# Patient Record
Sex: Female | Born: 1940
Health system: Southern US, Community
[De-identification: ages and names within clinical notes are randomized; demographics above are authoritative.]

## PROBLEM LIST (undated history)

## (undated) DIAGNOSIS — L439 Lichen planus, unspecified: Secondary | ICD-10-CM

## (undated) DIAGNOSIS — I4891 Unspecified atrial fibrillation: Secondary | ICD-10-CM

## (undated) DIAGNOSIS — E785 Hyperlipidemia, unspecified: Secondary | ICD-10-CM

## (undated) DIAGNOSIS — Z8701 Personal history of pneumonia (recurrent): Secondary | ICD-10-CM

## (undated) DIAGNOSIS — I071 Rheumatic tricuspid insufficiency: Secondary | ICD-10-CM

## (undated) DIAGNOSIS — K589 Irritable bowel syndrome without diarrhea: Secondary | ICD-10-CM

## (undated) DIAGNOSIS — N6019 Diffuse cystic mastopathy of unspecified breast: Secondary | ICD-10-CM

## (undated) DIAGNOSIS — I340 Nonrheumatic mitral (valve) insufficiency: Secondary | ICD-10-CM

## (undated) DIAGNOSIS — C801 Malignant (primary) neoplasm, unspecified: Secondary | ICD-10-CM

## (undated) HISTORY — DX: Irritable bowel syndrome, unspecified: K58.9

## (undated) HISTORY — DX: Personal history of pneumonia (recurrent): Z87.01

## (undated) HISTORY — DX: Nonrheumatic mitral (valve) insufficiency: I34.0

## (undated) HISTORY — DX: Lichen planus, unspecified: L43.9

## (undated) HISTORY — PX: ABDOMINAL HYSTERECTOMY: SHX81

## (undated) HISTORY — PX: OTHER SURGICAL HISTORY: SHX169

## (undated) HISTORY — PX: MAXILLARY SINUS LIFT: SHX5206

## (undated) HISTORY — DX: Rheumatic tricuspid insufficiency: I07.1

## (undated) HISTORY — PX: BREAST SURGERY: SHX581

## (undated) HISTORY — DX: Diffuse cystic mastopathy of unspecified breast: N60.19

## (undated) HISTORY — DX: Malignant (primary) neoplasm, unspecified: C80.1

## (undated) HISTORY — DX: Hyperlipidemia, unspecified: E78.5

---

## 1997-11-04 DIAGNOSIS — Z8701 Personal history of pneumonia (recurrent): Secondary | ICD-10-CM

## 1997-11-04 HISTORY — DX: Personal history of pneumonia (recurrent): Z87.01

## 2005-03-15 ENCOUNTER — Ambulatory Visit: Payer: Self-pay | Admitting: Unknown Physician Specialty

## 2006-03-20 ENCOUNTER — Ambulatory Visit: Payer: Self-pay | Admitting: Unknown Physician Specialty

## 2006-04-07 ENCOUNTER — Ambulatory Visit: Payer: Self-pay | Admitting: Unknown Physician Specialty

## 2006-05-08 ENCOUNTER — Ambulatory Visit: Payer: Self-pay | Admitting: Surgery

## 2006-12-12 ENCOUNTER — Ambulatory Visit: Payer: Self-pay | Admitting: Surgery

## 2007-04-13 ENCOUNTER — Ambulatory Visit: Payer: Self-pay | Admitting: Unknown Physician Specialty

## 2007-06-16 ENCOUNTER — Ambulatory Visit: Payer: Self-pay | Admitting: Unknown Physician Specialty

## 2007-08-21 ENCOUNTER — Ambulatory Visit: Payer: Self-pay | Admitting: Ophthalmology

## 2008-03-03 ENCOUNTER — Ambulatory Visit: Payer: Self-pay | Admitting: Unknown Physician Specialty

## 2008-04-13 ENCOUNTER — Ambulatory Visit: Payer: Self-pay | Admitting: Unknown Physician Specialty

## 2009-02-06 ENCOUNTER — Ambulatory Visit: Payer: Self-pay | Admitting: Unknown Physician Specialty

## 2009-04-18 ENCOUNTER — Ambulatory Visit: Payer: Self-pay | Admitting: Unknown Physician Specialty

## 2009-10-12 ENCOUNTER — Ambulatory Visit: Payer: Self-pay | Admitting: Unknown Physician Specialty

## 2010-06-08 ENCOUNTER — Ambulatory Visit: Payer: Self-pay | Admitting: Unknown Physician Specialty

## 2010-11-04 DIAGNOSIS — I071 Rheumatic tricuspid insufficiency: Secondary | ICD-10-CM

## 2010-11-04 DIAGNOSIS — I340 Nonrheumatic mitral (valve) insufficiency: Secondary | ICD-10-CM

## 2010-11-04 HISTORY — DX: Nonrheumatic mitral (valve) insufficiency: I34.0

## 2010-11-04 HISTORY — DX: Rheumatic tricuspid insufficiency: I07.1

## 2011-02-12 LAB — HM DEXA SCAN: HM Dexa Scan: NORMAL

## 2011-06-19 ENCOUNTER — Ambulatory Visit: Payer: Self-pay | Admitting: Unknown Physician Specialty

## 2011-06-19 LAB — HM MAMMOGRAPHY: HM Mammogram: NORMAL

## 2011-10-10 DIAGNOSIS — I341 Nonrheumatic mitral (valve) prolapse: Secondary | ICD-10-CM | POA: Insufficient documentation

## 2012-02-12 ENCOUNTER — Encounter: Payer: Self-pay | Admitting: Internal Medicine

## 2012-02-12 ENCOUNTER — Ambulatory Visit (INDEPENDENT_AMBULATORY_CARE_PROVIDER_SITE_OTHER): Payer: Medicare Other | Admitting: Internal Medicine

## 2012-02-12 VITALS — BP 122/68 | HR 88 | Temp 97.9°F | Resp 16 | Ht 66.5 in | Wt 169.2 lb

## 2012-02-12 DIAGNOSIS — J45909 Unspecified asthma, uncomplicated: Secondary | ICD-10-CM

## 2012-02-12 DIAGNOSIS — I4891 Unspecified atrial fibrillation: Secondary | ICD-10-CM | POA: Insufficient documentation

## 2012-02-12 DIAGNOSIS — Z1239 Encounter for other screening for malignant neoplasm of breast: Secondary | ICD-10-CM

## 2012-02-12 DIAGNOSIS — R042 Hemoptysis: Secondary | ICD-10-CM | POA: Insufficient documentation

## 2012-02-12 NOTE — Progress Notes (Signed)
Patient ID: Carol [name], female   DOB: 1941/02/10, 71 y.o.   MRN: [id number]

## 2012-02-12 NOTE — Assessment & Plan Note (Signed)
Triggered by perfume. Smoke, but not pollen.  Use of ventolin has triggered a rapid  hr in the recent past .  Treated by the past by [facility], then by [facility] again because her son the pharamacist suggested a second opinion. Dr. Jimmye Norman, then Dr. Rhys Martini

## 2012-02-12 NOTE — Assessment & Plan Note (Signed)
Will check pt/ inr today and send to [facility] ,.  episode resolved.  Advised to flush sinuses the next time it occurs.

## 2012-02-12 NOTE — Patient Instructions (Signed)
Return in  6 months for your physical.  You may come earlyier for your fasting labs if you would like   We will send your PT to Dr. Gwen Pounds

## 2012-02-13 ENCOUNTER — Encounter: Payer: Self-pay | Admitting: Internal Medicine

## 2012-02-13 LAB — PROTIME-INR: INR: 2.62 — ABNORMAL HIGH (ref <1.50)

## 2012-02-13 NOTE — Assessment & Plan Note (Signed)
And a control anticoagulate with Coumadin. Protime rechecked today given her recent episodes of hemoptysis and was therapeutic. Results will be sent to Dr. Vic Ripper. for management.

## 2012-02-13 NOTE — Progress Notes (Signed)
Patient ID: COLLINS [name], female   DOB: Aug 05, 1941, 71 y.o.   MRN: [id number]  Patient Active Problem List  Diagnoses  . Asthma, extrinsic  . Hemoptysis  . Atrial fibrillation    Subjective:  CC:   Chief Complaint  Patient presents with  . New Patient    HPI:   Carol Batty Thompsonis a 71 y.o. female who presents here to Establish primary care.  Transferring  from [employer]. She has a 7 yr history of atrial fibrillation.  Anticoagulation  and has been difficult was but her son is a Teacher, early years/pre and has strongly advised against Pradaxa for unclear reasons. .  Her son is actively involved in her care.  Pulse is rate controlled with flecainide and diltiazem .  Dr. Lolly Mustache is her EP at [name] , controlled for the past 8 years. Due for a stress test soon ,  To be ordered locally by Carol [name].   Had several episdes of scant hemoptysis which were self limiting .   uses a saline nasal irrigant once daily    History reviewed. No pertinent past medical history.  Past Surgical History  Procedure Date  . Maxillary sinus lift 1990's    Carol [name]  . Abdominal hysterectomy     precancerous cervix,    . Oophorectomy   . Breast surgery     bilateral, benign biopsies         The following portions of the patient's history were reviewed and updated as appropriate: Allergies, current medications, and problem list.    Review of Systems:   12 Pt  review of systems was negative except those addressed in the HPI,     History   Social History  . Marital Status: Single    Spouse Name: N/A    Number of Children: N/A  . Years of Education: N/A   Occupational History  . Not on file.   Social History Main Topics  . Smoking status: Never Smoker   . Smokeless tobacco: Never Used  . Alcohol Use: 2.4 oz/week    4 Glasses of wine per week  . Drug Use: No  . Sexually Active: Not on file   Other Topics Concern  . Not on file   Social History Narrative  . No narrative on  file    Objective:  BP 122/68  Pulse 88  Temp(Src) 97.9 F (36.6 C) (Oral)  Resp 16  Ht 5' 6.5" (1.689 m)  Wt 169 lb 4 oz (76.771 kg)  BMI 26.91 kg/m2  SpO2 96%  General appearance: alert, cooperative and appears stated age Ears: normal TM's and external ear canals both ears Throat: lips, mucosa, and tongue normal; teeth and gums normal Neck: no adenopathy, no carotid bruit, supple, symmetrical, trachea midline and thyroid not enlarged, symmetric, no tenderness/mass/nodules Back: symmetric, no curvature. ROM normal. No CVA tenderness. Lungs: clear to auscultation bilaterally Heart: regular rate and rhythm, S1, S2 normal, no murmur, click, rub or gallop Abdomen: soft, non-tender; bowel sounds normal; no masses,  no organomegaly Pulses: 2+ and symmetric Skin: Skin color, texture, turgor normal. No rashes or lesions Lymph nodes: Cervical, supraclavicular, and axillary nodes normal.  Assessment and Plan:  Asthma, extrinsic Triggered by perfume. Smoke, but not pollen.  Use of ventolin has triggered a rapid  hr in the recent past .  Treated by the past by Carol [name], then by Carol [name] again because her son the pharamacist suggested a second opinion. Dr. Jimmye [name], then Dr. Rhys [name]  Hemoptysis Will  check pt/ inr today and send to [name] ,.  episode resolved.  Advised to flush sinuses the next time it occurs.   Atrial fibrillation And a control anticoagulate with Coumadin. Protime rechecked today given her recent episodes of hemoptysis and was therapeutic. Results will be sent to Dr. Vic [name]. for management.    Updated Medication List Outpatient Encounter Prescriptions as of 02/12/2012  Medication Sig Dispense Refill  . albuterol (PROVENTIL HFA;VENTOLIN HFA) 108 (90 BASE) MCG/ACT inhaler Inhale 2 puffs into the lungs every 6 (six) hours as needed.      . B Complex-C (SUPER B COMPLEX PO) Take by mouth.      . Bisacodyl (LAXATIVE PO) Take by mouth.      . Calcium Carbonate-Vitamin D (CALCIUM +  D) 600-200 MG-UNIT TABS Take by mouth.      . diltiazem (CARDIZEM CD) 180 MG 24 hr capsule Take 180 mg by mouth daily.      Marland Kitchen diltiazem (CARDIZEM) 60 MG tablet Take 60 mg by mouth as needed.      . dimenhyDRINATE (DRAMAMINE) 50 MG tablet Take 50 mg by mouth every 8 (eight) hours as needed.      . diphenhydrAMINE (BENADRYL) 25 mg capsule Take 25 mg by mouth every 6 (six) hours as needed.      Marland Kitchen estradiol (ESTRACE) 1 MG tablet Take 1 mg by mouth daily.      . Flaxseed, Linseed, (FLAXSEED OIL PO) Take by mouth.      . flecainide (TAMBOCOR) 100 MG tablet Take 100 mg by mouth 2 (two) times daily.      . Glucosamine-Chondroit-Vit C-Mn (GLUCOSAMINE 1500 COMPLEX PO) Take by mouth.      Marland Kitchen guaiFENesin (MUCINEX) 600 MG 12 hr tablet Take 1,200 mg by mouth as needed.      Marland Kitchen ibuprofen (ADVIL,MOTRIN) 200 MG tablet Take 200 mg by mouth every 6 (six) hours as needed.      . metoprolol succinate (TOPROL-XL) 25 MG 24 hr tablet Take 25 mg by mouth daily.      . Multiple Vitamin (MULTIVITAMIN) tablet Take 1 tablet by mouth daily.      Marland Kitchen omeprazole (PRILOSEC) 20 MG capsule Take 20 mg by mouth daily.      . vitamin E 400 UNIT capsule Take 400 Units by mouth 2 (two) times daily.      Marland Kitchen warfarin (COUMADIN) 1 MG tablet Take 1 mg by mouth as directed. Patient takes 7 mg daily      . warfarin (COUMADIN) 6 MG tablet Take 6 mg by mouth daily.         Orders Placed This Encounter  Procedures  . MyChart Weight Flowsheet  . HM MAMMOGRAPHY  . HM DEXA SCAN  . MM Digital Screening  . Protime-INR  . INR/PT  . INR/PT  . HM COLONOSCOPY    No Follow-up on file.

## 2012-03-10 ENCOUNTER — Telehealth: Payer: Self-pay | Admitting: Internal Medicine
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
## 2012-03-11 ENCOUNTER — Encounter: Payer: Self-pay | Admitting: Internal Medicine

## 2012-03-11 ENCOUNTER — Ambulatory Visit (INDEPENDENT_AMBULATORY_CARE_PROVIDER_SITE_OTHER): Payer: Medicare Other | Admitting: Internal Medicine

## 2012-03-11 VITALS — BP 126/72 | HR 80 | Temp 98.3°F | Resp 18 | Wt 168.8 lb

## 2012-03-11 DIAGNOSIS — J45901 Unspecified asthma with (acute) exacerbation: Secondary | ICD-10-CM

## 2012-03-11 MED ORDER — AMOXICILLIN-POT CLAVULANATE 875-125 MG PO TABS: 1.0000 | ORAL_TABLET | Freq: Two times a day (BID) | ORAL | Status: AC

## 2012-03-11 MED ORDER — LEVALBUTEROL TARTRATE 45 MCG/ACT IN AERO: 1.0000 | INHALATION_SPRAY | RESPIRATORY_TRACT | Status: DC | PRN

## 2012-03-11 MED ORDER — HYDROCOD POLST-CHLORPHEN POLST 10-8 MG/5ML PO LQCR: 5.0000 mL | Freq: Every evening | ORAL | Status: DC | PRN

## 2012-03-11 MED ORDER — BUDESONIDE 180 MCG/ACT IN AEPB: 2.0000 | INHALATION_SPRAY | Freq: Two times a day (BID) | RESPIRATORY_TRACT | Status: DC
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
## 2012-03-11 NOTE — Assessment & Plan Note (Addendum)
No wheezing on exam .  Refuses prednisone .  Will rx xopenex,  Augmentin, cough suppressant. I am concerned that she may have bronchiectasis as a cause for her recurrent purulent bronchitis.  Ordering CT

## 2012-03-11 NOTE — Patient Instructions (Signed)
I am treating you with augmentin and pulmicort,  A steroid inhaler to manage the inflammation in your lungs.    I am ordering a CT of your chest to rule out a condition called bronchiectasis  I am also ordering full pulmonary  function tests once you are better.

## 2012-03-12 ENCOUNTER — Telehealth: Payer: Self-pay | Admitting: Internal Medicine

## 2012-03-12 ENCOUNTER — Encounter: Payer: Self-pay | Admitting: Internal Medicine

## 2012-03-12 MED ORDER — HYDROCOD POLST-CHLORPHEN POLST 10-8 MG/5ML PO LQCR: 5.0000 mL | Freq: Every evening | ORAL | Status: DC | PRN

## 2012-03-12 NOTE — Telephone Encounter (Signed)
[phone] Pt called she did not receive the cough med that dr Darrick Huntsman sent in yesterday [facility] on [address]

## 2012-03-12 NOTE — Telephone Encounter (Signed)
Rx called in, patient notified.

## 2012-03-13 ENCOUNTER — Encounter: Payer: Self-pay | Admitting: Internal Medicine

## 2012-03-13 DIAGNOSIS — J329 Chronic sinusitis, unspecified: Secondary | ICD-10-CM | POA: Insufficient documentation

## 2012-03-13 DIAGNOSIS — L439 Lichen planus, unspecified: Secondary | ICD-10-CM | POA: Insufficient documentation

## 2012-03-13 DIAGNOSIS — K589 Irritable bowel syndrome without diarrhea: Secondary | ICD-10-CM | POA: Insufficient documentation

## 2012-03-13 DIAGNOSIS — E785 Hyperlipidemia, unspecified: Secondary | ICD-10-CM | POA: Insufficient documentation

## 2012-03-13 DIAGNOSIS — Z8701 Personal history of pneumonia (recurrent): Secondary | ICD-10-CM | POA: Insufficient documentation

## 2012-03-13 DIAGNOSIS — N6019 Diffuse cystic mastopathy of unspecified breast: Secondary | ICD-10-CM | POA: Insufficient documentation

## 2012-03-13 DIAGNOSIS — I071 Rheumatic tricuspid insufficiency: Secondary | ICD-10-CM | POA: Insufficient documentation

## 2012-03-13 DIAGNOSIS — I340 Nonrheumatic mitral (valve) insufficiency: Secondary | ICD-10-CM | POA: Insufficient documentation

## 2012-03-18 ENCOUNTER — Ambulatory Visit: Payer: Self-pay | Admitting: Internal Medicine

## 2012-03-18 LAB — CREATININE, SERUM
Creatinine: 0.76 mg/dL (ref 0.60–1.30)
EGFR (African American): >60
EGFR (Non-African Amer.): >60

## 2012-03-20 ENCOUNTER — Telehealth: Payer: Self-pay | Admitting: Internal Medicine

## 2012-03-20 NOTE — Telephone Encounter (Signed)
Her chest CT did not show bronchiectasis ("bronky-ek tuh sis", or enlarged bronchial tubes) but she does have findings consistent with pneumonia.  Is she feeling better yet on the Augmentin?

## 2012-03-20 NOTE — Telephone Encounter (Signed)
Patient notified, she stated she is not feeling better and she gets winded easily.  She stated she had to stop using the inhaler you prescribed because she did not feel right when she used it.  She also said she is now has a productive cough and feels like it is breaking loose in her chest when she coughs

## 2012-03-20 NOTE — Telephone Encounter (Signed)
Please make her an appt for follow up this week

## 2012-03-24 NOTE — Telephone Encounter (Signed)
Patient is coming in on Friday.  

## 2012-03-25 ENCOUNTER — Encounter: Payer: Self-pay | Admitting: Internal Medicine

## 2012-03-27 ENCOUNTER — Ambulatory Visit (INDEPENDENT_AMBULATORY_CARE_PROVIDER_SITE_OTHER): Payer: Medicare Other | Admitting: Internal Medicine

## 2012-03-27 ENCOUNTER — Encounter: Payer: Self-pay | Admitting: Internal Medicine

## 2012-03-27 ENCOUNTER — Telehealth: Payer: Self-pay | Admitting: *Deleted

## 2012-03-27 VITALS — BP 102/76 | HR 114 | Temp 98.3°F | Resp 16 | Wt 169.8 lb

## 2012-03-27 DIAGNOSIS — Z79899 Other long term (current) drug therapy: Secondary | ICD-10-CM

## 2012-03-27 DIAGNOSIS — J45901 Unspecified asthma with (acute) exacerbation: Secondary | ICD-10-CM

## 2012-03-27 LAB — BASIC METABOLIC PANEL
BUN: 15 mg/dL (ref 6–23)
CO2: 24 mEq/L (ref 19–32)
Chloride: 108 mEq/L (ref 96–112)
Creatinine, Ser: 0.8 mg/dL (ref 0.4–1.2)
Glucose, Bld: 86 mg/dL (ref 70–99)
Potassium: 4.5 mEq/L (ref 3.5–5.1)

## 2012-03-27 MED ORDER — FUROSEMIDE 20 MG PO TABS: 20.0000 mg | ORAL_TABLET | Freq: Every day | ORAL | Status: DC

## 2012-03-27 NOTE — Telephone Encounter (Signed)
Message copied by Regis Bill on Fri Mar 27, 2012  5:12 PM ------      Message from: Duncan Dull      Created: Fri Mar 27, 2012  3:25 PM       Her potassium level is normal.  She should not need a potassium supplement for just a few days of furosemide,  Have her drink a glass of orange juice daily instead.
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
## 2012-03-27 NOTE — Patient Instructions (Signed)
Take an extra 60 mg of diltiazem every 12 hours until HR starts to stay < 100.  I am starting a low dose of furosemide , 20 mg daily in the morning,  To treat the fluid retention .  Take as needed

## 2012-03-27 NOTE — Telephone Encounter (Signed)
LMOM to inform patient w/contact name & number/SLS 
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
## 2012-04-15 ENCOUNTER — Telehealth: Payer: Self-pay | Admitting: Internal Medicine

## 2012-04-15 DIAGNOSIS — IMO0001 Reserved for inherently not codable concepts without codable children: Secondary | ICD-10-CM

## 2012-04-15 MED ORDER — LEVALBUTEROL TARTRATE 45 MCG/ACT IN AERO: 1.0000 | INHALATION_SPRAY | RESPIRATORY_TRACT | Status: DC | PRN

## 2012-04-15 NOTE — Telephone Encounter (Signed)
Caller: Rechy/Patient; PCP: Duncan Dull; CB#: [phone]; ; ; Call regarding Still Having SOB; onset of difficulty breathing 04/13/12.  Treated for bronchitis with Augmentin, and had CT scan a month ago.  Recently started on lasix  and pulmicort.  Feels that the lasix is not helpful.  Taking detiazem BID.  Has less reserve and cannot tolerate activity.  Cardiologist told her if it were her heart, the lasix would make a difference in her SOB, and because that continues, she beleives it may be respiratory instead.  Albuterol does help; last ventolin puffs x 2 1130 04/15/12.  Mild dry cough, but occasionally coughs up green mucus.  Albuterol is very helpful; however, it "makes her heart kick up, and then I have to take an extra cardizem regular" (takes cardizem XR at night).  Per protocol, disposition "call provider immediately"; no appts available per Epic.  INFO TO OFFICE FOR PROVIDER REVIEW/RX/CALLBACK.  WILL REQUIRE REFILL FOR VENTOLIN INHALER; DID DISCUSS XOPENEX BUT HAS DECLINED XOPENEX IN THE PAST WHEN TALKING ABOUT IT WITH DR  Darrick Huntsman.  PATIENT ALSO REQUESTS SENDING RECORDS OF RECENT TREATMENT PAST 6 MONTHS FAXED TO DR Smitty Cords [place]/[facility]IOLOG[facility].   MAY REACH PATIENT [phone].

## 2012-04-15 NOTE — Telephone Encounter (Signed)
Please inform Carol [name] that it is time for her to see a pulmonologist and I wil make referral to dr Kendrick Fries.,  In the meantime  She needs to sqitch to xopenex fro albterol because it will not make her heart race as much as the albuterol.  If dr walker cannot see her todayand if dr Kendrick Fries can't see her today she should be advised to go to urgent care since I am on vacation.  i will call the xopenex in to her pharmacy and put the referral in epic

## 2012-04-16 ENCOUNTER — Ambulatory Visit (INDEPENDENT_AMBULATORY_CARE_PROVIDER_SITE_OTHER): Payer: Medicare Other | Admitting: Pulmonary Disease

## 2012-04-16 ENCOUNTER — Encounter: Payer: Self-pay | Admitting: Pulmonary Disease

## 2012-04-16 VITALS — BP 124/86 | HR 131 | Temp 97.9°F | Ht 66.5 in | Wt 173.8 lb

## 2012-04-16 DIAGNOSIS — R0600 Dyspnea, unspecified: Secondary | ICD-10-CM

## 2012-04-16 DIAGNOSIS — R9389 Abnormal findings on diagnostic imaging of other specified body structures: Secondary | ICD-10-CM

## 2012-04-16 DIAGNOSIS — J45909 Unspecified asthma, uncomplicated: Secondary | ICD-10-CM

## 2012-04-16 DIAGNOSIS — R0602 Shortness of breath: Secondary | ICD-10-CM

## 2012-04-16 DIAGNOSIS — N6019 Diffuse cystic mastopathy of unspecified breast: Secondary | ICD-10-CM

## 2012-04-16 DIAGNOSIS — J349 Unspecified disorder of nose and nasal sinuses: Secondary | ICD-10-CM

## 2012-04-16 DIAGNOSIS — J329 Chronic sinusitis, unspecified: Secondary | ICD-10-CM

## 2012-04-16 DIAGNOSIS — R0609 Other forms of dyspnea: Secondary | ICD-10-CM

## 2012-04-16 DIAGNOSIS — I4891 Unspecified atrial fibrillation: Secondary | ICD-10-CM

## 2012-04-16 MED ORDER — DOXYCYCLINE HYCLATE 100 MG PO TABS: 100.0000 mg | ORAL_TABLET | Freq: Two times a day (BID) | ORAL | Status: AC
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
## 2012-04-16 NOTE — Assessment & Plan Note (Signed)
I think that Carol [name]'s ongoing daily sinus disease is contributing to her ongoing cough.  Because she has a complicated sinus history with prior fungal infections and surgeries as well as chronic daily sinusitis, I recommended that she see an ENT physician.  She requested that she see Dr. McQueen so we'll send the referral.  In the meantime I have recommended that she continue her saline rinses as she is doing. 

## 2012-04-16 NOTE — Patient Instructions (Addendum)
Take the doxycycline twice a day for 14 days for the cough and shortness of breath If you still have cough and sputum production after taking the doxycycline in ten days then start using the QVar two puffs twice a day We will send a referral to Dr. Mikey Bussing office for your sinus disease We will send you for a CT scan of the lungs and pulmonary function testing at [facility] after you complete the antibiotics. Follow up with Dr. Myles Rosenthal office tomorrow regarding your atrial fibrillation. We will see you back in four weeks, OK to overbook.

## 2012-04-16 NOTE — Assessment & Plan Note (Signed)
I suspect that her shortness of breath is due to an exacerbation of her underlying airways disease and the fact that her atrial fibrillation has been out of control since developing the infection.  She is not volume overloaded on exam today, but it is likely that the rapid heart rate is contributing.  See below.

## 2012-04-16 NOTE — Assessment & Plan Note (Deleted)
I think that Ms. Asfour's ongoing daily sinus disease is contributing to her ongoing cough.  Because she has a complicated sinus history with prior fungal infections and surgeries as well as chronic daily sinusitis, I recommended that she see an ENT physician.  She requested that she see Dr. Jenne Campus so we'll send the referral.  In the meantime I have recommended that she continue her saline rinses as she is doing.
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
## 2012-04-16 NOTE — Assessment & Plan Note (Signed)
Carol [name] CT chest performed at [facility] in 03/18/2012 was markedly abnormal.  I am hopeful that these findings represent pneumonia, however it is worrisome to me that they were still present after completing antibiotics.  DDx considerations include atypical infections (fungal, resistant bacterial) vs. inflammatory lung disease (NSIP, eosinophilic pneumonia, organizing pneumonia) vs. less likely a primary lung malignancy.  She does not have fever or chills to suggest ongoing pneumonia.  I will treat her with doxycycline for two weeks and then repeat a chest CT afterwards.  Hopefully we will see improvement.  If not she will need a bronchoscopy with BAL and possibly biopsy vs. EBUS approach to her mediastinal lymphadenopathy.

## 2012-04-16 NOTE — Assessment & Plan Note (Addendum)
Her atrial fibrillation was not well controlled in the office today as her heart rate was 130.  She tells me that this often occurs on nearly a daily basis, particularly when she is sick and that she has been instructed to use immediate release diltiazem to control it.  She states that she has dealt with it for so long that she rarely if ever requires hospitalization for these episodes.  I think that it could be contributing somewhat to her shortness of breath, but she is not volume up and it is more likely that the pulmonary issues are more to blame.  Regardless, she needs to discuss her elevated HR with Dr. Avelino Leeds and she says that she will be at his office in the morning.  I instructed her to take one of her immediate release tablets now in clinic.

## 2012-04-16 NOTE — Telephone Encounter (Signed)
Patient notified, she has an appt with Dr. Kendrick Fries this afternoon.  She wants to hold off on switching inhalers until she talks to Dr. Kendrick Fries because she does not think the albuterol is making her heart race.

## 2012-04-30 ENCOUNTER — Ambulatory Visit: Payer: Self-pay | Admitting: Internal Medicine

## 2012-05-05 ENCOUNTER — Telehealth: Payer: Self-pay | Admitting: Pulmonary Disease

## 2012-05-05 NOTE — Telephone Encounter (Signed)
Please advise PCC, this was ordered on 04/16/12. thanks

## 2012-05-05 NOTE — Telephone Encounter (Signed)
Returned [employer] from [facility]. Unable to reach her, but left message on voice mail that I did re-fax this order for her CT Chest without contrast to [phone]. Advised Melissa that if she did not receive order to please return my call to [phone]. CT has been approved by patient's insurance.  Confirmation received that faxed went through. Rhonda J Cobb

## 2012-05-06 ENCOUNTER — Ambulatory Visit: Payer: Self-pay | Admitting: Pulmonary Disease

## 2012-05-06 LAB — PULMONARY FUNCTION TEST

## 2012-05-11 ENCOUNTER — Telehealth: Payer: Self-pay | Admitting: *Deleted

## 2012-05-11 ENCOUNTER — Encounter: Payer: Self-pay | Admitting: Pulmonary Disease

## 2012-05-11 NOTE — Telephone Encounter (Signed)
Per Dr. Kendrick Fries, her PFT's are normal too.  Spoke with pt and notified of her results.  Per Dr. Kendrick Fries, no pulmonary followup needed. Pt aware and verbalized understanding.

## 2012-05-11 NOTE — Telephone Encounter (Signed)
Message copied by Christen Butter on Mon May 11, 2012  5:15 PM ------      Message from: Veto Kemps B      Created: Mon May 11, 2012  5:12 PM       L,            Let's let ms. Kress know that her CT scan was remarkably improved compared to her prior study.  In fact it was nearly completely normal.            B

## 2012-05-15 ENCOUNTER — Ambulatory Visit: Payer: Medicare Other | Admitting: Pulmonary Disease

## 2012-05-19 ENCOUNTER — Encounter: Payer: Self-pay | Admitting: Pulmonary Disease

## 2012-06-04 DIAGNOSIS — C801 Malignant (primary) neoplasm, unspecified: Secondary | ICD-10-CM | POA: Insufficient documentation

## 2012-06-04 HISTORY — DX: Malignant (primary) neoplasm, unspecified: C80.1

## 2012-06-04 HISTORY — PX: ABLATION OF DYSRHYTHMIC FOCUS: SHX254

## 2012-06-15 DIAGNOSIS — J449 Chronic obstructive pulmonary disease, unspecified: Secondary | ICD-10-CM | POA: Insufficient documentation

## 2012-07-17 ENCOUNTER — Ambulatory Visit: Payer: Self-pay | Admitting: Unknown Physician Specialty

## 2012-08-06 ENCOUNTER — Telehealth: Payer: Self-pay | Admitting: *Deleted

## 2012-08-06 ENCOUNTER — Other Ambulatory Visit: Payer: Medicare Other

## 2012-08-06 DIAGNOSIS — J45909 Unspecified asthma, uncomplicated: Secondary | ICD-10-CM

## 2012-08-06 NOTE — Telephone Encounter (Signed)
fasting lipids, TSH, CBC w diff and CMEt . Does she know that Medicare will not pay for labs done BEFORE her physical?

## 2012-08-06 NOTE — Telephone Encounter (Signed)
Patient came in today for CPX labs, what labs would you like ordered for this patient?

## 2012-08-06 NOTE — Telephone Encounter (Signed)
Patient was advised via telephone and she stated that she will wait to have labs done at appt.

## 2012-08-12 DIAGNOSIS — C6960 Malignant neoplasm of unspecified orbit: Secondary | ICD-10-CM | POA: Insufficient documentation

## 2012-08-13 ENCOUNTER — Ambulatory Visit (INDEPENDENT_AMBULATORY_CARE_PROVIDER_SITE_OTHER): Payer: Medicare Other | Admitting: Internal Medicine

## 2012-08-13 ENCOUNTER — Encounter: Payer: Self-pay | Admitting: Internal Medicine

## 2012-08-13 VITALS — BP 118/62 | HR 82 | Temp 97.9°F | Ht 66.5 in | Wt 174.5 lb

## 2012-08-13 DIAGNOSIS — C8591 Non-Hodgkin lymphoma, unspecified, lymph nodes of head, face, and neck: Secondary | ICD-10-CM

## 2012-08-13 DIAGNOSIS — Z0000 Encounter for general adult medical examination without abnormal findings: Secondary | ICD-10-CM

## 2012-08-13 DIAGNOSIS — I4891 Unspecified atrial fibrillation: Secondary | ICD-10-CM

## 2012-08-13 MED ORDER — ESTRADIOL 1 MG PO TABS: 1.0000 mg | ORAL_TABLET | Freq: Every day | ORAL | Status: DC

## 2012-08-13 MED ORDER — LEVALBUTEROL TARTRATE 45 MCG/ACT IN AERO: 1.0000 | INHALATION_SPRAY | RESPIRATORY_TRACT | Status: DC | PRN

## 2012-08-13 MED ORDER — DILTIAZEM HCL ER COATED BEADS 180 MG PO CP24: 180.0000 mg | ORAL_CAPSULE | Freq: Every day | ORAL | Status: DC

## 2012-08-13 NOTE — Progress Notes (Addendum)
Patient ID: Carol [name], female   DOB: 1941-06-21, 71 y.o.   MRN: [id number] The patient is here for annual Medicare wellness examination and management of other chronic and acute problems.    She has had multiple medical interventiins since last visit.  She had a cardiac ablation for uncontolled atrial fibrillation which was apparently precipitated by single use of Pulmicort inhaler in  August. The procedure was done at [facility],  By EP by  Dr. Maisie Fus after cardioversion and multiple drug trials by local cardiology failed to control symptoms of dyspnea and edema. ,  At the time of ablation Tikosyn was initiated.  Her Mg and K levels are checked and supplemented  locally by Arnoldo Hooker. Last check was last week , along with PT/IR for monitoring of chronic anticoagulation with coumadin .   Right eye became bloodshot and eyelid swollen two weeks ago,  Saw opthalmology anfter an ENT evaluation ruled out sinusitis as cause. .  She had an adverse drug reaction including development of hives to topical erythromycin and lotomax. Dr. Fransico Michael ordered CT scan which showed 2 "lesions" in her right orvital fossa and she was was referred to [facility]  With biopsiesdiagnostic of  Low grade B cell lymphomas . She was referred to Dr. Rockey Situ, [facility], who prescribed doxycyline for an empiric trial to see if lymphoma clears. PET scan scheduled for next week.   XRT and/or chemo depending on the results of the PET scan.   Regarding her screenings,  She missed her mammogram appt due to hospitalization and will reschedule mammogram at [facility]. Her  5 yr follow up colonoscopy is  due now, but given her other problems has decided to wait until the new year.  She is s/p TAH/BSO for nonmalignant reasons.   In spite of all this,  She feels great .     The risk factors are reflected in the social history.  The roster of all physicians providing medical care to patient - is listed in the Snapshot section of the  chart.  Activities of daily living:  The patient is 100% independent in all ADLs: dressing, toileting, feeding as well as independent mobility  Home safety : The patient has smoke detectors in the home. They wear seatbelts.  There are no firearms at home. There is no violence in the home.   There is no risks for hepatitis, STDs or HIV. There is no   history of blood transfusion. They have no travel history to infectious disease endemic areas of the world.  The patient has seen her dentist in the last six month. She has  seen their eye doctor in the last year. She has no hearing difficulty with regard to whispered voices and some television programs.  They have deferred audiologic testing in the last year.  They do not  have excessive sun exposure. Discussed the need for sun protection: hats, long sleeves and use of sunscreen if there is significant sun exposure.   Diet: the importance of a healthy diet is discussed. They do have a healthy diet.  The benefits of regular aerobic exercise were discussed. She has resumed her regular walking program 4 times per week ,  20 minutes.   Depression screen: there are no signs or vegative symptoms of depression- irritability, change in appetite, anhedonia, sadness/tearfullness.  Cognitive assessment: the patient manages all their financial and personal affairs and is actively engaged. They could relate day,date,year and events; recalled 2/3 objects at 3 minutes; performed  clock-face test normally.  The following portions of the patient's history were reviewed and updated as appropriate: allergies, current medications, past family history, past medical history,  past surgical history, past social history  and problem list.  Visual acuity was not assessed per patient preference since she has regular follow up with her ophthalmologist. Hearing and body mass index were assessed and reviewed.   During the course of the visit the patient was educated and  counseled about appropriate screening and preventive services including : fall prevention , diabetes screening, nutrition counseling, colorectal cancer screening, and recommended immunizations.    BP 118/62  Pulse 82  Temp 97.9 F (36.6 C) (Oral)  Ht 5' 6.5" (1.689 m)  Wt 174 lb 8 oz (79.153 kg)  BMI 27.74 kg/m2  SpO2 94%  General appearance: alert, cooperative and appears stated age Ears: normal TM's and external ear canals both ears Throat: lips, mucosa, and tongue normal; teeth and gums normal Neck: no adenopathy, no carotid bruit, supple, symmetrical, trachea midline and thyroid not enlarged, symmetric, no tenderness/mass/nodules Back: symmetric, no curvature. ROM normal. No CVA tenderness. Breasts: breasts appear normal, no suspicious masses, no skin or nipple changes or axillary nodes. Lungs: clear to auscultation bilaterally Heart: regular rate and rhythm, S1, S2 normal, no murmur, click, rub or gallop Abdomen: soft, non-tender; bowel sounds normal; no masses,  no organomegaly Pulses: 2+ and symmetric Skin: Skin color, texture, turgor normal. No rashes or lesions Lymph nodes: Cervical, supraclavicular, and axillary nodes normal.

## 2012-08-15 ENCOUNTER — Encounter: Payer: Self-pay | Admitting: Internal Medicine

## 2012-08-15 DIAGNOSIS — C8591 Non-Hodgkin lymphoma, unspecified, lymph nodes of head, face, and neck: Secondary | ICD-10-CM | POA: Insufficient documentation

## 2012-08-15 DIAGNOSIS — I4891 Unspecified atrial fibrillation: Secondary | ICD-10-CM | POA: Insufficient documentation

## 2012-08-15 NOTE — Assessment & Plan Note (Signed)
Apparently part precipitated by use of Pulmicort inhaler. She is now status post ablation and managed to keep this by Dr. Arnoldo Hooker of and Dr. Maisie Fus at [facility] cardiology.  She is anticoagulated with Coumadin and Dr. Gwen Pounds is following her protimes.

## 2012-08-15 NOTE — Assessment & Plan Note (Signed)
Diagnosed by recent biopsy of 2 lesions noted in the right orbital fossa during CT scan done by Dr. Brennan for preorbital start cellulitis.  Diagnosis and treatment is being managed by Dr. Brothers at [facility] oncology. PET scan scheduled for next week.   

## 2012-12-21 ENCOUNTER — Other Ambulatory Visit: Payer: Self-pay | Admitting: *Deleted

## 2012-12-21 MED ORDER — OMEPRAZOLE 20 MG PO CPDR: 20.0000 mg | DELAYED_RELEASE_CAPSULE | Freq: Every day | ORAL | Status: DC

## 2012-12-21 NOTE — Telephone Encounter (Signed)
Med filled.  

## 2013-01-30 ENCOUNTER — Other Ambulatory Visit: Payer: Self-pay | Admitting: Internal Medicine

## 2013-02-08 ENCOUNTER — Telehealth: Payer: Self-pay | Admitting: Internal Medicine

## 2013-02-08 ENCOUNTER — Other Ambulatory Visit: Payer: Self-pay | Admitting: Internal Medicine

## 2013-02-08 MED ORDER — OMEPRAZOLE 20 MG PO CPDR: 20.0000 mg | DELAYED_RELEASE_CAPSULE | Freq: Every day | ORAL | Status: DC

## 2013-02-08 NOTE — Telephone Encounter (Signed)
Pt needing refill on omeprazole, asking for 90 day supply.  [facility].

## 2013-02-08 NOTE — Telephone Encounter (Signed)
RX sent to pharmacy as instructed. Patient notified.

## 2013-02-24 DIAGNOSIS — K219 Gastro-esophageal reflux disease without esophagitis: Secondary | ICD-10-CM | POA: Insufficient documentation

## 2013-02-25 DIAGNOSIS — E041 Nontoxic single thyroid nodule: Secondary | ICD-10-CM | POA: Insufficient documentation

## 2013-07-19 ENCOUNTER — Other Ambulatory Visit: Payer: Self-pay | Admitting: *Deleted

## 2013-07-19 MED ORDER — OMEPRAZOLE 20 MG PO CPDR: 20.0000 mg | DELAYED_RELEASE_CAPSULE | Freq: Every day | ORAL | Status: DC

## 2013-10-25 ENCOUNTER — Other Ambulatory Visit: Payer: Self-pay | Admitting: *Deleted

## 2013-10-25 DIAGNOSIS — E785 Hyperlipidemia, unspecified: Secondary | ICD-10-CM

## 2013-10-25 DIAGNOSIS — Z79890 Hormone replacement therapy (postmenopausal): Secondary | ICD-10-CM

## 2013-10-25 DIAGNOSIS — Z79899 Other long term (current) drug therapy: Secondary | ICD-10-CM

## 2013-10-25 DIAGNOSIS — R5381 Other malaise: Secondary | ICD-10-CM

## 2013-10-25 NOTE — Telephone Encounter (Signed)
Last visit 08/13/12, refill or appointment first?

## 2013-10-26 MED ORDER — ESTRADIOL 1 MG PO TABS: 1.0000 mg | ORAL_TABLET | Freq: Every day | ORAL | Status: DC

## 2013-10-26 NOTE — Telephone Encounter (Signed)
Refilled for 30 days only.  She Has not been seen in one year so she needs fasting labs followed by  a  30 minute visit.

## 2013-10-26 NOTE — Telephone Encounter (Signed)
Pt notified. Call given to Lupita Leash to schedule appointments.

## 2013-11-01 ENCOUNTER — Other Ambulatory Visit: Payer: Medicare Other

## 2013-11-02 ENCOUNTER — Other Ambulatory Visit (INDEPENDENT_AMBULATORY_CARE_PROVIDER_SITE_OTHER): Payer: Medicare Other

## 2013-11-02 ENCOUNTER — Encounter (INDEPENDENT_AMBULATORY_CARE_PROVIDER_SITE_OTHER): Payer: Self-pay

## 2013-11-02 DIAGNOSIS — E785 Hyperlipidemia, unspecified: Secondary | ICD-10-CM

## 2013-11-02 DIAGNOSIS — Z79899 Other long term (current) drug therapy: Secondary | ICD-10-CM

## 2013-11-02 DIAGNOSIS — R5381 Other malaise: Secondary | ICD-10-CM

## 2013-11-02 LAB — CBC WITH DIFFERENTIAL/PLATELET
Basophils Absolute: 0 10*3/uL (ref 0.0–0.1)
Basophils Relative: 0.4 % (ref 0.0–3.0)
Eosinophils Absolute: 0.2 10*3/uL (ref 0.0–0.7)
Hemoglobin: 13 g/dL (ref 12.0–15.0)
Lymphocytes Relative: 22 % (ref 12.0–46.0)
MCHC: 34.3 g/dL (ref 30.0–36.0)
MCV: 97.3 fl (ref 78.0–100.0)
Monocytes Absolute: 0.5 10*3/uL (ref 0.1–1.0)
Neutro Abs: 2.1 10*3/uL (ref 1.4–7.7)
RBC: 3.9 Mil/uL (ref 3.87–5.11)
RDW: 14.7 % — ABNORMAL HIGH (ref 11.5–14.6)

## 2013-11-02 LAB — COMPREHENSIVE METABOLIC PANEL
ALT: 28 U/L (ref 0–35)
AST: 26 U/L (ref 0–37)
Albumin: 4 g/dL (ref 3.5–5.2)
Alkaline Phosphatase: 61 U/L (ref 39–117)
Calcium: 9.1 mg/dL (ref 8.4–10.5)
Chloride: 106 mEq/L (ref 96–112)
Creatinine, Ser: 0.7 mg/dL (ref 0.4–1.2)
Potassium: 4.7 mEq/L (ref 3.5–5.1)

## 2013-11-02 LAB — LIPID PANEL: HDL: 68.8 mg/dL (ref >39.00)

## 2013-11-02 LAB — TSH: TSH: 1.79 u[IU]/mL (ref 0.35–5.50)

## 2013-11-02 LAB — LDL CHOLESTEROL, DIRECT: Direct LDL: 130.8 mg/dL

## 2013-11-05 ENCOUNTER — Encounter: Payer: Self-pay | Admitting: Internal Medicine

## 2013-11-05 ENCOUNTER — Ambulatory Visit (INDEPENDENT_AMBULATORY_CARE_PROVIDER_SITE_OTHER): Payer: Medicare Other | Admitting: Internal Medicine

## 2013-11-05 VITALS — BP 122/70 | HR 75 | Temp 98.6°F | Wt 175.0 lb

## 2013-11-05 DIAGNOSIS — I4891 Unspecified atrial fibrillation: Secondary | ICD-10-CM

## 2013-11-05 DIAGNOSIS — C8591 Non-Hodgkin lymphoma, unspecified, lymph nodes of head, face, and neck: Secondary | ICD-10-CM

## 2013-11-05 DIAGNOSIS — Z79890 Hormone replacement therapy (postmenopausal): Secondary | ICD-10-CM

## 2013-11-05 DIAGNOSIS — Z23 Encounter for immunization: Secondary | ICD-10-CM | POA: Diagnosis not present

## 2013-11-05 DIAGNOSIS — C8581 Other specified types of non-Hodgkin lymphoma, lymph nodes of head, face, and neck: Secondary | ICD-10-CM

## 2013-11-05 DIAGNOSIS — E785 Hyperlipidemia, unspecified: Secondary | ICD-10-CM

## 2013-11-05 MED ORDER — ESTRADIOL 1 MG PO TABS: 1.0000 mg | ORAL_TABLET | Freq: Every day | ORAL | Status: DC

## 2013-11-05 MED ORDER — FUROSEMIDE 20 MG PO TABS: ORAL_TABLET | ORAL | Status: DC

## 2013-11-05 MED ORDER — ALBUTEROL SULFATE HFA 108 (90 BASE) MCG/ACT IN AERS: 2.0000 | INHALATION_SPRAY | Freq: Four times a day (QID) | RESPIRATORY_TRACT | Status: DC | PRN

## 2013-11-05 MED ORDER — MAGNESIUM 400 MG PO CAPS: 1.0000 | ORAL_CAPSULE | Freq: Two times a day (BID) | ORAL | Status: DC

## 2013-11-05 MED ORDER — METOPROLOL TARTRATE 50 MG PO TABS: 50.0000 mg | ORAL_TABLET | Freq: Two times a day (BID) | ORAL | Status: DC

## 2013-11-05 MED ORDER — OMEPRAZOLE 20 MG PO CPDR: 20.0000 mg | DELAYED_RELEASE_CAPSULE | Freq: Every day | ORAL | Status: DC

## 2013-11-05 MED ORDER — DILTIAZEM HCL 60 MG PO TABS: 60.0000 mg | ORAL_TABLET | ORAL | Status: DC | PRN

## 2013-11-05 NOTE — Progress Notes (Signed)
Patient ID: Carol [name], female   DOB: [birth year], 73 y.o.   MRN: [id number]    Patient Active Problem List   Diagnosis Date Noted  . Lymphoma of lymph nodes of head, face, and/or neck 08/15/2012  . Atrial fibrillation with rapid ventricular response 08/15/2012  . lymphoma 06/04/2012  . Abnormal chest CT 04/16/2012  . Lichen planus   . Irritable bowel syndrome   . Moderate mitral insufficiency   . Mild tricuspid insufficiency   . Hyperlipidemia   . Asthma exacerbation, mild 03/11/2012  . Asthma, extrinsic 02/12/2012    Subjective:  CC:   Chief Complaint  Patient presents with  . Follow-up    HPI:   Carol Marquess Thompsonis a 73 y.o. female who presents for one year follow  Up on chronic conditions,  Last seen over One year ago. 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  Managed by [facility].  Currently on broad spectrum abx with  clinda and augmentin since dec 2nd for empiric treatment of presumed sinus abscess.   PET scan was done and concerning for another lymphoma or abscess in the  Orbital sinuses. Getting chemo,  And has been gaining weight due to Craving carbohydrates helps the nausea and settle her stomach   Taking a probiotic  Daily.  Occasional loose stools, which are welcome  Wants to get another  pneumonovax        Past Medical History  Diagnosis Date  . History of pneumonia 1999  . Lichen planus   . Irritable bowel syndrome   . Chronic sinusitis   . Moderate mitral insufficiency JAN 2012    ECHO, [place]  . Mild tricuspid insufficiency Jan 2012    ECHO, [place]  . Hyperlipidemia   . Fibrocystic breast disease   . lymphoma August 2013    Low grade B cell    Past Surgical History  Procedure Laterality Date  . Maxillary sinus lift  1990's    [name]  . Oophorectomy    . Breast surgery      bilateral, benign biopsies  . Abdominal hysterectomy      precancerous cervix,    . Ablation of  dysrhythmic focus  August 2013    [facility], Dr. Marcello Moores       The following portions of the patient's history were reviewed and updated as appropriate: Allergies, current medications, and problem list.    Review of Systems:   12 Pt  review of systems was negative except those addressed in the HPI,     History   Social History  . Marital Status: Single    Spouse Name: N/A    Number of Children: N/A  . Years of Education: N/A   Occupational History  . Not on file.   Social History Main Topics  . Smoking status: Never Smoker   . Smokeless tobacco: Never Used  . Alcohol Use: 2.4 oz/week    4 Glasses of wine per week  . Drug Use: No  . Sexual Activity: Not on file   Other Topics Concern  . Not on file   Social History Narrative  . No narrative on file    Objective:  Filed Vitals:   11/05/13 0903  BP: 122/70  Pulse: 75  Temp: 98.6 F (37 C)     General appearance: alert, cooperative and appears stated age Ears: normal TM's and external ear canals both ears Throat: lips, mucosa, and  tongue normal; teeth and gums normal Neck: no adenopathy, no carotid bruit, supple, symmetrical, trachea midline and thyroid not enlarged, symmetric, no tenderness/mass/nodules Back: symmetric, no curvature. ROM normal. No CVA tenderness. Lungs: clear to auscultation bilaterally Heart: regular rate and rhythm, S1, S2 normal, no murmur, click, rub or gallop Abdomen: soft, non-tender; bowel sounds normal; no masses,  no organomegaly Pulses: 2+ and symmetric Skin: Skin color, texture, turgor normal. No rashes or lesions Lymph nodes: Cervical, supraclavicular, and axillary nodes normal.  Assessment and Plan:  Atrial fibrillation with rapid ventricular response S/p ablation  , now managed with Tikosyn.   Hyperlipidemia Mild, untreated currently due to concurrent chemotherapy and abx therapy.   Lymphoma of lymph nodes of head, face, and/or neck Diagnosed by recent biopsy of 2  lesions noted in the right orbital fossa during CT scan done by Dr. Tobe Sos for preorbital cellulitis.  Diagnosis and treatment is being managed by Dr. Purcell Mouton at [facility] oncology.      Updated Medication List Outpatient Encounter Prescriptions as of 11/05/2013  Medication Sig  . albuterol (PROVENTIL HFA;VENTOLIN HFA) 108 (90 BASE) MCG/ACT inhaler Inhale 2 puffs into the lungs every 6 (six) hours as needed.  . Bisacodyl (LAXATIVE PO) Take 1 tablet by mouth daily as needed.   . diltiazem (CARDIZEM) 60 MG tablet Take 1 tablet (60 mg total) by mouth as needed.  . diphenhydrAMINE (BENADRYL) 25 mg capsule Take 25 mg by mouth every 6 (six) hours as needed.  . dofetilide (TIKOSYN) 250 MCG capsule Take 250 mcg by mouth 2 (two) times daily.  Marland Kitchen estradiol (ESTRACE) 1 MG tablet Take 1 tablet (1 mg total) by mouth daily.  . furosemide (LASIX) 20 MG tablet TAKE ONE TABLET BY MOUTH EVERY DAY  . ibuprofen (ADVIL,MOTRIN) 200 MG tablet Take 200 mg by mouth every 6 (six) hours as needed.  . Magnesium 400 MG CAPS Take 1 capsule by mouth 2 (two) times daily.  . metoprolol (LOPRESSOR) 50 MG tablet Take 1 tablet (50 mg total) by mouth 2 (two) times daily.  Marland Kitchen omeprazole (PRILOSEC) 20 MG capsule Take 1 capsule (20 mg total) by mouth daily.  Marland Kitchen warfarin (COUMADIN) 6 MG tablet Take 6 mg by mouth daily.  . [DISCONTINUED] albuterol (PROVENTIL HFA;VENTOLIN HFA) 108 (90 BASE) MCG/ACT inhaler Inhale 2 puffs into the lungs every 6 (six) hours as needed.  . [DISCONTINUED] diltiazem (CARDIZEM) 60 MG tablet Take 60 mg by mouth as needed.  . [DISCONTINUED] estradiol (ESTRACE) 1 MG tablet Take 1 tablet (1 mg total) by mouth daily.  . [DISCONTINUED] furosemide (LASIX) 20 MG tablet TAKE ONE TABLET BY MOUTH EVERY DAY  . [DISCONTINUED] Magnesium 400 MG CAPS Take 1 capsule by mouth 2 (two) times daily.  . [DISCONTINUED] metoprolol (LOPRESSOR) 50 MG tablet Take 50 mg by mouth 2 (two) times daily.  . [DISCONTINUED] Multiple Vitamin  (MULTIVITAMIN) tablet Take 1 tablet by mouth daily.  . [DISCONTINUED] omeprazole (PRILOSEC) 20 MG capsule Take 1 capsule (20 mg total) by mouth daily.  . [DISCONTINUED] vitamin E 400 UNIT capsule Take 400 Units by mouth 2 (two) times daily.  . [DISCONTINUED] B Complex-C (SUPER B COMPLEX PO) Take 1 tablet by mouth daily.   . [DISCONTINUED] Calcium Carbonate-Vitamin D (CALCIUM + D) 600-200 MG-UNIT TABS Take 1 tablet by mouth daily.   . [DISCONTINUED] chlorpheniramine-HYDROcodone (TUSSIONEX) 10-8 MG/5ML LQCR Take 5 mLs by mouth at bedtime as needed.  . [DISCONTINUED] diltiazem (CARDIZEM CD) 180 MG 24 hr capsule Take 1 capsule (180 mg  total) by mouth daily.  . [DISCONTINUED] doxycycline (VIBRA-TABS) 100 MG tablet Take 100 mg by mouth 2 (two) times daily.  . [DISCONTINUED] Flaxseed, Linseed, (FLAXSEED OIL PO) Take 1 capsule by mouth daily.   . [DISCONTINUED] flecainide (TAMBOCOR) 100 MG tablet Take 100 mg by mouth 2 (two) times daily.  . [DISCONTINUED] furosemide (LASIX) 20 MG tablet TAKE ONE TABLET BY MOUTH EVERY DAY  . [DISCONTINUED] Glucosamine-Chondroit-Vit C-Mn (GLUCOSAMINE 1500 COMPLEX PO) Take by mouth.  . [DISCONTINUED] guaiFENesin (MUCINEX) 600 MG 12 hr tablet Take 1,200 mg by mouth as needed.  . [DISCONTINUED] warfarin (COUMADIN) 1 MG tablet Take 1 mg by mouth as directed. Patient takes 7 mg daily     Orders Placed This Encounter  Procedures  . Pneumococcal polysaccharide vaccine 23-valent greater than or equal to 2yo subcutaneous/IM    No Follow-up on file.

## 2013-11-05 NOTE — Progress Notes (Signed)
Pre-visit discussion using our clinic review tool. No additional management support is needed unless otherwise documented below in the visit note.  

## 2013-11-07 NOTE — Assessment & Plan Note (Signed)
Diagnosed by recent biopsy of 2 lesions noted in the right orbital fossa during CT scan done by Dr. Tobe Sos for preorbital cellulitis.  Diagnosis and treatment is being managed by Dr. Purcell Mouton at [facility] oncology.

## 2013-11-07 NOTE — Assessment & Plan Note (Signed)
Mild, untreated currently due to concurrent chemotherapy and abx therapy.

## 2013-11-07 NOTE — Assessment & Plan Note (Signed)
S/p ablation  , now managed with Tikosyn.

## 2013-11-08 DIAGNOSIS — J019 Acute sinusitis, unspecified: Secondary | ICD-10-CM | POA: Diagnosis not present

## 2013-11-08 DIAGNOSIS — C8581 Other specified types of non-Hodgkin lymphoma, lymph nodes of head, face, and neck: Secondary | ICD-10-CM | POA: Diagnosis not present

## 2013-11-17 ENCOUNTER — Telehealth: Payer: Self-pay | Admitting: Internal Medicine

## 2013-11-17 DIAGNOSIS — Z79890 Hormone replacement therapy (postmenopausal): Secondary | ICD-10-CM

## 2013-11-17 DIAGNOSIS — I4891 Unspecified atrial fibrillation: Secondary | ICD-10-CM | POA: Diagnosis not present

## 2013-11-17 NOTE — Telephone Encounter (Signed)
The patient is needing all the medications that were sent to [place] called to her mail order pharmacy for a 90 day supply/

## 2013-11-18 MED ORDER — ESTRADIOL 1 MG PO TABS: 1.0000 mg | ORAL_TABLET | Freq: Every day | ORAL | Status: DC

## 2013-11-18 NOTE — Telephone Encounter (Signed)
Called patient to verify pharmacy medications from 1/2/115 sent to wrong pharmacy patient needs these scripts sent to mail order. Recent medication as requested the Estradiol.

## 2013-11-23 DIAGNOSIS — T1510XA Foreign body in conjunctival sac, unspecified eye, initial encounter: Secondary | ICD-10-CM | POA: Diagnosis not present

## 2013-12-01 DIAGNOSIS — I4891 Unspecified atrial fibrillation: Secondary | ICD-10-CM | POA: Diagnosis not present

## 2013-12-09 ENCOUNTER — Telehealth: Payer: Self-pay | Admitting: Internal Medicine

## 2013-12-09 DIAGNOSIS — Z79890 Hormone replacement therapy (postmenopausal): Secondary | ICD-10-CM

## 2013-12-09 NOTE — Telephone Encounter (Signed)
Pt called stating the mail order for [facility] told her she needs prior P[place]or Curia  [phone]  Needs refill on estrol

## 2013-12-10 NOTE — Telephone Encounter (Signed)
Can you help with prior Authorization Please?

## 2013-12-16 DIAGNOSIS — I4891 Unspecified atrial fibrillation: Secondary | ICD-10-CM | POA: Diagnosis not present

## 2013-12-16 DIAGNOSIS — I519 Heart disease, unspecified: Secondary | ICD-10-CM | POA: Diagnosis not present

## 2013-12-16 DIAGNOSIS — Z7901 Long term (current) use of anticoagulants: Secondary | ICD-10-CM | POA: Diagnosis not present

## 2013-12-16 DIAGNOSIS — C8589 Other specified types of non-Hodgkin lymphoma, extranodal and solid organ sites: Secondary | ICD-10-CM | POA: Diagnosis not present

## 2013-12-16 DIAGNOSIS — J329 Chronic sinusitis, unspecified: Secondary | ICD-10-CM | POA: Diagnosis not present

## 2013-12-23 NOTE — Telephone Encounter (Signed)
Form is being faxed over now

## 2013-12-29 DIAGNOSIS — I4891 Unspecified atrial fibrillation: Secondary | ICD-10-CM | POA: Diagnosis not present

## 2013-12-31 ENCOUNTER — Telehealth: Payer: Self-pay | Admitting: Internal Medicine

## 2013-12-31 MED ORDER — OMEPRAZOLE 20 MG PO CPDR: 20.0000 mg | DELAYED_RELEASE_CAPSULE | Freq: Every day | ORAL | Status: DC

## 2013-12-31 NOTE — Telephone Encounter (Signed)
Pt called she having problems getting her mail order rx Needs rx  For omeprazole Needs ok for this [facility] mail order [phone] Pt has 1 week left

## 2013-12-31 NOTE — Telephone Encounter (Signed)
Refill sent as requested. 

## 2014-01-04 MED ORDER — ESTRADIOL 1 MG PO TABS: 1.0000 mg | ORAL_TABLET | Freq: Every day | ORAL | Status: DC

## 2014-01-04 NOTE — Telephone Encounter (Signed)
Refill sent as requested. 

## 2014-01-04 NOTE — Telephone Encounter (Signed)
Patient came into the office today to inform nurse that she is needing her Estradiol filled by her mail order company . She left her information for her mail order and local pharmacy. The paper was put in Dr. Lupita Dawn box.

## 2014-01-07 DIAGNOSIS — I4891 Unspecified atrial fibrillation: Secondary | ICD-10-CM | POA: Diagnosis not present

## 2014-01-07 DIAGNOSIS — Z7901 Long term (current) use of anticoagulants: Secondary | ICD-10-CM | POA: Diagnosis not present

## 2014-01-07 DIAGNOSIS — I10 Essential (primary) hypertension: Secondary | ICD-10-CM | POA: Diagnosis not present

## 2014-01-11 ENCOUNTER — Telehealth: Payer: Self-pay | Admitting: *Deleted

## 2014-01-11 NOTE — Telephone Encounter (Signed)
Patient wants PA for estradiol tablet

## 2014-01-12 NOTE — Telephone Encounter (Signed)
Called Pa for patient today for Estradiol 1 mg tablet insurance states will take 24 -72 hours for review. Left message for patient to call office to advise,

## 2014-01-13 NOTE — Telephone Encounter (Signed)
Patient received message that I have contacted [employer] concerning Estradiol 1 mg tab and that we are awaiting a decision from insurance FYI.

## 2014-01-26 DIAGNOSIS — I4891 Unspecified atrial fibrillation: Secondary | ICD-10-CM | POA: Diagnosis not present

## 2014-02-07 DIAGNOSIS — I4891 Unspecified atrial fibrillation: Secondary | ICD-10-CM | POA: Diagnosis not present

## 2014-02-11 ENCOUNTER — Telehealth: Payer: Self-pay | Admitting: Internal Medicine

## 2014-02-11 DIAGNOSIS — Z79890 Hormone replacement therapy (postmenopausal): Secondary | ICD-10-CM

## 2014-02-11 MED ORDER — ESTRADIOL 1 MG PO TABS: 1.0000 mg | ORAL_TABLET | Freq: Every day | ORAL | Status: DC

## 2014-02-11 NOTE — Telephone Encounter (Signed)
Script placed up front °

## 2014-02-11 NOTE — Telephone Encounter (Signed)
Pt called wanting to get a hand written rx for estradiol  Pt stated she was having problems with insurance paying she is going to pay for this out of pocket Please call when rx is ready

## 2014-02-11 NOTE — Telephone Encounter (Signed)
Printed script

## 2014-02-21 DIAGNOSIS — I4891 Unspecified atrial fibrillation: Secondary | ICD-10-CM | POA: Diagnosis not present

## 2014-03-15 DIAGNOSIS — C8581 Other specified types of non-Hodgkin lymphoma, lymph nodes of head, face, and neck: Secondary | ICD-10-CM | POA: Diagnosis not present

## 2014-03-15 DIAGNOSIS — Z7901 Long term (current) use of anticoagulants: Secondary | ICD-10-CM | POA: Diagnosis not present

## 2014-03-15 DIAGNOSIS — I4891 Unspecified atrial fibrillation: Secondary | ICD-10-CM | POA: Diagnosis not present

## 2014-03-15 DIAGNOSIS — C8589 Other specified types of non-Hodgkin lymphoma, extranodal and solid organ sites: Secondary | ICD-10-CM | POA: Diagnosis not present

## 2014-03-21 DIAGNOSIS — I4891 Unspecified atrial fibrillation: Secondary | ICD-10-CM | POA: Diagnosis not present

## 2014-03-29 DIAGNOSIS — I4891 Unspecified atrial fibrillation: Secondary | ICD-10-CM | POA: Diagnosis not present

## 2014-03-30 DIAGNOSIS — C8589 Other specified types of non-Hodgkin lymphoma, extranodal and solid organ sites: Secondary | ICD-10-CM | POA: Diagnosis not present

## 2014-03-30 DIAGNOSIS — C6960 Malignant neoplasm of unspecified orbit: Secondary | ICD-10-CM | POA: Diagnosis not present

## 2014-03-30 DIAGNOSIS — C8581 Other specified types of non-Hodgkin lymphoma, lymph nodes of head, face, and neck: Secondary | ICD-10-CM | POA: Diagnosis not present

## 2014-04-21 DIAGNOSIS — I5042 Chronic combined systolic (congestive) and diastolic (congestive) heart failure: Secondary | ICD-10-CM | POA: Diagnosis not present

## 2014-04-21 DIAGNOSIS — I499 Cardiac arrhythmia, unspecified: Secondary | ICD-10-CM | POA: Diagnosis not present

## 2014-04-21 DIAGNOSIS — I059 Rheumatic mitral valve disease, unspecified: Secondary | ICD-10-CM | POA: Diagnosis not present

## 2014-04-21 DIAGNOSIS — I4891 Unspecified atrial fibrillation: Secondary | ICD-10-CM | POA: Diagnosis not present

## 2014-04-22 DIAGNOSIS — I5042 Chronic combined systolic (congestive) and diastolic (congestive) heart failure: Secondary | ICD-10-CM | POA: Diagnosis not present

## 2014-04-22 DIAGNOSIS — I509 Heart failure, unspecified: Secondary | ICD-10-CM | POA: Diagnosis not present

## 2014-04-22 DIAGNOSIS — I4891 Unspecified atrial fibrillation: Secondary | ICD-10-CM | POA: Diagnosis not present

## 2014-04-25 DIAGNOSIS — I498 Other specified cardiac arrhythmias: Secondary | ICD-10-CM | POA: Diagnosis not present

## 2014-04-25 DIAGNOSIS — C8589 Other specified types of non-Hodgkin lymphoma, extranodal and solid organ sites: Secondary | ICD-10-CM | POA: Diagnosis not present

## 2014-04-25 DIAGNOSIS — Z9889 Other specified postprocedural states: Secondary | ICD-10-CM | POA: Diagnosis not present

## 2014-04-25 DIAGNOSIS — Z01818 Encounter for other preprocedural examination: Secondary | ICD-10-CM | POA: Diagnosis not present

## 2014-04-25 DIAGNOSIS — J45909 Unspecified asthma, uncomplicated: Secondary | ICD-10-CM | POA: Diagnosis not present

## 2014-04-25 DIAGNOSIS — J329 Chronic sinusitis, unspecified: Secondary | ICD-10-CM | POA: Diagnosis not present

## 2014-04-25 DIAGNOSIS — I4891 Unspecified atrial fibrillation: Secondary | ICD-10-CM | POA: Diagnosis not present

## 2014-04-25 DIAGNOSIS — C8581 Other specified types of non-Hodgkin lymphoma, lymph nodes of head, face, and neck: Secondary | ICD-10-CM | POA: Diagnosis not present

## 2014-04-25 DIAGNOSIS — Z419 Encounter for procedure for purposes other than remedying health state, unspecified: Secondary | ICD-10-CM | POA: Diagnosis not present

## 2014-04-28 DIAGNOSIS — I059 Rheumatic mitral valve disease, unspecified: Secondary | ICD-10-CM | POA: Diagnosis not present

## 2014-04-28 DIAGNOSIS — I4891 Unspecified atrial fibrillation: Secondary | ICD-10-CM | POA: Diagnosis not present

## 2014-04-28 DIAGNOSIS — I5042 Chronic combined systolic (congestive) and diastolic (congestive) heart failure: Secondary | ICD-10-CM | POA: Diagnosis not present

## 2014-04-28 DIAGNOSIS — I499 Cardiac arrhythmia, unspecified: Secondary | ICD-10-CM | POA: Diagnosis not present

## 2014-05-07 LAB — HM MAMMOGRAPHY

## 2014-05-09 ENCOUNTER — Ambulatory Visit: Payer: No Typology Code available for payment source | Admitting: Internal Medicine

## 2014-05-16 ENCOUNTER — Ambulatory Visit (INDEPENDENT_AMBULATORY_CARE_PROVIDER_SITE_OTHER): Payer: Medicare Other | Admitting: Internal Medicine

## 2014-05-16 ENCOUNTER — Encounter: Payer: Self-pay | Admitting: Internal Medicine

## 2014-05-16 VITALS — BP 116/72 | HR 65 | Temp 98.1°F | Resp 18 | Ht 66.5 in | Wt 172.5 lb

## 2014-05-16 DIAGNOSIS — Z0000 Encounter for general adult medical examination without abnormal findings: Secondary | ICD-10-CM | POA: Diagnosis not present

## 2014-05-16 DIAGNOSIS — Z1239 Encounter for other screening for malignant neoplasm of breast: Secondary | ICD-10-CM | POA: Diagnosis not present

## 2014-05-16 NOTE — Progress Notes (Signed)
Pre-visit discussion using our clinic review tool. No additional management support is needed unless otherwise documented below in the visit note.  

## 2014-05-16 NOTE — Progress Notes (Signed)
Patient ID: Carol [name], female   DOB: 01-Sep-1941, 73 y.o.   MRN: [id number]  The patient is here for annual Medicare wellness examination and management of other chronic and acute problems including IBS, hyperlipidemia, atrial fibrillation and asthma.  She has been diagnosed with lymphoma involving the head and neck and has been under treatment by ENT and ENT oncology at [facility].  She is scheduled to have Sinus surgery on Thursay at [facility] for management of recurrent lymphoma which has been attributed to persistent maxillary sinusitis .  Her care is managed by Dr Lonia Chimera and Dr Inocente Salles.  Her coumadin has ben suspended and will be resumed pending surgical outcome and any complications.    Weight gain/overweight:  She gained weight during last chemotherapy round and despite diligent daily exercise and  carbohydrate restriction which included avoidance of alcohol,  acutally gained a pound much to her dismay. She has finally regained her sense of taste after the chemo and XRT .  Did not get a mammogram last year to due chemotherapy. Up to date on colonoscopy.     The risk factors are reflected in the social history.  The roster of all physicians providing medical care to patient - is listed in the Snapshot section of the chart.  Activities of daily living:  The patient is 100% independent in all ADLs: dressing, toileting, feeding as well as independent mobility  Home safety : The patient has smoke detectors in the home. They wear seatbelts.  There are no firearms at home. There is no violence in the home.   There is no risks for hepatitis, STDs or HIV. There is no   history of blood transfusion. They have no travel history to infectious disease endemic areas of the world.  The patient has seen their dentist in the last six month. They have seen their eye doctor in the last year. They admit to slight hearing difficulty with regard to whispered voices and some television programs.  They have deferred  audiologic testing in the last year.  They do not  have excessive sun exposure. Discussed the need for sun protection: hats, long sleeves and use of sunscreen if there is significant sun exposure.   Diet: the importance of a healthy diet is discussed. They do have a healthy diet.  The benefits of regular aerobic exercise were discussed. She walks 4 times per week ,  20 minutes.   Depression screen: there are no signs or vegative symptoms of depression- irritability, change in appetite, anhedonia, sadness/tearfullness.  Cognitive assessment: the patient manages all their financial and personal affairs and is actively engaged. They could relate day,date,year and events; recalled 2/3 objects at 3 minutes; performed clock-face test normally.  The following portions of the patient's history were reviewed and updated as appropriate: allergies, current medications, past family history, past medical history,  past surgical history, past social history  and problem list.  Visual acuity was not assessed per patient preference since she has regular follow up with her ophthalmologist. Hearing and body mass index were assessed and reviewed.   During the course of the visit the patient was educated and counseled about appropriate screening and preventive services including : fall prevention , diabetes screening, nutrition counseling, colorectal cancer screening, and recommended immunizations.    Objective: BP 116/72  Pulse 65  Temp(Src) 98.1 F (36.7 C) (Oral)  Resp 18  Ht 5' 6.5" (1.689 m)  Wt 172 lb 8 oz (78.245 kg)  BMI 27.43 kg/m2  SpO2  97%  General appearance: alert, cooperative and appears younger than stated age Ears: normal TM's and external ear canals both ears Throat: lips, mucosa, and tongue normal; teeth and gums normal Neck: no adenopathy, no carotid bruit, supple, symmetrical, trachea midline and thyroid not enlarged, symmetric, no tenderness/mass/nodules Back: symmetric, no curvature.  ROM normal. No CVA tenderness. Lungs: clear to auscultation bilaterally Heart: regular rate and rhythm, S1, S2 normal, no murmur, click, rub or gallop Abdomen: soft, non-tender; bowel sounds normal; no masses,  no organomegaly Pulses: 2+ and symmetric Skin: Skin color, texture, turgor normal. No rashes or lesions  Assessment and plan:  Encounter for preventive health examination Annual comprehensive exam was done excluding breast, pelvic and PAP smear. All screenings and immunizations  have been reviewed and addressed  and will be brought up to date once her treatment for lymphoma has been completed .     Updated Medication List Outpatient Encounter Prescriptions as of 05/16/2014  Medication Sig  . albuterol (PROVENTIL HFA;VENTOLIN HFA) 108 (90 BASE) MCG/ACT inhaler Inhale 2 puffs into the lungs every 6 (six) hours as needed.  . Bisacodyl (LAXATIVE PO) Take 1 tablet by mouth daily as needed.   . diltiazem (CARDIZEM) 60 MG tablet Take 1 tablet (60 mg total) by mouth as needed.  . diphenhydrAMINE (BENADRYL) 25 mg capsule Take 25 mg by mouth every 6 (six) hours as needed.  . dofetilide (TIKOSYN) 250 MCG capsule Take 250 mcg by mouth 2 (two) times daily.  Marland Kitchen estradiol (ESTRACE) 1 MG tablet Take 1 tablet (1 mg total) by mouth daily.  . furosemide (LASIX) 20 MG tablet TAKE ONE TABLET BY MOUTH EVERY DAY  . ibuprofen (ADVIL,MOTRIN) 200 MG tablet Take 200 mg by mouth every 6 (six) hours as needed.  . metoprolol (LOPRESSOR) 50 MG tablet Take 1 tablet (50 mg total) by mouth 2 (two) times daily.  Marland Kitchen omeprazole (PRILOSEC) 20 MG capsule Take 1 capsule (20 mg total) by mouth daily.  Marland Kitchen warfarin (COUMADIN) 6 MG tablet Take 6 mg by mouth daily.  . Magnesium 400 MG CAPS Take 1 capsule by mouth 2 (two) times daily.

## 2014-05-16 NOTE — Patient Instructions (Addendum)
  You had your annual Medicare wellness exam today  You are due for your TDaP vaccine, pneumonia vaccine and a Shingles vaccine.  We may need to postpone these until after your treatment for lymphoma   I recommend getting the majority of your calcium and Vitamin D  through diet rather than supplements given the recent association of calcium supplements with increased coronary artery calcium scores (You need 1200 mg daily )   Unsweetened almond/coconut milk is a great low calorie low carb, cholesterol free  way to increase your dietary calcium and vitamin D.  Try the blue Jackquline Bosch  We are required to give you a printed handout during your wellness visit with screening recommendations:   If you are female:  1) you are advised to have a screening for breast cancer with mammogram annually until you are either too ill to undergo treatment for breast cancer, or until you desire no further screening   2) The [place] [employer] does not recommend continued screening for cervical cancer after age 71   3) If you are female,  You are advised to have prostate cancer screening until age 88 with a yearly digital prostate exam and a PSA  4) You are advised to continue  colon cancer screening with eiither :   Colonoscopy every 10 years until age 63 (more frequently depending on the  results)  Or  Annual home fecal occult blood testing

## 2014-05-17 ENCOUNTER — Ambulatory Visit: Payer: Self-pay | Admitting: Internal Medicine

## 2014-05-17 ENCOUNTER — Telehealth: Payer: Self-pay | Admitting: Internal Medicine

## 2014-05-17 DIAGNOSIS — Z1231 Encounter for screening mammogram for malignant neoplasm of breast: Secondary | ICD-10-CM | POA: Diagnosis not present

## 2014-05-17 DIAGNOSIS — R928 Other abnormal and inconclusive findings on diagnostic imaging of breast: Secondary | ICD-10-CM | POA: Diagnosis not present

## 2014-05-17 DIAGNOSIS — Z0000 Encounter for general adult medical examination without abnormal findings: Secondary | ICD-10-CM | POA: Insufficient documentation

## 2014-05-17 NOTE — Telephone Encounter (Signed)
Please ask Carol [name] to check with her oncologist about getting the TDap vaccine and the Shingles vaccine.  If he says its ok to get them I can print them out for her to get at her local pharmacy

## 2014-05-17 NOTE — Telephone Encounter (Signed)
Left message for patient to return call to office,. 

## 2014-05-17 NOTE — Telephone Encounter (Signed)
Patient stated she knows that she received the shingles vaccine at Dr. Gorden Harms office but cannot recall date, but patient will ask Oncologist concerning Tdap. FYI

## 2014-05-17 NOTE — Assessment & Plan Note (Addendum)
Annual comprehensive exam was done excluding breast, pelvic and PAP smear. All screenings have been addressed  And will be brought up to date once her more acute issues have been managed .

## 2014-05-18 ENCOUNTER — Ambulatory Visit: Payer: Self-pay | Admitting: Internal Medicine

## 2014-05-18 ENCOUNTER — Telehealth: Payer: Self-pay | Admitting: Internal Medicine

## 2014-05-18 DIAGNOSIS — N63 Unspecified lump in unspecified breast: Secondary | ICD-10-CM | POA: Diagnosis not present

## 2014-05-18 DIAGNOSIS — H2510 Age-related nuclear cataract, unspecified eye: Secondary | ICD-10-CM | POA: Diagnosis not present

## 2014-05-18 DIAGNOSIS — R928 Other abnormal and inconclusive findings on diagnostic imaging of breast: Secondary | ICD-10-CM | POA: Diagnosis not present

## 2014-05-18 DIAGNOSIS — C801 Malignant (primary) neoplasm, unspecified: Secondary | ICD-10-CM

## 2014-05-18 DIAGNOSIS — N6310 Unspecified lump in the right breast, unspecified quadrant: Secondary | ICD-10-CM

## 2014-05-18 NOTE — Telephone Encounter (Signed)
Discussed with patient the results of diagnostic ultrasound of nonpalpable mass in right breast.  Referring to [facility] for US guided core needle biopsy. Since her ENT and oncologic care is already at [facility], and she is having sinus surgery tomorrow at [facility] ,  She should have this taken care of there as well

## 2014-05-18 NOTE — Telephone Encounter (Signed)
Her mammogram was abnormal on the right.  They saw an abnormal density  and want additional images, which I have ordered.  I prefer to choose the surgeon with the patient if a biopsy is recommended, so if they tell her she needs a biopsy, she can tell them she is going to discuss it with me first.

## 2014-05-18 NOTE — Assessment & Plan Note (Signed)
Diagnosed by recent biopsy of 2 lesions noted in the right orbital fossa during CT scan done by Dr. Tobe Sos for preorbital start cellulitis.  Diagnosis and treatment is being managed by Dr. Purcell Mouton at [facility] oncology. PET scan scheduled for next week.

## 2014-05-18 NOTE — Telephone Encounter (Signed)
Diagnostic mammogram and ultrasound results were received, pt has been informed of results per report and that our office would be referring to surgeon. Results placed in your box.

## 2014-05-19 DIAGNOSIS — J320 Chronic maxillary sinusitis: Secondary | ICD-10-CM | POA: Diagnosis not present

## 2014-05-19 DIAGNOSIS — C6960 Malignant neoplasm of unspecified orbit: Secondary | ICD-10-CM | POA: Diagnosis not present

## 2014-05-19 DIAGNOSIS — E785 Hyperlipidemia, unspecified: Secondary | ICD-10-CM | POA: Diagnosis not present

## 2014-05-19 DIAGNOSIS — J449 Chronic obstructive pulmonary disease, unspecified: Secondary | ICD-10-CM | POA: Diagnosis not present

## 2014-05-19 DIAGNOSIS — C8589 Other specified types of non-Hodgkin lymphoma, extranodal and solid organ sites: Secondary | ICD-10-CM | POA: Diagnosis not present

## 2014-05-19 DIAGNOSIS — J0100 Acute maxillary sinusitis, unspecified: Secondary | ICD-10-CM | POA: Diagnosis not present

## 2014-05-19 DIAGNOSIS — Z888 Allergy status to other drugs, medicaments and biological substances status: Secondary | ICD-10-CM | POA: Diagnosis not present

## 2014-05-19 DIAGNOSIS — Z885 Allergy status to narcotic agent status: Secondary | ICD-10-CM | POA: Diagnosis not present

## 2014-05-19 DIAGNOSIS — H0520 Unspecified exophthalmos: Secondary | ICD-10-CM | POA: Diagnosis not present

## 2014-05-19 DIAGNOSIS — I4891 Unspecified atrial fibrillation: Secondary | ICD-10-CM | POA: Diagnosis not present

## 2014-05-19 DIAGNOSIS — Z91013 Allergy to seafood: Secondary | ICD-10-CM | POA: Diagnosis not present

## 2014-05-19 DIAGNOSIS — K219 Gastro-esophageal reflux disease without esophagitis: Secondary | ICD-10-CM | POA: Diagnosis not present

## 2014-05-19 DIAGNOSIS — K589 Irritable bowel syndrome without diarrhea: Secondary | ICD-10-CM | POA: Diagnosis not present

## 2014-05-19 DIAGNOSIS — Z8589 Personal history of malignant neoplasm of other organs and systems: Secondary | ICD-10-CM | POA: Diagnosis not present

## 2014-05-19 DIAGNOSIS — I509 Heart failure, unspecified: Secondary | ICD-10-CM | POA: Diagnosis not present

## 2014-05-19 DIAGNOSIS — Z87898 Personal history of other specified conditions: Secondary | ICD-10-CM | POA: Diagnosis not present

## 2014-05-19 DIAGNOSIS — G4730 Sleep apnea, unspecified: Secondary | ICD-10-CM | POA: Diagnosis not present

## 2014-05-19 DIAGNOSIS — Z79899 Other long term (current) drug therapy: Secondary | ICD-10-CM | POA: Diagnosis not present

## 2014-05-19 DIAGNOSIS — B49 Unspecified mycosis: Secondary | ICD-10-CM | POA: Diagnosis not present

## 2014-05-19 DIAGNOSIS — E669 Obesity, unspecified: Secondary | ICD-10-CM | POA: Diagnosis not present

## 2014-05-19 DIAGNOSIS — Z881 Allergy status to other antibiotic agents status: Secondary | ICD-10-CM | POA: Diagnosis not present

## 2014-05-19 DIAGNOSIS — M129 Arthropathy, unspecified: Secondary | ICD-10-CM | POA: Diagnosis not present

## 2014-05-19 DIAGNOSIS — D481 Neoplasm of uncertain behavior of connective and other soft tissue: Secondary | ICD-10-CM | POA: Diagnosis not present

## 2014-05-19 NOTE — Telephone Encounter (Signed)
Patient is scheduled for Tuesday, July 21st at [facility] with Dr.Susan Webb Silversmith.  I have left a voice mail on the pt's machine with the information, and asking her to call back to confirm she is aware of this appointment.    Thanks!

## 2014-05-20 DIAGNOSIS — N63 Unspecified lump in unspecified breast: Secondary | ICD-10-CM | POA: Diagnosis not present

## 2014-05-24 DIAGNOSIS — Z803 Family history of malignant neoplasm of breast: Secondary | ICD-10-CM | POA: Diagnosis not present

## 2014-05-24 DIAGNOSIS — Z885 Allergy status to narcotic agent status: Secondary | ICD-10-CM | POA: Diagnosis not present

## 2014-05-24 DIAGNOSIS — C50919 Malignant neoplasm of unspecified site of unspecified female breast: Secondary | ICD-10-CM | POA: Diagnosis not present

## 2014-05-24 DIAGNOSIS — Z87898 Personal history of other specified conditions: Secondary | ICD-10-CM | POA: Diagnosis not present

## 2014-05-24 DIAGNOSIS — J329 Chronic sinusitis, unspecified: Secondary | ICD-10-CM | POA: Diagnosis not present

## 2014-05-24 DIAGNOSIS — R928 Other abnormal and inconclusive findings on diagnostic imaging of breast: Secondary | ICD-10-CM | POA: Diagnosis not present

## 2014-05-24 DIAGNOSIS — N63 Unspecified lump in unspecified breast: Secondary | ICD-10-CM | POA: Diagnosis not present

## 2014-05-30 ENCOUNTER — Encounter: Payer: Self-pay | Admitting: Internal Medicine

## 2014-06-01 DIAGNOSIS — C8589 Other specified types of non-Hodgkin lymphoma, extranodal and solid organ sites: Secondary | ICD-10-CM | POA: Diagnosis not present

## 2014-06-01 DIAGNOSIS — R928 Other abnormal and inconclusive findings on diagnostic imaging of breast: Secondary | ICD-10-CM | POA: Diagnosis not present

## 2014-06-01 DIAGNOSIS — C50919 Malignant neoplasm of unspecified site of unspecified female breast: Secondary | ICD-10-CM | POA: Diagnosis not present

## 2014-06-01 DIAGNOSIS — Z170 Estrogen receptor positive status [ER+]: Secondary | ICD-10-CM | POA: Insufficient documentation

## 2014-06-01 DIAGNOSIS — C50311 Malignant neoplasm of lower-inner quadrant of right female breast: Secondary | ICD-10-CM | POA: Insufficient documentation

## 2014-06-01 DIAGNOSIS — C6960 Malignant neoplasm of unspecified orbit: Secondary | ICD-10-CM | POA: Diagnosis not present

## 2014-06-01 DIAGNOSIS — J329 Chronic sinusitis, unspecified: Secondary | ICD-10-CM | POA: Diagnosis not present

## 2014-06-01 DIAGNOSIS — C801 Malignant (primary) neoplasm, unspecified: Secondary | ICD-10-CM | POA: Diagnosis not present

## 2014-06-02 DIAGNOSIS — I4891 Unspecified atrial fibrillation: Secondary | ICD-10-CM | POA: Diagnosis not present

## 2014-06-06 ENCOUNTER — Encounter: Payer: Self-pay | Admitting: Internal Medicine

## 2014-06-06 DIAGNOSIS — C50919 Malignant neoplasm of unspecified site of unspecified female breast: Secondary | ICD-10-CM | POA: Insufficient documentation

## 2014-06-06 DIAGNOSIS — Z853 Personal history of malignant neoplasm of breast: Secondary | ICD-10-CM | POA: Insufficient documentation

## 2014-06-08 DIAGNOSIS — I4891 Unspecified atrial fibrillation: Secondary | ICD-10-CM | POA: Diagnosis not present

## 2014-06-14 DIAGNOSIS — J449 Chronic obstructive pulmonary disease, unspecified: Secondary | ICD-10-CM | POA: Diagnosis not present

## 2014-06-14 DIAGNOSIS — E041 Nontoxic single thyroid nodule: Secondary | ICD-10-CM | POA: Diagnosis not present

## 2014-06-14 DIAGNOSIS — E669 Obesity, unspecified: Secondary | ICD-10-CM | POA: Diagnosis not present

## 2014-06-14 DIAGNOSIS — Z79899 Other long term (current) drug therapy: Secondary | ICD-10-CM | POA: Diagnosis not present

## 2014-06-14 DIAGNOSIS — H0520 Unspecified exophthalmos: Secondary | ICD-10-CM | POA: Diagnosis not present

## 2014-06-14 DIAGNOSIS — I509 Heart failure, unspecified: Secondary | ICD-10-CM | POA: Diagnosis not present

## 2014-06-14 DIAGNOSIS — Z91013 Allergy to seafood: Secondary | ICD-10-CM | POA: Diagnosis not present

## 2014-06-14 DIAGNOSIS — Z170 Estrogen receptor positive status [ER+]: Secondary | ICD-10-CM | POA: Diagnosis not present

## 2014-06-14 DIAGNOSIS — R6884 Jaw pain: Secondary | ICD-10-CM | POA: Diagnosis not present

## 2014-06-14 DIAGNOSIS — C50919 Malignant neoplasm of unspecified site of unspecified female breast: Secondary | ICD-10-CM | POA: Diagnosis not present

## 2014-06-14 DIAGNOSIS — K589 Irritable bowel syndrome without diarrhea: Secondary | ICD-10-CM | POA: Diagnosis not present

## 2014-06-14 DIAGNOSIS — N63 Unspecified lump in unspecified breast: Secondary | ICD-10-CM | POA: Diagnosis not present

## 2014-06-14 DIAGNOSIS — Z885 Allergy status to narcotic agent status: Secondary | ICD-10-CM | POA: Diagnosis not present

## 2014-06-14 DIAGNOSIS — E785 Hyperlipidemia, unspecified: Secondary | ICD-10-CM | POA: Diagnosis not present

## 2014-06-14 DIAGNOSIS — K219 Gastro-esophageal reflux disease without esophagitis: Secondary | ICD-10-CM | POA: Diagnosis not present

## 2014-06-14 DIAGNOSIS — G4730 Sleep apnea, unspecified: Secondary | ICD-10-CM | POA: Diagnosis not present

## 2014-06-14 DIAGNOSIS — Z91018 Allergy to other foods: Secondary | ICD-10-CM | POA: Diagnosis not present

## 2014-06-14 DIAGNOSIS — C50419 Malignant neoplasm of upper-outer quadrant of unspecified female breast: Secondary | ICD-10-CM | POA: Diagnosis not present

## 2014-06-14 DIAGNOSIS — R928 Other abnormal and inconclusive findings on diagnostic imaging of breast: Secondary | ICD-10-CM | POA: Diagnosis not present

## 2014-06-14 DIAGNOSIS — I4891 Unspecified atrial fibrillation: Secondary | ICD-10-CM | POA: Diagnosis not present

## 2014-06-14 DIAGNOSIS — I10 Essential (primary) hypertension: Secondary | ICD-10-CM | POA: Diagnosis not present

## 2014-06-14 DIAGNOSIS — Z881 Allergy status to other antibiotic agents status: Secondary | ICD-10-CM | POA: Diagnosis not present

## 2014-06-16 DIAGNOSIS — C50919 Malignant neoplasm of unspecified site of unspecified female breast: Secondary | ICD-10-CM | POA: Diagnosis not present

## 2014-06-16 DIAGNOSIS — Z79899 Other long term (current) drug therapy: Secondary | ICD-10-CM | POA: Diagnosis not present

## 2014-06-16 DIAGNOSIS — B49 Unspecified mycosis: Secondary | ICD-10-CM | POA: Diagnosis not present

## 2014-06-16 DIAGNOSIS — J329 Chronic sinusitis, unspecified: Secondary | ICD-10-CM | POA: Diagnosis not present

## 2014-06-16 DIAGNOSIS — C8581 Other specified types of non-Hodgkin lymphoma, lymph nodes of head, face, and neck: Secondary | ICD-10-CM | POA: Diagnosis not present

## 2014-07-06 DIAGNOSIS — I4891 Unspecified atrial fibrillation: Secondary | ICD-10-CM | POA: Diagnosis not present

## 2014-07-18 DIAGNOSIS — C50919 Malignant neoplasm of unspecified site of unspecified female breast: Secondary | ICD-10-CM | POA: Diagnosis not present

## 2014-07-20 DIAGNOSIS — I4891 Unspecified atrial fibrillation: Secondary | ICD-10-CM | POA: Diagnosis not present

## 2014-07-21 DIAGNOSIS — H04129 Dry eye syndrome of unspecified lacrimal gland: Secondary | ICD-10-CM | POA: Diagnosis not present

## 2014-08-02 DIAGNOSIS — I4891 Unspecified atrial fibrillation: Secondary | ICD-10-CM | POA: Diagnosis not present

## 2014-08-03 DIAGNOSIS — J310 Chronic rhinitis: Secondary | ICD-10-CM | POA: Diagnosis not present

## 2014-08-03 DIAGNOSIS — C8581 Other specified types of non-Hodgkin lymphoma, lymph nodes of head, face, and neck: Secondary | ICD-10-CM | POA: Diagnosis not present

## 2014-08-03 DIAGNOSIS — C50919 Malignant neoplasm of unspecified site of unspecified female breast: Secondary | ICD-10-CM | POA: Diagnosis not present

## 2014-08-08 DIAGNOSIS — C50911 Malignant neoplasm of unspecified site of right female breast: Secondary | ICD-10-CM | POA: Diagnosis not present

## 2014-08-10 DIAGNOSIS — I480 Paroxysmal atrial fibrillation: Secondary | ICD-10-CM | POA: Diagnosis not present

## 2014-08-10 DIAGNOSIS — I5042 Chronic combined systolic (congestive) and diastolic (congestive) heart failure: Secondary | ICD-10-CM | POA: Diagnosis not present

## 2014-08-10 DIAGNOSIS — E785 Hyperlipidemia, unspecified: Secondary | ICD-10-CM | POA: Diagnosis not present

## 2014-08-10 DIAGNOSIS — I482 Chronic atrial fibrillation: Secondary | ICD-10-CM | POA: Diagnosis not present

## 2014-08-12 DIAGNOSIS — H2513 Age-related nuclear cataract, bilateral: Secondary | ICD-10-CM | POA: Diagnosis not present

## 2014-08-12 DIAGNOSIS — C50911 Malignant neoplasm of unspecified site of right female breast: Secondary | ICD-10-CM | POA: Diagnosis not present

## 2014-08-15 DIAGNOSIS — C50911 Malignant neoplasm of unspecified site of right female breast: Secondary | ICD-10-CM | POA: Diagnosis not present

## 2014-08-16 DIAGNOSIS — C50911 Malignant neoplasm of unspecified site of right female breast: Secondary | ICD-10-CM | POA: Diagnosis not present

## 2014-08-17 DIAGNOSIS — C50911 Malignant neoplasm of unspecified site of right female breast: Secondary | ICD-10-CM | POA: Diagnosis not present

## 2014-08-18 DIAGNOSIS — C50911 Malignant neoplasm of unspecified site of right female breast: Secondary | ICD-10-CM | POA: Diagnosis not present

## 2014-08-19 DIAGNOSIS — C50911 Malignant neoplasm of unspecified site of right female breast: Secondary | ICD-10-CM | POA: Diagnosis not present

## 2014-08-22 DIAGNOSIS — C50911 Malignant neoplasm of unspecified site of right female breast: Secondary | ICD-10-CM | POA: Diagnosis not present

## 2014-08-23 DIAGNOSIS — C50911 Malignant neoplasm of unspecified site of right female breast: Secondary | ICD-10-CM | POA: Diagnosis not present

## 2014-08-24 DIAGNOSIS — C50911 Malignant neoplasm of unspecified site of right female breast: Secondary | ICD-10-CM | POA: Diagnosis not present

## 2014-08-25 ENCOUNTER — Ambulatory Visit: Payer: Self-pay | Admitting: Ophthalmology

## 2014-08-25 DIAGNOSIS — I10 Essential (primary) hypertension: Secondary | ICD-10-CM | POA: Diagnosis not present

## 2014-08-25 DIAGNOSIS — Z01812 Encounter for preprocedural laboratory examination: Secondary | ICD-10-CM | POA: Diagnosis not present

## 2014-08-25 DIAGNOSIS — H269 Unspecified cataract: Secondary | ICD-10-CM | POA: Diagnosis not present

## 2014-08-25 DIAGNOSIS — Z01810 Encounter for preprocedural cardiovascular examination: Secondary | ICD-10-CM | POA: Diagnosis not present

## 2014-08-25 DIAGNOSIS — C50911 Malignant neoplasm of unspecified site of right female breast: Secondary | ICD-10-CM | POA: Diagnosis not present

## 2014-08-25 DIAGNOSIS — H2513 Age-related nuclear cataract, bilateral: Secondary | ICD-10-CM | POA: Diagnosis not present

## 2014-08-25 LAB — POTASSIUM: POTASSIUM: 4.7 mmol/L (ref 3.5–5.1)

## 2014-08-26 DIAGNOSIS — C50911 Malignant neoplasm of unspecified site of right female breast: Secondary | ICD-10-CM | POA: Diagnosis not present

## 2014-08-29 DIAGNOSIS — C50911 Malignant neoplasm of unspecified site of right female breast: Secondary | ICD-10-CM | POA: Diagnosis not present

## 2014-08-30 DIAGNOSIS — I4891 Unspecified atrial fibrillation: Secondary | ICD-10-CM | POA: Diagnosis not present

## 2014-08-30 DIAGNOSIS — C50911 Malignant neoplasm of unspecified site of right female breast: Secondary | ICD-10-CM | POA: Diagnosis not present

## 2014-08-30 DIAGNOSIS — I482 Chronic atrial fibrillation: Secondary | ICD-10-CM | POA: Diagnosis not present

## 2014-08-31 DIAGNOSIS — C50911 Malignant neoplasm of unspecified site of right female breast: Secondary | ICD-10-CM | POA: Diagnosis not present

## 2014-09-01 DIAGNOSIS — C50911 Malignant neoplasm of unspecified site of right female breast: Secondary | ICD-10-CM | POA: Diagnosis not present

## 2014-09-02 DIAGNOSIS — C50911 Malignant neoplasm of unspecified site of right female breast: Secondary | ICD-10-CM | POA: Diagnosis not present

## 2014-09-05 DIAGNOSIS — C8581 Other specified types of non-Hodgkin lymphoma, lymph nodes of head, face, and neck: Secondary | ICD-10-CM | POA: Diagnosis not present

## 2014-09-05 DIAGNOSIS — C50919 Malignant neoplasm of unspecified site of unspecified female breast: Secondary | ICD-10-CM | POA: Diagnosis not present

## 2014-09-05 DIAGNOSIS — C50911 Malignant neoplasm of unspecified site of right female breast: Secondary | ICD-10-CM | POA: Diagnosis not present

## 2014-09-06 DIAGNOSIS — C50919 Malignant neoplasm of unspecified site of unspecified female breast: Secondary | ICD-10-CM | POA: Diagnosis not present

## 2014-09-06 DIAGNOSIS — C8581 Other specified types of non-Hodgkin lymphoma, lymph nodes of head, face, and neck: Secondary | ICD-10-CM | POA: Diagnosis not present

## 2014-09-08 ENCOUNTER — Ambulatory Visit: Payer: Self-pay | Admitting: Ophthalmology

## 2014-09-08 DIAGNOSIS — R002 Palpitations: Secondary | ICD-10-CM | POA: Diagnosis not present

## 2014-09-08 DIAGNOSIS — K219 Gastro-esophageal reflux disease without esophagitis: Secondary | ICD-10-CM | POA: Diagnosis not present

## 2014-09-08 DIAGNOSIS — H2513 Age-related nuclear cataract, bilateral: Secondary | ICD-10-CM | POA: Diagnosis not present

## 2014-09-08 DIAGNOSIS — H2511 Age-related nuclear cataract, right eye: Secondary | ICD-10-CM | POA: Diagnosis not present

## 2014-09-08 DIAGNOSIS — Z79899 Other long term (current) drug therapy: Secondary | ICD-10-CM | POA: Diagnosis not present

## 2014-09-08 DIAGNOSIS — H269 Unspecified cataract: Secondary | ICD-10-CM | POA: Diagnosis not present

## 2014-09-08 DIAGNOSIS — Z7901 Long term (current) use of anticoagulants: Secondary | ICD-10-CM | POA: Diagnosis not present

## 2014-09-08 DIAGNOSIS — Z8572 Personal history of non-Hodgkin lymphomas: Secondary | ICD-10-CM | POA: Diagnosis not present

## 2014-09-08 DIAGNOSIS — I4891 Unspecified atrial fibrillation: Secondary | ICD-10-CM | POA: Diagnosis not present

## 2014-09-08 DIAGNOSIS — Z885 Allergy status to narcotic agent status: Secondary | ICD-10-CM | POA: Diagnosis not present

## 2014-09-08 DIAGNOSIS — Z853 Personal history of malignant neoplasm of breast: Secondary | ICD-10-CM | POA: Diagnosis not present

## 2014-09-08 DIAGNOSIS — R42 Dizziness and giddiness: Secondary | ICD-10-CM | POA: Diagnosis not present

## 2014-09-14 DIAGNOSIS — J0101 Acute recurrent maxillary sinusitis: Secondary | ICD-10-CM | POA: Diagnosis not present

## 2014-09-14 DIAGNOSIS — C8581 Other specified types of non-Hodgkin lymphoma, lymph nodes of head, face, and neck: Secondary | ICD-10-CM | POA: Diagnosis not present

## 2014-09-14 DIAGNOSIS — C801 Malignant (primary) neoplasm, unspecified: Secondary | ICD-10-CM | POA: Diagnosis not present

## 2014-09-14 DIAGNOSIS — C6961 Malignant neoplasm of right orbit: Secondary | ICD-10-CM | POA: Diagnosis not present

## 2014-09-14 DIAGNOSIS — J310 Chronic rhinitis: Secondary | ICD-10-CM | POA: Diagnosis not present

## 2014-09-14 DIAGNOSIS — C8590 Non-Hodgkin lymphoma, unspecified, unspecified site: Secondary | ICD-10-CM | POA: Diagnosis not present

## 2014-09-14 DIAGNOSIS — C50911 Malignant neoplasm of unspecified site of right female breast: Secondary | ICD-10-CM | POA: Diagnosis not present

## 2014-09-15 DIAGNOSIS — I482 Chronic atrial fibrillation: Secondary | ICD-10-CM | POA: Diagnosis not present

## 2014-09-15 DIAGNOSIS — C50919 Malignant neoplasm of unspecified site of unspecified female breast: Secondary | ICD-10-CM | POA: Diagnosis not present

## 2014-09-15 DIAGNOSIS — C8581 Other specified types of non-Hodgkin lymphoma, lymph nodes of head, face, and neck: Secondary | ICD-10-CM | POA: Diagnosis not present

## 2014-09-15 DIAGNOSIS — C50911 Malignant neoplasm of unspecified site of right female breast: Secondary | ICD-10-CM | POA: Diagnosis not present

## 2014-09-15 DIAGNOSIS — Z23 Encounter for immunization: Secondary | ICD-10-CM | POA: Diagnosis not present

## 2014-09-26 DIAGNOSIS — I4891 Unspecified atrial fibrillation: Secondary | ICD-10-CM | POA: Diagnosis not present

## 2014-10-05 DIAGNOSIS — J310 Chronic rhinitis: Secondary | ICD-10-CM | POA: Diagnosis not present

## 2014-10-19 DIAGNOSIS — I4891 Unspecified atrial fibrillation: Secondary | ICD-10-CM | POA: Diagnosis not present

## 2014-10-31 DIAGNOSIS — I480 Paroxysmal atrial fibrillation: Secondary | ICD-10-CM | POA: Diagnosis not present

## 2014-10-31 DIAGNOSIS — E782 Mixed hyperlipidemia: Secondary | ICD-10-CM | POA: Diagnosis not present

## 2014-10-31 DIAGNOSIS — I341 Nonrheumatic mitral (valve) prolapse: Secondary | ICD-10-CM | POA: Diagnosis not present

## 2014-11-07 ENCOUNTER — Encounter: Payer: Self-pay | Admitting: Internal Medicine

## 2014-11-07 ENCOUNTER — Ambulatory Visit (INDEPENDENT_AMBULATORY_CARE_PROVIDER_SITE_OTHER): Payer: Medicare Other | Admitting: Internal Medicine

## 2014-11-07 VITALS — BP 118/70 | HR 68 | Temp 97.7°F | Resp 16 | Ht 66.5 in | Wt 181.0 lb

## 2014-11-07 DIAGNOSIS — Z90710 Acquired absence of both cervix and uterus: Secondary | ICD-10-CM

## 2014-11-07 DIAGNOSIS — Z0000 Encounter for general adult medical examination without abnormal findings: Secondary | ICD-10-CM | POA: Diagnosis not present

## 2014-11-07 DIAGNOSIS — Z1159 Encounter for screening for other viral diseases: Secondary | ICD-10-CM | POA: Diagnosis not present

## 2014-11-07 DIAGNOSIS — R5383 Other fatigue: Secondary | ICD-10-CM

## 2014-11-07 DIAGNOSIS — E559 Vitamin D deficiency, unspecified: Secondary | ICD-10-CM

## 2014-11-07 DIAGNOSIS — Z90722 Acquired absence of ovaries, bilateral: Secondary | ICD-10-CM | POA: Diagnosis not present

## 2014-11-07 DIAGNOSIS — Z9079 Acquired absence of other genital organ(s): Secondary | ICD-10-CM

## 2014-11-07 DIAGNOSIS — C8591 Non-Hodgkin lymphoma, unspecified, lymph nodes of head, face, and neck: Secondary | ICD-10-CM

## 2014-11-07 DIAGNOSIS — E785 Hyperlipidemia, unspecified: Secondary | ICD-10-CM

## 2014-11-07 DIAGNOSIS — Z1211 Encounter for screening for malignant neoplasm of colon: Secondary | ICD-10-CM | POA: Diagnosis not present

## 2014-11-07 DIAGNOSIS — C50911 Malignant neoplasm of unspecified site of right female breast: Secondary | ICD-10-CM | POA: Diagnosis not present

## 2014-11-07 LAB — COMPREHENSIVE METABOLIC PANEL
ALBUMIN: 3.8 g/dL (ref 3.5–5.2)
ALT: 22 U/L (ref 0–35)
AST: 25 U/L (ref 0–37)
Alkaline Phosphatase: 48 U/L (ref 39–117)
BUN: 11 mg/dL (ref 6–23)
CALCIUM: 9.2 mg/dL (ref 8.4–10.5)
CHLORIDE: 103 meq/L (ref 96–112)
CO2: 27 mEq/L (ref 19–32)
Creatinine, Ser: 0.7 mg/dL (ref 0.4–1.2)
GFR: 86.9 mL/min (ref >60.00)
Glucose, Bld: 92 mg/dL (ref 70–99)
Potassium: 4.7 mEq/L (ref 3.5–5.1)
SODIUM: 140 meq/L (ref 135–145)
TOTAL PROTEIN: 6.3 g/dL (ref 6.0–8.3)
Total Bilirubin: 0.9 mg/dL (ref 0.2–1.2)

## 2014-11-07 LAB — CBC WITH DIFFERENTIAL/PLATELET
Basophils Absolute: 0 10*3/uL (ref 0.0–0.1)
Basophils Relative: 0.3 % (ref 0.0–3.0)
EOS PCT: 2.7 % (ref 0.0–5.0)
Eosinophils Absolute: 0.2 10*3/uL (ref 0.0–0.7)
HCT: 39.5 % (ref 36.0–46.0)
Hemoglobin: 13.1 g/dL (ref 12.0–15.0)
LYMPHS ABS: 0.8 10*3/uL (ref 0.7–4.0)
Lymphocytes Relative: 13.7 % (ref 12.0–46.0)
MCHC: 33 g/dL (ref 30.0–36.0)
MCV: 101 fl — ABNORMAL HIGH (ref 78.0–100.0)
Monocytes Absolute: 0.6 10*3/uL (ref 0.1–1.0)
Monocytes Relative: 10.3 % (ref 3.0–12.0)
NEUTROS PCT: 73 % (ref 43.0–77.0)
Neutro Abs: 4.3 10*3/uL (ref 1.4–7.7)
PLATELETS: 248 10*3/uL (ref 150.0–400.0)
RBC: 3.92 Mil/uL (ref 3.87–5.11)
RDW: 13.7 % (ref 11.5–15.5)
WBC: 5.9 10*3/uL (ref 4.0–10.5)

## 2014-11-07 LAB — LIPID PANEL
CHOL/HDL RATIO: 4
Cholesterol: 227 mg/dL — ABNORMAL HIGH (ref 0–200)
HDL: 57 mg/dL (ref >39.00)
LDL Cholesterol: 140 mg/dL — ABNORMAL HIGH (ref 0–99)
NONHDL: 170
Triglycerides: 151 mg/dL — ABNORMAL HIGH (ref 0.0–149.0)
VLDL: 30.2 mg/dL (ref 0.0–40.0)

## 2014-11-07 LAB — VITAMIN D 25 HYDROXY (VIT D DEFICIENCY, FRACTURES): VITD: 28.33 ng/mL — ABNORMAL LOW (ref 30.00–100.00)

## 2014-11-07 LAB — TSH: TSH: 1.88 u[IU]/mL (ref 0.35–4.50)

## 2014-11-07 MED ORDER — TETANUS-DIPHTH-ACELL PERTUSSIS 5-2.5-18.5 LF-MCG/0.5 IM SUSP: 0.5000 mL | Freq: Once | INTRAMUSCULAR | Status: DC

## 2014-11-07 MED ORDER — FUROSEMIDE 20 MG PO TABS: ORAL_TABLET | ORAL | Status: DC

## 2014-11-07 NOTE — Patient Instructions (Signed)
You had your annual Medicare wellness exam today  Please use the stool kit to send [place] back a sample to test for blood.  This is your colon CA screening test.   You need to have a TDaP vaccine .  I have given you prescription for this  Because it will be cheaper at the health Dept or at your  local pharmacy because Medicare will not reimburse for it  We will contact you with the bloodwork results  Health Maintenance Adopting a healthy lifestyle and getting preventive care can go a long way to promote health and wellness. Talk with your health care provider about what schedule of regular examinations is right for you. This is a good chance for you to check in with your provider about disease prevention and staying healthy. In between checkups, there are plenty of things you can do on your own. Experts have done a lot of research about which lifestyle changes and preventive measures are most likely to keep you healthy. Ask your health care provider for more information. WEIGHT AND DIET  Eat a healthy diet  Be sure to include plenty of vegetables, fruits, low-fat dairy products, and lean protein.  Do not eat a lot of foods high in solid fats, added sugars, or salt.  Get regular exercise. This is one of the most important things you can do for your health.  Most adults should exercise for at least 150 minutes each week. The exercise should increase your heart rate and make you sweat (moderate-intensity exercise).  Most adults should also do strengthening exercises at least twice a week. This is in addition to the moderate-intensity exercise.  Maintain a healthy weight  Body mass index (BMI) is a measurement that can be used to identify possible weight problems. It estimates body fat based on height and weight. Your health care provider can help determine your BMI and help you achieve or maintain a healthy weight.  For females 98 years of age and older:   A BMI below 18.5 is considered  underweight.  A BMI of 18.5 to 24.9 is normal.  A BMI of 25 to 29.9 is considered overweight.  A BMI of 30 and above is considered obese.  Watch levels of cholesterol and blood lipids  You should start having your blood tested for lipids and cholesterol at 75 years of age, then have this test every 5 years.  You may need to have your cholesterol levels checked more often if:  Your lipid or cholesterol levels are high.  You are older than 75 years of age.  You are at high risk for heart disease.  CANCER SCREENING   Lung Cancer  Lung cancer screening is recommended for adults 79-41 years old who are at high risk for lung cancer because of a history of smoking.  A yearly low-dose CT scan of the lungs is recommended for people who:  Currently smoke.  Have quit within the past 15 years.  Have at least a 30-pack-year history of smoking. A pack year is smoking an average of one pack of cigarettes a day for 1 year.  Yearly screening should continue until it has been 15 years since you quit.  Yearly screening should stop if you develop a health problem that would prevent you from having lung cancer treatment.  Breast Cancer  Practice breast self-awareness. This means understanding how your breasts normally appear and feel.  It also means doing regular breast self-exams. Let your health care provider know  about any changes, no matter how small.  If you are in your 20s or 30s, you should have a clinical breast exam (CBE) by a health care provider every 1-3 years as part of a regular health exam.  If you are 71 or older, have a CBE every year. Also consider having a breast X-ray (mammogram) every year.  If you have a family history of breast cancer, talk to your health care provider about genetic screening.  If you are at high risk for breast cancer, talk to your health care provider about having an MRI and a mammogram every year.  Breast cancer gene (BRCA) assessment is  recommended for women who have family members with BRCA-related cancers. BRCA-related cancers include:  Breast.  Ovarian.  Tubal.  Peritoneal cancers.  Results of the assessment will determine the need for genetic counseling and BRCA1 and BRCA2 testing. Cervical Cancer Routine pelvic examinations to screen for cervical cancer are no longer recommended for nonpregnant women who are considered low risk for cancer of the pelvic organs (ovaries, uterus, and vagina) and who do not have symptoms. A pelvic examination may be necessary if you have symptoms including those associated with pelvic infections. Ask your health care provider if a screening pelvic exam is right for you.   The Pap test is the screening test for cervical cancer for women who are considered at risk.  If you had a hysterectomy for a problem that was not cancer or a condition that could lead to cancer, then you no longer need Pap tests.  If you are older than 65 years, and you have had normal Pap tests for the past 10 years, you no longer need to have Pap tests.  If you have had past treatment for cervical cancer or a condition that could lead to cancer, you need Pap tests and screening for cancer for at least 20 years after your treatment.  If you no longer get a Pap test, assess your risk factors if they change (such as having a new sexual partner). This can affect whether you should start being screened again.  Some women have medical problems that increase their chance of getting cervical cancer. If this is the case for you, your health care provider may recommend more frequent screening and Pap tests.  The human papillomavirus (HPV) test is another test that may be used for cervical cancer screening. The HPV test looks for the virus that can cause cell changes in the cervix. The cells collected during the Pap test can be tested for HPV.  The HPV test can be used to screen women 41 years of age and older. Getting tested  for HPV can extend the interval between normal Pap tests from three to five years.  An HPV test also should be used to screen women of any age who have unclear Pap test results.  After 75 years of age, women should have HPV testing as often as Pap tests.  Colorectal Cancer  This type of cancer can be detected and often prevented.  Routine colorectal cancer screening usually begins at 75 years of age and continues through 75 years of age.  Your health care provider may recommend screening at an earlier age if you have risk factors for colon cancer.  Your health care provider may also recommend using home test kits to check for hidden blood in the stool.  A small camera at the end of a tube can be used to examine your colon directly (sigmoidoscopy  or colonoscopy). This is done to check for the earliest forms of colorectal cancer.  Routine screening usually begins at age 20.  Direct examination of the colon should be repeated every 5-10 years through 75 years of age. However, you may need to be screened more often if early forms of precancerous polyps or small growths are found. Skin Cancer  Check your skin from head to toe regularly.  Tell your health care provider about any new moles or changes in moles, especially if there is a change in a mole's shape or color.  Also tell your health care provider if you have a mole that is larger than the size of a pencil eraser.  Always use sunscreen. Apply sunscreen liberally and repeatedly throughout the day.  Protect yourself by wearing long sleeves, pants, a wide-brimmed hat, and sunglasses whenever you are outside. HEART DISEASE, DIABETES, AND HIGH BLOOD PRESSURE   Have your blood pressure checked at least every 1-2 years. High blood pressure causes heart disease and increases the risk of stroke.  If you are between 65 years and 36 years old, ask your health care provider if you should take aspirin to prevent strokes.  Have regular  diabetes screenings. This involves taking a blood sample to check your fasting blood sugar level.  If you are at a normal weight and have a low risk for diabetes, have this test once every three years after 75 years of age.  If you are overweight and have a high risk for diabetes, consider being tested at a younger age or more often. PREVENTING INFECTION  Hepatitis B  If you have a higher risk for hepatitis B, you should be screened for this virus. You are considered at high risk for hepatitis B if:  You were born in a country where hepatitis B is common. Ask your health care provider which countries are considered high risk.  Your parents were born in a high-risk country, and you have not been immunized against hepatitis B (hepatitis B vaccine).  You have HIV or AIDS.  You use needles to inject street drugs.  You live with someone who has hepatitis B.  You have had sex with someone who has hepatitis B.  You get hemodialysis treatment.  You take certain medicines for conditions, including cancer, organ transplantation, and autoimmune conditions. Hepatitis C  Blood testing is recommended for:  Everyone born from [birth year] through 1966.  Anyone with known risk factors for hepatitis C. Sexually transmitted infections (STIs)  You should be screened for sexually transmitted infections (STIs) including gonorrhea and chlamydia if:  You are sexually active and are younger than 75 years of age.  You are older than 75 years of age and your health care provider tells you that you are at risk for this type of infection.  Your sexual activity has changed since you were last screened and you are at an increased risk for chlamydia or gonorrhea. Ask your health care provider if you are at risk.  If you do not have HIV, but are at risk, it may be recommended that you take a prescription medicine daily to prevent HIV infection. This is called pre-exposure prophylaxis (PrEP). You are considered at  risk if:  You are sexually active and do not regularly use condoms or know the HIV status of your partner(s).  You take drugs by injection.  You are sexually active with a partner who has HIV. Talk with your health care provider about whether you are at high  risk of being infected with HIV. If you choose to begin PrEP, you should first be tested for HIV. You should then be tested every 3 months for as long as you are taking PrEP.  PREGNANCY   If you are premenopausal and you may become pregnant, ask your health care provider about preconception counseling.  If you may become pregnant, take 400 to 800 micrograms (mcg) of folic acid every day.  If you want to prevent pregnancy, talk to your health care provider about birth control (contraception). OSTEOPOROSIS AND MENOPAUSE   Osteoporosis is a disease in which the bones lose minerals and strength with aging. This can result in serious bone fractures. Your risk for osteoporosis can be identified using a bone density scan.  If you are 27 years of age or older, or if you are at risk for osteoporosis and fractures, ask your health care provider if you should be screened.  Ask your health care provider whether you should take a calcium or vitamin D supplement to lower your risk for osteoporosis.  Menopause may have certain physical symptoms and risks.  Hormone replacement therapy may reduce some of these symptoms and risks. Talk to your health care provider about whether hormone replacement therapy is right for you.  HOME CARE INSTRUCTIONS   Schedule regular health, dental, and eye exams.  Stay current with your immunizations.   Do not use any tobacco products including cigarettes, chewing tobacco, or electronic cigarettes.  If you are pregnant, do not drink alcohol.  If you are breastfeeding, limit how much and how often you drink alcohol.  Limit alcohol intake to no more than 1 drink per day for nonpregnant women. One drink equals  12 ounces of beer, 5 ounces of wine, or 1 ounces of hard liquor.  Do not use street drugs.  Do not share needles.  Ask your health care provider for help if you need support or information about quitting drugs.  Tell your health care provider if you often feel depressed.  Tell your health care provider if you have ever been abused or do not feel safe at home. Document Released: 05/06/2011 Document Revised: 03/07/2014 Document Reviewed: 09/22/2013 [facility] Patient Information 2015 [place], [place]. This information is not intended to replace advice given to you by your health care provider. Make sure you discuss any questions you have with your health care provider.

## 2014-11-07 NOTE — Progress Notes (Signed)
Pre-visit discussion using our clinic review tool. No additional management support is needed unless otherwise documented below in the visit note.  

## 2014-11-07 NOTE — Progress Notes (Signed)
Patient ID: Carol [name], female   DOB: [birth year], 74 y.o.   MRN: [id number]  The patient is here for annual Medicare wellness examination and follow up on other chronic and  problems.   1) Breast Cancer:  She recently finished XRT for  Right sided  BRCA daignosed in July after a mass was found on screening mammogram.  She is taking tamoxifen with some side effects of hot flushes and altered sense of taste.  She developed a localized skin rash from the XRT which was consolidated in to 16 sessions .  The eventually resolved with daily use of "recovery" cream and antibacterial soap  2) she was concerned about the fact that she has gained weight, but  advised not to lose any weight by her oncologist,  So she has stopped worrying about it.   3) She was prescribed a protracted course of levaquin for persistent chronic sinusitis secondary to  E coli obtained from a maxillary sinus culture.  There was no improvement in symptoms.  Repeat imaging ruled out any possibility of recurrence of lymphoma.  4) She sees [facility] regularly for management of atrial fib  With RVR,    Heart was fine. tikosyn working , no changes to meds   5) Right eye cataract surgery was done in November.  Waiting to do the left in Jan/Feb   The risk factors are reflected in the social history.  The roster of all physicians providing medical care to patient - is listed in the Snapshot section of the chart.  Activities of daily living:  The patient is 100% independent in all ADLs: dressing, toileting, feeding as well as independent mobility  Home safety : The patient has smoke detectors in the home. They wear seatbelts.  There are no firearms at home. There is no violence in the home.   There is no risks for hepatitis, STDs or HIV. There is no   history of blood transfusion. They have no travel history to infectious disease endemic areas of the world.  The patient has seen their dentist in the last six month.  They have seen their eye doctor in the last year. They admit to slight hearing difficulty with regard to whispered voices and some television programs.  They have deferred audiologic testing in the last year.  They do not  have excessive sun exposure. Discussed the need for sun protection: hats, long sleeves and use of sunscreen if there is significant sun exposure.   Diet: the importance of a healthy diet is discussed. They do have a healthy diet.  The benefits of regular aerobic exercise were discussed. She walks 4 times per week ,  20 minutes.   Depression screen: there are no signs or vegative symptoms of depression- irritability, change in appetite, anhedonia, sadness/tearfullness.  Cognitive assessment: the patient manages all their financial and personal affairs and is actively engaged. They could relate day,date,year and events; recalled 2/3 objects at 3 minutes; performed clock-face test normally.  The following portions of the patient's history were reviewed and updated as appropriate: allergies, current medications, past family history, past medical history,  past surgical history, past social history  and problem list.  Visual acuity was not assessed per patient preference since she has regular follow up with her ophthalmologist. Hearing and body mass index were assessed and reviewed.   During the course of the visit the patient was educated and counseled about appropriate screening and preventive services including : fall prevention , diabetes  screening, nutrition counseling, colorectal cancer screening, and recommended immunizations.    Objective:  BP 118/70 mmHg  Pulse 68  Temp(Src) 97.7 F (36.5 C) (Oral)  Resp 16  Ht 5' 6.5" (1.689 m)  Wt 181 lb (82.101 kg)  BMI 28.78 kg/m2  SpO2 98%   General appearance: alert, cooperative and appears stated age Head: Normocephalic, without obvious abnormality, atraumatic Eyes: conjunctivae/corneas clear. PERRL, EOM's intact. Fundi  benign. Ears: normal TM's and external ear canals both ears Nose: Nares normal. Septum midline. Mucosa normal. No drainage or sinus tenderness. Throat: lips, mucosa, and tongue normal; teeth and gums normal Neck: no adenopathy, no carotid bruit, no JVD, supple, symmetrical, trachea midline and thyroid not enlarged, symmetric, no tenderness/mass/nodules Lungs: clear to auscultation bilaterally Breasts: normal appearance, no masses or tenderness Heart: regular rate and rhythm, S1, S2 normal, no murmur, click, rub or gallop Abdomen: soft, non-tender; bowel sounds normal; no masses,  no organomegaly Extremities: extremities normal, atraumatic, no cyanosis or edema Pulses: 2+ and symmetric Skin: Skin color, texture, turgor normal. No rashes or lesions Neurologic: Alert and oriented X 3, normal strength and tone. Normal symmetric reflexes. Normal coordination and gait.   Assessment and Plan:  Problem List Items Addressed This Visit    Breast cancer    Found on screening mammogram done in July.  S/po lumpectomy, XRT, now on tamoxifen,  Managed by [facility] oncology Dr. Purcell Mouton.  Breast exam today showed a well healed incision scar in right upper outer quadrant and no masses.     Relevant Medications      tamoxifen (NOLVADEX) 20 MG tablet   Hyperlipidemia    New ACC guidelines recommend starting patients aged 35 or higher on moderate intensity statin therapy for LDL between 70-189 and 10 yr risk of CAD > 7.5% ;  and high intensity therapy for anyone with LDL > 190.   Her calculated risk is between 10 to 20% ,  Statin therapy offered but but not strongly advised at this point. Will repeat in 6 months   Lab Results  Component Value Date   CHOL 227* 11/07/2014   HDL 57.00 11/07/2014   LDLCALC 140* 11/07/2014   LDLDIRECT 130.8 11/02/2013   TRIG 151.0* 11/07/2014   CHOLHDL 4 11/07/2014       Relevant Medications      furosemide (LASIX) tablet   Other Relevant Orders      Lipid panel (Completed)    Lymphoma of lymph nodes of head, face, and/or neck    Diagnosed bybiopsy of 2 lesions noted in the right orbital fossa during CT scan done by Dr. Tobe Sos for preorbital persistent  cellulitis.  Diagnosis and treatment is being managed by Dr. Purcell Mouton at [facility] oncology. Recent PET scan showed no reurrence        Relevant Medications      tamoxifen (NOLVADEX) 20 MG tablet   Medicare annual wellness visit, subsequent    Annual Medicare wellness  exam was done as well as a comprehensive physical exam and management of acute and chronic conditions .  During the course of the visit the patient was educated and counseled about appropriate screening and preventive services including : fall prevention , diabetes screening, nutrition counseling, colorectal cancer screening, and recommended immunizations.  Printed recommendations for health maintenance screenings was given.     S/P TAH-BSO (total abdominal hysterectomy and bilateral salpingo-oophorectomy)   Screening for colon cancer - Primary    She is overdue for follow up colonoscopy but her cancer diagnosis  has delayed her repeat exam.  Dr Verta Ellen is managing her screening.      Other Visit Diagnoses    Need for hepatitis C screening test        Relevant Orders       Hepatitis C antibody (Completed)    Vitamin D deficiency        Relevant Orders       Vit D  25 hydroxy (rtn osteoporosis monitoring) (Completed)    Other fatigue        Relevant Orders       CBC with Differential (Completed)       Comprehensive metabolic panel (Completed)       TSH (Completed)

## 2014-11-08 ENCOUNTER — Encounter: Payer: Self-pay | Admitting: Internal Medicine

## 2014-11-08 ENCOUNTER — Encounter: Payer: Self-pay | Admitting: *Deleted

## 2014-11-08 LAB — HEPATITIS C ANTIBODY: HCV Ab: NEGATIVE

## 2014-11-08 NOTE — Assessment & Plan Note (Addendum)
Found on screening mammogram done in July.  S/po lumpectomy, XRT, now on tamoxifen,  Managed by [facility] oncology Dr. Purcell Mouton.  Breast exam today showed a well healed incision scar in right upper outer quadrant and no masses.

## 2014-11-08 NOTE — Assessment & Plan Note (Signed)
Diagnosed bybiopsy of 2 lesions noted in the right orbital fossa during CT scan done by Dr. Tobe Sos for preorbital persistent  cellulitis.  Diagnosis and treatment is being managed by Dr. Purcell Mouton at [facility] oncology. Recent PET scan showed no reurrence

## 2014-11-08 NOTE — Assessment & Plan Note (Signed)
She is overdue for follow up colonoscopy but her cancer diagnosis has delayed her repeat exam.  Dr Verta Ellen is managing her screening.

## 2014-11-08 NOTE — Assessment & Plan Note (Signed)

## 2014-11-08 NOTE — Assessment & Plan Note (Signed)
New ACC guidelines recommend starting patients aged 75 or higher on moderate intensity statin therapy for LDL between 70-189 and 10 yr risk of CAD > 7.5% ;  and high intensity therapy for anyone with LDL > 190.   Her calculated risk is between 10 to 20% ,  Statin therapy offered but but not strongly advised at this point. Will repeat in 6 months   Lab Results  Component Value Date   CHOL 227* 11/07/2014   HDL 57.00 11/07/2014   LDLCALC 140* 11/07/2014   LDLDIRECT 130.8 11/02/2013   TRIG 151.0* 11/07/2014   CHOLHDL 4 11/07/2014

## 2014-11-17 DIAGNOSIS — I4891 Unspecified atrial fibrillation: Secondary | ICD-10-CM | POA: Diagnosis not present

## 2014-11-17 DIAGNOSIS — I480 Paroxysmal atrial fibrillation: Secondary | ICD-10-CM | POA: Diagnosis not present

## 2014-12-01 DIAGNOSIS — I4891 Unspecified atrial fibrillation: Secondary | ICD-10-CM | POA: Diagnosis not present

## 2014-12-07 ENCOUNTER — Ambulatory Visit: Payer: Self-pay | Admitting: Ophthalmology

## 2014-12-07 DIAGNOSIS — H2512 Age-related nuclear cataract, left eye: Secondary | ICD-10-CM | POA: Diagnosis not present

## 2014-12-07 DIAGNOSIS — H269 Unspecified cataract: Secondary | ICD-10-CM | POA: Diagnosis not present

## 2014-12-07 DIAGNOSIS — Z01812 Encounter for preprocedural laboratory examination: Secondary | ICD-10-CM | POA: Diagnosis not present

## 2014-12-07 LAB — POTASSIUM: POTASSIUM: 4.6 mmol/L (ref 3.5–5.1)

## 2014-12-15 DIAGNOSIS — I482 Chronic atrial fibrillation: Secondary | ICD-10-CM | POA: Diagnosis not present

## 2014-12-15 DIAGNOSIS — C8581 Other specified types of non-Hodgkin lymphoma, lymph nodes of head, face, and neck: Secondary | ICD-10-CM | POA: Diagnosis not present

## 2014-12-15 DIAGNOSIS — C8590 Non-Hodgkin lymphoma, unspecified, unspecified site: Secondary | ICD-10-CM | POA: Diagnosis not present

## 2014-12-15 DIAGNOSIS — C50911 Malignant neoplasm of unspecified site of right female breast: Secondary | ICD-10-CM | POA: Diagnosis not present

## 2014-12-15 DIAGNOSIS — Z7901 Long term (current) use of anticoagulants: Secondary | ICD-10-CM | POA: Diagnosis not present

## 2014-12-15 DIAGNOSIS — C50919 Malignant neoplasm of unspecified site of unspecified female breast: Secondary | ICD-10-CM | POA: Diagnosis not present

## 2014-12-15 DIAGNOSIS — Z6828 Body mass index (BMI) 28.0-28.9, adult: Secondary | ICD-10-CM | POA: Diagnosis not present

## 2014-12-16 DIAGNOSIS — I4891 Unspecified atrial fibrillation: Secondary | ICD-10-CM | POA: Diagnosis not present

## 2014-12-27 ENCOUNTER — Telehealth: Payer: Self-pay | Admitting: Internal Medicine

## 2014-12-27 MED ORDER — OMEPRAZOLE 20 MG PO CPDR: 20.0000 mg | DELAYED_RELEASE_CAPSULE | Freq: Every day | ORAL | Status: DC

## 2014-12-27 NOTE — Telephone Encounter (Signed)
Sent refill for omeprazole to [facility] as patient requested, for omeprazole.

## 2015-01-11 DIAGNOSIS — C8590 Non-Hodgkin lymphoma, unspecified, unspecified site: Secondary | ICD-10-CM | POA: Diagnosis not present

## 2015-01-11 DIAGNOSIS — C8599 Non-Hodgkin lymphoma, unspecified, extranodal and solid organ sites: Secondary | ICD-10-CM | POA: Insufficient documentation

## 2015-01-11 DIAGNOSIS — J320 Chronic maxillary sinusitis: Secondary | ICD-10-CM | POA: Diagnosis not present

## 2015-01-12 DIAGNOSIS — I4891 Unspecified atrial fibrillation: Secondary | ICD-10-CM | POA: Diagnosis not present

## 2015-01-30 DIAGNOSIS — E782 Mixed hyperlipidemia: Secondary | ICD-10-CM | POA: Diagnosis not present

## 2015-01-30 DIAGNOSIS — I341 Nonrheumatic mitral (valve) prolapse: Secondary | ICD-10-CM | POA: Diagnosis not present

## 2015-01-30 DIAGNOSIS — I480 Paroxysmal atrial fibrillation: Secondary | ICD-10-CM | POA: Diagnosis not present

## 2015-01-30 DIAGNOSIS — I4891 Unspecified atrial fibrillation: Secondary | ICD-10-CM | POA: Diagnosis not present

## 2015-02-02 ENCOUNTER — Ambulatory Visit
Admit: 2015-02-02 | Disposition: A | Discharge status: Home / Self Care | Payer: Self-pay | Attending: Ophthalmology | Admitting: Ophthalmology

## 2015-02-15 ENCOUNTER — Ambulatory Visit
Admit: 2015-02-15 | Disposition: A | Discharge status: Home / Self Care | Payer: Self-pay | Attending: Ophthalmology | Admitting: Ophthalmology

## 2015-02-15 DIAGNOSIS — H2512 Age-related nuclear cataract, left eye: Secondary | ICD-10-CM | POA: Diagnosis not present

## 2015-02-15 DIAGNOSIS — Z01812 Encounter for preprocedural laboratory examination: Secondary | ICD-10-CM | POA: Diagnosis not present

## 2015-02-15 LAB — POTASSIUM: Potassium: 3.9 mmol/L

## 2015-02-21 ENCOUNTER — Ambulatory Visit
Admit: 2015-02-21 | Disposition: A | Discharge status: Home / Self Care | Payer: Self-pay | Attending: Ophthalmology | Admitting: Ophthalmology

## 2015-02-21 DIAGNOSIS — H2512 Age-related nuclear cataract, left eye: Secondary | ICD-10-CM | POA: Diagnosis not present

## 2015-02-21 DIAGNOSIS — C50919 Malignant neoplasm of unspecified site of unspecified female breast: Secondary | ICD-10-CM | POA: Diagnosis not present

## 2015-02-21 DIAGNOSIS — Z888 Allergy status to other drugs, medicaments and biological substances status: Secondary | ICD-10-CM | POA: Diagnosis not present

## 2015-02-21 DIAGNOSIS — I4891 Unspecified atrial fibrillation: Secondary | ICD-10-CM | POA: Diagnosis not present

## 2015-02-21 DIAGNOSIS — Z885 Allergy status to narcotic agent status: Secondary | ICD-10-CM | POA: Diagnosis not present

## 2015-02-21 DIAGNOSIS — C8590 Non-Hodgkin lymphoma, unspecified, unspecified site: Secondary | ICD-10-CM | POA: Diagnosis not present

## 2015-02-21 DIAGNOSIS — J40 Bronchitis, not specified as acute or chronic: Secondary | ICD-10-CM | POA: Diagnosis not present

## 2015-02-21 DIAGNOSIS — K219 Gastro-esophageal reflux disease without esophagitis: Secondary | ICD-10-CM | POA: Diagnosis not present

## 2015-02-21 DIAGNOSIS — Z79899 Other long term (current) drug therapy: Secondary | ICD-10-CM | POA: Diagnosis not present

## 2015-02-21 DIAGNOSIS — Z7901 Long term (current) use of anticoagulants: Secondary | ICD-10-CM | POA: Diagnosis not present

## 2015-02-25 NOTE — Op Note (Signed)
PATIENT NAME:  Carol [name], Carol [name] MR#:  [id number] DATE OF BIRTH:  [birth year]  DATE OF PROCEDURE:  09/08/2014  PREOPERATIVE DIAGNOSIS:  Nuclear sclerotic cataract of the right eye.   POSTOPERATIVE DIAGNOSIS:  Nuclear sclerotic cataract of the right eye.   OPERATIVE PROCEDURE:  Cataract extraction by phacoemulsification with implant of intraocular lens to right eye.   SURGEON:  Birder Robson, MD.   ANESTHESIA:  1. Managed anesthesia care.  2. Topical tetracaine drops followed by 2% Xylocaine jelly applied in the preoperative holding area.   COMPLICATIONS:  None.   TECHNIQUE:  Stop and chop.    DESCRIPTION OF PROCEDURE:  The patient was examined and consented in the preoperative holding area where the aforementioned topical anesthesia was applied to the right eye and then brought back to the Operating Room where the right eye was prepped and draped in the usual sterile ophthalmic fashion and a lid speculum was placed. A paracentesis was created with the side port blade and the anterior chamber was filled with viscoelastic. A near clear corneal incision was performed with the steel keratome. A continuous curvilinear capsulorrhexis was performed with a cystotome followed by the capsulorrhexis forceps. Hydrodissection and hydrodelineation were carried out with BSS on a blunt cannula. The lens was removed in a stop and chop technique and the remaining cortical material was removed with the irrigation-aspiration handpiece. The capsular bag was inflated with viscoelastic and the Tecnis ZCB00 19.0-diopter lens, serial number [id number] was placed in the capsular bag without complication. The remaining viscoelastic was removed from the eye with the irrigation-aspiration handpiece. The wounds were hydrated. The anterior chamber was flushed with Miostat and the eye was inflated to physiologic pressure. 0.1 mL of cefuroxime concentration 10 mg/mL was placed in the anterior chamber. The wounds were found to be  water tight. The eye was dressed with Vigamox. The patient was given protective glasses to wear throughout the day and a shield with which to sleep tonight. The patient was also given drops with which to begin a drop regimen today and will follow-up with me in one day.    ____________________________ Livingston Diones. Xeng Kucher, MD wlp:bu D: 09/08/2014 16:49:45 ET T: 09/08/2014 17:21:52 ET JOB#: [id number]  cc: Toshiko Kemler L. Abi Shoults, MD, <Dictator> Livingston Diones Pistol Kessenich MD ELECTRONICALLY SIGNED 09/09/2014 10:56

## 2015-02-28 DIAGNOSIS — I4891 Unspecified atrial fibrillation: Secondary | ICD-10-CM | POA: Diagnosis not present

## 2015-03-03 DIAGNOSIS — R922 Inconclusive mammogram: Secondary | ICD-10-CM | POA: Diagnosis not present

## 2015-03-03 DIAGNOSIS — C50911 Malignant neoplasm of unspecified site of right female breast: Secondary | ICD-10-CM | POA: Diagnosis not present

## 2015-03-05 NOTE — Op Note (Signed)
PATIENT NAME:  Carol [name], Carol [name] MR#:  [id number] DATE OF BIRTH:  [birth year]  DATE OF PROCEDURE:  02/21/2015  PREOPERATIVE DIAGNOSIS:  Nuclear sclerotic cataract of the left eye.   POSTOPERATIVE DIAGNOSIS:  Nuclear sclerotic cataract of the left eye.   OPERATIVE PROCEDURE:  Cataract extraction by phacoemulsification with implant of intraocular lens to left eye.   SURGEON:  Livingston Diones. Analya Louissaint, MD  ANESTHESIA:  1. Managed anesthesia care.  2. Topical tetracaine drops followed by 2% Xylocaine jelly applied in the preoperative holding area.   COMPLICATIONS:  None.   TECHNIQUE:  Stop and chop.  DESCRIPTION OF PROCEDURE:  The patient was examined and consented in the preoperative holding area where the aforementioned topical anesthesia was applied to the left eye and then brought back to the operating room, where the left eye was prepped and draped in the usual sterile ophthalmic fashion and a lid speculum was placed. A paracentesis was created with the side port blade and the anterior chamber was filled with viscoelastic. A near clear corneal incision was performed with the steel keratome. A continuous curvilinear capsulorrhexis was performed with a cystotome followed by the capsulorrhexis forceps. Hydrodissection and hydrodelineation were carried out with BSS on a blunt cannula. The lens was removed in a stop and chop technique and the remaining cortical material was removed with the irrigation-aspiration handpiece. The capsular bag was inflated with viscoelastic and the Tecnis ZCB00, 19.0-diopter lens, serial number [id number], was placed in the capsular bag without complication. The remaining viscoelastic was removed from the eye with the irrigation-aspiration handpiece. The wounds were hydrated. The anterior chamber was flushed with Miostat and the eye was inflated to physiologic pressure. 0.1 mL of cefuroxime concentration 10 mg/mL was placed in the anterior chamber. The wounds were found to be  water tight. The eye was dressed with Vigamox. The patient was given protective glasses to wear throughout the day and a shield with which to sleep tonight. The patient was also given drops with which to begin a drop regimen today and will follow up with me in one day.    ____________________________ Livingston Diones. Whitley Patchen, MD wlp:nb D: 02/21/2015 21:01:04 ET T: 02/21/2015 22:28:33 ET JOB#: [id number]  cc: Aviona Martenson L. Kamaya Keckler, MD, <Dictator> Livingston Diones Dasean Brow MD ELECTRONICALLY SIGNED 02/22/2015 14:08

## 2015-03-28 DIAGNOSIS — I482 Chronic atrial fibrillation: Secondary | ICD-10-CM | POA: Diagnosis not present

## 2015-04-24 DIAGNOSIS — I482 Chronic atrial fibrillation: Secondary | ICD-10-CM | POA: Diagnosis not present

## 2015-05-02 DIAGNOSIS — I341 Nonrheumatic mitral (valve) prolapse: Secondary | ICD-10-CM | POA: Diagnosis not present

## 2015-05-02 DIAGNOSIS — I480 Paroxysmal atrial fibrillation: Secondary | ICD-10-CM | POA: Diagnosis not present

## 2015-05-02 DIAGNOSIS — I482 Chronic atrial fibrillation: Secondary | ICD-10-CM | POA: Diagnosis not present

## 2015-05-02 DIAGNOSIS — E782 Mixed hyperlipidemia: Secondary | ICD-10-CM | POA: Diagnosis not present

## 2015-05-15 ENCOUNTER — Ambulatory Visit (INDEPENDENT_AMBULATORY_CARE_PROVIDER_SITE_OTHER): Payer: Medicare Other | Admitting: Internal Medicine

## 2015-05-15 ENCOUNTER — Encounter: Payer: Self-pay | Admitting: Internal Medicine

## 2015-05-15 VITALS — BP 120/68 | HR 96 | Temp 98.0°F | Resp 16 | Ht 66.5 in | Wt 167.8 lb

## 2015-05-15 DIAGNOSIS — J440 Chronic obstructive pulmonary disease with acute lower respiratory infection: Secondary | ICD-10-CM | POA: Insufficient documentation

## 2015-05-15 DIAGNOSIS — Z7901 Long term (current) use of anticoagulants: Secondary | ICD-10-CM | POA: Diagnosis not present

## 2015-05-15 DIAGNOSIS — E785 Hyperlipidemia, unspecified: Secondary | ICD-10-CM | POA: Diagnosis not present

## 2015-05-15 DIAGNOSIS — J209 Acute bronchitis, unspecified: Secondary | ICD-10-CM | POA: Diagnosis not present

## 2015-05-15 MED ORDER — FUROSEMIDE 20 MG PO TABS: ORAL_TABLET | ORAL | Status: DC

## 2015-05-15 MED ORDER — ALBUTEROL SULFATE HFA 108 (90 BASE) MCG/ACT IN AERS: 2.0000 | INHALATION_SPRAY | Freq: Four times a day (QID) | RESPIRATORY_TRACT | Status: DC | PRN

## 2015-05-15 MED ORDER — DOXYCYCLINE HYCLATE 100 MG PO CAPS: 100.0000 mg | ORAL_CAPSULE | Freq: Two times a day (BID) | ORAL | Status: DC

## 2015-05-15 NOTE — Progress Notes (Signed)
Subjective:  Patient ID: Carol [name], female    DOB: [birth year]  Age: 74 y.o. MRN: [id number]  CC: The primary encounter diagnosis was Acute bronchitis, unspecified organism. Diagnoses of Hyperlipidemia and Long term current use of anticoagulant therapy were also pertinent to this visit.  HPI Carol [name] presents for chronic conditions including  recent episode f URI,  breast CA,  atrial fib,  chronic sinusitis, and orbital lymphoma.   Her BRCA managed by [facility],  completed XRT in November 2015 and is taking tamoxifen as adjuvant therapy. .   Last Mammogram was BIRADS 3 in April.   Weight gain:  Secondary to tamoxifen .  She has lost 14 lbs since January by cutting out sweets    2 week history of chest cold,  Sputum is yellow,  no sinus drainage,  No recent travel or sick contacts. Noticed that during  push mowing the lawn last weekend she  got very short of breath.  Has not had to use the albuterol,  But has heard some ronchi  at night  which clears with cough.      Outpatient Prescriptions Prior to Visit  Medication Sig Dispense Refill  . Bisacodyl (LAXATIVE PO) Take 1 tablet by mouth daily as needed.     . diltiazem (CARDIZEM) 60 MG tablet Take 1 tablet (60 mg total) by mouth as needed. 90 tablet 1  . diphenhydrAMINE (BENADRYL) 25 mg capsule Take 25 mg by mouth every 6 (six) hours as needed.    . dofetilide (TIKOSYN) 250 MCG capsule Take 250 mcg by mouth 2 (two) times daily.    Marland Kitchen ibuprofen (ADVIL,MOTRIN) 200 MG tablet Take 200 mg by mouth every 6 (six) hours as needed.    . Magnesium 400 MG CAPS Take 1 capsule by mouth 2 (two) times daily. 180 capsule 3  . metoprolol (LOPRESSOR) 50 MG tablet Take 1 tablet (50 mg total) by mouth 2 (two) times daily. 180 tablet 3  . omeprazole (PRILOSEC) 20 MG capsule Take 1 capsule (20 mg total) by mouth daily. 90 capsule 2  . tamoxifen (NOLVADEX) 20 MG tablet Take 20 mg by mouth daily.    . Tdap (BOOSTRIX) 5-2.5-18.5 LF-MCG/0.5  injection Inject 0.5 mLs into the muscle once. 0.5 mL 0  . warfarin (COUMADIN) 6 MG tablet Take 6 mg by mouth daily.    Marland Kitchen albuterol (PROVENTIL HFA;VENTOLIN HFA) 108 (90 BASE) MCG/ACT inhaler Inhale 2 puffs into the lungs every 6 (six) hours as needed. 18 g 3  . furosemide (LASIX) 20 MG tablet TAKE ONE TABLET BY MOUTH EVERY DAY 90 tablet 2   No facility-administered medications prior to visit.    Review of Systems;  Patient denies headache, fevers, malaise, unintentional weight loss, skin rash, eye pain, sinus congestion and sinus pain, sore throat, dysphagia,  hemoptysis , cough, dyspnea, wheezing, chest pain, palpitations, orthopnea, edema, abdominal pain, nausea, melena, diarrhea, constipation, flank pain, dysuria, hematuria, urinary  Frequency, nocturia, numbness, tingling, seizures,  Focal weakness, Loss of consciousness,  Tremor, insomnia, depression, anxiety, and suicidal ideation.      Objective:  BP 120/68 mmHg  Pulse 96  Temp(Src) 98 F (36.7 C) (Oral)  Resp 16  Ht 5' 6.5" (1.689 m)  Wt 167 lb 12.8 oz (76.114 kg)  BMI 26.68 kg/m2  SpO2 97%  BP Readings from Last 3 Encounters:  05/15/15 120/68  11/07/14 118/70  05/16/14 116/72    Wt Readings from Last 3 Encounters:  05/15/15 167 lb 12.8 oz (  76.114 kg)  11/07/14 181 lb (82.101 kg)  05/16/14 172 lb 8 oz (78.245 kg)    General appearance: alert, cooperative and appears stated age Ears: normal TM's and external ear canals both ears Throat: lips, mucosa, and tongue normal; teeth and gums normal Neck: no adenopathy, no carotid bruit, supple, symmetrical, trachea midline and thyroid not enlarged, symmetric, no tenderness/mass/nodules Back: symmetric, no curvature. ROM normal. No CVA tenderness. Lungs: clear to auscultation bilaterally Heart: regular rate and rhythm, S1, S2 normal, no murmur, click, rub or gallop Abdomen: soft, non-tender; bowel sounds normal; no masses,  no organomegaly Pulses: 2+ and symmetric Skin:  Skin color, texture, turgor normal. No rashes or lesions Lymph nodes: Cervical, supraclavicular, and axillary nodes normal.  No results found for: HGBA1C  Lab Results  Component Value Date   CREATININE 0.7 11/07/2014   CREATININE 0.7 11/02/2013   CREATININE 0.8 03/27/2012    Lab Results  Component Value Date   WBC 5.9 11/07/2014   HGB 13.1 11/07/2014   HCT 39.5 11/07/2014   PLT 248.0 11/07/2014   GLUCOSE 92 11/07/2014   CHOL 227* 11/07/2014   TRIG 151.0* 11/07/2014   HDL 57.00 11/07/2014   LDLDIRECT 130.8 11/02/2013   LDLCALC 140* 11/07/2014   ALT 22 11/07/2014   AST 25 11/07/2014   NA 140 11/07/2014   K 3.9 02/15/2015   CL 103 11/07/2014   CREATININE 0.7 11/07/2014   BUN 11 11/07/2014   CO2 27 11/07/2014   TSH 1.88 11/07/2014   INR 2.62* 02/12/2012    No results found.  Assessment & Plan:   Problem List Items Addressed This Visit      Unprioritized   Hyperlipidemia    New ACC guidelines recommend starting patients aged 29 or higher on moderate intensity statin therapy for LDL between 70-189 and 10 yr risk of CAD > 7.5% ;  and high intensity therapy for anyone with LDL > 190.   Her calculated risk is between 10 to 20% ,  Statin therapy was offered but but not strongly advised at this point. Will repeat .  Lab Results  Component Value Date   CHOL 227* 11/07/2014   HDL 57.00 11/07/2014   LDLCALC 140* 11/07/2014   LDLDIRECT 130.8 11/02/2013   TRIG 151.0* 11/07/2014   CHOLHDL 4 11/07/2014           Relevant Medications   furosemide (LASIX) 20 MG tablet   Other Relevant Orders   Comprehensive metabolic panel   Lipid panel   TSH   Acute bronchitis - Primary    She is not wheezing on exam but has had persistent productive cough x 2 weeks,  Exertional dyspnea and history of lymphoma of head/neck.  Will treat empirically with doxycycline , prn albuterol .  Probiotic advised.       Long term current use of anticoagulant therapy    Patient advised to suspend  coumadin every 3rd day while on doxycycline to avoid supratherapeutic INR>          I am having Ms. Ek start on doxycycline. I am also having her maintain her warfarin, diphenhydrAMINE, Bisacodyl (LAXATIVE PO), ibuprofen, dofetilide, metoprolol, diltiazem, Magnesium, tamoxifen, Tdap, omeprazole, albuterol, and furosemide.  Meds ordered this encounter  Medications  . doxycycline (VIBRAMYCIN) 100 MG capsule    Sig: Take 1 capsule (100 mg total) by mouth 2 (two) times daily. TAKE WITH FOOD    Dispense:  14 capsule    Refill:  0  . albuterol (PROVENTIL HFA;VENTOLIN  HFA) 108 (90 BASE) MCG/ACT inhaler    Sig: Inhale 2 puffs into the lungs every 6 (six) hours as needed.    Dispense:  18 g    Refill:  0  . furosemide (LASIX) 20 MG tablet    Sig: TAKE ONE TABLET BY MOUTH EVERY DAY    Dispense:  90 tablet    Refill:  2    Medications Discontinued During This Encounter  Medication Reason  . albuterol (PROVENTIL HFA;VENTOLIN HFA) 108 (90 BASE) MCG/ACT inhaler Reorder  . furosemide (LASIX) 20 MG tablet Reorder    Follow-up: Return in about 6 months (around 11/15/2015).   Crecencio Mc, MD

## 2015-05-15 NOTE — Patient Instructions (Signed)
I am prescribing a 7 day course of  DOXYCYCLINE,  And albuterol inhaler if needed for wheezing/chest tightness (use every 6 hours if needed).    I also recommending using Simply Saline nasal spray twice daily to flush your sinuses out. (available OTC) if you are having drainage   Your coumadin level may increase while taking the doxy; You can skip your coumadin dose on day 3 and day 6 or have it checked early

## 2015-05-15 NOTE — Progress Notes (Signed)
Pre visit review using our clinic review tool, if applicable. No additional management support is needed unless otherwise documented below in the visit note. 

## 2015-05-16 DIAGNOSIS — Z7901 Long term (current) use of anticoagulants: Secondary | ICD-10-CM | POA: Insufficient documentation

## 2015-05-16 DIAGNOSIS — D6869 Other thrombophilia: Secondary | ICD-10-CM | POA: Insufficient documentation

## 2015-05-16 NOTE — Assessment & Plan Note (Signed)
She is not wheezing on exam but has had persistent productive cough x 2 weeks,  Exertional dyspnea and history of lymphoma of head/neck.  Will treat empirically with doxycycline , prn albuterol .  Probiotic advised.

## 2015-05-16 NOTE — Assessment & Plan Note (Signed)
New ACC guidelines recommend starting patients aged 75 or higher on moderate intensity statin therapy for LDL between 70-189 and 10 yr risk of CAD > 7.5% ;  and high intensity therapy for anyone with LDL > 190.   Her calculated risk is between 10 to 20% ,  Statin therapy was offered but but not strongly advised at this point. Will repeat .  Lab Results  Component Value Date   CHOL 227* 11/07/2014   HDL 57.00 11/07/2014   LDLCALC 140* 11/07/2014   LDLDIRECT 130.8 11/02/2013   TRIG 151.0* 11/07/2014   CHOLHDL 4 11/07/2014

## 2015-05-16 NOTE — Assessment & Plan Note (Signed)
Patient advised to suspend coumadin every 3rd day while on doxycycline to avoid supratherapeutic INR>

## 2015-05-25 DIAGNOSIS — I482 Chronic atrial fibrillation: Secondary | ICD-10-CM | POA: Diagnosis not present

## 2015-06-15 DIAGNOSIS — Z08 Encounter for follow-up examination after completed treatment for malignant neoplasm: Secondary | ICD-10-CM | POA: Diagnosis not present

## 2015-06-15 DIAGNOSIS — C50911 Malignant neoplasm of unspecified site of right female breast: Secondary | ICD-10-CM | POA: Diagnosis not present

## 2015-06-15 DIAGNOSIS — C8581 Other specified types of non-Hodgkin lymphoma, lymph nodes of head, face, and neck: Secondary | ICD-10-CM | POA: Diagnosis not present

## 2015-06-15 DIAGNOSIS — N951 Menopausal and female climacteric states: Secondary | ICD-10-CM | POA: Diagnosis not present

## 2015-06-15 DIAGNOSIS — Z792 Long term (current) use of antibiotics: Secondary | ICD-10-CM | POA: Diagnosis not present

## 2015-06-15 DIAGNOSIS — Z853 Personal history of malignant neoplasm of breast: Secondary | ICD-10-CM | POA: Diagnosis not present

## 2015-06-15 DIAGNOSIS — Z8572 Personal history of non-Hodgkin lymphomas: Secondary | ICD-10-CM | POA: Diagnosis not present

## 2015-06-15 DIAGNOSIS — Z9889 Other specified postprocedural states: Secondary | ICD-10-CM | POA: Diagnosis not present

## 2015-06-15 DIAGNOSIS — I482 Chronic atrial fibrillation: Secondary | ICD-10-CM | POA: Diagnosis not present

## 2015-06-15 DIAGNOSIS — Z7901 Long term (current) use of anticoagulants: Secondary | ICD-10-CM | POA: Diagnosis not present

## 2015-06-15 DIAGNOSIS — Z9221 Personal history of antineoplastic chemotherapy: Secondary | ICD-10-CM | POA: Diagnosis not present

## 2015-06-15 DIAGNOSIS — Z79810 Long term (current) use of selective estrogen receptor modulators (SERMs): Secondary | ICD-10-CM | POA: Diagnosis not present

## 2015-06-15 DIAGNOSIS — Z923 Personal history of irradiation: Secondary | ICD-10-CM | POA: Diagnosis not present

## 2015-06-18 DIAGNOSIS — N390 Urinary tract infection, site not specified: Secondary | ICD-10-CM | POA: Diagnosis not present

## 2015-06-29 DIAGNOSIS — I482 Chronic atrial fibrillation: Secondary | ICD-10-CM | POA: Diagnosis not present

## 2015-07-24 DIAGNOSIS — C8581 Other specified types of non-Hodgkin lymphoma, lymph nodes of head, face, and neck: Secondary | ICD-10-CM | POA: Diagnosis not present

## 2015-08-02 DIAGNOSIS — I482 Chronic atrial fibrillation: Secondary | ICD-10-CM | POA: Diagnosis not present

## 2015-08-02 DIAGNOSIS — I480 Paroxysmal atrial fibrillation: Secondary | ICD-10-CM | POA: Diagnosis not present

## 2015-08-09 ENCOUNTER — Other Ambulatory Visit: Payer: Self-pay | Admitting: Internal Medicine

## 2015-08-30 DIAGNOSIS — N649 Disorder of breast, unspecified: Secondary | ICD-10-CM | POA: Diagnosis not present

## 2015-08-30 DIAGNOSIS — Z8572 Personal history of non-Hodgkin lymphomas: Secondary | ICD-10-CM | POA: Diagnosis not present

## 2015-08-30 DIAGNOSIS — I4891 Unspecified atrial fibrillation: Secondary | ICD-10-CM | POA: Diagnosis not present

## 2015-08-30 DIAGNOSIS — J45909 Unspecified asthma, uncomplicated: Secondary | ICD-10-CM | POA: Diagnosis not present

## 2015-08-30 DIAGNOSIS — R928 Other abnormal and inconclusive findings on diagnostic imaging of breast: Secondary | ICD-10-CM | POA: Diagnosis not present

## 2015-08-30 DIAGNOSIS — Z23 Encounter for immunization: Secondary | ICD-10-CM | POA: Diagnosis not present

## 2015-08-30 DIAGNOSIS — C50911 Malignant neoplasm of unspecified site of right female breast: Secondary | ICD-10-CM | POA: Diagnosis not present

## 2015-10-05 DIAGNOSIS — I482 Chronic atrial fibrillation: Secondary | ICD-10-CM | POA: Diagnosis not present

## 2015-10-23 DIAGNOSIS — Z79899 Other long term (current) drug therapy: Secondary | ICD-10-CM | POA: Diagnosis not present

## 2015-10-23 DIAGNOSIS — I480 Paroxysmal atrial fibrillation: Secondary | ICD-10-CM | POA: Diagnosis not present

## 2015-10-23 DIAGNOSIS — I482 Chronic atrial fibrillation: Secondary | ICD-10-CM | POA: Diagnosis not present

## 2015-11-01 DIAGNOSIS — C44519 Basal cell carcinoma of skin of other part of trunk: Secondary | ICD-10-CM | POA: Diagnosis not present

## 2015-11-01 DIAGNOSIS — C44712 Basal cell carcinoma of skin of right lower limb, including hip: Secondary | ICD-10-CM | POA: Diagnosis not present

## 2015-11-01 DIAGNOSIS — L821 Other seborrheic keratosis: Secondary | ICD-10-CM | POA: Diagnosis not present

## 2015-11-01 DIAGNOSIS — D485 Neoplasm of uncertain behavior of skin: Secondary | ICD-10-CM | POA: Diagnosis not present

## 2015-11-02 DIAGNOSIS — Z961 Presence of intraocular lens: Secondary | ICD-10-CM | POA: Diagnosis not present

## 2015-11-15 ENCOUNTER — Ambulatory Visit: Payer: No Typology Code available for payment source | Admitting: Internal Medicine

## 2015-11-20 DIAGNOSIS — I482 Chronic atrial fibrillation: Secondary | ICD-10-CM | POA: Diagnosis not present

## 2015-11-27 ENCOUNTER — Ambulatory Visit: Payer: No Typology Code available for payment source | Admitting: Internal Medicine

## 2015-11-27 ENCOUNTER — Ambulatory Visit (INDEPENDENT_AMBULATORY_CARE_PROVIDER_SITE_OTHER): Payer: Medicare Other

## 2015-11-27 VITALS — BP 116/72 | HR 86 | Temp 97.7°F | Resp 14 | Ht 66.0 in | Wt 176.8 lb

## 2015-11-27 DIAGNOSIS — Z23 Encounter for immunization: Secondary | ICD-10-CM | POA: Diagnosis not present

## 2015-11-27 DIAGNOSIS — E785 Hyperlipidemia, unspecified: Secondary | ICD-10-CM

## 2015-11-27 DIAGNOSIS — Z0000 Encounter for general adult medical examination without abnormal findings: Secondary | ICD-10-CM | POA: Diagnosis not present

## 2015-11-27 LAB — COMPREHENSIVE METABOLIC PANEL
ALBUMIN: 4.4 g/dL (ref 3.5–5.2)
ALT: 23 U/L (ref 0–35)
AST: 23 U/L (ref 0–37)
Alkaline Phosphatase: 48 U/L (ref 39–117)
BUN: 14 mg/dL (ref 6–23)
CALCIUM: 9.2 mg/dL (ref 8.4–10.5)
CO2: 27 mEq/L (ref 19–32)
Chloride: 104 mEq/L (ref 96–112)
Creatinine, Ser: 0.82 mg/dL (ref 0.40–1.20)
GFR: 72.19 mL/min (ref >60.00)
Glucose, Bld: 93 mg/dL (ref 70–99)
Potassium: 4.4 mEq/L (ref 3.5–5.1)
Sodium: 141 mEq/L (ref 135–145)
TOTAL PROTEIN: 6.6 g/dL (ref 6.0–8.3)
Total Bilirubin: 0.6 mg/dL (ref 0.2–1.2)

## 2015-11-27 LAB — LIPID PANEL
CHOLESTEROL: 206 mg/dL — AB (ref 0–200)
HDL: 58.2 mg/dL (ref >39.00)
LDL Cholesterol: 115 mg/dL — ABNORMAL HIGH (ref 0–99)
NonHDL: 147.67
TRIGLYCERIDES: 164 mg/dL — AB (ref 0.0–149.0)
Total CHOL/HDL Ratio: 4
VLDL: 32.8 mg/dL (ref 0.0–40.0)

## 2015-11-27 LAB — TSH: TSH: 1.21 u[IU]/mL (ref 0.35–4.50)

## 2015-11-27 NOTE — Progress Notes (Signed)
Subjective:   Carol [name] is a 75 y.o. female who presents for Medicare Annual (Subsequent) preventive examination.  Review of Systems:  No ROS.  Medicare Wellness Visit.  Cardiac Risk Factors include: advanced age (>59men, >3 women)     Objective:      Vitals: BP 116/72 mmHg  Pulse 86  Temp(Src) 97.7 F (36.5 C) (Oral)  Resp 14  Ht 5\' 6"  (1.676 m)  Wt 176 lb 12.8 oz (80.196 kg)  BMI 28.55 kg/m2  SpO2 97%  Tobacco History  Smoking status  . Never Smoker   Smokeless tobacco  . Never Used     Counseling given: Not Answered   Past Medical History  Diagnosis Date  . History of pneumonia 1999  . Lichen planus   . Irritable bowel syndrome   . Chronic sinusitis   . Moderate mitral insufficiency JAN 2012    E[facility], [place]  . Mild tricuspid insufficiency Jan 2012    [facility], [place]  . Hyperlipidemia   . Fibrocystic breast disease   . lymphoma August 2013    Low grade B cell   Past Surgical History  Procedure Laterality Date  . Maxillary sinus lift  1990's    Carol [name]  . Oophorectomy    . Breast surgery      bilateral, benign biopsies  . Abdominal hysterectomy      precancerous cervix,    . Ablation of dysrhythmic focus  August 2013    [facility], Dr. Marcello Moores   Family History  Problem Relation Age of Onset  . Cancer Mother     Breast  . Hyperlipidemia Father   . Heart disease Father   . Hypertension Father   . Kidney disease Father   . Cancer Maternal Grandfather     colon CA   History  Sexual Activity  . Sexual Activity: No    Outpatient Encounter Prescriptions as of 11/27/2015  Medication Sig  . albuterol (PROVENTIL HFA;VENTOLIN HFA) 108 (90 BASE) MCG/ACT inhaler Inhale 2 puffs into the lungs every 6 (six) hours as needed.  . Bisacodyl (LAXATIVE PO) Take 1 tablet by mouth daily as needed.   . diltiazem (CARDIZEM) 60 MG tablet Take 1 tablet (60 mg total) by mouth as needed.  . diphenhydrAMINE (BENADRYL) 25 mg capsule Take 25 mg by  mouth every 6 (six) hours as needed.  . dofetilide (TIKOSYN) 250 MCG capsule Take 250 mcg by mouth 2 (two) times daily.  . furosemide (LASIX) 20 MG tablet TAKE ONE TABLET BY MOUTH EVERY DAY  . ibuprofen (ADVIL,MOTRIN) 200 MG tablet Take 200 mg by mouth every 6 (six) hours as needed.  . Magnesium 400 MG CAPS Take 1 capsule by mouth 2 (two) times daily.  . metoprolol tartrate (LOPRESSOR) 25 MG tablet Take 25 mg by mouth 2 (two) times daily.  Marland Kitchen omeprazole (PRILOSEC) 20 MG capsule TAKE 1 CAPSULE EVERY DAY  . tamoxifen (NOLVADEX) 20 MG tablet Take 20 mg by mouth daily.  . Tdap (BOOSTRIX) 5-2.5-18.5 LF-MCG/0.5 injection Inject 0.5 mLs into the muscle once.  . warfarin (COUMADIN) 1 MG tablet Take 1 mg by mouth daily.  Marland Kitchen warfarin (COUMADIN) 6 MG tablet Take 6 mg by mouth daily.  . [DISCONTINUED] doxycycline (VIBRAMYCIN) 100 MG capsule Take 1 capsule (100 mg total) by mouth 2 (two) times daily. TAKE WITH FOOD  . [DISCONTINUED] metoprolol (LOPRESSOR) 50 MG tablet Take 1 tablet (50 mg total) by mouth 2 (two) times daily.   No facility-administered encounter medications on  file as of 11/27/2015.    Activities of Daily Living In your present state of health, do you have any difficulty performing the following activities: 11/27/2015  Hearing? N  Vision? N  Difficulty concentrating or making decisions? N  Walking or climbing stairs? N  Dressing or bathing? N  Doing errands, shopping? N  Preparing Food and eating ? N  Using the Toilet? N  In the past six months, have you accidently leaked urine? N  Do you have problems with loss of bowel control? N  Managing your Medications? N  Managing your Finances? N  Housekeeping or managing your Housekeeping? N    Patient Care Team: Crecencio Mc, MD as PCP - General (Internal Medicine) Corey Skains, MD as Referring Physician (Internal Medicine)    Assessment:   This is a routine wellness examination for Carol [name]. The goal of the wellness visit is to  assist the patient how to close the gaps in care and create a preventative care plan for the patient.   Osteoporosis risk reviewed.  Medications reviewed; taking without issues or barriers.   Safety issues reviewed; smoke detectors in the home. No firearms in the home. Wears seatbelts when driving or riding with others. No violence in the home.  No identified risk were noted; The patient was oriented x 3; appropriate in dress and manner and no objective failures at ADL's or IADL's.   Malign neopl breast-stable and followed by Dr. Lonia Chimera and PCP Lymphomas NEC he-stable and followed by Dr. Lonia Chimera and PCP Malignant neoplasm-stable and followed by Dr. Lonia Chimera and PCP Non-Hodgkin lymph-stable and followed by Dr. Lonia Chimera and PCP Atrial fibrillation-stable and followed by Dr. Nehemiah Massed and PCP Unspecified atrial fibrillation-stable and followed by Dr. Nehemiah Massed and PCP  TDAP vaccine postponed for insurance follow up, per patient request.  Patient Concerns:  CPE; appointment scheduled 11/28/15, labs completed today.  Joint pain; concerned it is a result of tamoxifen.  Sore throat; left side only and worse at night.  Deferred to follow up with PCP.  Exercise Activities and Dietary recommendations Current Exercise Habits:: Home exercise routine, Type of exercise: walking;exercise ball;stretching, Time (Minutes): 30, Frequency (Times/Week): 3, Weekly Exercise (Minutes/Week): 90, Intensity: Mild  Goals    . Healthy Lifestyle     Stay hydrated, continue drinking plenty of fluids Eat a healthy diet, choose lean meats, fresh fruits and vegetables Maintain exercise regiment      Fall Risk Fall Risk  11/27/2015 11/07/2014  Falls in the past year? Yes No  Number falls in past yr: 1 -  Injury with Fall? No -  Follow up Education provided;Falls prevention discussed -   Depression Screen PHQ 2/9 Scores 11/27/2015 11/07/2014  PHQ - 2 Score 0 0     Cognitive Testing MMSE - Mini Mental  State Exam 11/27/2015  Orientation to time 5  Orientation to Place 5  Registration 3  Attention/ Calculation 5  Recall 3  Language- name 2 objects 2  Language- repeat 1  Language- follow 3 step command 3  Language- read & follow direction 1  Write a sentence 1  Copy design 1  Total score 30    Immunization History  Administered Date(s) Administered  . Influenza-Unspecified 08/04/2013, 08/07/2014, 09/19/2015  . Pneumococcal Conjugate-13 11/27/2015  . Pneumococcal Polysaccharide-23 11/05/2013  . Zoster 05/17/2008   Screening Tests Health Maintenance  Topic Date Due  . TETANUS/TDAP  09/05/1959  . INFLUENZA VACCINE  06/04/2016  . COLONOSCOPY  02/11/2017  . DEXA SCAN  Completed  . ZOSTAVAX  Completed  . PNA vac Low Risk Adult  Completed      Plan:    End of life planning; Advance aging; Advanced directives discussed. Copy of HCPOA/Living Will requested.  During the course of the visit the patient was educated and counseled about the following appropriate screening and preventive services:   Vaccines to include Pneumoccal, Influenza, Hepatitis B, Td, Zostavax, HCV  Electrocardiogram  Cardiovascular Disease  Colorectal cancer screening  Bone density screening  Diabetes screening  Glaucoma screening  Mammography/PAP  Nutrition counseling   Patient Instructions (the written plan) was given to the patient.   Varney Biles, LPN  [phone]

## 2015-11-27 NOTE — Patient Instructions (Addendum)
Carol [name],  Thank you for taking time to come for your Medicare Wellness Visit.  I appreciate your ongoing commitment to your health goals. Please review the following plan we discussed and let me know if I can assist you in the future.  Return for physical tomorrow.   Fall Prevention in the Home  Falls can cause injuries. They can happen to people of all ages. There are many things you can do to make your home safe and to help prevent falls.  WHAT CAN I DO ON THE OUTSIDE OF MY HOME?  Regularly fix the edges of walkways and driveways and fix any cracks.  Remove anything that might make you trip as you walk through a door, such as a raised step or threshold.  Trim any bushes or trees on the path to your home.  Use bright outdoor lighting.  Clear any walking paths of anything that might make someone trip, such as rocks or tools.  Regularly check to see if handrails are loose or broken. Make sure that both sides of any steps have handrails.  Any raised decks and porches should have guardrails on the edges.  Have any leaves, snow, or ice cleared regularly.  Use sand or salt on walking paths during winter.  Clean up any spills in your garage right away. This includes oil or grease spills. WHAT CAN I DO IN THE BATHROOM?   Use night lights.  Install grab bars by the toilet and in the tub and shower. Do not use towel bars as grab bars.  Use non-skid mats or decals in the tub or shower.  If you need to sit down in the shower, use a plastic, non-slip stool.  Keep the floor dry. Clean up any water that spills on the floor as soon as it happens.  Remove soap buildup in the tub or shower regularly.  Attach bath mats securely with double-sided non-slip rug tape.  Do not have throw rugs and other things on the floor that can make you trip. WHAT CAN I DO IN THE BEDROOM?  Use night lights.  Make sure that you have a light by your bed that is easy to reach.  Do not use any  sheets or blankets that are too big for your bed. They should not hang down onto the floor.  Have a firm chair that has side arms. You can use this for support while you get dressed.  Do not have throw rugs and other things on the floor that can make you trip. WHAT CAN I DO IN THE KITCHEN?  Clean up any spills right away.  Avoid walking on wet floors.  Keep items that you use a lot in easy-to-reach places.  If you need to reach something above you, use a strong step stool that has a grab bar.  Keep electrical cords out of the way.  Do not use floor polish or wax that makes floors slippery. If you must use wax, use non-skid floor wax.  Do not have throw rugs and other things on the floor that can make you trip. WHAT CAN I DO WITH MY STAIRS?  Do not leave any items on the stairs.  Make sure that there are handrails on both sides of the stairs and use them. Fix handrails that are broken or loose. Make sure that handrails are as long as the stairways.  Check any carpeting to make sure that it is firmly attached to the stairs. Fix any carpet that is  loose or worn.  Avoid having throw rugs at the top or bottom of the stairs. If you do have throw rugs, attach them to the floor with carpet tape.  Make sure that you have a light switch at the top of the stairs and the bottom of the stairs. If you do not have them, ask someone to add them for you. WHAT ELSE CAN I DO TO HELP PREVENT FALLS?  Wear shoes that:  Do not have high heels.  Have rubber bottoms.  Are comfortable and fit you well.  Are closed at the toe. Do not wear sandals.  If you use a stepladder:  Make sure that it is fully opened. Do not climb a closed stepladder.  Make sure that both sides of the stepladder are locked into place.  Ask someone to hold it for you, if possible.  Clearly mark and make sure that you can see:  Any grab bars or handrails.  First and last steps.  Where the edge of each step  is.  Use tools that help you move around (mobility aids) if they are needed. These include:  Canes.  Walkers.  Scooters.  Crutches.  Turn on the lights when you go into a dark area. Replace any light bulbs as soon as they burn out.  Set up your furniture so you have a clear path. Avoid moving your furniture around.  If any of your floors are uneven, fix them.  If there are any pets around you, be aware of where they are.  Review your medicines with your doctor. Some medicines can make you feel dizzy. This can increase your chance of falling. Ask your doctor what other things that you can do to help prevent falls.   This information is not intended to replace advice given to you by your health care provider. Make sure you discuss any questions you have with your health care provider.   Document Released: 08/17/2009 Document Revised: 03/07/2015 Document Reviewed: 11/25/2014 Elsevier Interactive Patient Education [employer].

## 2015-11-28 ENCOUNTER — Encounter: Payer: Self-pay | Admitting: Internal Medicine

## 2015-11-28 ENCOUNTER — Ambulatory Visit (INDEPENDENT_AMBULATORY_CARE_PROVIDER_SITE_OTHER): Payer: Medicare Other | Admitting: Internal Medicine

## 2015-11-28 VITALS — BP 114/70 | HR 81 | Temp 98.1°F | Resp 12 | Ht 66.5 in | Wt 179.0 lb

## 2015-11-28 DIAGNOSIS — C50919 Malignant neoplasm of unspecified site of unspecified female breast: Secondary | ICD-10-CM | POA: Diagnosis not present

## 2015-11-28 DIAGNOSIS — Z0000 Encounter for general adult medical examination without abnormal findings: Secondary | ICD-10-CM | POA: Diagnosis not present

## 2015-11-28 DIAGNOSIS — E559 Vitamin D deficiency, unspecified: Secondary | ICD-10-CM | POA: Diagnosis not present

## 2015-11-28 LAB — HEPATITIS C ANTIBODY: HCV AB: NEGATIVE

## 2015-11-28 NOTE — Progress Notes (Signed)
Pre-visit discussion using our clinic review tool. No additional management support is needed unless otherwise documented below in the visit note.  

## 2015-11-28 NOTE — Progress Notes (Signed)
  I have reviewed the above information and agree with above.   Shayanne Gomm, MD 

## 2015-11-28 NOTE — Patient Instructions (Signed)

## 2015-11-28 NOTE — Progress Notes (Addendum)
Patient ID: Carol [name], female    DOB: 1941/05/13  Age: 75 y.o. MRN: [id number]  The patient is here for annual wellness examination and management of other chronic and acute problems.  Right sided Breast cancer July 2015 follow up at [facility] i April 2017  Orbital lymphoma follow up annually now with [facility]  No recurrence  Was treated for bronchitis by [facility] in November by [facility] because she could not get through our automated phone service and kept getting passed around  For 45 minutes  Second issue:  Could not get through to change her physical so she had to drive up here  Taking 800 mg mg daily per [facility]  not sure how much D3  Breast exam done.  Right breast partial mastectomy scars,     The risk factors are reflected in the social history.  The roster of all physicians providing medical care to patient - is listed in the Snapshot section of the chart.  Activities of daily living:  The patient is 100% independent in all ADLs: dressing, toileting, feeding as well as independent mobility  Home safety : The patient has smoke detectors in the home. They wear seatbelts.  There are no firearms at home. There is no violence in the home.   There is no risks for hepatitis, STDs or HIV. There is no   history of blood transfusion. They have no travel history to infectious disease endemic areas of the world.  The patient has seen their dentist in the last six month. They have seen their eye doctor in the last year. They admit to slight hearing difficulty with regard to whispered voices and some television programs.  They have deferred audiologic testing in the last year.  They do not  have excessive sun exposure. Discussed the need for sun protection: hats, long sleeves and use of sunscreen if there is significant sun exposure.   Diet: the importance of a healthy diet is discussed. They do have a healthy diet.  The benefits of regular aerobic exercise were discussed. She  walks 4 times per week ,  20 minutes.   Depression screen: there are no signs or vegative symptoms of depression- irritability, change in appetite, anhedonia, sadness/tearfullness.  Cognitive assessment: the patient manages all their financial and personal affairs and is actively engaged. They could relate day,date,year and events; recalled 2/3 objects at 3 minutes; performed clock-face test normally.  The following portions of the patient's history were reviewed and updated as appropriate: allergies, current medications, past family history, past medical history,  past surgical history, past social history  and problem list.  Visual acuity was not assessed per patient preference since she has regular follow up with her ophthalmologist. Hearing and body mass index were assessed and reviewed.   During the course of the visit the patient was educated and counseled about appropriate screening and preventive services including : fall prevention , diabetes screening, nutrition counseling, colorectal cancer screening, and recommended immunizations.    CC: The primary encounter diagnosis was Vitamin D deficiency. Diagnoses of Hypomagnesemia, Malignant neoplasm of female breast, unspecified laterality, unspecified site of breast ([place]), and Encounter for preventive health examination were also pertinent to this visit.  [name] has a past medical history of History of pneumonia (1999); Lichen planus; Irritable bowel syndrome; Chronic sinusitis; Moderate mitral insufficiency (JAN 2012); Mild tricuspid insufficiency (Jan 2012); Hyperlipidemia; Fibrocystic breast disease; and lymphoma (August 2013).   She has past surgical history that includes Maxillary  sinus lift (1990's); oophorectomy; Breast surgery; Abdominal hysterectomy; and Ablation of dysrhythmic focus (August 2013).   Her family history includes Cancer in her maternal grandfather and mother; Heart disease in her father; Hyperlipidemia in her  father; Hypertension in her father; Kidney disease in her father.She reports that she has never smoked. She has never used smokeless tobacco. She reports that she drinks about 2.4 oz of alcohol per week. She reports that she does not use illicit drugs.  Outpatient Prescriptions Prior to Visit  Medication Sig Dispense Refill  . albuterol (PROVENTIL HFA;VENTOLIN HFA) 108 (90 BASE) MCG/ACT inhaler Inhale 2 puffs into the lungs every 6 (six) hours as needed. 18 g 0  . diltiazem (CARDIZEM) 60 MG tablet Take 1 tablet (60 mg total) by mouth as needed. 90 tablet 1  . diphenhydrAMINE (BENADRYL) 25 mg capsule Take 25 mg by mouth every 6 (six) hours as needed.    . dofetilide (TIKOSYN) 250 MCG capsule Take 250 mcg by mouth 2 (two) times daily.    . furosemide (LASIX) 20 MG tablet TAKE ONE TABLET BY MOUTH EVERY DAY 90 tablet 2  . ibuprofen (ADVIL,MOTRIN) 200 MG tablet Take 200 mg by mouth every 6 (six) hours as needed.    . Magnesium 400 MG CAPS Take 1 capsule by mouth 2 (two) times daily. 180 capsule 3  . omeprazole (PRILOSEC) 20 MG capsule TAKE 1 CAPSULE EVERY DAY 90 capsule 2  . tamoxifen (NOLVADEX) 20 MG tablet Take 20 mg by mouth daily.    . Tdap (BOOSTRIX) 5-2.5-18.5 LF-MCG/0.5 injection Inject 0.5 mLs into the muscle once. 0.5 mL 0  . warfarin (COUMADIN) 1 MG tablet Take 1 mg by mouth daily.    Marland Kitchen warfarin (COUMADIN) 6 MG tablet Take 6 mg by mouth daily.    . Bisacodyl (LAXATIVE PO) Take 1 tablet by mouth daily as needed. Reported on 11/28/2015    . metoprolol tartrate (LOPRESSOR) 25 MG tablet Take 25 mg by mouth 2 (two) times daily.     No facility-administered medications prior to visit.    Review of Systems   Patient denies headache, fevers, malaise, unintentional weight loss, skin rash, eye pain, sinus congestion and sinus pain, sore throat, dysphagia,  hemoptysis , cough, dyspnea, wheezing, chest pain, palpitations, orthopnea, edema, abdominal pain, nausea, melena, diarrhea, constipation, flank  pain, dysuria, hematuria, urinary  Frequency, nocturia, numbness, tingling, seizures,  Focal weakness, Loss of consciousness,  Tremor, insomnia, depression, anxiety, and suicidal ideation.      Objective:  BP 114/70 mmHg  Pulse 81  Temp(Src) 98.1 F (36.7 C) (Oral)  Resp 12  Ht 5' 6.5" (1.689 m)  Wt 179 lb (81.194 kg)  BMI 28.46 kg/m2  SpO2 98%  Physical Exam   General appearance: alert, cooperative and appears stated age Head: Normocephalic, without obvious abnormality, atraumatic Eyes: conjunctivae/corneas clear. PERRL, EOM's intact. Fundi benign. Ears: normal TM's and external ear canals both ears Nose: Nares normal. Septum midline. Mucosa normal. No drainage or sinus tenderness. Throat: lips, mucosa, and tongue normal; teeth and gums normal Neck: no adenopathy, no carotid bruit, no JVD, supple, symmetrical, trachea midline and thyroid not enlarged, symmetric, no tenderness/mass/nodules Lungs: clear to auscultation bilaterally Breasts: s/p right lumpectomy  No new masses,  or tenderness Heart: regular rate and rhythm, S1, S2 normal, no murmur, click, rub or gallop Abdomen: soft, non-tender; bowel sounds normal; no masses,  no organomegaly Extremities: extremities normal, atraumatic, no cyanosis or edema Pulses: 2+ and symmetric Skin: Skin color, texture, turgor  normal. No rashes or lesions Neurologic: Alert and oriented X 3, normal strength and tone. Normal symmetric reflexes. Normal coordination and gait.     Assessment & Plan:   Problem List Items Addressed This Visit    Breast cancer ([place])    Found on screening mammogram done in July 2015.  S/p lumpectomy, XRT, now on tamoxifen,  Managed by [facility] oncology Dr. Purcell Mouton.  Breast exam today showed a well healed incision scar in right upper outer quadrant and no masses.         Encounter for preventive health examination    Annual comprehensive preventive exam was done as well as an evaluation and management of chronic  conditions .  During the course of the visit the patient was educated and counseled about appropriate screening and preventive services including :  diabetes screening, lipid analysis with projected  10 year  risk for CAD   nutrition counseling, colorectal cancer screening, and recommended immunizations.  Printed recommendations for health maintenance screenings was given.        Other Visit Diagnoses    Vitamin D deficiency    -  Primary    Relevant Orders    VITAMIN D 25 Hydroxy (Vit-D Deficiency, Fractures) (Completed)    Hypomagnesemia        Relevant Orders    Magnesium (Completed)      A total of 25 minutes of face to face time was spent with patient more than half of which was spent in counselling about the above mentioned conditions  and coordination of care   I have discontinued Ms. Gott's metoprolol tartrate. I am also having her maintain her warfarin, diphenhydrAMINE, Bisacodyl (LAXATIVE PO), ibuprofen, dofetilide, diltiazem, Magnesium, tamoxifen, Tdap, albuterol, furosemide, omeprazole, warfarin, and metoprolol succinate.  Meds ordered this encounter  Medications  . metoprolol succinate (TOPROL-XL) 50 MG 24 hr tablet    Sig: Take 25 mg by mouth daily.    Medications Discontinued During This Encounter  Medication Reason  . metoprolol tartrate (LOPRESSOR) 25 MG tablet Error    Follow-up: Return in about 6 months (around 05/27/2016).   Crecencio Mc, MD

## 2015-11-29 ENCOUNTER — Encounter: Payer: Self-pay | Admitting: *Deleted

## 2015-11-29 LAB — MAGNESIUM: MAGNESIUM: 2.1 mg/dL (ref 1.5–2.5)

## 2015-11-29 LAB — VITAMIN D 25 HYDROXY (VIT D DEFICIENCY, FRACTURES): VITD: 40.65 ng/mL (ref 30.00–100.00)

## 2015-11-30 DIAGNOSIS — Z0000 Encounter for general adult medical examination without abnormal findings: Secondary | ICD-10-CM | POA: Insufficient documentation

## 2015-11-30 NOTE — Assessment & Plan Note (Signed)
Annual comprehensive preventive exam was done as well as an evaluation and management of chronic conditions .  During the course of the visit the patient was educated and counseled about appropriate screening and preventive services including :  diabetes screening, lipid analysis with projected  10 year  risk for CAD  nutrition counseling, colorectal cancer screening, and recommended immunizations.  Printed recommendations for health maintenance screenings was given.  

## 2015-11-30 NOTE — Addendum Note (Signed)
Addended by: Crecencio Mc on: 11/30/2015 05:33 PM   Modules accepted: Miquel Dunn

## 2015-11-30 NOTE — Assessment & Plan Note (Signed)
Found on screening mammogram done in July 2015.  S/p lumpectomy, XRT, now on tamoxifen,  Managed by [facility] oncology Dr. Purcell Mouton.  Breast exam today showed a well healed incision scar in right upper outer quadrant and no masses.

## 2015-12-14 DIAGNOSIS — L905 Scar conditions and fibrosis of skin: Secondary | ICD-10-CM | POA: Diagnosis not present

## 2015-12-14 DIAGNOSIS — C44712 Basal cell carcinoma of skin of right lower limb, including hip: Secondary | ICD-10-CM | POA: Diagnosis not present

## 2015-12-14 DIAGNOSIS — C44519 Basal cell carcinoma of skin of other part of trunk: Secondary | ICD-10-CM | POA: Diagnosis not present

## 2015-12-18 DIAGNOSIS — I482 Chronic atrial fibrillation: Secondary | ICD-10-CM | POA: Diagnosis not present

## 2015-12-19 ENCOUNTER — Other Ambulatory Visit: Payer: Self-pay | Admitting: Internal Medicine

## 2015-12-21 DIAGNOSIS — C44519 Basal cell carcinoma of skin of other part of trunk: Secondary | ICD-10-CM | POA: Diagnosis not present

## 2015-12-21 DIAGNOSIS — C8308 Small cell B-cell lymphoma, lymph nodes of multiple sites: Secondary | ICD-10-CM | POA: Diagnosis not present

## 2015-12-21 DIAGNOSIS — Z853 Personal history of malignant neoplasm of breast: Secondary | ICD-10-CM | POA: Diagnosis not present

## 2015-12-21 DIAGNOSIS — Z9011 Acquired absence of right breast and nipple: Secondary | ICD-10-CM | POA: Diagnosis not present

## 2015-12-21 DIAGNOSIS — M25572 Pain in left ankle and joints of left foot: Secondary | ICD-10-CM | POA: Diagnosis not present

## 2015-12-21 DIAGNOSIS — Z923 Personal history of irradiation: Secondary | ICD-10-CM | POA: Diagnosis not present

## 2015-12-21 DIAGNOSIS — Z7901 Long term (current) use of anticoagulants: Secondary | ICD-10-CM | POA: Diagnosis not present

## 2015-12-21 DIAGNOSIS — C44712 Basal cell carcinoma of skin of right lower limb, including hip: Secondary | ICD-10-CM | POA: Diagnosis not present

## 2015-12-21 DIAGNOSIS — M79671 Pain in right foot: Secondary | ICD-10-CM | POA: Diagnosis not present

## 2015-12-21 DIAGNOSIS — Z08 Encounter for follow-up examination after completed treatment for malignant neoplasm: Secondary | ICD-10-CM | POA: Diagnosis not present

## 2015-12-21 DIAGNOSIS — C50919 Malignant neoplasm of unspecified site of unspecified female breast: Secondary | ICD-10-CM | POA: Diagnosis not present

## 2015-12-21 DIAGNOSIS — Z79810 Long term (current) use of selective estrogen receptor modulators (SERMs): Secondary | ICD-10-CM | POA: Diagnosis not present

## 2015-12-21 DIAGNOSIS — M25571 Pain in right ankle and joints of right foot: Secondary | ICD-10-CM | POA: Diagnosis not present

## 2015-12-21 DIAGNOSIS — M79672 Pain in left foot: Secondary | ICD-10-CM | POA: Diagnosis not present

## 2015-12-21 DIAGNOSIS — J320 Chronic maxillary sinusitis: Secondary | ICD-10-CM | POA: Diagnosis not present

## 2015-12-21 DIAGNOSIS — Z8572 Personal history of non-Hodgkin lymphomas: Secondary | ICD-10-CM | POA: Diagnosis not present

## 2016-01-10 DIAGNOSIS — I482 Chronic atrial fibrillation: Secondary | ICD-10-CM | POA: Diagnosis not present

## 2016-01-24 DIAGNOSIS — I480 Paroxysmal atrial fibrillation: Secondary | ICD-10-CM | POA: Diagnosis not present

## 2016-01-24 DIAGNOSIS — K219 Gastro-esophageal reflux disease without esophagitis: Secondary | ICD-10-CM | POA: Diagnosis not present

## 2016-01-24 DIAGNOSIS — I341 Nonrheumatic mitral (valve) prolapse: Secondary | ICD-10-CM | POA: Diagnosis not present

## 2016-01-24 DIAGNOSIS — I5042 Chronic combined systolic (congestive) and diastolic (congestive) heart failure: Secondary | ICD-10-CM | POA: Diagnosis not present

## 2016-01-24 DIAGNOSIS — E782 Mixed hyperlipidemia: Secondary | ICD-10-CM | POA: Diagnosis not present

## 2016-02-08 DIAGNOSIS — I482 Chronic atrial fibrillation: Secondary | ICD-10-CM | POA: Diagnosis not present

## 2016-02-27 DIAGNOSIS — Z853 Personal history of malignant neoplasm of breast: Secondary | ICD-10-CM | POA: Diagnosis not present

## 2016-02-27 DIAGNOSIS — R922 Inconclusive mammogram: Secondary | ICD-10-CM | POA: Diagnosis not present

## 2016-02-27 DIAGNOSIS — Z9889 Other specified postprocedural states: Secondary | ICD-10-CM | POA: Diagnosis not present

## 2016-02-27 DIAGNOSIS — C50911 Malignant neoplasm of unspecified site of right female breast: Secondary | ICD-10-CM | POA: Diagnosis not present

## 2016-02-27 DIAGNOSIS — R928 Other abnormal and inconclusive findings on diagnostic imaging of breast: Secondary | ICD-10-CM | POA: Diagnosis not present

## 2016-03-07 DIAGNOSIS — I482 Chronic atrial fibrillation: Secondary | ICD-10-CM | POA: Diagnosis not present

## 2016-03-14 DIAGNOSIS — D2271 Melanocytic nevi of right lower limb, including hip: Secondary | ICD-10-CM | POA: Diagnosis not present

## 2016-03-14 DIAGNOSIS — Z85828 Personal history of other malignant neoplasm of skin: Secondary | ICD-10-CM | POA: Diagnosis not present

## 2016-03-14 DIAGNOSIS — L821 Other seborrheic keratosis: Secondary | ICD-10-CM | POA: Diagnosis not present

## 2016-03-14 DIAGNOSIS — D225 Melanocytic nevi of trunk: Secondary | ICD-10-CM | POA: Diagnosis not present

## 2016-04-04 DIAGNOSIS — I480 Paroxysmal atrial fibrillation: Secondary | ICD-10-CM | POA: Diagnosis not present

## 2016-04-17 DIAGNOSIS — K219 Gastro-esophageal reflux disease without esophagitis: Secondary | ICD-10-CM | POA: Diagnosis not present

## 2016-04-17 DIAGNOSIS — E669 Obesity, unspecified: Secondary | ICD-10-CM | POA: Diagnosis not present

## 2016-04-17 DIAGNOSIS — Z5181 Encounter for therapeutic drug level monitoring: Secondary | ICD-10-CM | POA: Diagnosis not present

## 2016-04-17 DIAGNOSIS — E782 Mixed hyperlipidemia: Secondary | ICD-10-CM | POA: Diagnosis not present

## 2016-04-17 DIAGNOSIS — I5042 Chronic combined systolic (congestive) and diastolic (congestive) heart failure: Secondary | ICD-10-CM | POA: Diagnosis not present

## 2016-04-17 DIAGNOSIS — I480 Paroxysmal atrial fibrillation: Secondary | ICD-10-CM | POA: Diagnosis not present

## 2016-04-17 DIAGNOSIS — Z79899 Other long term (current) drug therapy: Secondary | ICD-10-CM | POA: Diagnosis not present

## 2016-04-17 DIAGNOSIS — I341 Nonrheumatic mitral (valve) prolapse: Secondary | ICD-10-CM | POA: Diagnosis not present

## 2016-05-02 DIAGNOSIS — I480 Paroxysmal atrial fibrillation: Secondary | ICD-10-CM | POA: Diagnosis not present

## 2016-05-27 ENCOUNTER — Ambulatory Visit (INDEPENDENT_AMBULATORY_CARE_PROVIDER_SITE_OTHER): Payer: Medicare Other | Admitting: Internal Medicine

## 2016-05-27 ENCOUNTER — Encounter: Payer: Self-pay | Admitting: Internal Medicine

## 2016-05-27 VITALS — BP 110/70 | HR 68 | Temp 97.9°F | Resp 16 | Wt 179.0 lb

## 2016-05-27 DIAGNOSIS — E559 Vitamin D deficiency, unspecified: Secondary | ICD-10-CM

## 2016-05-27 DIAGNOSIS — R739 Hyperglycemia, unspecified: Secondary | ICD-10-CM | POA: Diagnosis not present

## 2016-05-27 DIAGNOSIS — R002 Palpitations: Secondary | ICD-10-CM

## 2016-05-27 DIAGNOSIS — E871 Hypo-osmolality and hyponatremia: Secondary | ICD-10-CM

## 2016-05-27 DIAGNOSIS — E785 Hyperlipidemia, unspecified: Secondary | ICD-10-CM | POA: Diagnosis not present

## 2016-05-27 DIAGNOSIS — Z7901 Long term (current) use of anticoagulants: Secondary | ICD-10-CM

## 2016-05-27 DIAGNOSIS — Z79810 Long term (current) use of selective estrogen receptor modulators (SERMs): Secondary | ICD-10-CM

## 2016-05-27 LAB — COMPLETE METABOLIC PANEL WITH GFR
ALT: 25 U/L (ref 6–29)
AST: 23 U/L (ref 10–35)
Albumin: 4.1 g/dL (ref 3.6–5.1)
Alkaline Phosphatase: 41 U/L (ref 33–130)
BUN: 15 mg/dL (ref 7–25)
CHLORIDE: 105 mmol/L (ref 98–110)
CO2: 26 mmol/L (ref 20–31)
CREATININE: 0.86 mg/dL (ref 0.60–0.93)
Calcium: 9.5 mg/dL (ref 8.6–10.4)
GFR, Est African American: 76 mL/min (ref >=60)
GFR, Est Non African American: 66 mL/min (ref >=60)
Glucose, Bld: 103 mg/dL — ABNORMAL HIGH (ref 65–99)
Potassium: 5 mmol/L (ref 3.5–5.3)
Sodium: 141 mmol/L (ref 135–146)
Total Bilirubin: 0.6 mg/dL (ref 0.2–1.2)
Total Protein: 6.2 g/dL (ref 6.1–8.1)

## 2016-05-27 LAB — CBC WITH DIFFERENTIAL/PLATELET
BASOS PCT: 0.3 % (ref 0.0–3.0)
Basophils Absolute: 0 10*3/uL (ref 0.0–0.1)
EOS ABS: 0.1 10*3/uL (ref 0.0–0.7)
Eosinophils Relative: 1.9 % (ref 0.0–5.0)
HCT: 41.6 % (ref 36.0–46.0)
HEMOGLOBIN: 14.1 g/dL (ref 12.0–15.0)
LYMPHS ABS: 1.7 10*3/uL (ref 0.7–4.0)
Lymphocytes Relative: 35.3 % (ref 12.0–46.0)
MCHC: 33.8 g/dL (ref 30.0–36.0)
MCV: 98 fl (ref 78.0–100.0)
MONO ABS: 0.4 10*3/uL (ref 0.1–1.0)
Monocytes Relative: 8.7 % (ref 3.0–12.0)
NEUTROS PCT: 53.8 % (ref 43.0–77.0)
Neutro Abs: 2.6 10*3/uL (ref 1.4–7.7)
Platelets: 261 10*3/uL (ref 150.0–400.0)
RBC: 4.24 Mil/uL (ref 3.87–5.11)
RDW: 13.1 % (ref 11.5–15.5)
WBC: 4.8 10*3/uL (ref 4.0–10.5)

## 2016-05-27 LAB — LIPID PANEL
CHOL/HDL RATIO: 4
Cholesterol: 216 mg/dL — ABNORMAL HIGH (ref 0–200)
HDL: 55.3 mg/dL (ref >39.00)
LDL Cholesterol: 123 mg/dL — ABNORMAL HIGH (ref 0–99)
NONHDL: 161.05
Triglycerides: 188 mg/dL — ABNORMAL HIGH (ref 0.0–149.0)
VLDL: 37.6 mg/dL (ref 0.0–40.0)

## 2016-05-27 LAB — VITAMIN D 25 HYDROXY (VIT D DEFICIENCY, FRACTURES): VITD: 58.82 ng/mL (ref 30.00–100.00)

## 2016-05-27 LAB — TSH: TSH: 1.87 u[IU]/mL (ref 0.35–4.50)

## 2016-05-27 LAB — MAGNESIUM: MAGNESIUM: 2.2 mg/dL (ref 1.5–2.5)

## 2016-05-27 LAB — HEMOGLOBIN A1C: HEMOGLOBIN A1C: 5.7 % (ref 4.6–6.5)

## 2016-05-27 NOTE — Patient Instructions (Addendum)
Good to see you!!   I AM CHECKING THYROID,,  CHOLESTEROL   ELECTROLYTES AND SCREENING YOU FOR DIABETES TODAY   f you crave icream, Try the Halo Top ice creams.  They are higher in protein  And lower in carbs/sugar  Try using turmeric for joint pain. It is available in capsule form,  Or you can add as a spice to smoothies

## 2016-05-27 NOTE — Progress Notes (Addendum)
Subjective:  Patient ID: Carol [name], female    DOB: 1941-04-24  Age: 75 y.o. MRN: [id number]  CC: The primary encounter diagnosis was Vitamin D deficiency. Diagnoses of Palpitations, Hyponatremia, Hyperlipidemia, Hyperglycemia, Encounter for current long-term use of anticoagulants, and Use of tamoxifen (Nolvadex) were also pertinent to this visit.  HPI Kimiyah Shedd presents for 6 month follow up on CHRONIC CONDITIONS  Breast cancer , lymphoma both .managed at [facility]. Mammogram normal April.   No recurrence of either by Feb 2017 office visit (reviewed today) Taking On tamoxifen but suffering from side effects  .  Lots of tendon pain,  Vaginal dryness and hot flashes PET scan annually at [facility] in August follow up with [place] afterward     Atrial fib:  managed by [facility]ed with cardizem, metoprolol  and tikosyn . embolic stroke risk mitigated with warfarin.  No bleeding episodes or excessive bleeding.  Palpitations ave been more frequent but self limiting.  Avoids caffeine.   Last DEXA 5 yrs ago at [facility].    Had some ankle edema while at the beach,  Did not respond to extra 1/2 tablet lasix.  Has varicose veins. Resolved when she came back home     No recent use of inhaler.   sleeping well .  BP fine not worrying , no constipation   Weight discussed.  Diet reviewed:  Avoids snacking ,  Junk food.  bkfast is yogurt and fruit,  Dinner meat and a vegetable.     Outpatient Medications Prior to Visit  Medication Sig Dispense Refill  . albuterol (PROVENTIL HFA;VENTOLIN HFA) 108 (90 BASE) MCG/ACT inhaler Inhale 2 puffs into the lungs every 6 (six) hours as needed. 18 g 0  . Bisacodyl (LAXATIVE PO) Take 1 tablet by mouth daily as needed. Reported on 11/28/2015    . diltiazem (CARDIZEM) 60 MG tablet Take 1 tablet (60 mg total) by mouth as needed. 90 tablet 1  . diphenhydrAMINE (BENADRYL) 25 mg capsule Take 25 mg by mouth every 6 (six) hours as needed.    . dofetilide  (TIKOSYN) 250 MCG capsule Take 250 mcg by mouth 2 (two) times daily.    . furosemide (LASIX) 20 MG tablet TAKE 1 TABLET EVERY DAY 90 tablet 2  . ibuprofen (ADVIL,MOTRIN) 200 MG tablet Take 200 mg by mouth every 6 (six) hours as needed.    . Magnesium 400 MG CAPS Take 1 capsule by mouth 2 (two) times daily. 180 capsule 3  . metoprolol succinate (TOPROL-XL) 50 MG 24 hr tablet Take 25 mg by mouth daily.    Marland Kitchen omeprazole (PRILOSEC) 20 MG capsule TAKE 1 CAPSULE EVERY DAY 90 capsule 2  . tamoxifen (NOLVADEX) 20 MG tablet Take 20 mg by mouth daily.    Marland Kitchen warfarin (COUMADIN) 1 MG tablet Take 1 mg by mouth daily.    Marland Kitchen warfarin (COUMADIN) 6 MG tablet Take 6 mg by mouth daily.    . Tdap (BOOSTRIX) 5-2.5-18.5 LF-MCG/0.5 injection Inject 0.5 mLs into the muscle once. (Patient not taking: Reported on 05/27/2016) 0.5 mL 0   No facility-administered medications prior to visit.     Review of Systems;  Patient denies headache, fevers, malaise, unintentional weight loss, skin rash, eye pain, sinus congestion and sinus pain, sore throat, dysphagia,  hemoptysis , cough, dyspnea, wheezing, chest pain,, orthopnea, , abdominal pain, nausea, melena, diarrhea, constipation, flank pain, dysuria, hematuria, urinary  Frequency, nocturia, numbness, tingling, seizures,  Focal weakness, Loss of consciousness,  Tremor, insomnia, depression, anxiety,  and suicidal ideation.      Objective:  BP 110/70 (BP Location: Left Arm, Patient Position: Sitting, Cuff Size: Normal)   Pulse 68 Comment: irregular  Temp 97.9 F (36.6 C) (Oral)   Resp 16   Wt 179 lb (81.2 kg)   BMI 28.46 kg/m   BP Readings from Last 3 Encounters:  05/27/16 110/70  11/28/15 114/70  11/27/15 116/72    Wt Readings from Last 3 Encounters:  05/27/16 179 lb (81.2 kg)  11/28/15 179 lb (81.2 kg)  11/27/15 176 lb 12.8 oz (80.2 kg)    General appearance: alert, cooperative and appears stated age Ears: normal TM's and external ear canals both  ears Throat: lips, mucosa, and tongue normal; teeth and gums normal Neck: no adenopathy, no carotid bruit, supple, symmetrical, trachea midline and thyroid not enlarged, symmetric, no tenderness/mass/nodules Back: symmetric, no curvature. ROM normal. No CVA tenderness. Lungs: clear to auscultation bilaterally Heart: regular rate and rhythm, S1, S2 normal, no murmur, click, rub or gallop Abdomen: soft, non-tender; bowel sounds normal; no masses,  no organomegaly Pulses: 2+ and symmetric Skin: Skin color, texture, turgor normal. No rashes or lesions Lymph nodes: Cervical, supraclavicular, and axillary nodes normal.  Lab Results  Component Value Date   HGBA1C 5.7 05/27/2016    Lab Results  Component Value Date   CREATININE 0.86 05/27/2016   CREATININE 0.82 11/27/2015   CREATININE 0.7 11/07/2014    Lab Results  Component Value Date   WBC 4.8 05/27/2016   HGB 14.1 05/27/2016   HCT 41.6 05/27/2016   PLT 261.0 05/27/2016   GLUCOSE 103 (H) 05/27/2016   CHOL 216 (H) 05/27/2016   TRIG 188.0 (H) 05/27/2016   HDL 55.30 05/27/2016   LDLDIRECT 130.8 11/02/2013   LDLCALC 123 (H) 05/27/2016   ALT 25 05/27/2016   AST 23 05/27/2016   NA 141 05/27/2016   K 5.0 05/27/2016   CL 105 05/27/2016   CREATININE 0.86 05/27/2016   BUN 15 05/27/2016   CO2 26 05/27/2016   TSH 1.87 05/27/2016   INR 2.62 (H) 02/12/2012   HGBA1C 5.7 05/27/2016    No results found.  Assessment & Plan:   Problem List Items Addressed This Visit    Hyperlipidemia    New ACC guidelines recommend starting patients aged 38 or higher on moderate intensity statin therapy for LDL between 70-189 and 10 yr risk of CAD > 7.5% ;  and high intensity therapy for anyone with LDL > 190.   Her calculated risk is between 10 to 20% ,  Statin therapy was offered but but not strongly advised at this point. Will repeat in 6 months   Lab Results  Component Value Date   CHOL 216 (H) 05/27/2016   HDL 55.30 05/27/2016   LDLCALC 123 (H)  05/27/2016   LDLDIRECT 130.8 11/02/2013   TRIG 188.0 (H) 05/27/2016   CHOLHDL 4 05/27/2016           Relevant Orders   Lipid panel (Completed)    Other Visit Diagnoses    Vitamin D deficiency    -  Primary   Relevant Orders   VITAMIN D 25 Hydroxy (Vit-D Deficiency, Fractures) (Completed)   Palpitations       Relevant Orders   Magnesium (Completed)   TSH (Completed)   Hyponatremia       Relevant Orders   COMPLETE METABOLIC PANEL WITH GFR (Completed)   Hyperglycemia       Relevant Orders   Hemoglobin A1c (Completed)  Encounter for current long-term use of anticoagulants       Relevant Orders   CBC with Differential/Platelet (Completed)   Use of tamoxifen (Nolvadex)       Relevant Orders   DG Bone Density      I am having Ms. Grandville Silos maintain her warfarin, diphenhydrAMINE, Bisacodyl (LAXATIVE PO), ibuprofen, dofetilide, diltiazem, Magnesium, tamoxifen, Tdap, albuterol, omeprazole, warfarin, metoprolol succinate, and furosemide.  No orders of the defined types were placed in this encounter.   There are no discontinued medications.  Follow-up: No Follow-up on file.   Crecencio Mc, MD

## 2016-05-29 DIAGNOSIS — I480 Paroxysmal atrial fibrillation: Secondary | ICD-10-CM | POA: Diagnosis not present

## 2016-05-29 NOTE — Assessment & Plan Note (Signed)
New ACC guidelines recommend starting patients aged 76 or higher on moderate intensity statin therapy for LDL between 70-189 and 10 yr risk of CAD > 7.5% ;  and high intensity therapy for anyone with LDL > 190.   Her calculated risk is between 10 to 20% ,  Statin therapy was offered but but not strongly advised at this point. Will repeat in 6 months   Lab Results  Component Value Date   CHOL 216 (H) 05/27/2016   HDL 55.30 05/27/2016   LDLCALC 123 (H) 05/27/2016   LDLDIRECT 130.8 11/02/2013   TRIG 188.0 (H) 05/27/2016   CHOLHDL 4 05/27/2016

## 2016-06-07 DIAGNOSIS — Z853 Personal history of malignant neoplasm of breast: Secondary | ICD-10-CM | POA: Diagnosis not present

## 2016-06-07 DIAGNOSIS — Z9221 Personal history of antineoplastic chemotherapy: Secondary | ICD-10-CM | POA: Diagnosis not present

## 2016-06-07 DIAGNOSIS — C50911 Malignant neoplasm of unspecified site of right female breast: Secondary | ICD-10-CM | POA: Diagnosis not present

## 2016-06-07 DIAGNOSIS — Z7901 Long term (current) use of anticoagulants: Secondary | ICD-10-CM | POA: Diagnosis not present

## 2016-06-07 DIAGNOSIS — C50411 Malignant neoplasm of upper-outer quadrant of right female breast: Secondary | ICD-10-CM | POA: Diagnosis not present

## 2016-06-07 DIAGNOSIS — Z79810 Long term (current) use of selective estrogen receptor modulators (SERMs): Secondary | ICD-10-CM | POA: Diagnosis not present

## 2016-06-07 DIAGNOSIS — Z923 Personal history of irradiation: Secondary | ICD-10-CM | POA: Diagnosis not present

## 2016-06-07 DIAGNOSIS — M2550 Pain in unspecified joint: Secondary | ICD-10-CM | POA: Diagnosis not present

## 2016-06-07 DIAGNOSIS — Z9889 Other specified postprocedural states: Secondary | ICD-10-CM | POA: Diagnosis not present

## 2016-06-07 DIAGNOSIS — C50919 Malignant neoplasm of unspecified site of unspecified female breast: Secondary | ICD-10-CM | POA: Diagnosis not present

## 2016-06-07 DIAGNOSIS — Z9225 Personal history of immunosupression therapy: Secondary | ICD-10-CM | POA: Diagnosis not present

## 2016-06-07 DIAGNOSIS — J329 Chronic sinusitis, unspecified: Secondary | ICD-10-CM | POA: Diagnosis not present

## 2016-06-07 DIAGNOSIS — C8319 Mantle cell lymphoma, extranodal and solid organ sites: Secondary | ICD-10-CM | POA: Diagnosis not present

## 2016-06-07 DIAGNOSIS — I482 Chronic atrial fibrillation: Secondary | ICD-10-CM | POA: Diagnosis not present

## 2016-06-07 DIAGNOSIS — C8301 Small cell B-cell lymphoma, lymph nodes of head, face, and neck: Secondary | ICD-10-CM | POA: Diagnosis not present

## 2016-06-07 DIAGNOSIS — C7989 Secondary malignant neoplasm of other specified sites: Secondary | ICD-10-CM | POA: Diagnosis not present

## 2016-06-07 DIAGNOSIS — N951 Menopausal and female climacteric states: Secondary | ICD-10-CM | POA: Diagnosis not present

## 2016-06-07 DIAGNOSIS — C44702 Unspecified malignant neoplasm of skin of right lower limb, including hip: Secondary | ICD-10-CM | POA: Diagnosis not present

## 2016-06-07 DIAGNOSIS — Z79899 Other long term (current) drug therapy: Secondary | ICD-10-CM | POA: Diagnosis not present

## 2016-06-25 DIAGNOSIS — I480 Paroxysmal atrial fibrillation: Secondary | ICD-10-CM | POA: Diagnosis not present

## 2016-06-26 ENCOUNTER — Other Ambulatory Visit: Payer: Self-pay | Admitting: Internal Medicine

## 2016-07-03 DIAGNOSIS — C8589 Other specified types of non-Hodgkin lymphoma, extranodal and solid organ sites: Secondary | ICD-10-CM | POA: Diagnosis not present

## 2016-07-18 DIAGNOSIS — Z79899 Other long term (current) drug therapy: Secondary | ICD-10-CM | POA: Diagnosis not present

## 2016-07-18 DIAGNOSIS — E782 Mixed hyperlipidemia: Secondary | ICD-10-CM | POA: Diagnosis not present

## 2016-07-18 DIAGNOSIS — I480 Paroxysmal atrial fibrillation: Secondary | ICD-10-CM | POA: Diagnosis not present

## 2016-07-18 DIAGNOSIS — K219 Gastro-esophageal reflux disease without esophagitis: Secondary | ICD-10-CM | POA: Diagnosis not present

## 2016-07-18 DIAGNOSIS — I341 Nonrheumatic mitral (valve) prolapse: Secondary | ICD-10-CM | POA: Diagnosis not present

## 2016-07-18 DIAGNOSIS — Z5181 Encounter for therapeutic drug level monitoring: Secondary | ICD-10-CM | POA: Diagnosis not present

## 2016-07-23 DIAGNOSIS — I480 Paroxysmal atrial fibrillation: Secondary | ICD-10-CM | POA: Diagnosis not present

## 2016-07-24 ENCOUNTER — Other Ambulatory Visit: Payer: Self-pay | Admitting: Internal Medicine

## 2016-07-26 DIAGNOSIS — L309 Dermatitis, unspecified: Secondary | ICD-10-CM | POA: Diagnosis not present

## 2016-08-19 DIAGNOSIS — Z170 Estrogen receptor positive status [ER+]: Secondary | ICD-10-CM | POA: Diagnosis not present

## 2016-08-19 DIAGNOSIS — C50911 Malignant neoplasm of unspecified site of right female breast: Secondary | ICD-10-CM | POA: Diagnosis not present

## 2016-08-21 DIAGNOSIS — I480 Paroxysmal atrial fibrillation: Secondary | ICD-10-CM | POA: Diagnosis not present

## 2016-09-03 DIAGNOSIS — L728 Other follicular cysts of the skin and subcutaneous tissue: Secondary | ICD-10-CM | POA: Diagnosis not present

## 2016-09-05 DIAGNOSIS — I480 Paroxysmal atrial fibrillation: Secondary | ICD-10-CM | POA: Diagnosis not present

## 2016-09-30 DIAGNOSIS — Z23 Encounter for immunization: Secondary | ICD-10-CM | POA: Diagnosis not present

## 2016-10-01 DIAGNOSIS — I480 Paroxysmal atrial fibrillation: Secondary | ICD-10-CM | POA: Diagnosis not present

## 2016-10-17 DIAGNOSIS — I493 Ventricular premature depolarization: Secondary | ICD-10-CM | POA: Diagnosis not present

## 2016-10-17 DIAGNOSIS — Z5181 Encounter for therapeutic drug level monitoring: Secondary | ICD-10-CM | POA: Diagnosis not present

## 2016-10-17 DIAGNOSIS — Z79899 Other long term (current) drug therapy: Secondary | ICD-10-CM | POA: Diagnosis not present

## 2016-10-17 DIAGNOSIS — I480 Paroxysmal atrial fibrillation: Secondary | ICD-10-CM | POA: Diagnosis not present

## 2016-10-23 DIAGNOSIS — L309 Dermatitis, unspecified: Secondary | ICD-10-CM | POA: Diagnosis not present

## 2016-10-23 DIAGNOSIS — C44722 Squamous cell carcinoma of skin of right lower limb, including hip: Secondary | ICD-10-CM | POA: Diagnosis not present

## 2016-11-05 DIAGNOSIS — I480 Paroxysmal atrial fibrillation: Secondary | ICD-10-CM | POA: Diagnosis not present

## 2016-11-13 DIAGNOSIS — L905 Scar conditions and fibrosis of skin: Secondary | ICD-10-CM | POA: Diagnosis not present

## 2016-11-13 DIAGNOSIS — C44722 Squamous cell carcinoma of skin of right lower limb, including hip: Secondary | ICD-10-CM | POA: Diagnosis not present

## 2016-11-21 DIAGNOSIS — T814XXA Infection following a procedure, initial encounter: Secondary | ICD-10-CM | POA: Diagnosis not present

## 2016-11-26 DIAGNOSIS — I480 Paroxysmal atrial fibrillation: Secondary | ICD-10-CM | POA: Diagnosis not present

## 2016-11-27 ENCOUNTER — Ambulatory Visit (INDEPENDENT_AMBULATORY_CARE_PROVIDER_SITE_OTHER): Payer: Medicare Other | Admitting: Internal Medicine

## 2016-11-27 ENCOUNTER — Encounter: Payer: Self-pay | Admitting: Internal Medicine

## 2016-11-27 VITALS — BP 122/84 | HR 77 | Temp 97.8°F | Resp 16 | Ht 67.0 in | Wt 176.4 lb

## 2016-11-27 DIAGNOSIS — Z7901 Long term (current) use of anticoagulants: Secondary | ICD-10-CM

## 2016-11-27 DIAGNOSIS — E781 Pure hyperglyceridemia: Secondary | ICD-10-CM

## 2016-11-27 DIAGNOSIS — S91001A Unspecified open wound, right ankle, initial encounter: Secondary | ICD-10-CM

## 2016-11-27 DIAGNOSIS — S81001A Unspecified open wound, right knee, initial encounter: Secondary | ICD-10-CM

## 2016-11-27 DIAGNOSIS — S81801A Unspecified open wound, right lower leg, initial encounter: Secondary | ICD-10-CM

## 2016-11-27 DIAGNOSIS — Z481 Encounter for planned postprocedural wound closure: Secondary | ICD-10-CM | POA: Diagnosis not present

## 2016-11-27 LAB — COMPREHENSIVE METABOLIC PANEL
ALBUMIN: 4.3 g/dL (ref 3.5–5.2)
ALK PHOS: 40 U/L (ref 39–117)
ALT: 28 U/L (ref 0–35)
AST: 27 U/L (ref 0–37)
BILIRUBIN TOTAL: 0.7 mg/dL (ref 0.2–1.2)
BUN: 17 mg/dL (ref 6–23)
CALCIUM: 10 mg/dL (ref 8.4–10.5)
CO2: 30 mEq/L (ref 19–32)
Chloride: 103 mEq/L (ref 96–112)
Creatinine, Ser: 0.88 mg/dL (ref 0.40–1.20)
GFR: 66.36 mL/min (ref >60.00)
GLUCOSE: 109 mg/dL — AB (ref 70–99)
POTASSIUM: 4.7 meq/L (ref 3.5–5.1)
Sodium: 141 mEq/L (ref 135–145)
TOTAL PROTEIN: 6.8 g/dL (ref 6.0–8.3)

## 2016-11-27 LAB — CBC WITH DIFFERENTIAL/PLATELET
BASOS ABS: 0 10*3/uL (ref 0.0–0.1)
Basophils Relative: 0.4 % (ref 0.0–3.0)
EOS PCT: 1.2 % (ref 0.0–5.0)
Eosinophils Absolute: 0.1 10*3/uL (ref 0.0–0.7)
HEMATOCRIT: 41.3 % (ref 36.0–46.0)
Hemoglobin: 14.2 g/dL (ref 12.0–15.0)
LYMPHS ABS: 1.7 10*3/uL (ref 0.7–4.0)
LYMPHS PCT: 26.5 % (ref 12.0–46.0)
MCHC: 34.4 g/dL (ref 30.0–36.0)
MCV: 97.3 fl (ref 78.0–100.0)
MONOS PCT: 8.9 % (ref 3.0–12.0)
Monocytes Absolute: 0.6 10*3/uL (ref 0.1–1.0)
NEUTROS ABS: 3.9 10*3/uL (ref 1.4–7.7)
Neutrophils Relative %: 63 % (ref 43.0–77.0)
PLATELETS: 296 10*3/uL (ref 150.0–400.0)
RBC: 4.24 Mil/uL (ref 3.87–5.11)
RDW: 12.9 % (ref 11.5–15.5)
WBC: 6.3 10*3/uL (ref 4.0–10.5)

## 2016-11-27 LAB — LIPID PANEL
CHOL/HDL RATIO: 3
CHOLESTEROL: 219 mg/dL — AB (ref 0–200)
HDL: 66.7 mg/dL (ref >39.00)
LDL Cholesterol: 125 mg/dL — ABNORMAL HIGH (ref 0–99)
NonHDL: 152.58
TRIGLYCERIDES: 136 mg/dL (ref 0.0–149.0)
VLDL: 27.2 mg/dL (ref 0.0–40.0)

## 2016-11-27 LAB — LDL CHOLESTEROL, DIRECT: LDL DIRECT: 126 mg/dL

## 2016-11-27 NOTE — Progress Notes (Signed)
Pre-visit discussion using our clinic review tool. No additional management support is needed unless otherwise documented below in the visit note.  

## 2016-11-27 NOTE — Progress Notes (Signed)
RIGHT CALF DEC 2016 Subjective:  Patient ID: Carol [name], female    DOB: 1940-11-12  Age: 76 y.o. MRN: [id number]  CC: There were no encounter diagnoses.  HPI Carol [name] presents for FOLLOW UP ON ATRIAL FIB, HYPERLIPIDEMIA  Nonhealing wound to right calf:  She reports a history of a  BASAL CELL CA REMOVED FROM RIGHT CALF  In [place] 2016  That failed to resolve for months due to a retained stitch that caused a persistnet INFLAMMATORY REACTION. The stitch was finally removed months later and the wound resolved. Several weeks  ago the wound festered again.  She  Received a STEROID INJECTION TO CALM DOWN THE SWELLING .  BIOPSY REPEATED,  NOW SQUAMOUS CELL . Had excisional surgery  2 WEEKS AGO .   HAs BEEN TAKING DOXYCYCLINE FOR the last 2 weeks due to persistent redness around the incidion, along with a daily probioticRE LAT 14 DAYS AND A PROBIOTIC    Venous stasis history Discussed referral to wound clinic   Lab Results  Component Value Date   HGBA1C 5.7 05/27/2016     Outpatient Medications Prior to Visit  Medication Sig Dispense Refill  . albuterol (PROVENTIL HFA;VENTOLIN HFA) 108 (90 BASE) MCG/ACT inhaler Inhale 2 puffs into the lungs every 6 (six) hours as needed. 18 g 0  . Bisacodyl (LAXATIVE PO) Take 1 tablet by mouth daily as needed. Reported on 11/28/2015    . diltiazem (CARDIZEM) 60 MG tablet Take 1 tablet (60 mg total) by mouth as needed. 90 tablet 1  . diphenhydrAMINE (BENADRYL) 25 mg capsule Take 25 mg by mouth every 6 (six) hours as needed.    . dofetilide (TIKOSYN) 250 MCG capsule Take 250 mcg by mouth 2 (two) times daily.    . furosemide (LASIX) 20 MG tablet TAKE 1 TABLET EVERY DAY 90 tablet 2  . ibuprofen (ADVIL,MOTRIN) 200 MG tablet Take 200 mg by mouth every 6 (six) hours as needed.    . Magnesium 400 MG CAPS Take 1 capsule by mouth 2 (two) times daily. 180 capsule 3  . metoprolol succinate (TOPROL-XL) 50 MG 24 hr tablet Take 25 mg by mouth daily.     . tamoxifen (NOLVADEX) 20 MG tablet Take 20 mg by mouth daily.    . Tdap (BOOSTRIX) 5-2.5-18.5 LF-MCG/0.5 injection Inject 0.5 mLs into the muscle once. (Patient not taking: Reported on 05/27/2016) 0.5 mL 0  . warfarin (COUMADIN) 1 MG tablet Take 1 mg by mouth daily.    Marland Kitchen warfarin (COUMADIN) 6 MG tablet Take 6 mg by mouth daily.    Marland Kitchen omeprazole (PRILOSEC) 20 MG capsule TAKE 1 CAPSULE EVERY DAY 90 capsule 2   No facility-administered medications prior to visit.     Review of Systems;  Patient denies headache, fevers, malaise, unintentional weight loss, skin rash, eye pain, sinus congestion and sinus pain, sore throat, dysphagia,  hemoptysis , cough, dyspnea, wheezing, chest pain, palpitations, orthopnea, edema, abdominal pain, nausea, melena, diarrhea, constipation, flank pain, dysuria, hematuria, urinary  Frequency, nocturia, numbness, tingling, seizures,  Focal weakness, Loss of consciousness,  Tremor, insomnia, depression, anxiety, and suicidal ideation.      Objective:  BP 122/84   Pulse 77   Temp 97.8 F (36.6 C) (Oral)   Resp 16   Ht 5\' 7"  (1.702 m)   Wt 176 lb 6 oz (80 kg)   SpO2 98%   BMI 27.62 kg/m   BP Readings from Last 3 Encounters:  11/27/16 122/84  05/27/16 110/70  11/28/15 114/70    Wt Readings from Last 3 Encounters:  11/27/16 176 lb 6 oz (80 kg)  05/27/16 179 lb (81.2 kg)  11/28/15 179 lb (81.2 kg)    General appearance: alert, cooperative and appears stated age Ears: normal TM's and external ear canals both ears Throat: lips, mucosa, and tongue normal; teeth and gums normal Neck: no adenopathy, no carotid bruit, supple, symmetrical, trachea midline and thyroid not enlarged, symmetric, no tenderness/mass/nodules Back: symmetric, no curvature. ROM normal. No CVA tenderness. Lungs: clear to auscultation bilaterally Heart: regular rate and rhythm, S1, S2 normal, no murmur, click, rub or gallop Abdomen: soft, non-tender; bowel sounds normal; no masses,  no  organomegaly Pulses: 2+ and symmetric Skin: right calf wound is 3 cm long,  slough filled, edges look a bit necrotic,  Some erythema surrounding wound.  Lymph nodes: Cervical, supraclavicular, and axillary nodes normal.  Lab Results  Component Value Date   HGBA1C 5.7 05/27/2016    Lab Results  Component Value Date   CREATININE 0.86 05/27/2016   CREATININE 0.82 11/27/2015   CREATININE 0.7 11/07/2014    Lab Results  Component Value Date   WBC 4.8 05/27/2016   HGB 14.1 05/27/2016   HCT 41.6 05/27/2016   PLT 261.0 05/27/2016   GLUCOSE 103 (H) 05/27/2016   CHOL 216 (H) 05/27/2016   TRIG 188.0 (H) 05/27/2016   HDL 55.30 05/27/2016   LDLDIRECT 130.8 11/02/2013   LDLCALC 123 (H) 05/27/2016   ALT 25 05/27/2016   AST 23 05/27/2016   NA 141 05/27/2016   K 5.0 05/27/2016   CL 105 05/27/2016   CREATININE 0.86 05/27/2016   BUN 15 05/27/2016   CO2 26 05/27/2016   TSH 1.87 05/27/2016   INR 2.62 (H) 02/12/2012   HGBA1C 5.7 05/27/2016    No results found.  Assessment & Plan:   Problem List Items Addressed This Visit    None      I have discontinued [name]'s omeprazole. I am also having her maintain her warfarin, diphenhydrAMINE, Bisacodyl (LAXATIVE PO), ibuprofen, dofetilide, diltiazem, Magnesium, tamoxifen, Tdap, albuterol, warfarin, metoprolol succinate, and furosemide.  No orders of the defined types were placed in this encounter.   Medications Discontinued During This Encounter  Medication Reason  . omeprazole (PRILOSEC) 20 MG capsule Discontinued by provider    Follow-up: No Follow-up on file.   Crecencio Mc, MD

## 2016-11-27 NOTE — Patient Instructions (Signed)
I think your venous stasis is complicating your wound healing/  I recommend seeing a wound specialist to help manage dthe wound . If dr dasher disagrees, let me know and I 'll cancel the referral   Elevate your leg as much as possible

## 2016-11-28 DIAGNOSIS — T8149XA Infection following a procedure, other surgical site, initial encounter: Secondary | ICD-10-CM | POA: Insufficient documentation

## 2016-11-28 DIAGNOSIS — Z481 Encounter for planned postprocedural wound closure: Secondary | ICD-10-CM | POA: Insufficient documentation

## 2016-11-28 NOTE — Assessment & Plan Note (Signed)
The wound has been open for two weeks [pst excision of squamous cell Ca.  It has not improved.  Referring to [place] for management.

## 2016-12-02 ENCOUNTER — Telehealth: Payer: Self-pay

## 2016-12-02 NOTE — Telephone Encounter (Signed)
Patient states you had mentioned her seeing wound care for right leg . Patient states dermatology prefers she hold off on seeing wound care for now .  Patient just wanted to make you aware.

## 2016-12-02 NOTE — Telephone Encounter (Signed)
done

## 2016-12-02 NOTE — Telephone Encounter (Signed)
MELISSA, CANCEL THE WOUND CARE CONSULT I ORDERED

## 2016-12-24 ENCOUNTER — Ambulatory Visit: Payer: No Typology Code available for payment source

## 2016-12-24 DIAGNOSIS — I480 Paroxysmal atrial fibrillation: Secondary | ICD-10-CM | POA: Diagnosis not present

## 2016-12-25 DIAGNOSIS — Z961 Presence of intraocular lens: Secondary | ICD-10-CM | POA: Diagnosis not present

## 2016-12-26 ENCOUNTER — Ambulatory Visit (INDEPENDENT_AMBULATORY_CARE_PROVIDER_SITE_OTHER): Payer: Medicare Other

## 2016-12-26 VITALS — BP 130/70 | HR 82 | Temp 97.6°F | Resp 14 | Ht 67.0 in | Wt 177.4 lb

## 2016-12-26 DIAGNOSIS — Z0000 Encounter for general adult medical examination without abnormal findings: Secondary | ICD-10-CM | POA: Diagnosis not present

## 2016-12-26 NOTE — Progress Notes (Signed)
Subjective:   Carol [name] is a 76 y.o. female who presents for Medicare Annual (Subsequent) preventive examination.  Review of Systems:  No ROS.  Medicare Wellness Visit.  Cardiac Risk Factors include: advanced age (>24men, >62 women);obesity (BMI >30kg/m2) Sleep patterns: Gets up once to use restroom. Sleeps 5-7 hours per night. Wakes up feeling rested.   Home Safety/Smoke Alarms:  Feels safe in home. Smoke alarms in place.  Living environment; residence and Firearm Safety: 1-story house/ trailer, firearms stored safely. Seat Belt Safety/Bike Helmet: Wears seat belt.   Counseling:   Eye Exam- 12/25/16  Dental- Cleaning every 6 months.  Female:   Pap- Aged out.      Mammo- 05/18/2014, Scheduled for April       Dexa scan-  02/12/2011. Discussed.   CCS- 02/12/2007 -Normal per pt. Sees Dr Vira Agar with GI. Deferred per pt request at this time.     Objective:     Vitals: BP 130/70 (BP Location: Left Arm, Patient Position: Sitting, Cuff Size: Normal)   Pulse 82   Temp 97.6 F (36.4 C) (Oral)   Resp 14   Ht 5\' 7"  (1.702 m)   Wt 177 lb 6.4 oz (80.5 kg)   SpO2 97%   BMI 27.78 kg/m   Body mass index is 27.78 kg/m.   Tobacco History  Smoking Status  . Never Smoker  Smokeless Tobacco  . Never Used     Counseling given: Not Answered   Past Medical History:  Diagnosis Date  . Chronic sinusitis   . Fibrocystic breast disease   . History of pneumonia 1999  . Hyperlipidemia   . Irritable bowel syndrome   . Lichen planus   . 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 Mild tricuspid insufficiency Jan 2012   ECHO, [place]  . Moderate mitral insufficiency JAN 2012   ECHO, [place]   Past Surgical History:  Procedure Laterality Date  . ABDOMINAL HYSTERECTOMY     precancerous cervix,    . ABLATION OF DYSRHYTHMIC FOCUS  August 2013   [facility], Dr. Marcello Moores  . BREAST SURGERY     bilateral, benign biopsies  . MAXILLARY SINUS LIFT  1990's   [name]  .  oophorectomy    . squamous cell removal Right    right leg calf area.   Family History  Problem Relation Age of Onset  . Cancer Mother     Breast  . Hyperlipidemia Father   . Heart disease Father   . Hypertension Father   . Kidney disease Father   . Cancer Maternal Grandfather     colon CA   History  Sexual Activity  . Sexual activity: No    Outpatient Encounter Prescriptions as of 12/26/2016  Medication Sig  . albuterol (PROVENTIL HFA;VENTOLIN HFA) 108 (90 BASE) MCG/ACT inhaler Inhale 2 puffs into the lungs every 6 (six) hours as needed.  . Bisacodyl (LAXATIVE PO) Take 1 tablet by mouth daily as needed. Reported on 11/28/2015  . diltiazem (CARDIZEM) 60 MG tablet Take 1 tablet (60 mg total) by mouth as needed.  . diphenhydrAMINE (BENADRYL) 25 mg capsule Take 25 mg by mouth every 6 (six) hours as needed.  . dofetilide (TIKOSYN) 250 MCG capsule Take 250 mcg by mouth 2 (two) times daily.  . furosemide (LASIX) 20 MG tablet TAKE 1 TABLET EVERY DAY  . ibuprofen (ADVIL,MOTRIN) 200 MG tablet Take 200 mg by mouth every 6 (six) hours as needed.  . Magnesium 400  MG CAPS Take 1 capsule by mouth 2 (two) times daily.  . metoprolol succinate (TOPROL-XL) 50 MG 24 hr tablet Take 25 mg by mouth daily.  . tamoxifen (NOLVADEX) 20 MG tablet Take 20 mg by mouth daily.  Marland Kitchen warfarin (COUMADIN) 1 MG tablet Take 1 mg by mouth daily.  Marland Kitchen warfarin (COUMADIN) 6 MG tablet Take 6 mg by mouth daily.  . Tdap (BOOSTRIX) 5-2.5-18.5 LF-MCG/0.5 injection Inject 0.5 mLs into the muscle once. (Patient not taking: Reported on 05/27/2016)   No facility-administered encounter medications on file as of 12/26/2016.     Activities of Daily Living In your present state of health, do you have any difficulty performing the following activities: 12/26/2016  Hearing? N  Vision? N  Difficulty concentrating or making decisions? N  Walking or climbing stairs? N  Dressing or bathing? N  Doing errands, shopping? N  Preparing Food  and eating ? N  Using the Toilet? N  In the past six months, have you accidently leaked urine? N  Do you have problems with loss of bowel control? N  Managing your Medications? N  Managing your Finances? N  Housekeeping or managing your Housekeeping? N  Some recent data might be hidden    Patient Care Team: Crecencio Mc, MD as PCP - General (Internal Medicine) Corey Skains, MD as Referring Physician (Internal Medicine)    Assessment:    No CPE. AWV only.  Exercise Activities and Dietary recommendations Current Exercise Habits: Home exercise routine, Type of exercise: walking;stretching, Time (Minutes): 30, Frequency (Times/Week): 7, Weekly Exercise (Minutes/Week): 210   .Diet (meal preparation, eat out, water intake, caffeinated beverages, dairy products, fruits and vegetables):  Breakfast: Fruit and yogurt, 32 oz water, 1 cup coffee Lunch: Sometimes skipped or something light Dinner: Meat and vegetables Drinks atleast 2 glasses of water per meal.   Goals    . Healthy Lifestyle          Stay hydrated, continue drinking plenty of fluids Eat a healthy diet, choose lean meats, fresh fruits and vegetables Maintain exercise regiment    . Lose weight      Fall Risk Fall Risk  12/26/2016 11/27/2015 11/07/2014  Falls in the past year? No Yes No  Number falls in past yr: - 1 -  Injury with Fall? - No -  Follow up - Education provided;Falls prevention discussed -   Depression Screen PHQ 2/9 Scores 12/26/2016 11/27/2015 11/07/2014  PHQ - 2 Score 0 0 0     Cognitive Function MMSE - Mini Mental State Exam 12/26/2016 11/27/2015  Orientation to time 5 5  Orientation to Place 5 5  Registration 3 3  Attention/ Calculation 4 5  Recall 2 3  Language- name 2 objects 2 2  Language- repeat 1 1  Language- follow 3 step command 3 3  Language- read & follow direction 1 1  Write a sentence 1 1  Copy design 1 1  Total score 28 30        Immunization History  Administered Date(s)  Administered  . Influenza, High Dose Seasonal PF 09/26/2016  . Influenza, Seasonal, Injecte, Preservative Fre 11/24/2012  . Influenza,inj,Quad PF,36+ Mos 08/24/2013, 09/15/2014, 08/30/2015  . Influenza-Unspecified 08/04/2013, 08/07/2014, 09/19/2015  . Pneumococcal Conjugate-13 11/27/2015  . Pneumococcal Polysaccharide-23 11/05/2013  . Tdap 11/28/2011  . Zoster 05/17/2008   Screening Tests Health Maintenance  Topic Date Due  . TETANUS/TDAP  11/27/2021  . INFLUENZA VACCINE  Completed  . DEXA SCAN  Completed  .  PNA vac Low Risk Adult  Completed      Plan:    During the course of the visit the patient was educated and counseled about the following appropriate screening and preventive services:   Vaccines to include Pneumoccal, Influenza, Hepatitis B, Td, Zostavax, HCV  Cardiovascular Disease  Colorectal cancer screening  Bone density screening  Diabetes screening  Glaucoma screening  Mammography/PAP  Nutrition counseling   Patient Instructions (the written plan) was given to the patient.   Ree Edman, RN  12/26/2016

## 2016-12-26 NOTE — Patient Instructions (Addendum)
  Carol [name] , Thank you for taking time to come for your Medicare Wellness Visit. I appreciate your ongoing commitment to your health goals. Please review the following plan we discussed and let me know if I can assist you in the future.   These are the goals we discussed: Goals    . Healthy Lifestyle          Stay hydrated, continue drinking plenty of fluids Eat a healthy diet, choose lean meats, fresh fruits and vegetables Maintain exercise regiment    . Lose weight       This is a list of the screening recommended for you and due dates:  Health Maintenance  Topic Date Due  . Tetanus Vaccine  11/27/2021  . Flu Shot  Completed  . DEXA scan (bone density measurement)  Completed  . Pneumonia vaccines  Completed     Bone Densitometry Introduction Bone densitometry is an imaging test that uses a special X-ray to measure the amount of calcium and other minerals in your bones (bone density). This test is also known as a bone mineral density test or dual-energy X-ray absorptiometry (DXA). The test can measure bone density at your hip and your spine. It is similar to having a regular X-ray. You may have this test to:  Diagnose a condition that causes weak or thin bones (osteoporosis).  Predict your risk of a broken bone (fracture).  Determine how well osteoporosis treatment is working. Tell a health care provider about:  Any allergies you have.  All medicines you are taking, including vitamins, herbs, eye drops, creams, and over-the-counter medicines.  Any problems you or family members have had with anesthetic medicines.  Any blood disorders you have.  Any surgeries you have had.  Any medical conditions you have.  Possibility of pregnancy.  Any other medical test you had within the previous 14 days that used contrast material. What are the risks? Generally, this is a safe procedure. However, problems can occur and may include the following:  This test exposes you to  a very small amount of radiation.  The risks of radiation exposure may be greater to unborn children. What happens before the procedure?  Do not take any calcium supplements for 24 hours before having the test. You can otherwise eat and drink what you usually do.  Take off all metal jewelry, eyeglasses, dental appliances, and any other metal objects. What happens during the procedure?  You may lie on an exam table. There will be an X-ray generator below you and an imaging device above you.  Other devices, such as boxes or braces, may be used to position your body properly for the scan.  You will need to lie still while the machine slowly scans your body.  The images will show up on a computer monitor. What happens after the procedure? You may need more testing at a later time. This information is not intended to replace advice given to you by your health care provider. Make sure you discuss any questions you have with your health care provider. Document Released: 11/12/2004 Document Revised: 03/28/2016 Document Reviewed: 03/31/2014  2017 Elsevier

## 2016-12-29 NOTE — Progress Notes (Signed)
  I have reviewed the above information and agree with above.   Willona Phariss, MD 

## 2017-01-09 DIAGNOSIS — Z5181 Encounter for therapeutic drug level monitoring: Secondary | ICD-10-CM | POA: Diagnosis not present

## 2017-01-09 DIAGNOSIS — I10 Essential (primary) hypertension: Secondary | ICD-10-CM | POA: Insufficient documentation

## 2017-01-09 DIAGNOSIS — I480 Paroxysmal atrial fibrillation: Secondary | ICD-10-CM | POA: Diagnosis not present

## 2017-01-09 DIAGNOSIS — Z79899 Other long term (current) drug therapy: Secondary | ICD-10-CM | POA: Diagnosis not present

## 2017-01-30 DIAGNOSIS — I480 Paroxysmal atrial fibrillation: Secondary | ICD-10-CM | POA: Diagnosis not present

## 2017-02-26 DIAGNOSIS — R42 Dizziness and giddiness: Secondary | ICD-10-CM | POA: Diagnosis not present

## 2017-02-26 DIAGNOSIS — R51 Headache: Secondary | ICD-10-CM | POA: Diagnosis not present

## 2017-02-26 DIAGNOSIS — Z9011 Acquired absence of right breast and nipple: Secondary | ICD-10-CM | POA: Diagnosis not present

## 2017-02-26 DIAGNOSIS — C50411 Malignant neoplasm of upper-outer quadrant of right female breast: Secondary | ICD-10-CM | POA: Diagnosis not present

## 2017-02-26 DIAGNOSIS — Z853 Personal history of malignant neoplasm of breast: Secondary | ICD-10-CM | POA: Diagnosis not present

## 2017-02-26 DIAGNOSIS — Z08 Encounter for follow-up examination after completed treatment for malignant neoplasm: Secondary | ICD-10-CM | POA: Diagnosis not present

## 2017-02-26 DIAGNOSIS — Z79810 Long term (current) use of selective estrogen receptor modulators (SERMs): Secondary | ICD-10-CM | POA: Diagnosis not present

## 2017-02-26 DIAGNOSIS — M1990 Unspecified osteoarthritis, unspecified site: Secondary | ICD-10-CM | POA: Diagnosis not present

## 2017-02-26 DIAGNOSIS — Z9289 Personal history of other medical treatment: Secondary | ICD-10-CM | POA: Diagnosis not present

## 2017-02-26 DIAGNOSIS — C50919 Malignant neoplasm of unspecified site of unspecified female breast: Secondary | ICD-10-CM | POA: Diagnosis not present

## 2017-02-26 DIAGNOSIS — T386X5D Adverse effect of antigonadotrophins, antiestrogens, antiandrogens, not elsewhere classified, subsequent encounter: Secondary | ICD-10-CM | POA: Diagnosis not present

## 2017-02-26 DIAGNOSIS — Z85828 Personal history of other malignant neoplasm of skin: Secondary | ICD-10-CM | POA: Diagnosis not present

## 2017-02-26 DIAGNOSIS — Z170 Estrogen receptor positive status [ER+]: Secondary | ICD-10-CM | POA: Diagnosis not present

## 2017-02-26 DIAGNOSIS — Z9221 Personal history of antineoplastic chemotherapy: Secondary | ICD-10-CM | POA: Diagnosis not present

## 2017-02-26 DIAGNOSIS — C8580 Other specified types of non-Hodgkin lymphoma, unspecified site: Secondary | ICD-10-CM | POA: Diagnosis not present

## 2017-02-26 DIAGNOSIS — Z923 Personal history of irradiation: Secondary | ICD-10-CM | POA: Diagnosis not present

## 2017-02-26 DIAGNOSIS — I482 Chronic atrial fibrillation: Secondary | ICD-10-CM | POA: Diagnosis not present

## 2017-02-26 DIAGNOSIS — Z8572 Personal history of non-Hodgkin lymphomas: Secondary | ICD-10-CM | POA: Diagnosis not present

## 2017-02-26 DIAGNOSIS — R232 Flushing: Secondary | ICD-10-CM | POA: Diagnosis not present

## 2017-02-27 ENCOUNTER — Other Ambulatory Visit: Payer: Self-pay | Admitting: Internal Medicine

## 2017-02-27 DIAGNOSIS — I480 Paroxysmal atrial fibrillation: Secondary | ICD-10-CM | POA: Diagnosis not present

## 2017-03-04 DIAGNOSIS — D2272 Melanocytic nevi of left lower limb, including hip: Secondary | ICD-10-CM | POA: Diagnosis not present

## 2017-03-04 DIAGNOSIS — X32XXXA Exposure to sunlight, initial encounter: Secondary | ICD-10-CM | POA: Diagnosis not present

## 2017-03-04 DIAGNOSIS — D225 Melanocytic nevi of trunk: Secondary | ICD-10-CM | POA: Diagnosis not present

## 2017-03-04 DIAGNOSIS — L570 Actinic keratosis: Secondary | ICD-10-CM | POA: Diagnosis not present

## 2017-03-04 DIAGNOSIS — D2261 Melanocytic nevi of right upper limb, including shoulder: Secondary | ICD-10-CM | POA: Diagnosis not present

## 2017-03-04 DIAGNOSIS — Z85828 Personal history of other malignant neoplasm of skin: Secondary | ICD-10-CM | POA: Diagnosis not present

## 2017-03-27 DIAGNOSIS — I480 Paroxysmal atrial fibrillation: Secondary | ICD-10-CM | POA: Diagnosis not present

## 2017-03-28 ENCOUNTER — Encounter: Payer: Self-pay | Admitting: Family Medicine

## 2017-03-28 ENCOUNTER — Ambulatory Visit (INDEPENDENT_AMBULATORY_CARE_PROVIDER_SITE_OTHER): Payer: Medicare Other | Admitting: Family Medicine

## 2017-03-28 ENCOUNTER — Ambulatory Visit (INDEPENDENT_AMBULATORY_CARE_PROVIDER_SITE_OTHER): Payer: Medicare Other

## 2017-03-28 VITALS — BP 112/80 | HR 90 | Temp 98.2°F | Wt 176.6 lb

## 2017-03-28 DIAGNOSIS — M542 Cervicalgia: Secondary | ICD-10-CM

## 2017-03-28 DIAGNOSIS — M47812 Spondylosis without myelopathy or radiculopathy, cervical region: Secondary | ICD-10-CM | POA: Diagnosis not present

## 2017-03-28 MED ORDER — BACLOFEN 10 MG PO TABS: 5.0000 mg | ORAL_TABLET | Freq: Three times a day (TID) | ORAL | 0 refills | Status: DC

## 2017-03-28 NOTE — Patient Instructions (Addendum)
Nice to see you. You likely have a pinched nerve in your neck. We will obtain an x-ray of your neck today. We will start you on baclofen as a muscle relaxer. This may make you drowsy so be careful. If you develop numbness, weakness, worsening pain, or new symptoms please seek medical attention.

## 2017-03-28 NOTE — Progress Notes (Signed)
Tommi Rumps, MD Phone: [phone]  Carol [name] is a 76 y.o. female who presents today for same-day visit.  Neck pain: Patient notes onset of neck pain several days ago. Started radiating down her left arm in the radial aspect distribution. Notes depends on how she sits and it would be fine though if she moves her head in the wrong direction it will hurt. Shoots down her left arm at times. Numbness over the proximal radial aspect of her forearm though no numbness elsewhere in the arm. The numbness is intermittent. No weakness. No known injury. She's tried heat and Advil. Patient is on Coumadin.  PMH: nonsmoker.   ROS see history of present illness  Objective  Physical Exam Vitals:   03/28/17 1030 03/28/17 1053  BP: (!) 100/52 112/80  Pulse: 90   Temp: 98.2 F (36.8 C)     BP Readings from Last 3 Encounters:  03/28/17 112/80  12/26/16 130/70  11/27/16 122/84   Wt Readings from Last 3 Encounters:  03/28/17 176 lb 9.6 oz (80.1 kg)  12/26/16 177 lb 6.4 oz (80.5 kg)  11/27/16 176 lb 6 oz (80 kg)    Physical Exam  Constitutional: No distress.  Cardiovascular: Normal rate, regular rhythm and normal heart sounds.   Pulmonary/Chest: Effort normal and breath sounds normal.  Musculoskeletal:  No midline spine tenderness, no midline spine step-off, no muscular neck or back tenderness, there is discomfort on rotation and lateral flexion of her neck as well as flexion and extension of her neck where the discomfort occurs in the left trapezius and rhomboids, negative Spurling's  Neurological: She is alert. Gait normal.  CN 2-12 intact, 5/5 strength in bilateral biceps, triceps, grip, quads, hamstrings, plantar and dorsiflexion, sensation to light touch intact in bilateral UE and LE, normal gait  Skin: Skin is warm and dry. She is not diaphoretic.     Assessment/Plan: Please see individual problem list.  Neck pain Patient with neck pain and likely radiculopathy in  the C6 distribution. She is neurologically intact at this time. Discussed that this was likely related to nerve impingement given localized distribution and radicular symptoms. We'll obtain an x-ray of her neck. She is unable to tolerate prednisone. Will try baclofen as a muscle relaxer. I did discuss that she needs to try to limit her use of ibuprofen given that it can increase her risk of bleeding with her Coumadin. She voiced understanding. She'll try to stick to the baclofen. Given return precautions.   Orders Placed This Encounter  Procedures  . DG Cervical Spine Complete    Standing Status:   Future    Number of Occurrences:   1    Standing Expiration Date:   05/28/2018    Order Specific Question:   Reason for Exam (SYMPTOM  OR DIAGNOSIS REQUIRED)    Answer:   neck pain, radiating down left arm, intermittent radial aspect forearm numbness    Order Specific Question:   Preferred imaging location?    Answer:   [facility] Specific Question:   Radiology Contrast Protocol - do NOT remove file path    Answer:   [email or web address]    Meds ordered this encounter  Medications  . baclofen (LIORESAL) 10 MG tablet    Sig: Take 0.5 tablets (5 mg total) by mouth 3 (three) times daily.    Dispense:  10 each    Refill:  0   Tommi Rumps, MD [place]

## 2017-03-28 NOTE — Assessment & Plan Note (Signed)
Patient with neck pain and likely radiculopathy in the C6 distribution. She is neurologically intact at this time. Discussed that this was likely related to nerve impingement given localized distribution and radicular symptoms. We'll obtain an x-ray of her neck. She is unable to tolerate prednisone. Will try baclofen as a muscle relaxer. I did discuss that she needs to try to limit her use of ibuprofen given that it can increase her risk of bleeding with her Coumadin. She voiced understanding. She'll try to stick to the baclofen. Given return precautions.

## 2017-04-03 ENCOUNTER — Telehealth: Payer: Self-pay | Admitting: *Deleted

## 2017-04-03 DIAGNOSIS — M5412 Radiculopathy, cervical region: Secondary | ICD-10-CM

## 2017-04-03 NOTE — Telephone Encounter (Signed)
Dr Caryl Bis saw her for this problem,  But I will order the MRI.

## 2017-04-03 NOTE — Telephone Encounter (Signed)
Patient was advised to call the office if she decided to have her MRI for her neck . Pt wishes to proceed with the test.  Pt contact [phone]

## 2017-04-03 NOTE — Telephone Encounter (Signed)
Please advise 

## 2017-04-04 NOTE — Telephone Encounter (Signed)
Patient has been notified

## 2017-04-09 ENCOUNTER — Telehealth: Payer: Self-pay | Admitting: Internal Medicine

## 2017-04-09 NOTE — Telephone Encounter (Signed)
Pt called returning your call. Please advise, thank you!  Call pt @ [phone]

## 2017-04-15 ENCOUNTER — Ambulatory Visit: Payer: No Typology Code available for payment source

## 2017-04-16 ENCOUNTER — Ambulatory Visit: Payer: No Typology Code available for payment source

## 2017-04-17 DIAGNOSIS — I10 Essential (primary) hypertension: Secondary | ICD-10-CM | POA: Diagnosis not present

## 2017-04-17 DIAGNOSIS — I480 Paroxysmal atrial fibrillation: Secondary | ICD-10-CM | POA: Diagnosis not present

## 2017-04-17 DIAGNOSIS — Z5181 Encounter for therapeutic drug level monitoring: Secondary | ICD-10-CM | POA: Diagnosis not present

## 2017-04-17 DIAGNOSIS — I341 Nonrheumatic mitral (valve) prolapse: Secondary | ICD-10-CM | POA: Diagnosis not present

## 2017-04-17 DIAGNOSIS — Z79899 Other long term (current) drug therapy: Secondary | ICD-10-CM | POA: Diagnosis not present

## 2017-04-21 ENCOUNTER — Ambulatory Visit
Admission: RE | Admit: 2017-04-21 | Discharge: 2017-04-21 | Disposition: A | Discharge status: Home / Self Care | Payer: Medicare Other | Source: Ambulatory Visit | Attending: Internal Medicine | Admitting: Internal Medicine

## 2017-04-21 DIAGNOSIS — M4802 Spinal stenosis, cervical region: Secondary | ICD-10-CM | POA: Diagnosis not present

## 2017-04-21 DIAGNOSIS — M5412 Radiculopathy, cervical region: Secondary | ICD-10-CM | POA: Diagnosis present

## 2017-04-25 ENCOUNTER — Telehealth: Payer: Self-pay | Admitting: Internal Medicine

## 2017-04-25 NOTE — Telephone Encounter (Signed)
Pt called back returning your call in regards to results. Please advise, thank you!  Call pt@ [phone]

## 2017-04-25 NOTE — Telephone Encounter (Signed)
See result note message 

## 2017-05-20 DIAGNOSIS — I480 Paroxysmal atrial fibrillation: Secondary | ICD-10-CM | POA: Diagnosis not present

## 2017-05-21 ENCOUNTER — Telehealth: Payer: Self-pay | Admitting: Radiology

## 2017-05-21 DIAGNOSIS — E7800 Pure hypercholesterolemia, unspecified: Secondary | ICD-10-CM

## 2017-05-21 NOTE — Addendum Note (Signed)
Addended by: Crecencio Mc on: 05/21/2017 12:55 PM   Modules accepted: Orders

## 2017-05-21 NOTE — Telephone Encounter (Signed)
Pt coming in for labs on Monday July 30th, please place future orders. Thank you.

## 2017-05-22 ENCOUNTER — Other Ambulatory Visit: Payer: Self-pay | Admitting: Family Medicine

## 2017-05-25 ENCOUNTER — Other Ambulatory Visit: Payer: Self-pay | Admitting: Family Medicine

## 2017-05-28 ENCOUNTER — Encounter: Payer: No Typology Code available for payment source | Admitting: Internal Medicine

## 2017-06-02 ENCOUNTER — Other Ambulatory Visit: Payer: No Typology Code available for payment source

## 2017-06-15 IMAGING — DX DG CERVICAL SPINE COMPLETE 4+V
6 series · 6 of 6 positions shown · non-contrast
Comparison: None

CLINICAL DATA: Neck pain radiating down LEFT arm, intermittent
numbness at radial aspect of forearm

EXAM:
CERVICAL SPINE - COMPLETE 4+ VIEW

[cervical spine ap]
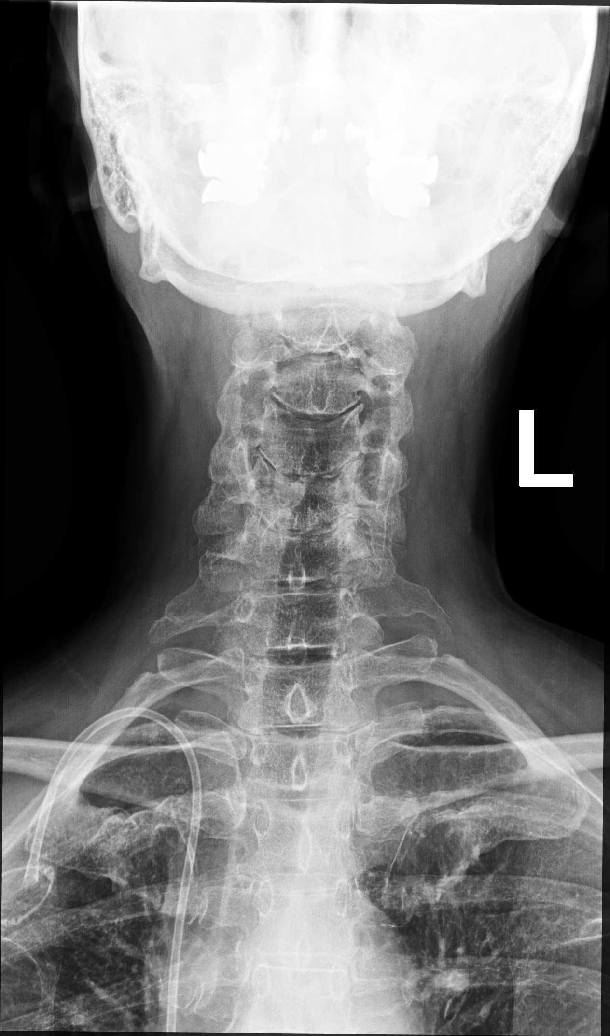

[cervical spine oblique (1 of 2)]
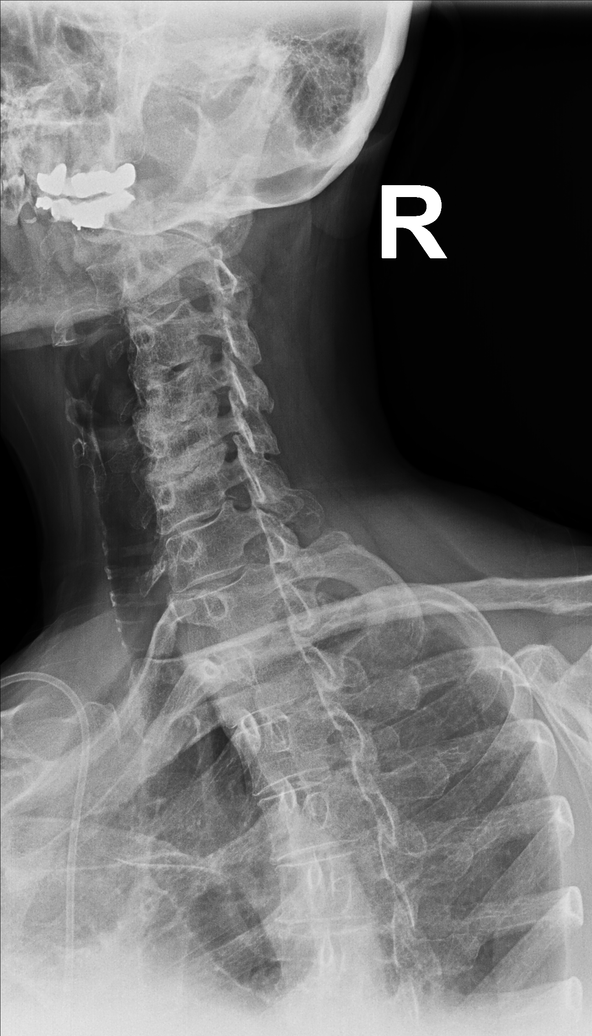

[cervical spine oblique (2 of 2)]
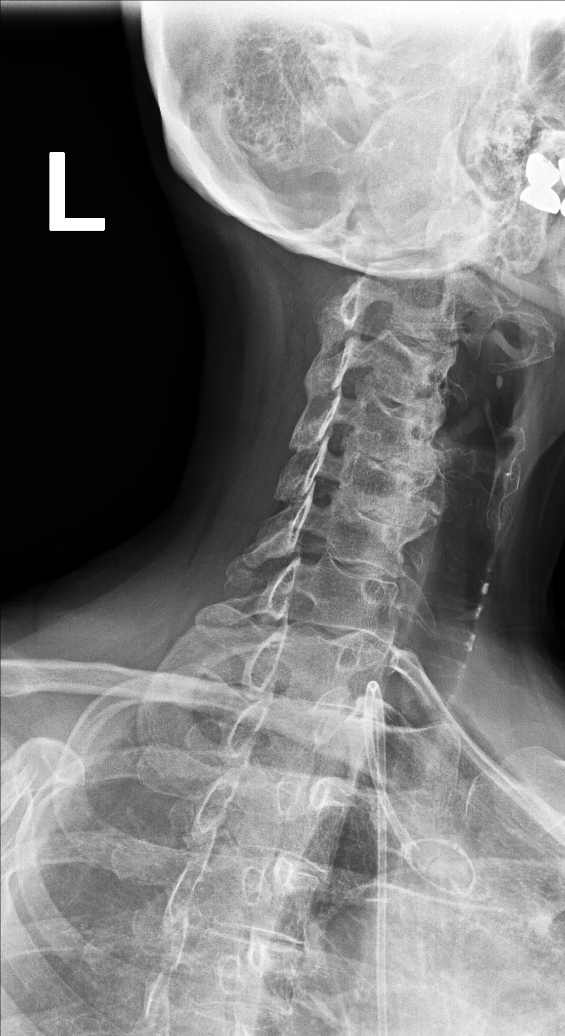

[cervical spine lat]
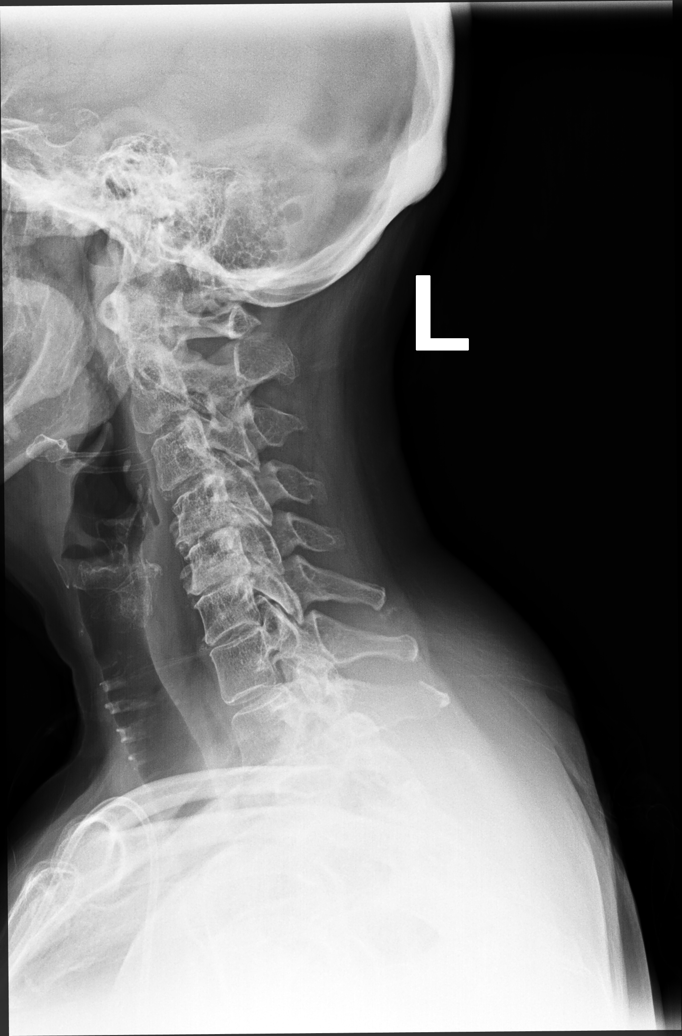

[swimmers lat]
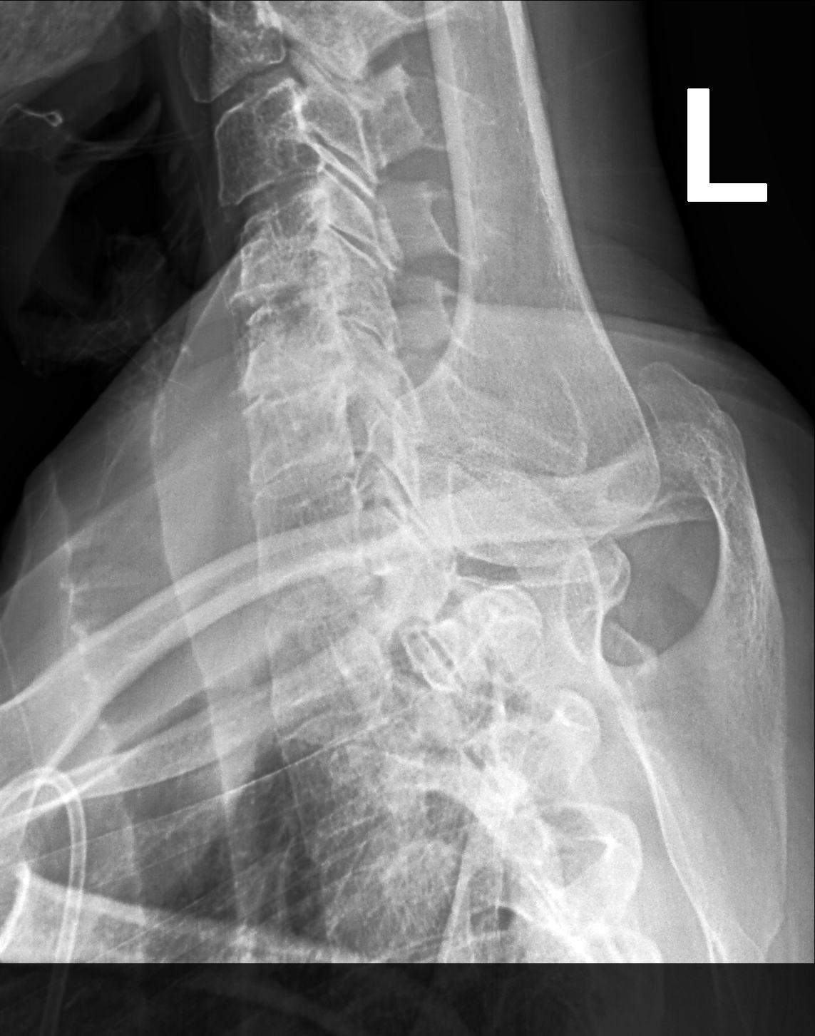

[cervical spine open mouth ap]
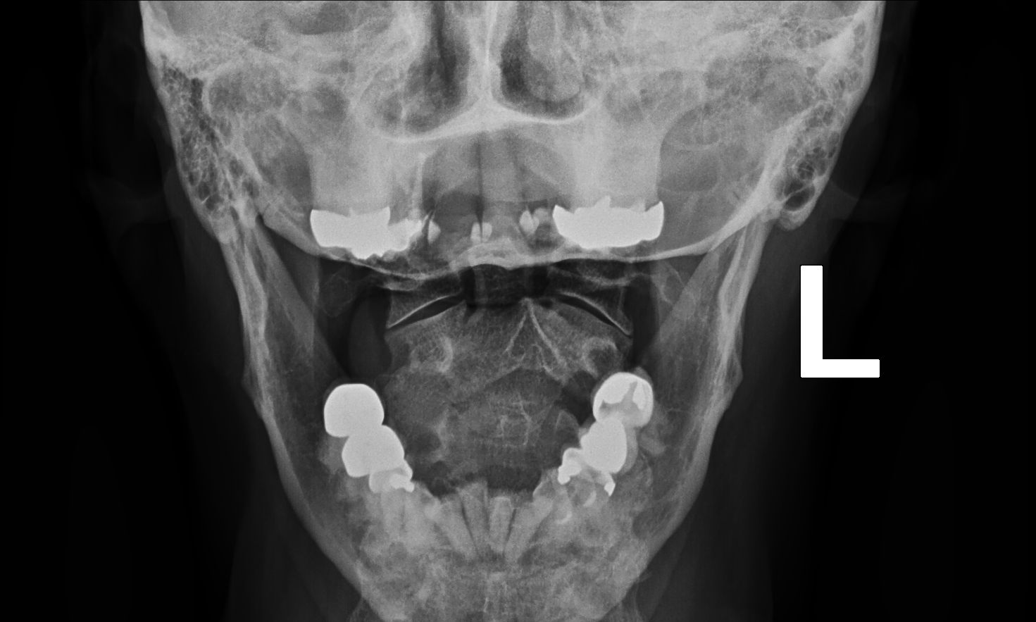

[6 of 6 positions shown; findings below may reference images not displayed]

FINDINGS: Osseous demineralization.

Prevertebral soft tissues normal thickness.

Reversal cervical lordosis question muscle spasm.

Disc space narrowing C3-C4 through C6-C7 with anterior endplate
spurs at C4-C5 and C5-C6.

Vertebral body heights maintained without fracture or bone
destruction.

Mild retrolisthesis at C5-C6.

Remaining alignments normal.

Mild scattered facet degenerative changes.

Uncovertebral spurs encroach upon the cervical neural foramina
bilaterally at C3-C4, C4-C5 and C5-C6, greater on LEFT.

BILATERAL cervical ribs.

Lung apices clear.

RIGHT jugular Port-A-Cath noted.
IMPRESSION: Degenerative disc and facet disease changes of the cervical spine as
above.

If patient's symptoms are radicular in character consider MR imaging
of the cervical spine for further evaluation.

## 2017-06-18 DIAGNOSIS — I480 Paroxysmal atrial fibrillation: Secondary | ICD-10-CM | POA: Diagnosis not present

## 2017-07-09 DIAGNOSIS — C8599 Non-Hodgkin lymphoma, unspecified, extranodal and solid organ sites: Secondary | ICD-10-CM | POA: Diagnosis not present

## 2017-07-09 DIAGNOSIS — K1230 Oral mucositis (ulcerative), unspecified: Secondary | ICD-10-CM | POA: Insufficient documentation

## 2017-07-10 DIAGNOSIS — Z5181 Encounter for therapeutic drug level monitoring: Secondary | ICD-10-CM | POA: Diagnosis not present

## 2017-07-10 DIAGNOSIS — Z79899 Other long term (current) drug therapy: Secondary | ICD-10-CM | POA: Diagnosis not present

## 2017-07-10 DIAGNOSIS — I480 Paroxysmal atrial fibrillation: Secondary | ICD-10-CM | POA: Diagnosis not present

## 2017-07-11 ENCOUNTER — Encounter: Payer: Self-pay | Admitting: Internal Medicine

## 2017-07-11 ENCOUNTER — Ambulatory Visit (INDEPENDENT_AMBULATORY_CARE_PROVIDER_SITE_OTHER): Payer: Medicare Other | Admitting: Internal Medicine

## 2017-07-11 VITALS — BP 126/80 | HR 73 | Temp 98.2°F | Resp 16 | Ht 67.0 in | Wt 176.8 lb

## 2017-07-11 DIAGNOSIS — E559 Vitamin D deficiency, unspecified: Secondary | ICD-10-CM

## 2017-07-11 DIAGNOSIS — E7800 Pure hypercholesterolemia, unspecified: Secondary | ICD-10-CM

## 2017-07-11 DIAGNOSIS — R1013 Epigastric pain: Secondary | ICD-10-CM

## 2017-07-11 DIAGNOSIS — Z780 Asymptomatic menopausal state: Secondary | ICD-10-CM | POA: Diagnosis not present

## 2017-07-11 DIAGNOSIS — M542 Cervicalgia: Secondary | ICD-10-CM | POA: Diagnosis not present

## 2017-07-11 DIAGNOSIS — H9192 Unspecified hearing loss, left ear: Secondary | ICD-10-CM

## 2017-07-11 DIAGNOSIS — R5383 Other fatigue: Secondary | ICD-10-CM

## 2017-07-11 LAB — COMPREHENSIVE METABOLIC PANEL
ALT: 25 U/L (ref 0–35)
AST: 27 U/L (ref 0–37)
Albumin: 4.2 g/dL (ref 3.5–5.2)
Alkaline Phosphatase: 33 U/L — ABNORMAL LOW (ref 39–117)
BUN: 16 mg/dL (ref 6–23)
CALCIUM: 9.4 mg/dL (ref 8.4–10.5)
CHLORIDE: 97 meq/L (ref 96–112)
CO2: 27 meq/L (ref 19–32)
CREATININE: 0.94 mg/dL (ref 0.40–1.20)
GFR: 61.4 mL/min (ref >60.00)
Glucose, Bld: 89 mg/dL (ref 70–99)
Potassium: 4.7 mEq/L (ref 3.5–5.1)
Sodium: 132 mEq/L — ABNORMAL LOW (ref 135–145)
Total Bilirubin: 0.5 mg/dL (ref 0.2–1.2)
Total Protein: 6.5 g/dL (ref 6.0–8.3)

## 2017-07-11 LAB — LIPID PANEL
CHOL/HDL RATIO: 4
CHOLESTEROL: 201 mg/dL — AB (ref 0–200)
HDL: 52.1 mg/dL (ref >39.00)
NonHDL: 149.3
Triglycerides: 216 mg/dL — ABNORMAL HIGH (ref 0.0–149.0)
VLDL: 43.2 mg/dL — AB (ref 0.0–40.0)

## 2017-07-11 LAB — LDL CHOLESTEROL, DIRECT: LDL DIRECT: 117 mg/dL

## 2017-07-11 LAB — VITAMIN D 25 HYDROXY (VIT D DEFICIENCY, FRACTURES): VITD: 56.65 ng/mL (ref 30.00–100.00)

## 2017-07-11 LAB — TSH: TSH: 1.26 u[IU]/mL (ref 0.35–4.50)

## 2017-07-11 NOTE — Patient Instructions (Addendum)
Try taking 150 mg ranitidine twice daily to prevent gastritis  and reflux esophagitis   abd ultrasound to be ordered for evaluation  of gallbladder   Fasting labs today will include liver enzymes   The ShingRx vaccine is now available in local pharmacies and is much more protective thant Zostavax,  It is therefore ADVISED for all interested adults over 50 to prevent shingles

## 2017-07-11 NOTE — Progress Notes (Signed)
Patient ID: Carol [name], female    DOB: Jan 02, 1941  Age: 76 y.o. MRN: [id number]  The patient is here for annual preventive  examination and management of other chronic and acute problems.  H/o BRCA 2015 lumpectomy Carol [name] managing, last mammo 4 /18 S/p TAH/BSO DEXA scan due    The risk factors are reflected in the social history.  The roster of all physicians providing medical care to patient - is listed in the Snapshot section of the chart.  Activities of daily living:  The patient is 100% independent in all ADLs: dressing, toileting, feeding as well as independent mobility  Home safety : The patient has smoke detectors in the home. They wear seatbelts.  There are no firearms at home. There is no violence in the home.   There is no risks for hepatitis, STDs or HIV. There is no   history of blood transfusion. They have no travel history to infectious disease endemic areas of the world.  The patient has seen their dentist in the last six month. They have seen their eye doctor in the last year. They admit to slight hearing difficulty with regard to whispered voices and some television programs.  They have deferred audiologic testing in the last year.  They do not  have excessive sun exposure. Discussed the need for sun protection: hats, long sleeves and use of sunscreen if there is significant sun exposure.   Diet: the importance of a healthy diet is discussed. They do have a healthy diet.  The benefits of regular aerobic exercise were discussed. She walks 4 times per week ,  20 minutes.   Depression screen: there are no signs or vegative symptoms of depression- irritability, change in appetite, anhedonia, sadness/tearfullness.  Cognitive assessment: the patient manages all their financial and personal affairs and is actively engaged. They could relate day,date,year and events; recalled 2/3 objects at 3 minutes; performed clock-face test normally.  The following portions of the  patient's history were reviewed and updated as appropriate: allergies, current medications, past family history, past medical history,  past surgical history, past social history  and problem list.  Visual acuity was not assessed per patient preference since she has regular follow up with her ophthalmologist. Hearing and body mass index were assessed and reviewed.   During the course of the visit the patient was educated and counseled about appropriate screening and preventive services including : fall prevention , diabetes screening, nutrition counseling, colorectal cancer screening, and recommended immunizations.    CC: The primary encounter diagnosis was Postmenopausal estrogen deficiency. Diagnoses of Fatigue, unspecified type, Pure hypercholesterolemia, Vitamin D deficiency, Colicky epigastric pain, Neck pain, and Hearing loss of left ear, unspecified hearing loss type were also pertinent to this visit.  Cc: left hip pain radiates to lateral thigh .  No prior imaging of lumbar spine .  Does push ups on the cabinet  Uses a large ball to extend back. Pushed a lawn mower this morning pain radiates to ankle .  Had 2 episodes of chest pain,  Weeks apart. Attributed to Carol [name] attack.  Had been off of PPI due to atrial fib meds.  Neck pain with radiculopathy , abn MRI .  Her pain has resolved Still Taking tikosyn for atrial fib. Managed by Carol [name]   Costing her $700/month since [facility]. 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  Last PET scan August  2017  Lots of tamoxifen complaints but plans to keep taking it as long as she can have quality of life.     Last meal was 10 am :yogurt , fruit   [name] has a past medical history of Chronic sinusitis; Fibrocystic breast disease; History of pneumonia (1999); Hyperlipidemia; Irritable bowel syndrome; Lichen planus; lymphoma (August 2013);  Mild tricuspid insufficiency (Jan 2012); and Moderate mitral insufficiency (JAN 2012).   She has a past surgical history that includes Maxillary sinus lift (1990's); oophorectomy; Breast surgery; Abdominal hysterectomy; Ablation of dysrhythmic focus (August 2013); and squamous cell removal (Right).   Her family history includes Cancer in her maternal grandfather and mother; Heart disease in her father; Hyperlipidemia in her father; Hypertension in her father; Kidney disease in her father.She reports that she has never smoked. She has never used smokeless tobacco. She reports that she drinks about 2.4 oz of alcohol per week . She reports that she does not use drugs.  Outpatient Medications Prior to Visit  Medication Sig Dispense Refill  . albuterol (PROVENTIL HFA;VENTOLIN HFA) 108 (90 BASE) MCG/ACT inhaler Inhale 2 puffs into the lungs every 6 (six) hours as needed. 18 g 0  . baclofen (LIORESAL) 10 MG tablet TAKE 1/2 (ONE-HALF) TABLET BY MOUTH THREE TIMES DAILY 10 tablet 0  . Bisacodyl (LAXATIVE PO) Take 1 tablet by mouth daily as needed. Reported on 11/28/2015    . diltiazem (CARDIZEM) 60 MG tablet Take 1 tablet (60 mg total) by mouth as needed. 90 tablet 1  . diphenhydrAMINE (BENADRYL) 25 mg capsule Take 25 mg by mouth every 6 (six) hours as needed.    . dofetilide (TIKOSYN) 250 MCG capsule Take 250 mcg by mouth 2 (two) times daily.    . furosemide (LASIX) 20 MG tablet TAKE 1 TABLET EVERY DAY 90 tablet 2  . ibuprofen (ADVIL,MOTRIN) 200 MG tablet Take 200 mg by mouth every 6 (six) hours as needed.    . Magnesium 400 MG CAPS Take 1 capsule by mouth 2 (two) times daily. 180 capsule 3  . metoprolol succinate (TOPROL-XL) 50 MG 24 hr tablet Take 25 mg by mouth daily.    . tamoxifen (NOLVADEX) 20 MG tablet Take 20 mg by mouth daily.    . Tdap (BOOSTRIX) 5-2.5-18.5 LF-MCG/0.5 injection Inject 0.5 mLs into the muscle once. 0.5 mL 0  . warfarin (COUMADIN) 1 MG tablet Take 1 mg by mouth daily.    Marland Kitchen  warfarin (COUMADIN) 6 MG tablet Take 6 mg by mouth daily.     No facility-administered medications prior to visit.     Review of Systems   Patient denies headache, fevers, malaise, unintentional weight loss, skin rash, eye pain, sinus congestion and sinus pain, sore throat, dysphagia,  hemoptysis , cough, dyspnea, wheezing, chest pain, palpitations, orthopnea, edema, abdominal pain, nausea, melena, diarrhea, constipation, flank pain, dysuria, hematuria, urinary  Frequency, nocturia, numbness, tingling, seizures,  Focal weakness, Loss of consciousness,  Tremor, insomnia, depression, anxiety, and suicidal ideation.      Objective:  BP 126/80 (BP Location: Left Arm, Patient Position: Sitting, Cuff Size: Normal)   Pulse 73   Temp 98.2 F (36.8 C) (Oral)   Resp 16   Ht '5\' 7"'  (1.702 m)   Wt 176 lb 12.8 oz (80.2 kg)   SpO2 97%   BMI 27.69 kg/m   Physical Exam   General appearance: alert, cooperative and appears stated age Ears: normal TM's and external ear canals both ears  Throat: lips, mucosa, and tongue normal; teeth and gums normal Neck: no adenopathy, no carotid bruit, supple, symmetrical, trachea midline and thyroid not enlarged, symmetric, no tenderness/mass/nodules Back: symmetric, no curvature. ROM normal. No CVA tenderness. Lungs: clear to auscultation bilaterally Heart: regular rate and rhythm, S1, S2 normal, no murmur, click, rub or gallop Abdomen: soft, non-tender; bowel sounds normal; no masses,  no organomegaly Pulses: 2+ and symmetric Skin: Skin color, texture, turgor normal. No rashes or lesions Lymph nodes: Cervical, supraclavicular, and axillary nodes normal.    Assessment & Plan:   Problem List Items Addressed This Visit    Colicky epigastric pain    ULTRASOUND of abdomen orderedto eval for gallstones.  Advised to resume PPI therapy bid.       Relevant Orders   US Abdomen Limited RUQ   Hearing loss in left ear    Cerumen impaction rule dout.  Referral for  hearing test       Hyperlipidemia    10 yr risk is 14% due to age .  She has atherosclerotic calcification of her LAD on 2013 chest CT.  Will recommend statin trial.  Lab Results  Component Value Date   CHOL 201 (H) 07/11/2017   HDL 52.10 07/11/2017   LDLCALC 125 (H) 11/27/2016   LDLDIRECT 117.0 07/11/2017   TRIG 216.0 (H) 07/11/2017   CHOLHDL 4 07/11/2017           Relevant Orders   Lipid panel (Completed)   Neck pain    Secondary to DJD spine by previous MRI,  Pain is currently resolved.        Other Visit Diagnoses    Postmenopausal estrogen deficiency    -  Primary   Relevant Orders   DG Bone Density   Fatigue, unspecified type       Relevant Orders   Comprehensive metabolic panel (Completed)   TSH (Completed)   Vitamin D deficiency       Relevant Orders   VITAMIN D 25 Hydroxy (Vit-D Deficiency, Fractures) (Completed)    A total of 40 minutes was spent with patient more than half of which was spent in counseling patient on the above mentioned issues , reviewing and explaining recent labs and imaging studies done, and coordination of care.   I am having Ms. Grandville Silos maintain her warfarin, diphenhydrAMINE, Bisacodyl (LAXATIVE PO), ibuprofen, dofetilide, diltiazem, Magnesium, tamoxifen, Tdap, albuterol, warfarin, metoprolol succinate, furosemide, and baclofen.  No orders of the defined types were placed in this encounter.   There are no discontinued medications.  Follow-up: No Follow-up on file.   Crecencio Mc, MD

## 2017-07-13 DIAGNOSIS — H6121 Impacted cerumen, right ear: Secondary | ICD-10-CM | POA: Insufficient documentation

## 2017-07-13 DIAGNOSIS — R1013 Epigastric pain: Secondary | ICD-10-CM | POA: Insufficient documentation

## 2017-07-13 NOTE — Assessment & Plan Note (Signed)
Secondary to DJD spine by previous MRI,  Pain is currently resolved.

## 2017-07-13 NOTE — Assessment & Plan Note (Signed)
ULTRASOUND of abdomen orderedto eval for gallstones.  Advised to resume PPI therapy bid.

## 2017-07-13 NOTE — Assessment & Plan Note (Signed)
Cerumen impaction rule dout.  Referral for hearing test

## 2017-07-13 NOTE — Assessment & Plan Note (Addendum)
10 yr risk is 14% due to age .  She has atherosclerotic calcification of her LAD on 2013 chest CT.  Will recommend statin trial.  Lab Results  Component Value Date   CHOL 201 (H) 07/11/2017   HDL 52.10 07/11/2017   LDLCALC 125 (H) 11/27/2016   LDLDIRECT 117.0 07/11/2017   TRIG 216.0 (H) 07/11/2017   CHOLHDL 4 07/11/2017

## 2017-07-21 ENCOUNTER — Ambulatory Visit
Admission: RE | Admit: 2017-07-21 | Discharge: 2017-07-21 | Disposition: A | Discharge status: Home / Self Care | Payer: Medicare Other | Source: Ambulatory Visit | Attending: Internal Medicine | Admitting: Internal Medicine

## 2017-07-21 DIAGNOSIS — R1013 Epigastric pain: Secondary | ICD-10-CM | POA: Insufficient documentation

## 2017-07-21 DIAGNOSIS — K760 Fatty (change of) liver, not elsewhere classified: Secondary | ICD-10-CM | POA: Diagnosis not present

## 2017-07-22 DIAGNOSIS — I480 Paroxysmal atrial fibrillation: Secondary | ICD-10-CM | POA: Diagnosis not present

## 2017-07-23 ENCOUNTER — Encounter: Payer: Self-pay | Admitting: Internal Medicine

## 2017-07-23 DIAGNOSIS — K760 Fatty (change of) liver, not elsewhere classified: Secondary | ICD-10-CM | POA: Insufficient documentation

## 2017-07-25 ENCOUNTER — Telehealth: Payer: Self-pay | Admitting: *Deleted

## 2017-07-25 NOTE — Telephone Encounter (Signed)
Pt requested a call Pt contact [phone]

## 2017-07-25 NOTE — Telephone Encounter (Signed)
See result note message 

## 2017-08-01 DIAGNOSIS — Z780 Asymptomatic menopausal state: Secondary | ICD-10-CM | POA: Diagnosis not present

## 2017-08-01 LAB — HM DEXA SCAN: HM Dexa Scan: NORMAL

## 2017-08-06 ENCOUNTER — Telehealth: Payer: Self-pay | Admitting: Internal Medicine

## 2017-08-06 NOTE — Telephone Encounter (Signed)
DEXA scan was normal.  No osteoporosis.  Continue 1200 mg calcium daily through diet and supplements,.  1000 IUS Vit D3 daily and regular weight bearing exercise. Repeat in 5 years

## 2017-08-07 NOTE — Telephone Encounter (Signed)
Pt called back returning your call. Thank you! °

## 2017-08-07 NOTE — Telephone Encounter (Signed)
LMTCB

## 2017-08-07 NOTE — Telephone Encounter (Signed)
Patient is aware and agreed to comply.

## 2017-08-19 DIAGNOSIS — I480 Paroxysmal atrial fibrillation: Secondary | ICD-10-CM | POA: Diagnosis not present

## 2017-09-15 DIAGNOSIS — Z90721 Acquired absence of ovaries, unilateral: Secondary | ICD-10-CM | POA: Diagnosis not present

## 2017-09-15 DIAGNOSIS — M1990 Unspecified osteoarthritis, unspecified site: Secondary | ICD-10-CM | POA: Diagnosis not present

## 2017-09-15 DIAGNOSIS — Z23 Encounter for immunization: Secondary | ICD-10-CM | POA: Diagnosis not present

## 2017-09-15 DIAGNOSIS — Z85828 Personal history of other malignant neoplasm of skin: Secondary | ICD-10-CM | POA: Diagnosis not present

## 2017-09-15 DIAGNOSIS — Z79899 Other long term (current) drug therapy: Secondary | ICD-10-CM | POA: Diagnosis not present

## 2017-09-15 DIAGNOSIS — C8580 Other specified types of non-Hodgkin lymphoma, unspecified site: Secondary | ICD-10-CM | POA: Diagnosis not present

## 2017-09-15 DIAGNOSIS — C50511 Malignant neoplasm of lower-outer quadrant of right female breast: Secondary | ICD-10-CM | POA: Diagnosis not present

## 2017-09-15 DIAGNOSIS — Z7901 Long term (current) use of anticoagulants: Secondary | ICD-10-CM | POA: Diagnosis not present

## 2017-09-15 DIAGNOSIS — C8599 Non-Hodgkin lymphoma, unspecified, extranodal and solid organ sites: Secondary | ICD-10-CM | POA: Diagnosis not present

## 2017-09-15 DIAGNOSIS — Z9289 Personal history of other medical treatment: Secondary | ICD-10-CM | POA: Diagnosis not present

## 2017-09-15 DIAGNOSIS — I482 Chronic atrial fibrillation: Secondary | ICD-10-CM | POA: Diagnosis not present

## 2017-09-15 DIAGNOSIS — Z170 Estrogen receptor positive status [ER+]: Secondary | ICD-10-CM | POA: Diagnosis not present

## 2017-09-15 DIAGNOSIS — C50311 Malignant neoplasm of lower-inner quadrant of right female breast: Secondary | ICD-10-CM | POA: Diagnosis not present

## 2017-09-15 DIAGNOSIS — C50919 Malignant neoplasm of unspecified site of unspecified female breast: Secondary | ICD-10-CM | POA: Diagnosis not present

## 2017-09-15 DIAGNOSIS — Z90710 Acquired absence of both cervix and uterus: Secondary | ICD-10-CM | POA: Diagnosis not present

## 2017-09-15 DIAGNOSIS — Z803 Family history of malignant neoplasm of breast: Secondary | ICD-10-CM | POA: Diagnosis not present

## 2017-09-15 DIAGNOSIS — Z800 Family history of malignant neoplasm of digestive organs: Secondary | ICD-10-CM | POA: Diagnosis not present

## 2017-09-15 DIAGNOSIS — J45901 Unspecified asthma with (acute) exacerbation: Secondary | ICD-10-CM | POA: Diagnosis not present

## 2017-09-15 DIAGNOSIS — Z923 Personal history of irradiation: Secondary | ICD-10-CM | POA: Diagnosis not present

## 2017-09-15 DIAGNOSIS — K7581 Nonalcoholic steatohepatitis (NASH): Secondary | ICD-10-CM | POA: Diagnosis not present

## 2017-09-15 DIAGNOSIS — Z9221 Personal history of antineoplastic chemotherapy: Secondary | ICD-10-CM | POA: Diagnosis not present

## 2017-09-17 DIAGNOSIS — I480 Paroxysmal atrial fibrillation: Secondary | ICD-10-CM | POA: Diagnosis not present

## 2017-09-29 ENCOUNTER — Emergency Department
Admission: EM | Admit: 2017-09-29 | Discharge: 2017-09-30 | Disposition: A | Discharge status: Home / Self Care | Payer: Medicare Other | Attending: Emergency Medicine | Admitting: Emergency Medicine

## 2017-09-29 ENCOUNTER — Other Ambulatory Visit: Payer: Self-pay

## 2017-09-29 ENCOUNTER — Emergency Department: Payer: Medicare Other

## 2017-09-29 DIAGNOSIS — R29818 Other symptoms and signs involving the nervous system: Secondary | ICD-10-CM | POA: Diagnosis not present

## 2017-09-29 DIAGNOSIS — Z7901 Long term (current) use of anticoagulants: Secondary | ICD-10-CM | POA: Insufficient documentation

## 2017-09-29 DIAGNOSIS — Z79899 Other long term (current) drug therapy: Secondary | ICD-10-CM | POA: Diagnosis not present

## 2017-09-29 DIAGNOSIS — G454 Transient global amnesia: Secondary | ICD-10-CM | POA: Insufficient documentation

## 2017-09-29 DIAGNOSIS — R4182 Altered mental status, unspecified: Secondary | ICD-10-CM | POA: Diagnosis present

## 2017-09-29 DIAGNOSIS — R413 Other amnesia: Secondary | ICD-10-CM | POA: Diagnosis not present

## 2017-09-29 DIAGNOSIS — G459 Transient cerebral ischemic attack, unspecified: Secondary | ICD-10-CM | POA: Diagnosis not present

## 2017-09-29 LAB — COMPREHENSIVE METABOLIC PANEL
ALK PHOS: 40 U/L (ref 38–126)
ALT: 30 U/L (ref 14–54)
ANION GAP: 9 (ref 5–15)
AST: 34 U/L (ref 15–41)
Albumin: 4.1 g/dL (ref 3.5–5.0)
BUN: 14 mg/dL (ref 6–20)
CALCIUM: 9 mg/dL (ref 8.9–10.3)
CHLORIDE: 102 mmol/L (ref 101–111)
CO2: 26 mmol/L (ref 22–32)
CREATININE: 0.89 mg/dL (ref 0.44–1.00)
Glucose, Bld: 119 mg/dL — ABNORMAL HIGH (ref 65–99)
Potassium: 3.7 mmol/L (ref 3.5–5.1)
SODIUM: 137 mmol/L (ref 135–145)
Total Bilirubin: 0.6 mg/dL (ref 0.3–1.2)
Total Protein: 6.8 g/dL (ref 6.5–8.1)

## 2017-09-29 LAB — URINALYSIS, COMPLETE (UACMP) WITH MICROSCOPIC
Bacteria, UA: NONE SEEN
Bilirubin Urine: NEGATIVE
GLUCOSE, UA: NEGATIVE mg/dL
HGB URINE DIPSTICK: NEGATIVE
Ketones, ur: NEGATIVE mg/dL
Leukocytes, UA: NEGATIVE
NITRITE: NEGATIVE
PH: 7 (ref 5.0–8.0)
PROTEIN: NEGATIVE mg/dL
Specific Gravity, Urine: 1.004 — ABNORMAL LOW (ref 1.005–1.030)

## 2017-09-29 LAB — DIFFERENTIAL
BASOS PCT: 1 %
Basophils Absolute: 0 10*3/uL (ref 0–0.1)
EOS PCT: 2 %
Eosinophils Absolute: 0.1 10*3/uL (ref 0–0.7)
Lymphocytes Relative: 39 %
Lymphs Abs: 2 10*3/uL (ref 1.0–3.6)
MONO ABS: 0.5 10*3/uL (ref 0.2–0.9)
MONOS PCT: 9 %
NEUTROS ABS: 2.6 10*3/uL (ref 1.4–6.5)
Neutrophils Relative %: 49 %

## 2017-09-29 LAB — PROTIME-INR
INR: 2.07
PROTHROMBIN TIME: 23.1 s — AB (ref 11.4–15.2)

## 2017-09-29 LAB — CBC
HEMATOCRIT: 39.9 % (ref 35.0–47.0)
Hemoglobin: 13.8 g/dL (ref 12.0–16.0)
MCH: 33.7 pg (ref 26.0–34.0)
MCHC: 34.5 g/dL (ref 32.0–36.0)
MCV: 97.8 fL (ref 80.0–100.0)
PLATELETS: 243 10*3/uL (ref 150–440)
RBC: 4.08 MIL/uL (ref 3.80–5.20)
RDW: 13.2 % (ref 11.5–14.5)
WBC: 5.3 10*3/uL (ref 3.6–11.0)

## 2017-09-29 LAB — TROPONIN I

## 2017-09-29 LAB — APTT: aPTT: 29 seconds (ref 24–36)

## 2017-09-29 LAB — ETHANOL

## 2017-09-29 MED ORDER — ASPIRIN 81 MG PO CHEW
324.0000 mg | CHEWABLE_TABLET | Freq: Once | ORAL | Status: AC
  Administered 2017-09-30: 324 mg via ORAL
  Filled 2017-09-29: qty 4

## 2017-09-29 NOTE — ED Notes (Signed)
Patient transported to MRI 

## 2017-09-29 NOTE — Discharge Instructions (Addendum)
Please follow up with your primary care physician for further evaluation of your symptoms.  °

## 2017-09-29 NOTE — ED Triage Notes (Signed)
Pt states she has intermittent episodes of memory loss today. Husband states her behavior was normal throughout the day and kept asking repetitive questions this evening. No hx of the same, pt alert and awake at this time answering questions appropriately.

## 2017-09-29 NOTE — ED Provider Notes (Signed)
[facility] Emergency Department Provider Note  ____________________________________________  Time seen: Approximately 11:44 PM  I have reviewed the triage vital signs and the nursing notes.   HISTORY  Chief Complaint Altered Mental Status    HPI Carol [name] is a 76 y.o. female who reports that about 6 PM tonight she and her husband noticed that she was having memory loss. She participated in a full range of normal activities today including helping decorate her church for Christmas season, going to the bank to make a deposit, placing a catering order at [employer] and ordering lunch today, and even cooking dinner. However, at about 6:00, it was clear that she had no recollection whatsoever of the events of the morning at church or even of her son visiting for Thanksgiving. Denies headache or vision change or paresthesias or weakness. No chest pain or shortness of breath or palpitations. She is compliant with her Coumadin for her chronic A. fib.     Past Medical History:  Diagnosis Date  . Chronic sinusitis   . Fibrocystic breast disease   . History of pneumonia 1999  . Hyperlipidemia   . Irritable bowel syndrome   . Lichen planus   . 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 Mild tricuspid insufficiency Jan 2012   ECHO, [place]  . Moderate mitral insufficiency JAN 2012   ECHO, [place]     Patient Active Problem List   Diagnosis Date Noted  . Hepatic steatosis 07/23/2017  . Colicky epigastric pain 07/13/2017  . Hearing loss in left ear 07/13/2017  . Neck pain 03/28/2017  . Delayed postoperative wound closure 11/28/2016  . Encounter for preventive health examination 11/30/2015  . Long term current use of anticoagulant therapy 05/16/2015  . Screening for colon cancer 11/07/2014  . S/P TAH-BSO (total abdominal hysterectomy and bilateral salpingo-oophorectomy) 11/07/2014  . Breast cancer ([place]) 06/06/2014  . Medicare annual  wellness visit, subsequent 05/17/2014  . Lymphoma of lymph nodes of head, face, and/or neck ([place]) 08/15/2012  . Atrial fibrillation with rapid ventricular response ([place]) 08/15/2012  . Malignant neoplasm of orbit ([place]) 08/12/2012  . Abnormal chest CT 04/16/2012  . Lichen planus   . Irritable bowel syndrome   . Moderate mitral insufficiency   . Mild tricuspid insufficiency   . Hyperlipidemia      Past Surgical History:  Procedure Laterality Date  . ABDOMINAL HYSTERECTOMY     precancerous cervix,    . ABLATION OF DYSRHYTHMIC FOCUS  August 2013   [facility], Dr. Marcello Moores  . BREAST SURGERY     bilateral, benign biopsies  . MAXILLARY SINUS LIFT  1990's   [name]  . oophorectomy    . squamous cell removal Right    right leg calf area.     Prior to Admission medications   Medication Sig Start Date End Date Taking? Authorizing Provider  albuterol (PROVENTIL HFA;VENTOLIN HFA) 108 (90 BASE) MCG/ACT inhaler Inhale 2 puffs into the lungs every 6 (six) hours as needed. 05/15/15   Crecencio Mc, MD  baclofen (LIORESAL) 10 MG tablet TAKE 1/2 (ONE-HALF) TABLET BY MOUTH THREE TIMES DAILY 05/26/17   Leone Haven, MD  Bisacodyl (LAXATIVE PO) Take 1 tablet by mouth daily as needed. Reported on 11/28/2015    [provider]  diltiazem (CARDIZEM) 60 MG tablet Take 1 tablet (60 mg total) by mouth as needed. 11/05/13   Crecencio Mc, MD  diphenhydrAMINE (BENADRYL) 25 mg capsule Take 25 mg  by mouth every 6 (six) hours as needed.    [provider]  dofetilide (TIKOSYN) 250 MCG capsule Take 250 mcg by mouth 2 (two) times daily.    [provider]  furosemide (LASIX) 20 MG tablet TAKE 1 TABLET EVERY DAY 02/27/17   Crecencio Mc, MD  ibuprofen (ADVIL,MOTRIN) 200 MG tablet Take 200 mg by mouth every 6 (six) hours as needed.    [provider]  Magnesium 400 MG CAPS Take 1 capsule by mouth 2 (two) times daily. 11/05/13   Crecencio Mc, MD  metoprolol succinate  (TOPROL-XL) 50 MG 24 hr tablet Take 25 mg by mouth daily. 10/25/15   [provider]  tamoxifen (NOLVADEX) 20 MG tablet Take 20 mg by mouth daily.    [provider]  Tdap (BOOSTRIX) 5-2.5-18.5 LF-MCG/0.5 injection Inject 0.5 mLs into the muscle once. 11/07/14   Crecencio Mc, MD  warfarin (COUMADIN) 1 MG tablet Take 1 mg by mouth daily.    [provider]  warfarin (COUMADIN) 6 MG tablet Take 6 mg by mouth daily.    [provider]     Allergies Prednisone; Clonidine; Lotemax [loteprednol etabonate]; Morphine and related; Pulmicort [budesonide]; Trovan [alatrofloxacin mesylate]; Zelnorm [tegaserod maleate]; and Erythromycin   Family History  Problem Relation Age of Onset  . Cancer Mother        Breast  . Hyperlipidemia Father   . Heart disease Father   . Hypertension Father   . Kidney disease Father   . Cancer Maternal Grandfather        colon CA    Social History Social History   Tobacco Use  . Smoking status: Never Smoker  . Smokeless tobacco: Never Used  Substance Use Topics  . Alcohol use: Yes    Alcohol/week: 2.4 oz    Types: 4 Glasses of wine per week    Comment: Occ.  . Drug use: No    Review of Systems  Constitutional:   No fever or chills.  ENT:   No sore throat. No rhinorrhea. Cardiovascular:   No chest pain or syncope. Respiratory:   No dyspnea or cough. Gastrointestinal:   Negative for abdominal pain, vomiting and diarrhea.  Musculoskeletal:   Negative for focal pain or swelling All other systems reviewed and are negative except as documented above in ROS and HPI.  ____________________________________________   PHYSICAL EXAM:  VITAL SIGNS: ED Triage Vitals  Enc Vitals Group     BP 09/29/17 2158 (!) 151/100     Pulse Rate 09/29/17 2158 77     Resp 09/29/17 2158 18     Temp 09/29/17 2158 97.8 F (36.6 C)     Temp Source 09/29/17 2158 Oral     SpO2 09/29/17 2158 99 %     Weight 09/29/17 2159 165 lb (74.8 kg)      Height 09/29/17 2159 5' 6.5" (1.689 m)     Head Circumference --      Peak Flow --      Pain Score --      Pain Loc --      Pain Edu? --      Excl. in [place]? --     Vital signs reviewed, nursing assessments reviewed.   Constitutional:   Alert and oriented. Well appearing and in no distress. Eyes:   No scleral icterus.  EOMI. No nystagmus. No conjunctival pallor. PERRL. ENT   Head:   Normocephalic and atraumatic.   Nose:   No  congestion/rhinnorhea.    Mouth/Throat:   MMM, no pharyngeal erythema. No peritonsillar mass.    Neck:   No meningismus. Full ROM. Hematological/Lymphatic/Immunilogical:   No cervical lymphadenopathy. Cardiovascular:   RRR. Symmetric bilateral radial and DP pulses.  No murmurs.  Respiratory:   Normal respiratory effort without tachypnea/retractions. Breath sounds are clear and equal bilaterally. No wheezes/rales/rhonchi. Gastrointestinal:   Soft and nontender. Non distended. There is no CVA tenderness.  No rebound, rigidity, or guarding. Genitourinary:   deferred Musculoskeletal:   Normal range of motion in all extremities. No joint effusions.  No lower extremity tenderness.  No edema. Neurologic:   Normal speech and language.  No pronator drift. Normal finger to nose.  Motor grossly intact. NIH stroke scale 0 No gross focal neurologic deficits are appreciated.  Skin:    Skin is warm, dry and intact. No rash noted.  No petechiae, purpura, or bullae.  ____________________________________________    LABS (pertinent positives/negatives) (all labs ordered are listed, but only abnormal results are displayed) Labs Reviewed  PROTIME-INR - Abnormal; Notable for the following components:      Result Value   Prothrombin Time 23.1 (*)    All other components within normal limits  COMPREHENSIVE METABOLIC PANEL - Abnormal; Notable for the following components:   Glucose, Bld 119 (*)    All other components within normal limits  APTT  CBC   DIFFERENTIAL  TROPONIN I  ETHANOL  URINE DRUG SCREEN, QUALITATIVE ([facility] ONLY)  URINALYSIS, COMPLETE ([facility]MP) WITH MICROSCOPIC   ____________________________________________   EKG  Interpreted by me Sinus rhythm rate of 74, normal axis intervals QRS ST segments and T waves  ____________________________________________    RADIOLOGY  Ct Head Code Stroke Wo Contrast  Result Date: 09/29/2017 CLINICAL DATA:  Code stroke. 76 y/o F; intermittent episodes of memory loss. EXAM: CT HEAD WITHOUT CONTRAST TECHNIQUE: Contiguous axial images were obtained from the base of the skull through the vertex without intravenous contrast. COMPARISON:  None. FINDINGS: Brain: No evidence of acute infarction, hemorrhage, hydrocephalus, extra-axial collection or mass lesion/mass effect. Nonspecific foci of hypoattenuation in subcortical and periventricular white matter compatible with mild chronic microvascular ischemic changes. Mild brain parenchymal volume loss. Vascular: Calcific atherosclerosis of the carotid siphons. No hyperdense vessel. Skull: Normal. Negative for fracture or focal lesion. Sinuses/Orbits: Partially visualized maxillary antrostomy and partial ethmoidectomy changes. Visualized paranasal sinuses and mastoid air cells are normally aerated. Other: None. ASPECTS [facility] Stroke Program Early CT Score) - Ganglionic level infarction (caudate, lentiform nuclei, internal capsule, insula, M1-M3 cortex): 7 - Supraganglionic infarction (M4-M6 cortex): 3 Total score (0-10 with 10 being normal): 10 IMPRESSION: 1. No acute intracranial abnormality identified. 2. Mild for age chronic microvascular ischemic changes and mild parenchymal volume loss of the brain. 3. ASPECTS is 10 These results were called by telephone at the time of interpretation on 09/29/2017 at 11:00 pm to Dr. Kerman Passey, who verbally acknowledged these results. [place] Electronically Signed   By: Kristine Garbe M.D.   On:  09/29/2017 23:02    ____________________________________________   PROCEDURES Procedures  ____________________________________________   DIFFERENTIAL DIAGNOSIS  stroke, TIA, subdural hematoma, epidural hematoma, fatigue  CLINICAL IMPRESSION / ASSESSMENT AND PLAN / ED COURSE  Pertinent labs & imaging results that were available during my care of the patient were reviewed by me and considered in my medical decision making (see chart for details).   Patient presents with symptoms of transient global amnesia. Stroke scale currently 0, symptoms resolved, consistent with TIA. She is already on  anticoagulation with warfarin. Initiate stroke workup with CT head, if negative then MRI brain. If negative, patient will be suitable for discharge home with her outpatient follow-up to primary care and neurology for further risk factor evaluation.  Clinical Course as of Sep 30 2343  Mon Sep 29, 2017  2316 CT head neg. With resolution of sx, will perform MRI. If NAD, pt can follow outpt with pcp for further stroke risk eval.   [PS]    Clinical Course User Index [PS] Carrie Mew, MD    ----------------------------------------- 11:47 PM on 09/29/2017 -----------------------------------------  MRI pending. Oral aspirin ordered. Care of the signed out to Dr. Dahlia Client to follow up on MRI for disposition.   ____________________________________________   FINAL CLINICAL IMPRESSION(S) / ED DIAGNOSES    Final diagnoses:  Transient global amnesia  TIA (transient ischemic attack)      This SmartLink is deprecated. Use AVSMEDLIST instead to display the medication list for a patient.   Portions of this note were generated with dragon dictation software. Dictation errors may occur despite best attempts at proofreading.    Carrie Mew, MD 09/29/17 2351

## 2017-09-30 ENCOUNTER — Other Ambulatory Visit: Payer: Self-pay | Admitting: Internal Medicine

## 2017-09-30 ENCOUNTER — Telehealth: Payer: Self-pay

## 2017-09-30 DIAGNOSIS — R413 Other amnesia: Secondary | ICD-10-CM | POA: Diagnosis not present

## 2017-09-30 LAB — URINE DRUG SCREEN, QUALITATIVE (ARMC ONLY)
Amphetamines, Ur Screen: NOT DETECTED
Barbiturates, Ur Screen: NOT DETECTED
Benzodiazepine, Ur Scrn: NOT DETECTED
Cannabinoid 50 Ng, Ur ~~LOC~~: NOT DETECTED
Cocaine Metabolite,Ur ~~LOC~~: NOT DETECTED
MDMA (Ecstasy)Ur Screen: NOT DETECTED
Methadone Scn, Ur: NOT DETECTED
Opiate, Ur Screen: NOT DETECTED
Phencyclidine (PCP) Ur S: NOT DETECTED
Tricyclic, Ur Screen: NOT DETECTED

## 2017-09-30 NOTE — ED Provider Notes (Signed)
-----------------------------------------   12:56 AM on 09/30/2017 -----------------------------------------   Blood pressure (!) 164/103, pulse 72, temperature 98.1 F (36.7 C), resp. rate 20, height 5' 6.5" (1.689 m), weight 74.8 kg (165 lb), SpO2 98 %.  Assuming care from Dr. Joni Fears.  In short, Carol [name] is a 76 y.o. female with a chief complaint of Altered Mental Status .  Refer to the original H&P for additional details.  The current plan of care is to follow up the results of the MRI.   Clinical Course as of Oct 01 55  Mon Sep 29, 2017  2316 CT head neg. With resolution of sx, will perform MRI. If NAD, pt can follow outpt with pcp for further stroke risk eval.   [PS]  Tue Sep 30, 2017  0055 Chronic ischemic microangiopathy without acute abnormality. MR Brain Wo Contrast [AW]    Clinical Course User Index [AW] Loney Hering, MD [PS] Carrie Mew, MD   The patient's MRI shows some chronic ischemic microangiopathy without any acute abnormalities.  I am still unsure as to what caused the patient's memory loss earlier today but she should follow-up with her primary care physician.  She will be discharged home.   Loney Hering, MD 09/30/17 [phone]

## 2017-09-30 NOTE — Telephone Encounter (Signed)
Needs 4:30 slot

## 2017-09-30 NOTE — Telephone Encounter (Signed)
Would you like for me to schedule patient in a 4:30 slot or can he wait until 12/18 to be seen by you?  Copied from [place] [phone]. Topic: Appointment Scheduling - Scheduling Inquiry for Clinic >> Sep 30, 2017  1:25 PM Boyd Kerbs wrote: Reason for CRM: Pt. Went to ED yesterday for a TIA, was told to follow up appt with Doctor.  She has appt Dec. 18th and next appt I see is past that.  Not sure if can wait until the 18th.  Question if can wait

## 2017-10-01 NOTE — Telephone Encounter (Signed)
Patient scheduled.

## 2017-10-01 NOTE — Telephone Encounter (Signed)
LMTCB

## 2017-10-07 ENCOUNTER — Encounter: Payer: Self-pay | Admitting: Internal Medicine

## 2017-10-07 ENCOUNTER — Ambulatory Visit (INDEPENDENT_AMBULATORY_CARE_PROVIDER_SITE_OTHER): Payer: Medicare Other | Admitting: Internal Medicine

## 2017-10-07 VITALS — BP 124/76 | HR 70 | Temp 97.9°F | Resp 15 | Ht 66.5 in | Wt 172.0 lb

## 2017-10-07 DIAGNOSIS — G459 Transient cerebral ischemic attack, unspecified: Secondary | ICD-10-CM

## 2017-10-07 DIAGNOSIS — J0100 Acute maxillary sinusitis, unspecified: Secondary | ICD-10-CM

## 2017-10-07 DIAGNOSIS — G454 Transient global amnesia: Secondary | ICD-10-CM

## 2017-10-07 MED ORDER — PREDNISONE 20 MG PO TABS: 20.0000 mg | ORAL_TABLET | Freq: Every day | ORAL | 0 refills | Status: DC

## 2017-10-07 MED ORDER — AMOXICILLIN-POT CLAVULANATE 875-125 MG PO TABS: 1.0000 | ORAL_TABLET | Freq: Two times a day (BID) | ORAL | 0 refills | Status: DC

## 2017-10-07 NOTE — Progress Notes (Signed)
Subjective:  Patient ID: Carol [name], female    DOB: 1941/10/03  Age: 76 y.o. MRN: [id number]  CC: The primary encounter diagnosis was TIA (transient ischemic attack). Diagnoses of Transient global amnesia and Acute non-recurrent maxillary sinusitis were also pertinent to this visit.  HPI Carol [name] presents for ER follow up  For  An episode of transient global amnesia.  Events of the episode were reviewed in detail with patient along with the ER records.  To summarize,  On Nov 26 patient developed an inability to recall specific events that had happened earlier in the day as well as a recent visit from her son.  She had had an event filled morning and afternoon, but denies any exceptional physical or emotional stress leading up to the event.  The epsiode was not accompanied by headache, confusion,  dizziness or vision changes . She was treated as a CODE STROKE and sent to CT immediately,  Followed by an MRI.  Both were negative for acute changes,  But chronic ischemic changes were noted.  INR was therapeutic on coumadin and EKG and troponin were both normal. She was discharged home with advice to see neurologist Dr Manuella Ghazi, but no referral was made.    2) URI symptoms started on 5 days ago with sinus pressure and drainage.  Has been using OTC  Medications to relieve drainage and congestion but symptoms have persisted and she now reports persistent pain in her left middle ear.  No fevers,  Cough is productive.    Outpatient Medications Prior to Visit  Medication Sig Dispense Refill  . albuterol (PROVENTIL HFA;VENTOLIN HFA) 108 (90 BASE) MCG/ACT inhaler Inhale 2 puffs into the lungs every 6 (six) hours as needed. 18 g 0  . baclofen (LIORESAL) 10 MG tablet TAKE 1/2 (ONE-HALF) TABLET BY MOUTH THREE TIMES DAILY 10 tablet 0  . Bisacodyl (LAXATIVE PO) Take 1 tablet by mouth daily as needed. Reported on 11/28/2015    . diltiazem (CARDIZEM) 60 MG tablet Take 1 tablet (60 mg total)  by mouth as needed. 90 tablet 1  . diphenhydrAMINE (BENADRYL) 25 mg capsule Take 25 mg by mouth every 6 (six) hours as needed.    . dofetilide (TIKOSYN) 250 MCG capsule Take 250 mcg by mouth 2 (two) times daily.    . furosemide (LASIX) 20 MG tablet TAKE 1 TABLET EVERY DAY 90 tablet 1  . ibuprofen (ADVIL,MOTRIN) 200 MG tablet Take 200 mg by mouth every 6 (six) hours as needed.    . Magnesium 400 MG CAPS Take 1 capsule by mouth 2 (two) times daily. 180 capsule 3  . metoprolol succinate (TOPROL-XL) 50 MG 24 hr tablet Take 25 mg by mouth daily.    . tamoxifen (NOLVADEX) 20 MG tablet Take 20 mg by mouth daily.    . Tdap (BOOSTRIX) 5-2.5-18.5 LF-MCG/0.5 injection Inject 0.5 mLs into the muscle once. 0.5 mL 0  . warfarin (COUMADIN) 1 MG tablet Take 1 mg by mouth daily.    Marland Kitchen warfarin (COUMADIN) 6 MG tablet Take 6 mg by mouth daily.     No facility-administered medications prior to visit.     Review of Systems;  Patient denies headache, fevers, malaise, unintentional weight loss, skin rash, eye pain, , sore throat, dysphagia,  hemoptysis ,, dyspnea, wheezing, chest pain, palpitations, orthopnea, edema, abdominal pain, nausea, melena, diarrhea, constipation, flank pain, dysuria, hematuria, urinary  Frequency, nocturia, numbness, tingling, seizures,  Focal weakness, Loss of consciousness,  Tremor, insomnia, depression, anxiety, and  suicidal ideation.      Objective:  BP 124/76 (BP Location: Left Arm, Patient Position: Sitting, Cuff Size: Normal)   Pulse 70   Temp 97.9 F (36.6 C) (Oral)   Resp 15   Ht 5' 6.5" (1.689 m)   Wt 172 lb (78 kg)   SpO2 99%   BMI 27.35 kg/m   BP Readings from Last 3 Encounters:  10/07/17 124/76  09/30/17 (!) 160/77  07/11/17 126/80    Wt Readings from Last 3 Encounters:  10/07/17 172 lb (78 kg)  09/29/17 165 lb (74.8 kg)  07/11/17 176 lb 12.8 oz (80.2 kg)    General appearance: alert, cooperative and appears stated age Ears:left TM injected and bulging ,  right TM normal   normal ear canals both ears Face: bilateral maxillary sinus tenderness to palpation  Throat: lips, mucosa, and tongue normal; teeth and gums normal Neck: no adenopathy, no carotid bruit, supple, symmetrical, trachea midline and thyroid not enlarged, symmetric, no tenderness/mass/nodules Back: symmetric, no curvature. ROM normal. No CVA tenderness. Lungs: clear to auscultation bilaterally Heart: regular rate and rhythm, S1, S2 normal, no murmur, click, rub or gallop Abdomen: soft, non-tender; bowel sounds normal; no masses,  no organomegaly Pulses: 2+ and symmetric Skin: Skin color, texture, turgor normal. No rashes or lesions Lymph nodes: Cervical, supraclavicular, and axillary nodes normal. Neuro:  awake and interactive with normal mood and affect. Higher cortical functions are normal. Speech is clear without word-finding difficulty or dysarthria. Extraocular movements are intact. Visual fields of both eyes are grossly intact. Sensation to light touch is grossly intact bilaterally of upper and lower extremities. Motor examination shows 4+/5 symmetric hand grip and upper extremity and 5/5 lower extremity strength. There is no pronation or drift. Gait is non-ataxic    Lab Results  Component Value Date   HGBA1C 5.7 05/27/2016    Lab Results  Component Value Date   CREATININE 0.89 09/29/2017   CREATININE 0.94 07/11/2017   CREATININE 0.88 11/27/2016    Lab Results  Component Value Date   WBC 5.3 09/29/2017   HGB 13.8 09/29/2017   HCT 39.9 09/29/2017   PLT 243 09/29/2017   GLUCOSE 119 (H) 09/29/2017   CHOL 201 (H) 07/11/2017   TRIG 216.0 (H) 07/11/2017   HDL 52.10 07/11/2017   LDLDIRECT 117.0 07/11/2017   LDLCALC 125 (H) 11/27/2016   ALT 30 09/29/2017   AST 34 09/29/2017   NA 137 09/29/2017   K 3.7 09/29/2017   CL 102 09/29/2017   CREATININE 0.89 09/29/2017   BUN 14 09/29/2017   CO2 26 09/29/2017   TSH 1.26 07/11/2017   INR 2.07 09/29/2017   HGBA1C 5.7  05/27/2016    Mr Brain Wo Contrast  Result Date: 09/30/2017 CLINICAL DATA:  Sudden onset memory loss EXAM: MRI HEAD WITHOUT CONTRAST TECHNIQUE: Multiplanar, multiecho pulse sequences of the brain and surrounding structures were obtained without intravenous contrast. COMPARISON:  Head CT 09/29/2017 FINDINGS: Brain: The midline structures are normal. There is no acute infarct or acute hemorrhage. No mass lesion, hydrocephalus, dural abnormality or extra-axial collection. There is multifocal white matter hyperintensity suggesting chronic ischemic microangiopathy. T2-weighted signal hyperintensity within the pons may be a sequela of remote ischemia. No age-advanced or lobar predominant atrophy. No chronic microhemorrhage or superficial siderosis. Vascular: Major intracranial arterial and venous sinus flow voids are preserved. Skull and upper cervical spine: The visualized skull base, calvarium, upper cervical spine and extracranial soft tissues are normal. Sinuses/Orbits: No fluid levels or advanced mucosal  thickening. No mastoid or middle ear effusion. Normal orbits. IMPRESSION: Chronic ischemic microangiopathy without acute abnormality. Electronically Signed   By: Ulyses Jarred M.D.   On: 09/30/2017 00:46   Ct Head Code Stroke Wo Contrast  Result Date: 09/29/2017 CLINICAL DATA:  Code stroke. 76 y/o F; intermittent episodes of memory loss. EXAM: CT HEAD WITHOUT CONTRAST TECHNIQUE: Contiguous axial images were obtained from the base of the skull through the vertex without intravenous contrast. COMPARISON:  None. FINDINGS: Brain: No evidence of acute infarction, hemorrhage, hydrocephalus, extra-axial collection or mass lesion/mass effect. Nonspecific foci of hypoattenuation in subcortical and periventricular white matter compatible with mild chronic microvascular ischemic changes. Mild brain parenchymal volume loss. Vascular: Calcific atherosclerosis of the carotid siphons. No hyperdense vessel. Skull: Normal.  Negative for fracture or focal lesion. Sinuses/Orbits: Partially visualized maxillary antrostomy and partial ethmoidectomy changes. Visualized paranasal sinuses and mastoid air cells are normally aerated. Other: None. ASPECTS [facility] Stroke Program Early CT Score) - Ganglionic level infarction (caudate, lentiform nuclei, internal capsule, insula, M1-M3 cortex): 7 - Supraganglionic infarction (M4-M6 cortex): 3 Total score (0-10 with 10 being normal): 10 IMPRESSION: 1. No acute intracranial abnormality identified. 2. Mild for age chronic microvascular ischemic changes and mild parenchymal volume loss of the brain. 3. ASPECTS is 10 These results were called by telephone at the time of interpretation on 09/29/2017 at 11:00 pm to Dr. Kerman Passey, who verbally acknowledged these results. [place] Electronically Signed   By: Kristine Garbe M.D.   On: 09/29/2017 23:02    Assessment & Plan:   Problem List Items Addressed This Visit    Sinusitis, acute maxillary    Now with otitis medial on left.  Augmentin and prednisone prescribed .  Use of probiotic advised.       Relevant Medications   amoxicillin-clavulanate (AUGMENTIN) 875-125 MG tablet   predniSONE (DELTASONE) 20 MG tablet   Transient global amnesia    Occurred on Nov 26,  With memory now restored.  MRI no acute changes,  INR therapeutic.    Carotid US ordered,  And Neurology referral for EEG       Relevant Orders   Ambulatory referral to Neurology    Other Visit Diagnoses    TIA (transient ischemic attack)    -  Primary   Relevant Orders   US Carotid Duplex Bilateral    A total of 40 minutes was spent with patient more than half of which was spent in counseling patient on the above mentioned issues , reviewing and explaining recent labs and imaging studies done, and coordination of care.   I am having Geryl Councilman. Hungate start on amoxicillin-clavulanate and predniSONE. I am also having her maintain her warfarin, diphenhydrAMINE,  Bisacodyl (LAXATIVE PO), ibuprofen, dofetilide, diltiazem, Magnesium, tamoxifen, Tdap, albuterol, warfarin, metoprolol succinate, baclofen, and furosemide.  Meds ordered this encounter  Medications  . amoxicillin-clavulanate (AUGMENTIN) 875-125 MG tablet    Sig: Take 1 tablet by mouth 2 (two) times daily.    Dispense:  14 tablet    Refill:  0  . predniSONE (DELTASONE) 20 MG tablet    Sig: Take 1 tablet (20 mg total) by mouth daily with breakfast.    Dispense:  5 tablet    Refill:  0    There are no discontinued medications.  Follow-up: No Follow-up on file.   Crecencio Mc, MD

## 2017-10-07 NOTE — Patient Instructions (Addendum)
I have ordered a carotid ultrasound to rule out stenosis   I have made a referral to Dr Vickii Penna (neurology)  I am treating your for otitis media of the left ear with augmentin and prednisone.   Transient Global Amnesia Transient global amnesia causes a sudden and temporary (transient) loss of memory (amnesia). While you may recall memories from your distant past, including being able to recognize people you know well, you may not recall things that happened more recently in the past days, months, or even year. A transient global amnesia episode does not last longer than 24 hours. Transient global amnesia does not affect your other brain functions. Your memory usually returns to normal after an episode is over. One episode of transient global amnesia does not make you more likely to have a stroke, a relapse, or other complications. What are the causes? The cause of this condition is not known. What increases the risk? Transient global amnesia is more likely to develop in people who:  Are 84-67 years old.  Have a history of migraine headaches.  What are the signs or symptoms? The main symptoms of this condition include:  The inability to remember recent events.  Asking repetitive questions about the situation and surroundings and not recalling the answers to these questions.  Other symptoms include:  Restlessness and nervousness.  Confusion.  Headaches.  Dizziness.  Nausea.  How is this diagnosed? Your health care provider may suspect transient global amnesia based on your symptoms. Your health care provider will do a physical exam. This may include a test to check your mental abilities (cognitive evaluation). You may also have imaging studies done to check your brain function. These may include:  Electroencephalography (EEG).  Diffusion-weighted imaging (DWI).  MRI.  How is this treated? There is no treatment for this condition. An episode typically goes away on  its own after a few hours. If you also have a seizure or migraine during an episode, you will receive treatment for these conditions. This may include medicines. Follow these instructions at home:  Take medicines only as directed by your health care provider.  Tell your family or friends that you have transient global amnesia. Ask them to help you avoid physical exertion, including sexual intercourse, swimming, and straining while holding your breath (Valsalva maneuver), until the episode passes. These are events that can bring on transient global amnesia attacks. Contact a health care provider if:  You have a migraine and it does not go away after you have followed your treatment plan for this condition.  You have a seizure for the first time, or a seizure that is different from seizures you normally have.  You experience transient global amnesia repeatedly. This information is not intended to replace advice given to you by your health care provider. Make sure you discuss any questions you have with your health care provider. Document Released: 11/28/2004 Document Revised: 06/24/2016 Document Reviewed: 07/06/2014 Elsevier Interactive Patient Education  [employer].

## 2017-10-08 DIAGNOSIS — G454 Transient global amnesia: Secondary | ICD-10-CM | POA: Insufficient documentation

## 2017-10-08 DIAGNOSIS — J0100 Acute maxillary sinusitis, unspecified: Secondary | ICD-10-CM | POA: Insufficient documentation

## 2017-10-08 NOTE — Assessment & Plan Note (Signed)
Occurred on Nov 26,  With memory now restored.  MRI no acute changes,  INR therapeutic.    Carotid US ordered,  And Neurology referral for EEG

## 2017-10-08 NOTE — Assessment & Plan Note (Signed)
Now with otitis medial on left.  Augmentin and prednisone prescribed .  Use of probiotic advised.

## 2017-10-16 DIAGNOSIS — Z79899 Other long term (current) drug therapy: Secondary | ICD-10-CM | POA: Diagnosis not present

## 2017-10-16 DIAGNOSIS — I480 Paroxysmal atrial fibrillation: Secondary | ICD-10-CM | POA: Diagnosis not present

## 2017-10-16 DIAGNOSIS — Z5181 Encounter for therapeutic drug level monitoring: Secondary | ICD-10-CM | POA: Diagnosis not present

## 2017-10-16 DIAGNOSIS — I10 Essential (primary) hypertension: Secondary | ICD-10-CM | POA: Diagnosis not present

## 2017-10-16 DIAGNOSIS — I493 Ventricular premature depolarization: Secondary | ICD-10-CM | POA: Diagnosis not present

## 2017-10-16 DIAGNOSIS — I341 Nonrheumatic mitral (valve) prolapse: Secondary | ICD-10-CM | POA: Diagnosis not present

## 2017-10-16 DIAGNOSIS — E782 Mixed hyperlipidemia: Secondary | ICD-10-CM | POA: Diagnosis not present

## 2017-10-21 ENCOUNTER — Ambulatory Visit: Payer: No Typology Code available for payment source | Admitting: Internal Medicine

## 2017-10-22 ENCOUNTER — Ambulatory Visit: Payer: Medicare Other

## 2017-10-23 ENCOUNTER — Ambulatory Visit
Admission: RE | Admit: 2017-10-23 | Discharge: 2017-10-23 | Disposition: A | Discharge status: Home / Self Care | Payer: Medicare Other | Source: Ambulatory Visit | Attending: Internal Medicine | Admitting: Internal Medicine

## 2017-10-23 DIAGNOSIS — R791 Abnormal coagulation profile: Secondary | ICD-10-CM | POA: Diagnosis not present

## 2017-10-23 DIAGNOSIS — I6523 Occlusion and stenosis of bilateral carotid arteries: Secondary | ICD-10-CM | POA: Diagnosis not present

## 2017-10-23 DIAGNOSIS — G459 Transient cerebral ischemic attack, unspecified: Secondary | ICD-10-CM | POA: Insufficient documentation

## 2017-11-19 DIAGNOSIS — I480 Paroxysmal atrial fibrillation: Secondary | ICD-10-CM | POA: Diagnosis not present

## 2017-11-19 DIAGNOSIS — F99 Mental disorder, not otherwise specified: Secondary | ICD-10-CM | POA: Diagnosis not present

## 2017-11-19 DIAGNOSIS — G454 Transient global amnesia: Secondary | ICD-10-CM | POA: Diagnosis not present

## 2017-11-25 DIAGNOSIS — L538 Other specified erythematous conditions: Secondary | ICD-10-CM | POA: Diagnosis not present

## 2017-11-25 DIAGNOSIS — Z85828 Personal history of other malignant neoplasm of skin: Secondary | ICD-10-CM | POA: Diagnosis not present

## 2017-11-25 DIAGNOSIS — D2261 Melanocytic nevi of right upper limb, including shoulder: Secondary | ICD-10-CM | POA: Diagnosis not present

## 2017-11-25 DIAGNOSIS — C4441 Basal cell carcinoma of skin of scalp and neck: Secondary | ICD-10-CM | POA: Diagnosis not present

## 2017-11-25 DIAGNOSIS — L820 Inflamed seborrheic keratosis: Secondary | ICD-10-CM | POA: Diagnosis not present

## 2017-11-25 DIAGNOSIS — D485 Neoplasm of uncertain behavior of skin: Secondary | ICD-10-CM | POA: Diagnosis not present

## 2017-11-25 DIAGNOSIS — C44519 Basal cell carcinoma of skin of other part of trunk: Secondary | ICD-10-CM | POA: Diagnosis not present

## 2017-11-25 DIAGNOSIS — D2271 Melanocytic nevi of right lower limb, including hip: Secondary | ICD-10-CM | POA: Diagnosis not present

## 2017-11-25 DIAGNOSIS — D225 Melanocytic nevi of trunk: Secondary | ICD-10-CM | POA: Diagnosis not present

## 2017-12-04 ENCOUNTER — Other Ambulatory Visit: Payer: Self-pay | Admitting: Internal Medicine

## 2017-12-04 NOTE — Telephone Encounter (Signed)
Pt brought in her new insurance card with [employer]. She states she needs refills on all her medications. Could not tell me which ones. New insurance/RX  card has been scanned. These medications are all mail order.

## 2017-12-09 ENCOUNTER — Other Ambulatory Visit: Payer: Self-pay | Admitting: *Deleted

## 2017-12-18 DIAGNOSIS — I480 Paroxysmal atrial fibrillation: Secondary | ICD-10-CM | POA: Diagnosis not present

## 2017-12-18 DIAGNOSIS — G454 Transient global amnesia: Secondary | ICD-10-CM | POA: Diagnosis not present

## 2017-12-18 NOTE — Telephone Encounter (Signed)
Please advise 

## 2017-12-18 NOTE — Telephone Encounter (Signed)
Patient states she needs furosemide (LASIX) 20 MG tablet.

## 2017-12-18 NOTE — Telephone Encounter (Signed)
Patient has one 90 day refill left on bottle at Mail order pharmacy does patient need 30 day supply at local until mail order arrives or would patient like 90 day sent to local pharmacy? Left message for patient to return call  To office [facility] may advise patient .

## 2017-12-18 NOTE — Telephone Encounter (Signed)
LMTCB. Need to clarify which medications she needs refilled. [facility] may speak with pt.

## 2017-12-19 MED ORDER — FUROSEMIDE 20 MG PO TABS: 20.0000 mg | ORAL_TABLET | Freq: Every day | ORAL | 1 refills | Status: DC

## 2017-12-19 NOTE — Telephone Encounter (Signed)
rx has been sent to [facility].

## 2017-12-19 NOTE — Telephone Encounter (Signed)
Copied from [place]. Topic: Quick Communication - Rx Refill/Question >> Dec 19, 2017 11:34 AM Carolyn Stare wrote: Medication  furosemide (LASIX) 20 MG tablet  Has the patient contacted their pharmacy     No     Pt changed pharmacy     Preferred Pharmacy  [facility]   Agent: Please be advised that RX refills may take up to 3 business days. We ask that you follow-up with your pharmacy.

## 2017-12-19 NOTE — Addendum Note (Signed)
Addended by: Adair Laundry on: 12/19/2017 05:18 PM   Modules accepted: Orders

## 2017-12-19 NOTE — Telephone Encounter (Signed)
Patient called and clarified she only needs the furosemide sent to [facility], she doesn't need a 30 day supply to a local pharmacy. I advised the prescription will be sent.

## 2017-12-29 ENCOUNTER — Ambulatory Visit: Payer: No Typology Code available for payment source

## 2018-01-01 DIAGNOSIS — C44519 Basal cell carcinoma of skin of other part of trunk: Secondary | ICD-10-CM | POA: Diagnosis not present

## 2018-01-01 DIAGNOSIS — L905 Scar conditions and fibrosis of skin: Secondary | ICD-10-CM | POA: Diagnosis not present

## 2018-01-08 ENCOUNTER — Ambulatory Visit (INDEPENDENT_AMBULATORY_CARE_PROVIDER_SITE_OTHER): Payer: Medicare Other | Admitting: Internal Medicine

## 2018-01-08 ENCOUNTER — Encounter: Payer: Self-pay | Admitting: Internal Medicine

## 2018-01-08 DIAGNOSIS — Z789 Other specified health status: Secondary | ICD-10-CM | POA: Insufficient documentation

## 2018-01-08 DIAGNOSIS — Z0000 Encounter for general adult medical examination without abnormal findings: Secondary | ICD-10-CM

## 2018-01-08 DIAGNOSIS — E7800 Pure hypercholesterolemia, unspecified: Secondary | ICD-10-CM

## 2018-01-08 DIAGNOSIS — I480 Paroxysmal atrial fibrillation: Secondary | ICD-10-CM | POA: Diagnosis not present

## 2018-01-08 NOTE — Assessment & Plan Note (Addendum)
Untreated secondary to Statin intolerance,

## 2018-01-08 NOTE — Progress Notes (Signed)
Patient ID: Carol [name], female    DOB: [birth year]  Age: 77 y.o. MRN: [id number]  The patient is here for follow up and management of other chronic and acute problems.   The risk factors are reflected in the social history.  The roster of all physicians providing medical care to patient - is listed in the Snapshot section of the chart.  Activities of daily living:  The patient is 100% independent in all ADLs: dressing, toileting, feeding as well as independent mobility  Home safety : The patient has smoke detectors in the home. They wear seatbelts.  There are no firearms at home. There is no violence in the home.   There is no risks for hepatitis, STDs or HIV. There is no   history of blood transfusion. They have no travel history to infectious disease endemic areas of the world.  The patient has seen their dentist in the last six month. They have seen their eye doctor in the last year. They admit to slight hearing difficulty with regard to whispered voices and some television programs.  They have deferred audiologic testing in the last year.  They do not  have excessive sun exposure. Discussed the need for sun protection: hats, long sleeves and use of sunscreen if there is significant sun exposure.   Diet: the importance of a healthy diet is discussed. They do have a healthy diet.  The benefits of regular aerobic exercise were discussed. She walks 4 times per week ,  20 minutes.   Depression screen: there are no signs or vegative symptoms of depression- irritability, change in appetite, anhedonia, sadness/tearfullness.  Cognitive assessment: the patient manages all their financial and personal affairs and is actively engaged. They could relate day,date,year and events; recalled 2/3 objects at 3 minutes; performed clock-face test normally.  The following portions of the patient's history were reviewed and updated as appropriate: allergies, current medications, past family history,  past medical history,  past surgical history, past social history  and problem list.  Visual acuity was not assessed per patient preference since she has regular follow up with her ophthalmologist. Hearing and body mass index were assessed and reviewed.   During the course of the visit the patient was educated and counseled about appropriate screening and preventive services including : fall prevention , diabetes screening, nutrition counseling, colorectal cancer screening, and recommended immunizations.    CC: Diagnoses of Statin intolerance, Pure hypercholesterolemia, and Encounter for preventive health examination were pertinent to this visit. TGA: had EEG by Manuella Ghazi on Feb 14 that was negative for seizure activity .  Has follow up next week with Neuro   Sees kowalski for atrial fib managed with tikosyn   Taking tamoxifen for 3.5 years.  Lots of symptoms but committed to finishing  the course.   T scores normal sept 2018 DEXA .  Exercising regularly.  Craves carbohydrates.   Feeling good.  Had a Squamous cell CA on scalp diagnosed,   Has been [facility] to [facility] for [facility]' s procedure  TGA: had EEG by Manuella Ghazi on Feb 14 that was negative for seizure activity .  Has follow up next week with Neuro   Sees kowalski for atrial fib managed with tikosyn   Taking tamoxifen for 3.5 years.  Lots of symptoms but committed to finishing  the course.   T scores normal sept 2018 DEXA .  Exercising regularly.  Craves carbohydrates.   Feeling good.  Had a Squamous cell CA on scalp diagnosed,  Has been referred to [facility] for [facility]' s procedure   [name] has a past medical history of Chronic sinusitis, Fibrocystic breast disease, History of pneumonia (1999), Hyperlipidemia, Irritable bowel syndrome, Lichen planus, lymphoma (August 2013), Mild tricuspid insufficiency (Jan 2012), and Moderate mitral insufficiency (JAN 2012).   She has a past surgical history that includes Maxillary sinus lift (1990's);  oophorectomy; Breast surgery; Abdominal hysterectomy; Ablation of dysrhythmic focus (August 2013); and squamous cell removal (Right).   Her family history includes Cancer in her maternal grandfather and mother; Heart disease in her father; Hyperlipidemia in her father; Hypertension in her father; Kidney disease in her father.She reports that  has never smoked. she has never used smokeless tobacco. She reports that she drinks about 2.4 oz of alcohol per week. She reports that she does not use drugs.  Outpatient Medications Prior to Visit  Medication Sig Dispense Refill  . albuterol (PROVENTIL HFA;VENTOLIN HFA) 108 (90 BASE) MCG/ACT inhaler Inhale 2 puffs into the lungs every 6 (six) hours as needed. 18 g 0  . Bisacodyl (LAXATIVE PO) Take 1 tablet by mouth daily as needed. Reported on 11/28/2015    . diltiazem (CARDIZEM) 60 MG tablet Take 1 tablet (60 mg total) by mouth as needed. 90 tablet 1  . diphenhydrAMINE (BENADRYL) 25 mg capsule Take 25 mg by mouth every 6 (six) hours as needed.    . dofetilide (TIKOSYN) 250 MCG capsule Take 250 mcg by mouth 2 (two) times daily.    . furosemide (LASIX) 20 MG tablet Take 1 tablet (20 mg total) by mouth daily. 90 tablet 1  . ibuprofen (ADVIL,MOTRIN) 200 MG tablet Take 200 mg by mouth every 6 (six) hours as needed.    . Magnesium 400 MG CAPS Take 1 capsule by mouth 2 (two) times daily. 180 capsule 3  . metoprolol succinate (TOPROL-XL) 50 MG 24 hr tablet Take 25 mg by mouth daily.    . tamoxifen (NOLVADEX) 20 MG tablet Take 20 mg by mouth daily.    . Tdap (BOOSTRIX) 5-2.5-18.5 LF-MCG/0.5 injection Inject 0.5 mLs into the muscle once. 0.5 mL 0  . warfarin (COUMADIN) 1 MG tablet Take 1 mg by mouth daily.    Marland Kitchen warfarin (COUMADIN) 6 MG tablet Take 6 mg by mouth daily.    . baclofen (LIORESAL) 10 MG tablet TAKE 1/2 (ONE-HALF) TABLET BY MOUTH THREE TIMES DAILY (Patient not taking: Reported on 01/08/2018) 10 tablet 0   No facility-administered medications prior to visit.      Review of Systems   Patient denies headache, fevers, malaise, unintentional weight loss, skin rash, eye pain, sinus congestion and sinus pain, sore throat, dysphagia,  hemoptysis , cough, dyspnea, wheezing, chest pain, palpitations, orthopnea, edema, abdominal pain, nausea, melena, diarrhea, constipation, flank pain, dysuria, hematuria, urinary  Frequency, nocturia, numbness, tingling, seizures,  Focal weakness, Loss of consciousness,  Tremor, insomnia, depression, anxiety, and suicidal ideation.     Objective:  BP 130/84 (BP Location: Left Arm, Patient Position: Sitting, Cuff Size: Normal)   Pulse 60   Temp (!) 97.4 F (36.3 C) (Oral)   Resp 15   Ht 5' 6.5" (1.689 m)   Wt 172 lb 6.4 oz (78.2 kg)   SpO2 100%   BMI 27.41 kg/m   Physical Exam   General appearance: alert, cooperative and appears stated age Head: Normocephalic, without obvious abnormality, atraumatic Eyes: conjunctivae/corneas clear. PERRL, EOM's intact. Fundi benign. Ears: normal TM's and external ear canals both ears Nose: Nares normal. Septum midline. Mucosa  normal. No drainage or sinus tenderness. Throat: lips, mucosa, and tongue normal; teeth and gums normal Neck: no adenopathy, no carotid bruit, no JVD, supple, symmetrical, trachea midline and thyroid not enlarged, symmetric, no tenderness/mass/nodules Lungs: clear to auscultation bilaterally Heart: regular rate and rhythm, S1, S2 normal, no murmur, click, rub or gallop Abdomen: soft, non-tender; bowel sounds normal; no masses,  no organomegaly Extremities: extremities normal, atraumatic, no cyanosis or edema Pulses: 2+ and symmetric Skin: Skin color, texture, turgor normal. No rashes or lesions Neurologic: Alert and oriented X 3, normal strength and tone. Normal symmetric reflexes. Normal coordination and gait.      Assessment & Plan:   Problem List Items Addressed This Visit    Encounter for preventive health examination    Annual comprehensive  preventive exam was done as well as an evaluation and management of chronic conditions .  During the course of the visit the patient was educated and counseled about appropriate screening and preventive services including :  diabetes screening, lipid analysis with projected  10 year  risk for CAD , nutrition counseling, breast, cervical and colorectal cancer screening, and recommended immunizations.  Printed recommendations for health maintenance screenings was given      Hyperlipidemia    Untreated secondary to Statin intolerance,       Statin intolerance    A total of 25 minutes of face to face time was spent with patient more than half of which was spent in counselling about the above mentioned conditions  and coordination of care   I have discontinued Quita Skye P. Pucciarelli's baclofen. I am also having her maintain her warfarin, diphenhydrAMINE, Bisacodyl (LAXATIVE PO), ibuprofen, dofetilide, diltiazem, Magnesium, tamoxifen, Tdap, albuterol, warfarin, metoprolol succinate, and furosemide.  No orders of the defined types were placed in this encounter.   Medications Discontinued During This Encounter  Medication Reason  . baclofen (LIORESAL) 10 MG tablet Patient has not taken in last 30 days    Follow-up: Return in about 6 months (around 07/11/2018) for CPE .   Crecencio Mc, MD

## 2018-01-10 NOTE — Assessment & Plan Note (Signed)
Annual comprehensive preventive exam was done as well as an evaluation and management of chronic conditions .  During the course of the visit the patient was educated and counseled about appropriate screening and preventive services including :  diabetes screening, lipid analysis with projected  10 year  risk for CAD , nutrition counseling, breast, cervical and colorectal cancer screening, and recommended immunizations.  Printed recommendations for health maintenance screenings was given 

## 2018-01-10 NOTE — Patient Instructions (Signed)
Health Maintenance for Postmenopausal Women Menopause is a normal process in which your reproductive ability comes to an end. This process happens gradually over a span of months to years, usually between the ages of 22 and 9. Menopause is complete when you have missed 12 consecutive menstrual periods. It is important to talk with your health care provider about some of the most common conditions that affect postmenopausal women, such as heart disease, cancer, and bone loss (osteoporosis). Adopting a healthy lifestyle and getting preventive care can help to promote your health and wellness. Those actions can also lower your chances of developing some of these common conditions. What should I know about menopause? During menopause, you may experience a number of symptoms, such as:  Moderate-to-severe hot flashes.  Night sweats.  Decrease in sex drive.  Mood swings.  Headaches.  Tiredness.  Irritability.  Memory problems.  Insomnia.  Choosing to treat or not to treat menopausal changes is an individual decision that you make with your health care provider. What should I know about hormone replacement therapy and supplements? Hormone therapy products are effective for treating symptoms that are associated with menopause, such as hot flashes and night sweats. Hormone replacement carries certain risks, especially as you become older. If you are thinking about using estrogen or estrogen with progestin treatments, discuss the benefits and risks with your health care provider. What should I know about heart disease and stroke? Heart disease, heart attack, and stroke become more likely as you age. This may be due, in part, to the hormonal changes that your body experiences during menopause. These can affect how your body processes dietary fats, triglycerides, and cholesterol. Heart attack and stroke are both medical emergencies. There are many things that you can do to help prevent heart disease  and stroke:  Have your blood pressure checked at least every 1-2 years. High blood pressure causes heart disease and increases the risk of stroke.  If you are 53-22 years old, ask your health care provider if you should take aspirin to prevent a heart attack or a stroke.  Do not use any tobacco products, including cigarettes, chewing tobacco, or electronic cigarettes. If you need help quitting, ask your health care provider.  It is important to eat a healthy diet and maintain a healthy weight. ? Be sure to include plenty of vegetables, fruits, low-fat dairy products, and lean protein. ? Avoid eating foods that are high in solid fats, added sugars, or salt (sodium).  Get regular exercise. This is one of the most important things that you can do for your health. ? Try to exercise for at least 150 minutes each week. The type of exercise that you do should increase your heart rate and make you sweat. This is known as moderate-intensity exercise. ? Try to do strengthening exercises at least twice each week. Do these in addition to the moderate-intensity exercise.  Know your numbers.Ask your health care provider to check your cholesterol and your blood glucose. Continue to have your blood tested as directed by your health care provider.  What should I know about cancer screening? There are several types of cancer. Take the following steps to reduce your risk and to catch any cancer development as early as possible. Breast Cancer  Practice breast self-awareness. ? This means understanding how your breasts normally appear and feel. ? It also means doing regular breast self-exams. Let your health care provider know about any changes, no matter how small.  If you are 40  or older, have a clinician do a breast exam (clinical breast exam or CBE) every year. Depending on your age, family history, and medical history, it may be recommended that you also have a yearly breast X-ray (mammogram).  If you  have a family history of breast cancer, talk with your health care provider about genetic screening.  If you are at high risk for breast cancer, talk with your health care provider about having an MRI and a mammogram every year.  Breast cancer (BRCA) gene test is recommended for women who have family members with BRCA-related cancers. Results of the assessment will determine the need for genetic counseling and BRCA1 and for BRCA2 testing. BRCA-related cancers include these types: ? Breast. This occurs in males or females. ? Ovarian. ? Tubal. This may also be called fallopian tube cancer. ? Cancer of the abdominal or pelvic lining (peritoneal cancer). ? Prostate. ? Pancreatic.  Cervical, Uterine, and Ovarian Cancer Your health care provider may recommend that you be screened regularly for cancer of the pelvic organs. These include your ovaries, uterus, and vagina. This screening involves a pelvic exam, which includes checking for microscopic changes to the surface of your cervix (Pap test).  For women ages 21-65, health care providers may recommend a pelvic exam and a Pap test every three years. For women ages 79-65, they may recommend the Pap test and pelvic exam, combined with testing for human papilloma virus (HPV), every five years. Some types of HPV increase your risk of cervical cancer. Testing for HPV may also be done on women of any age who have unclear Pap test results.  Other health care providers may not recommend any screening for nonpregnant women who are considered low risk for pelvic cancer and have no symptoms. Ask your health care provider if a screening pelvic exam is right for you.  If you have had past treatment for cervical cancer or a condition that could lead to cancer, you need Pap tests and screening for cancer for at least 20 years after your treatment. If Pap tests have been discontinued for you, your risk factors (such as having a new sexual partner) need to be  reassessed to determine if you should start having screenings again. Some women have medical problems that increase the chance of getting cervical cancer. In these cases, your health care provider may recommend that you have screening and Pap tests more often.  If you have a family history of uterine cancer or ovarian cancer, talk with your health care provider about genetic screening.  If you have vaginal bleeding after reaching menopause, tell your health care provider.  There are currently no reliable tests available to screen for ovarian cancer.  Lung Cancer Lung cancer screening is recommended for adults 69-62 years old who are at high risk for lung cancer because of a history of smoking. A yearly low-dose CT scan of the lungs is recommended if you:  Currently smoke.  Have a history of at least 30 pack-years of smoking and you currently smoke or have quit within the past 15 years. A pack-year is smoking an average of one pack of cigarettes per day for one year.  Yearly screening should:  Continue until it has been 15 years since you quit.  Stop if you develop a health problem that would prevent you from having lung cancer treatment.  Colorectal Cancer  This type of cancer can be detected and can often be prevented.  Routine colorectal cancer screening usually begins at  age 42 and continues through age 45.  If you have risk factors for colon cancer, your health care provider may recommend that you be screened at an earlier age.  If you have a family history of colorectal cancer, talk with your health care provider about genetic screening.  Your health care provider may also recommend using home test kits to check for hidden blood in your stool.  A small camera at the end of a tube can be used to examine your colon directly (sigmoidoscopy or colonoscopy). This is done to check for the earliest forms of colorectal cancer.  Direct examination of the colon should be repeated every  5-10 years until age 71. However, if early forms of precancerous polyps or small growths are found or if you have a family history or genetic risk for colorectal cancer, you may need to be screened more often.  Skin Cancer  Check your skin from head to toe regularly.  Monitor any moles. Be sure to tell your health care provider: ? About any new moles or changes in moles, especially if there is a change in a mole's shape or color. ? If you have a mole that is larger than the size of a pencil eraser.  If any of your family members has a history of skin cancer, especially at a young age, talk with your health care provider about genetic screening.  Always use sunscreen. Apply sunscreen liberally and repeatedly throughout the day.  Whenever you are outside, protect yourself by wearing long sleeves, pants, a wide-brimmed hat, and sunglasses.  What should I know about osteoporosis? Osteoporosis is a condition in which bone destruction happens more quickly than new bone creation. After menopause, you may be at an increased risk for osteoporosis. To help prevent osteoporosis or the bone fractures that can happen because of osteoporosis, the following is recommended:  If you are 46-71 years old, get at least 1,000 mg of calcium and at least 600 mg of vitamin D per day.  If you are older than age 55 but younger than age 65, get at least 1,200 mg of calcium and at least 600 mg of vitamin D per day.  If you are older than age 54, get at least 1,200 mg of calcium and at least 800 mg of vitamin D per day.  Smoking and excessive alcohol intake increase the risk of osteoporosis. Eat foods that are rich in calcium and vitamin D, and do weight-bearing exercises several times each week as directed by your health care provider. What should I know about how menopause affects my mental health? Depression may occur at any age, but it is more common as you become older. Common symptoms of depression  include:  Low or sad mood.  Changes in sleep patterns.  Changes in appetite or eating patterns.  Feeling an overall lack of motivation or enjoyment of activities that you previously enjoyed.  Frequent crying spells.  Talk with your health care provider if you think that you are experiencing depression. What should I know about immunizations? It is important that you get and maintain your immunizations. These include:  Tetanus, diphtheria, and pertussis (Tdap) booster vaccine.  Influenza every year before the flu season begins.  Pneumonia vaccine.  Shingles vaccine.  Your health care provider may also recommend other immunizations. This information is not intended to replace advice given to you by your health care provider. Make sure you discuss any questions you have with your health care provider. Document Released: 12/13/2005  Document Revised: 05/10/2016 Document Reviewed: 07/25/2015 Elsevier Interactive Patient Education  2018 [employer]  

## 2018-01-13 DIAGNOSIS — H43813 Vitreous degeneration, bilateral: Secondary | ICD-10-CM | POA: Diagnosis not present

## 2018-01-14 ENCOUNTER — Ambulatory Visit: Payer: No Typology Code available for payment source

## 2018-01-27 ENCOUNTER — Ambulatory Visit (INDEPENDENT_AMBULATORY_CARE_PROVIDER_SITE_OTHER): Payer: Medicare Other

## 2018-01-27 VITALS — BP 126/72 | HR 60 | Temp 98.3°F | Resp 15 | Ht 66.5 in | Wt 172.0 lb

## 2018-01-27 DIAGNOSIS — Z0000 Encounter for general adult medical examination without abnormal findings: Secondary | ICD-10-CM

## 2018-01-27 NOTE — Patient Instructions (Addendum)
  Carol [name] , Thank you for taking time to come for your Medicare Wellness Visit. I appreciate your ongoing commitment to your health goals. Please review the following plan we discussed and let me know if I can assist you in the future.   Follow up as needed.    Bring a copy of your Reform and/or Living Will to be scanned into chart.  Have a great day!  These are the goals we discussed: Goals    . Portion controlled carbs       This is a list of the screening recommended for you and due dates:  Health Maintenance  Topic Date Due  . Tetanus Vaccine  11/27/2021  . Flu Shot  Completed  . DEXA scan (bone density measurement)  Completed  . Pneumonia vaccines  Completed

## 2018-01-27 NOTE — Progress Notes (Signed)
Subjective:   Carol [name] is a 77 y.o. female who presents for Medicare Annual (Subsequent) preventive examination.  Review of Systems:  No ROS.  Medicare Wellness Visit. Additional risk factors are reflected in the social history.  Cardiac Risk Factors include: advanced age (>6men, >75 women);hypertension     Objective:     Vitals: BP 126/72 (BP Location: Left Arm, Patient Position: Sitting, Cuff Size: Normal)   Pulse 60   Temp 98.3 F (36.8 C) (Oral)   Resp 15   Ht 5' 6.5" (1.689 m)   Wt 172 lb (78 kg)   SpO2 97%   BMI 27.35 kg/m   Body mass index is 27.35 kg/m.  Advanced Directives 01/27/2018 12/26/2016 11/27/2015  Does Patient Have a Medical Advance Directive? Yes Yes Yes  Type of Paramedic of [place];Living will [place];Living will [place];Living will  Does patient want to make changes to medical advance directive? No - Patient declined No - Patient declined No - Patient declined  Copy of [place] in Chart? No - copy requested No - copy requested -    Tobacco Social History   Tobacco Use  Smoking Status Never Smoker  Smokeless Tobacco Never Used     Counseling given: Not Answered   Clinical Intake:  Pre-visit preparation completed: Yes  Pain : No/denies pain     Nutritional Status: BMI 25 -29 Overweight Diabetes: No  How often do you need to have someone help you when you read instructions, pamphlets, or other written materials from your doctor or pharmacy?: 1 - Never  Interpreter Needed?: No     Past Medical History:  Diagnosis Date  . Chronic sinusitis   . Fibrocystic breast disease   . History of pneumonia 1999  . Hyperlipidemia   . Irritable bowel syndrome   . Lichen planus   . 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 Mild tricuspid insufficiency Jan 2012   ECHO, [place]  . Moderate mitral insufficiency JAN 2012   ECHO, [place]    Past Surgical History:  Procedure Laterality Date  . ABDOMINAL HYSTERECTOMY     precancerous cervix,    . ABLATION OF DYSRHYTHMIC FOCUS  August 2013   [facility], Dr. Marcello Moores  . BREAST SURGERY     bilateral, benign biopsies  . MAXILLARY SINUS LIFT  1990's   [name]  . oophorectomy    . squamous cell removal Right    right leg calf area.   Family History  Problem Relation Age of Onset  . Cancer Mother        Breast  . Hyperlipidemia Father   . Heart disease Father   . Hypertension Father   . Kidney disease Father   . Cancer Maternal Grandfather        colon CA   Social History   Socioeconomic History  . Marital status: Married    Spouse name: Not on file  . Number of children: Not on file  . Years of education: Not on file  . Highest education level: Not on file  Occupational History  . Not on file  Social Needs  . Financial resource strain: Not on file  . Food insecurity:    Worry: Not on file    Inability: Not on file  . Transportation needs:    Medical: Not on file    Non-medical: Not on file  Tobacco Use  . Smoking status: Never  Smoker  . Smokeless tobacco: Never Used  Substance and Sexual Activity  . Alcohol use: Yes    Alcohol/week: 2.4 oz    Types: 4 Glasses of wine per week    Comment: Occ.  . Drug use: No  . Sexual activity: Never  Lifestyle  . Physical activity:    Days per week: Not on file    Minutes per session: Not on file  . Stress: Not on file  Relationships  . Social connections:    Talks on phone: Not on file    Gets together: Not on file    Attends religious service: Not on file    Active member of club or organization: Not on file    Attends meetings of clubs or organizations: Not on file    Relationship status: Not on file  Other Topics Concern  . Not on file  Social History Narrative  . Not on file    Outpatient Encounter Medications as of 01/27/2018  Medication Sig  . albuterol (PROVENTIL HFA;VENTOLIN HFA) 108 (90 BASE)  MCG/ACT inhaler Inhale 2 puffs into the lungs every 6 (six) hours as needed.  . Bisacodyl (LAXATIVE PO) Take 1 tablet by mouth daily as needed. Reported on 11/28/2015  . diltiazem (CARDIZEM) 60 MG tablet Take 1 tablet (60 mg total) by mouth as needed.  . diphenhydrAMINE (BENADRYL) 25 mg capsule Take 25 mg by mouth every 6 (six) hours as needed.  . dofetilide (TIKOSYN) 250 MCG capsule Take 250 mcg by mouth 2 (two) times daily.  . furosemide (LASIX) 20 MG tablet Take 1 tablet (20 mg total) by mouth daily.  Marland Kitchen ibuprofen (ADVIL,MOTRIN) 200 MG tablet Take 200 mg by mouth every 6 (six) hours as needed.  . Magnesium 400 MG CAPS Take 1 capsule by mouth 2 (two) times daily.  . metoprolol succinate (TOPROL-XL) 50 MG 24 hr tablet Take 25 mg by mouth daily.  . tamoxifen (NOLVADEX) 20 MG tablet Take 20 mg by mouth daily.  . Tdap (BOOSTRIX) 5-2.5-18.5 LF-MCG/0.5 injection Inject 0.5 mLs into the muscle once.  . warfarin (COUMADIN) 1 MG tablet Take 1 mg by mouth daily.  Marland Kitchen warfarin (COUMADIN) 6 MG tablet Take 6 mg by mouth daily.  . [DISCONTINUED] levalbuterol (XOPENEX HFA) 45 MCG/ACT inhaler Inhale 1-2 puffs into the lungs every 4 (four) hours as needed for wheezing.   No facility-administered encounter medications on file as of 01/27/2018.     Activities of Daily Living In your present state of health, do you have any difficulty performing the following activities: 01/27/2018  Hearing? N  Vision? N  Difficulty concentrating or making decisions? N  Walking or climbing stairs? N  Dressing or bathing? N  Doing errands, shopping? N  Preparing Food and eating ? N  Using the Toilet? N  In the past six months, have you accidently leaked urine? N  Do you have problems with loss of bowel control? N  Managing your Medications? N  Managing your Finances? N  Housekeeping or managing your Housekeeping? N  Some recent data might be hidden    Patient Care Team: Crecencio Mc, MD as PCP - General (Internal  Medicine) Corey Skains, MD as Referring Physician (Internal Medicine)    Assessment:   This is a routine wellness examination for Carol [name].  The goal of the wellness visit is to assist the patient how to close the gaps in care and create a preventative care plan for the patient.   The roster of  all physicians providing medical care to patient is listed in the Snapshot section of the chart.  Osteoporosis risk reviewed.    Safety issues reviewed; Smoke and carbon monoxide detectors in the home. No firearms in the home. Wears seatbelts when driving or riding with others. No violence in the home.  They do not have excessive sun exposure.  Discussed the need for sun protection: hats, long sleeves and the use of sunscreen if there is significant sun exposure.  Patient is alert, normal appearance, oriented to person/place/and time.  Correctly identified the president of the [place] and recalls of 2/3 words. Performs simple calculations and can read correct time from watch face. Displays appropriate judgement.  No new identified risk were noted.  No failures at ADL's or IADL's.    BMI- discussed the importance of a healthy diet, water intake and the benefits of aerobic exercise. Educational material provided.   24 hour diet recall: Regular diet  Dental- every 6 months  Eye- Visual acuity not assessed per patient preference since they have regular follow up with the ophthalmologist.  Wears corrective lenses.  Sleep patterns- Sleeps through the night without issues.  Wakes feeling rested.  Health maintenance gaps- closed.  Patient Concerns: None at this time. Follow up with PCP as needed.  Exercise Activities and Dietary recommendations Current Exercise Habits: Home exercise routine, Type of exercise: walking;exercise ball;yoga, Time (Minutes): > 60, Frequency (Times/Week): 5, Weekly Exercise (Minutes/Week): 0, Intensity: Moderate  Goals    . Portion controlled carbs       Fall  Risk Fall Risk  01/27/2018 01/08/2018 12/26/2016 11/27/2015 11/07/2014  Falls in the past year? No No No Yes No  Number falls in past yr: - - - 1 -  Injury with Fall? - - - No -  Follow up - - - Education provided;Falls prevention discussed -   Depression Screen PHQ 2/9 Scores 01/27/2018 01/08/2018 12/26/2016 11/27/2015  PHQ - 2 Score 0 0 0 0  PHQ- 9 Score - 0 - -     Cognitive Function MMSE - Mini Mental State Exam 12/26/2016 11/27/2015  Orientation to time 5 5  Orientation to Place 5 5  Registration 3 3  Attention/ Calculation 4 5  Recall 2 3  Language- name 2 objects 2 2  Language- repeat 1 1  Language- follow 3 step command 3 3  Language- read & follow direction 1 1  Write a sentence 1 1  Copy design 1 1  Total score 28 30     6CIT Screen 01/27/2018  What Year? 0 points  What month? 0 points  What time? 0 points  Count back from 20 0 points  Months in reverse 0 points  Repeat phrase 0 points  Total Score 0    Immunization History  Administered Date(s) Administered  . Influenza, High Dose Seasonal PF 09/26/2016  . Influenza, Seasonal, Injecte, Preservative Fre 11/24/2012  . Influenza,inj,Quad PF,6+ Mos 08/24/2013, 09/15/2014, 08/30/2015  . Influenza-Unspecified 08/04/2013, 08/07/2014, 09/19/2015  . Pneumococcal Conjugate-13 11/27/2015  . Pneumococcal Polysaccharide-23 11/05/2013  . Tdap 11/28/2011  . Zoster 05/17/2008   Screening Tests Health Maintenance  Topic Date Due  . TETANUS/TDAP  11/27/2021  . INFLUENZA VACCINE  Completed  . DEXA SCAN  Completed  . PNA vac Low Risk Adult  Completed      Plan:    End of life planning; Advance aging; Advanced directives discussed. Copy of current HCPOA/Living Will requested.    I have personally reviewed and noted the  following in the patient's chart:   . Medical and social history . Use of alcohol, tobacco or illicit drugs  . Current medications and supplements . Functional ability and status . Nutritional  status . Physical activity . Advanced directives . List of other physicians . Hospitalizations, surgeries, and ER visits in previous 12 months . Vitals . Screenings to include cognitive, depression, and falls . Referrals and appointments  In addition, I have reviewed and discussed with patient certain preventive protocols, quality metrics, and best practice recommendations. A written personalized care plan for preventive services as well as general preventive health recommendations were provided to patient.     Varney Biles, LPN  [id number]/[id number]

## 2018-01-30 DIAGNOSIS — I480 Paroxysmal atrial fibrillation: Secondary | ICD-10-CM | POA: Diagnosis not present

## 2018-01-30 DIAGNOSIS — G454 Transient global amnesia: Secondary | ICD-10-CM | POA: Diagnosis not present

## 2018-02-09 DIAGNOSIS — I480 Paroxysmal atrial fibrillation: Secondary | ICD-10-CM | POA: Diagnosis not present

## 2018-02-10 DIAGNOSIS — E782 Mixed hyperlipidemia: Secondary | ICD-10-CM | POA: Diagnosis not present

## 2018-02-10 DIAGNOSIS — I480 Paroxysmal atrial fibrillation: Secondary | ICD-10-CM | POA: Diagnosis not present

## 2018-02-10 DIAGNOSIS — Z5181 Encounter for therapeutic drug level monitoring: Secondary | ICD-10-CM | POA: Diagnosis not present

## 2018-02-10 DIAGNOSIS — I493 Ventricular premature depolarization: Secondary | ICD-10-CM | POA: Diagnosis not present

## 2018-02-10 DIAGNOSIS — I10 Essential (primary) hypertension: Secondary | ICD-10-CM | POA: Diagnosis not present

## 2018-02-10 DIAGNOSIS — I341 Nonrheumatic mitral (valve) prolapse: Secondary | ICD-10-CM | POA: Diagnosis not present

## 2018-02-10 DIAGNOSIS — Z79899 Other long term (current) drug therapy: Secondary | ICD-10-CM | POA: Diagnosis not present

## 2018-02-12 DIAGNOSIS — L814 Other melanin hyperpigmentation: Secondary | ICD-10-CM | POA: Diagnosis not present

## 2018-02-12 DIAGNOSIS — L578 Other skin changes due to chronic exposure to nonionizing radiation: Secondary | ICD-10-CM | POA: Diagnosis not present

## 2018-02-12 DIAGNOSIS — C4441 Basal cell carcinoma of skin of scalp and neck: Secondary | ICD-10-CM | POA: Diagnosis not present

## 2018-02-12 DIAGNOSIS — L988 Other specified disorders of the skin and subcutaneous tissue: Secondary | ICD-10-CM | POA: Diagnosis not present

## 2018-02-26 ENCOUNTER — Ambulatory Visit (INDEPENDENT_AMBULATORY_CARE_PROVIDER_SITE_OTHER): Payer: Medicare Other | Admitting: Family Medicine

## 2018-02-26 ENCOUNTER — Encounter: Payer: Self-pay | Admitting: Family Medicine

## 2018-02-26 VITALS — BP 120/70 | HR 54 | Temp 97.9°F | Wt 172.2 lb

## 2018-02-26 DIAGNOSIS — J0140 Acute pansinusitis, unspecified: Secondary | ICD-10-CM | POA: Diagnosis not present

## 2018-02-26 MED ORDER — AMOXICILLIN-POT CLAVULANATE 875-125 MG PO TABS: 1.0000 | ORAL_TABLET | Freq: Two times a day (BID) | ORAL | 0 refills | Status: DC

## 2018-02-26 NOTE — Progress Notes (Signed)
PCP: Crecencio Mc, MD  Subjective:  Carol [name] is a 77 y.o. year old very pleasant female patient who presents with symptoms including nasal congestion, sinus pressure/pain, post nasal drip, cough that is nonproductive -started: one week ago, symptoms are not improving -previous treatments: Robitussin has provided moderate benefit for cough -sick contacts/travel/risks: denies flu exposure. No recent sick contact exposure. No recent antibiotic use  ROS-denies fever, SOB, NVD, tooth pain  Pertinent Past Medical History- Lymphoma of lymph nodes of head, face, and/or neck. Followed by oncology twice yearly now at this point. History of breast cancer and takes tamoxifen. A-fib  Medications- reviewed  Current Outpatient Medications  Medication Sig Dispense Refill  . albuterol (PROVENTIL HFA;VENTOLIN HFA) 108 (90 BASE) MCG/ACT inhaler Inhale 2 puffs into the lungs every 6 (six) hours as needed. 18 g 0  . Bisacodyl (LAXATIVE PO) Take 1 tablet by mouth daily as needed. Reported on 11/28/2015    . diltiazem (CARDIZEM) 60 MG tablet Take 1 tablet (60 mg total) by mouth as needed. 90 tablet 1  . diphenhydrAMINE (BENADRYL) 25 mg capsule Take 25 mg by mouth every 6 (six) hours as needed.    . dofetilide (TIKOSYN) 250 MCG capsule Take 250 mcg by mouth 2 (two) times daily.    . furosemide (LASIX) 20 MG tablet Take 1 tablet (20 mg total) by mouth daily. 90 tablet 1  . ibuprofen (ADVIL,MOTRIN) 200 MG tablet Take 200 mg by mouth every 6 (six) hours as needed.    . Magnesium 400 MG CAPS Take 1 capsule by mouth 2 (two) times daily. 180 capsule 3  . metoprolol succinate (TOPROL-XL) 50 MG 24 hr tablet Take 25 mg by mouth daily.    . tamoxifen (NOLVADEX) 20 MG tablet Take 20 mg by mouth daily.    . Tdap (BOOSTRIX) 5-2.5-18.5 LF-MCG/0.5 injection Inject 0.5 mLs into the muscle once. 0.5 mL 0  . warfarin (COUMADIN) 1 MG tablet Take 1 mg by mouth daily.    Marland Kitchen warfarin (COUMADIN) 6 MG tablet Take 6 mg  by mouth daily.     No current facility-administered medications for this visit.     Objective: BP 120/70 (BP Location: Left Arm, Patient Position: Sitting, Cuff Size: Normal)   Pulse (!) 54   Temp 97.9 F (36.6 C) (Oral)   Wt 172 lb 4 oz (78.1 kg)   SpO2 96%   BMI 27.39 kg/m  Gen: NAD, resting comfortably HEENT: Turbinates erythematous, TMs normal bilaterally, pharynx mildly erythematous with no tonsilar exudate or edema, +frontal and maxillary sinus tenderness CV: RRR no murmurs rubs or gallops Lungs: CTAB no crackles, wheeze, rhonchi Abdomen: soft/nontender/nondistended/normal bowel sounds. No rebound or guarding.  Ext: no edema Skin: warm, dry, no rash Neuro: grossly normal, moves all extremities  Assessment/Plan: 1. Acute pansinusitis, recurrence not specified Symptoms are most consistent with sinus infection. Will provide Augmentin and advised use of probiotics. We also discussed that this may interact with warfarin and she will let [facility] who manages her INR know of this prescription and advise follow up INR. She is adherent to monthly INR checks. Advised patient on supportive measures:  Get rest, drink plenty of fluids and follow up if fever >101, if symptoms worsen or if symptoms are not improved in 3 to 4 days. Patient verbalizes understanding.   - amoxicillin-clavulanate (AUGMENTIN) 875-125 MG tablet; Take 1 tablet by mouth 2 (two) times daily.  Dispense: 20 tablet; Refill: 0  Finally, we reviewed reasons to return to  care including if symptoms worsen or persist or new concerns arise- once again particularly shortness of breath or fever.    Laurita Quint, FNP

## 2018-02-26 NOTE — Patient Instructions (Signed)
Please take medication as directed with food and use probiotics as discussed.  Please drink plenty of water so that your urine is pale yellow or clear. Also, get plenty of rest and follow up if symptoms do not improve in 3 to 4 days, worsen, or you develop a fever >101.    Sinusitis, Adult Sinusitis is soreness and inflammation of your sinuses. Sinuses are hollow spaces in the bones around your face. They are located:  Around your eyes.  In the middle of your forehead.  Behind your nose.  In your cheekbones.  Your sinuses and nasal passages are lined with a stringy fluid (mucus). Mucus normally drains out of your sinuses. When your nasal tissues get inflamed or swollen, the mucus can get trapped or blocked so air cannot flow through your sinuses. This lets bacteria, viruses, and funguses grow, and that leads to infection. Follow these instructions at home: Medicines  Take, use, or apply over-the-counter and prescription medicines only as told by your doctor. These may include nasal sprays.  If you were prescribed an antibiotic medicine, take it as told by your doctor. Do not stop taking the antibiotic even if you start to feel better. Hydrate and Humidify  Drink enough water to keep your pee (urine) clear or pale yellow.  Use a cool mist humidifier to keep the humidity level in your home above 50%.  Breathe in steam for 10-15 minutes, 3-4 times a day or as told by your doctor. You can do this in the bathroom while a hot shower is running.  Try not to spend time in cool or dry air. Rest  Rest as much as possible.  Sleep with your head raised (elevated).  Make sure to get enough sleep each night. General instructions  Put a warm, moist washcloth on your face 3-4 times a day or as told by your doctor. This will help with discomfort.  Wash your hands often with soap and water. If there is no soap and water, use hand sanitizer.  Do not smoke. Avoid being around people who are  smoking (secondhand smoke).  Keep all follow-up visits as told by your doctor. This is important. Contact a doctor if:  You have a fever.  Your symptoms get worse.  Your symptoms do not get better within 10 days. Get help right away if:  You have a very bad headache.  You cannot stop throwing up (vomiting).  You have pain or swelling around your face or eyes.  You have trouble seeing.  You feel confused.  Your neck is stiff.  You have trouble breathing. This information is not intended to replace advice given to you by your health care provider. Make sure you discuss any questions you have with your health care provider. Document Released: 04/08/2008 Document Revised: 06/16/2016 Document Reviewed: 08/16/2015 Elsevier Interactive Patient Education  [employer].

## 2018-03-04 DIAGNOSIS — C50511 Malignant neoplasm of lower-outer quadrant of right female breast: Secondary | ICD-10-CM | POA: Diagnosis not present

## 2018-03-04 DIAGNOSIS — Z170 Estrogen receptor positive status [ER+]: Secondary | ICD-10-CM | POA: Diagnosis not present

## 2018-03-09 DIAGNOSIS — Z9221 Personal history of antineoplastic chemotherapy: Secondary | ICD-10-CM | POA: Diagnosis not present

## 2018-03-09 DIAGNOSIS — C44712 Basal cell carcinoma of skin of right lower limb, including hip: Secondary | ICD-10-CM | POA: Diagnosis not present

## 2018-03-09 DIAGNOSIS — Z7901 Long term (current) use of anticoagulants: Secondary | ICD-10-CM | POA: Diagnosis not present

## 2018-03-09 DIAGNOSIS — I482 Chronic atrial fibrillation: Secondary | ICD-10-CM | POA: Diagnosis not present

## 2018-03-09 DIAGNOSIS — Z170 Estrogen receptor positive status [ER+]: Secondary | ICD-10-CM | POA: Diagnosis not present

## 2018-03-09 DIAGNOSIS — C50919 Malignant neoplasm of unspecified site of unspecified female breast: Secondary | ICD-10-CM | POA: Diagnosis not present

## 2018-03-09 DIAGNOSIS — C8311 Mantle cell lymphoma, lymph nodes of head, face, and neck: Secondary | ICD-10-CM | POA: Diagnosis not present

## 2018-03-09 DIAGNOSIS — C8599 Non-Hodgkin lymphoma, unspecified, extranodal and solid organ sites: Secondary | ICD-10-CM | POA: Diagnosis not present

## 2018-03-09 DIAGNOSIS — C50311 Malignant neoplasm of lower-inner quadrant of right female breast: Secondary | ICD-10-CM | POA: Diagnosis not present

## 2018-03-09 DIAGNOSIS — C8580 Other specified types of non-Hodgkin lymphoma, unspecified site: Secondary | ICD-10-CM | POA: Diagnosis not present

## 2018-03-09 DIAGNOSIS — M2550 Pain in unspecified joint: Secondary | ICD-10-CM | POA: Diagnosis not present

## 2018-03-09 DIAGNOSIS — J329 Chronic sinusitis, unspecified: Secondary | ICD-10-CM | POA: Diagnosis not present

## 2018-03-10 DIAGNOSIS — I480 Paroxysmal atrial fibrillation: Secondary | ICD-10-CM | POA: Diagnosis not present

## 2018-04-07 DIAGNOSIS — I480 Paroxysmal atrial fibrillation: Secondary | ICD-10-CM | POA: Diagnosis not present

## 2018-04-13 DIAGNOSIS — Z85828 Personal history of other malignant neoplasm of skin: Secondary | ICD-10-CM | POA: Diagnosis not present

## 2018-04-28 DIAGNOSIS — R791 Abnormal coagulation profile: Secondary | ICD-10-CM | POA: Diagnosis not present

## 2018-05-01 DIAGNOSIS — C44519 Basal cell carcinoma of skin of other part of trunk: Secondary | ICD-10-CM | POA: Diagnosis not present

## 2018-05-01 DIAGNOSIS — D2261 Melanocytic nevi of right upper limb, including shoulder: Secondary | ICD-10-CM | POA: Diagnosis not present

## 2018-05-01 DIAGNOSIS — Z08 Encounter for follow-up examination after completed treatment for malignant neoplasm: Secondary | ICD-10-CM | POA: Diagnosis not present

## 2018-05-01 DIAGNOSIS — Z85828 Personal history of other malignant neoplasm of skin: Secondary | ICD-10-CM | POA: Diagnosis not present

## 2018-05-01 DIAGNOSIS — D2271 Melanocytic nevi of right lower limb, including hip: Secondary | ICD-10-CM | POA: Diagnosis not present

## 2018-05-01 DIAGNOSIS — D2272 Melanocytic nevi of left lower limb, including hip: Secondary | ICD-10-CM | POA: Diagnosis not present

## 2018-05-01 DIAGNOSIS — D2262 Melanocytic nevi of left upper limb, including shoulder: Secondary | ICD-10-CM | POA: Diagnosis not present

## 2018-05-01 DIAGNOSIS — L821 Other seborrheic keratosis: Secondary | ICD-10-CM | POA: Diagnosis not present

## 2018-05-01 DIAGNOSIS — D225 Melanocytic nevi of trunk: Secondary | ICD-10-CM | POA: Diagnosis not present

## 2018-05-01 DIAGNOSIS — D485 Neoplasm of uncertain behavior of skin: Secondary | ICD-10-CM | POA: Diagnosis not present

## 2018-05-11 DIAGNOSIS — I480 Paroxysmal atrial fibrillation: Secondary | ICD-10-CM | POA: Diagnosis not present

## 2018-05-11 DIAGNOSIS — Z5181 Encounter for therapeutic drug level monitoring: Secondary | ICD-10-CM | POA: Diagnosis not present

## 2018-05-11 DIAGNOSIS — I341 Nonrheumatic mitral (valve) prolapse: Secondary | ICD-10-CM | POA: Diagnosis not present

## 2018-05-11 DIAGNOSIS — Z79899 Other long term (current) drug therapy: Secondary | ICD-10-CM | POA: Diagnosis not present

## 2018-05-11 DIAGNOSIS — J45909 Unspecified asthma, uncomplicated: Secondary | ICD-10-CM | POA: Insufficient documentation

## 2018-05-11 DIAGNOSIS — J410 Simple chronic bronchitis: Secondary | ICD-10-CM | POA: Diagnosis not present

## 2018-05-11 DIAGNOSIS — T148XXA Other injury of unspecified body region, initial encounter: Secondary | ICD-10-CM | POA: Insufficient documentation

## 2018-05-11 DIAGNOSIS — I10 Essential (primary) hypertension: Secondary | ICD-10-CM | POA: Diagnosis not present

## 2018-06-01 DIAGNOSIS — R791 Abnormal coagulation profile: Secondary | ICD-10-CM | POA: Diagnosis not present

## 2018-07-02 DIAGNOSIS — I480 Paroxysmal atrial fibrillation: Secondary | ICD-10-CM | POA: Diagnosis not present

## 2018-07-09 DIAGNOSIS — C44519 Basal cell carcinoma of skin of other part of trunk: Secondary | ICD-10-CM | POA: Diagnosis not present

## 2018-07-13 ENCOUNTER — Encounter: Payer: No Typology Code available for payment source | Admitting: Internal Medicine

## 2018-07-13 DIAGNOSIS — Z9289 Personal history of other medical treatment: Secondary | ICD-10-CM | POA: Diagnosis not present

## 2018-07-13 DIAGNOSIS — C50919 Malignant neoplasm of unspecified site of unspecified female breast: Secondary | ICD-10-CM | POA: Diagnosis not present

## 2018-07-13 DIAGNOSIS — Z9221 Personal history of antineoplastic chemotherapy: Secondary | ICD-10-CM | POA: Diagnosis not present

## 2018-07-13 DIAGNOSIS — Z885 Allergy status to narcotic agent status: Secondary | ICD-10-CM | POA: Diagnosis not present

## 2018-07-13 DIAGNOSIS — Z803 Family history of malignant neoplasm of breast: Secondary | ICD-10-CM | POA: Diagnosis not present

## 2018-07-13 DIAGNOSIS — C8599 Non-Hodgkin lymphoma, unspecified, extranodal and solid organ sites: Secondary | ICD-10-CM | POA: Diagnosis not present

## 2018-07-13 DIAGNOSIS — Z923 Personal history of irradiation: Secondary | ICD-10-CM | POA: Diagnosis not present

## 2018-07-13 DIAGNOSIS — I4891 Unspecified atrial fibrillation: Secondary | ICD-10-CM | POA: Diagnosis not present

## 2018-07-13 DIAGNOSIS — J45909 Unspecified asthma, uncomplicated: Secondary | ICD-10-CM | POA: Diagnosis not present

## 2018-07-13 DIAGNOSIS — Z90721 Acquired absence of ovaries, unilateral: Secondary | ICD-10-CM | POA: Diagnosis not present

## 2018-07-13 DIAGNOSIS — C8590 Non-Hodgkin lymphoma, unspecified, unspecified site: Secondary | ICD-10-CM | POA: Diagnosis not present

## 2018-07-30 ENCOUNTER — Encounter: Payer: Self-pay | Admitting: Family Medicine

## 2018-07-30 ENCOUNTER — Ambulatory Visit (INDEPENDENT_AMBULATORY_CARE_PROVIDER_SITE_OTHER): Payer: Medicare Other | Admitting: Family Medicine

## 2018-07-30 ENCOUNTER — Telehealth: Payer: Self-pay | Admitting: Internal Medicine

## 2018-07-30 VITALS — BP 140/88 | HR 54 | Temp 98.7°F | Ht 66.0 in | Wt 176.8 lb

## 2018-07-30 DIAGNOSIS — R059 Cough, unspecified: Secondary | ICD-10-CM

## 2018-07-30 DIAGNOSIS — J019 Acute sinusitis, unspecified: Secondary | ICD-10-CM

## 2018-07-30 DIAGNOSIS — R0982 Postnasal drip: Secondary | ICD-10-CM | POA: Diagnosis not present

## 2018-07-30 DIAGNOSIS — R05 Cough: Secondary | ICD-10-CM

## 2018-07-30 MED ORDER — DOXYCYCLINE HYCLATE 100 MG PO TABS: 100.0000 mg | ORAL_TABLET | Freq: Two times a day (BID) | ORAL | 0 refills | Status: DC

## 2018-07-30 MED ORDER — BENZONATATE 100 MG PO CAPS: 100.0000 mg | ORAL_CAPSULE | Freq: Two times a day (BID) | ORAL | 0 refills | Status: DC | PRN

## 2018-07-30 NOTE — Progress Notes (Signed)
Subjective:    Patient ID: Carol [name], female    DOB: 03/24/1941, 77 y.o.   MRN: [id number]  HPI  Patient presents to clinic complaining of cough, nasal congestion, sinus pain, yellow nasal drainage that has been present for over a week, beginning last Monday.  Denies any fever or chills.  [place] time [place] the beach with [place] and young grandchildren, some of the children were sick with cough, congestion symptoms.   Patient Active Problem List   Diagnosis Date Noted  . Statin intolerance 01/08/2018  . Transient global amnesia 10/08/2017  . Sinusitis, acute maxillary 10/08/2017  . Hepatic steatosis 07/23/2017  . Colicky epigastric pain 07/13/2017  . Hearing loss in left ear 07/13/2017  . Neck pain 03/28/2017  . Encounter for preventive health examination 11/30/2015  . Long term current use of anticoagulant therapy 05/16/2015  . Screening for colon cancer 11/07/2014  . S/P TAH-BSO (total abdominal hysterectomy and bilateral salpingo-oophorectomy) 11/07/2014  . Breast cancer ([place]) 06/06/2014  . Medicare annual wellness visit, subsequent 05/17/2014  . Lymphoma of lymph nodes of head, face, and/or neck ([place]) 08/15/2012  . Atrial fibrillation with rapid ventricular response ([place]) 08/15/2012  . Malignant neoplasm of orbit ([place]) 08/12/2012  . Abnormal chest CT 04/16/2012  . Lichen planus   . Irritable bowel syndrome   . Moderate mitral insufficiency   . Mild tricuspid insufficiency   . Hyperlipidemia    Social History   Tobacco Use  . Smoking status: Never Smoker  . Smokeless tobacco: Never Used  Substance Use Topics  . Alcohol use: Yes    Alcohol/week: 4.0 standard drinks    Types: 4 Glasses of wine per week    Comment: Occ.   Review of Systems   Constitutional: Negative for chills, fatigue and fever.  HENT: + congestion, ear pain, sinus pain    Eyes: Negative.   Respiratory: + cough. Negative for shortness of breath and wheezing.   Cardiovascular:  Negative for chest pain, palpitations and leg swelling.  Gastrointestinal: Negative for abdominal pain, diarrhea, nausea and vomiting.  Genitourinary: Negative for dysuria, frequency and urgency.  Musculoskeletal: Negative for arthralgias and myalgias.  Skin: Negative for color change, pallor and rash.  Neurological: Negative for syncope, light-headedness and headaches.  Psychiatric/Behavioral: The patient is not nervous/anxious.       Objective:   Physical Exam  Constitutional: She appears well-developed and well-nourished. No distress.  HENT:  Head: Normocephalic and atraumatic.  Eyes:  EOM are normal. No scleral icterus.  Ears: Fullness bilat TMs Nose/Throat: +facial and maxillary sinus tenderness. +thick yellow nasal discharge and post nasal drip Neck: Normal range of motion. Neck supple. No tracheal deviation present.  Cardiovascular: Normal rate, regular rhythm and normal heart sounds.  Pulmonary/Chest: Effort normal and breath sounds normal. No respiratory distress. She has no wheezes. She has no rales.  Neurological: She is alert and oriented to person, place, and time.  Gait normal  Skin: Skin is warm and dry. No pallor.  Psychiatric: She has a normal mood and affect. Her behavior is normal. Thought content normal.  Nursing note and vitals reviewed.     Vitals:   07/30/18 1630 07/30/18 1645  BP: (!) 148/98 140/88  Pulse: (!) 47 (!) 54  Temp: 98.7 F (37.1 C)   SpO2: 98%     Assessment & Plan:   Sinusitis-patient will take course of doxycycline 100 mg twice daily for 10 days.  Also advised to try saline nasal washes to help  flush out congestion.  Rest, drink plenty of fluids, do good handwashing.  Postnasal drip/cough- I suspect cough is related to postnasal drip as lungs are clear on exam.  Patient given prescription for Tessalon [place] to use as needed to calm cough.  Keep regularly scheduled follow-up as planned.  Return to clinic sooner if any issues arise.   Patient is on Coumadin and is aware that antibiotics can affect Coumadin levels, she has appointment with Coumadin clinic on October 1 for INR recheck.

## 2018-07-30 NOTE — Telephone Encounter (Signed)
Copied from [place] Line [id number]. Topic: Quick Communication - Rx Refill/Question >> Jul 30, 2018  6:55 PM Marin Olp L wrote: Medication: tessalon, doxycycline  Has the patient contacted their pharmacy? Yes.   (Agent: If no, request that the patient contact the pharmacy for the refill.) (Agent: If yes, when and what did the pharmacy advise?)  Preferred Pharmacy (with phone number or street name): [facility] [address] [place] - [place] [address] Phone: [phone] Fax: [phone]    Agent: Please be advised that RX refills may take up to 3 business days. We ask that you follow-up with your pharmacy.

## 2018-07-31 ENCOUNTER — Other Ambulatory Visit: Payer: Self-pay

## 2018-07-31 ENCOUNTER — Encounter: Payer: Self-pay | Admitting: Family Medicine

## 2018-07-31 DIAGNOSIS — J019 Acute sinusitis, unspecified: Secondary | ICD-10-CM

## 2018-07-31 DIAGNOSIS — R05 Cough: Secondary | ICD-10-CM

## 2018-07-31 DIAGNOSIS — R059 Cough, unspecified: Secondary | ICD-10-CM

## 2018-07-31 MED ORDER — BENZONATATE 100 MG PO CAPS: 100.0000 mg | ORAL_CAPSULE | Freq: Two times a day (BID) | ORAL | 0 refills | Status: DC | PRN

## 2018-07-31 MED ORDER — DOXYCYCLINE HYCLATE 100 MG PO TABS: 100.0000 mg | ORAL_TABLET | Freq: Two times a day (BID) | ORAL | 0 refills | Status: DC

## 2018-07-31 NOTE — Telephone Encounter (Signed)
rx for tessalon and doxycycline has been sent to [facility] on [address].

## 2018-08-04 DIAGNOSIS — R791 Abnormal coagulation profile: Secondary | ICD-10-CM | POA: Diagnosis not present

## 2018-08-06 NOTE — Telephone Encounter (Signed)
Pt says that she received a call from her mail order stating that medication will be sent out from them. Pt says that she was told that it would be cancelled via mail order. Pt would like the office assistance in making sure that order has been cancelled via mail order.

## 2018-08-18 DIAGNOSIS — R791 Abnormal coagulation profile: Secondary | ICD-10-CM | POA: Diagnosis not present

## 2018-08-25 ENCOUNTER — Ambulatory Visit (INDEPENDENT_AMBULATORY_CARE_PROVIDER_SITE_OTHER): Payer: Medicare Other | Admitting: Internal Medicine

## 2018-08-25 ENCOUNTER — Encounter: Payer: Self-pay | Admitting: Internal Medicine

## 2018-08-25 VITALS — BP 116/72 | HR 82 | Temp 98.2°F | Resp 15 | Ht 66.0 in | Wt 175.0 lb

## 2018-08-25 DIAGNOSIS — E785 Hyperlipidemia, unspecified: Secondary | ICD-10-CM

## 2018-08-25 DIAGNOSIS — E7800 Pure hypercholesterolemia, unspecified: Secondary | ICD-10-CM

## 2018-08-25 DIAGNOSIS — H6121 Impacted cerumen, right ear: Secondary | ICD-10-CM | POA: Diagnosis not present

## 2018-08-25 DIAGNOSIS — E559 Vitamin D deficiency, unspecified: Secondary | ICD-10-CM

## 2018-08-25 DIAGNOSIS — K760 Fatty (change of) liver, not elsewhere classified: Secondary | ICD-10-CM | POA: Diagnosis not present

## 2018-08-25 DIAGNOSIS — I4891 Unspecified atrial fibrillation: Secondary | ICD-10-CM | POA: Diagnosis not present

## 2018-08-25 DIAGNOSIS — E538 Deficiency of other specified B group vitamins: Secondary | ICD-10-CM

## 2018-08-25 DIAGNOSIS — C50919 Malignant neoplasm of unspecified site of unspecified female breast: Secondary | ICD-10-CM

## 2018-08-25 DIAGNOSIS — R5383 Other fatigue: Secondary | ICD-10-CM

## 2018-08-25 MED ORDER — OMEPRAZOLE 20 MG PO CPDR: 20.0000 mg | DELAYED_RELEASE_CAPSULE | Freq: Every day | ORAL | 3 refills | Status: DC

## 2018-08-25 NOTE — Patient Instructions (Addendum)
You have a Cerumen (wax buildup)  impaction in the right ear.  I recommend that you Use Debrox drops at night in right  Ear   Nightly for a few days,  And return for an irrigation   Just call if you want to have    Your blood pressure is fine.    I have refilled your omeprazole through mail order

## 2018-08-25 NOTE — Progress Notes (Signed)
Subjective:  Patient ID: Carol [name], female    DOB: [birth year]  Age: 77 y.o. MRN: [id number]  CC: The primary encounter diagnosis was Vitamin D deficiency. Diagnoses of Hyperlipidemia LDL goal <100, Hepatic steatosis, Fatigue, unspecified type, B12 deficiency, Hearing loss due to cerumen impaction, right, Atrial fibrillation with rapid ventricular response ([place]), Malignant neoplasm of female breast, unspecified estrogen receptor status, unspecified laterality, unspecified site of breast ([place]), and Pure hypercholesterolemia were also pertinent to this visit.  HPI Carol [name] presents for 6 MONTH FOLLOW UP ON breast cancer,  Atrial fibrillation , d hyperlipidemia. and other chronic  issues .    Patient is taking her medications as prescribed and notes no adverse effects.  Home BP readings have been done about once per week and are  generally < 130/80 .  She is avoiding added salt in her diet and walking regularly about 3 times per week for exercise  .  TREATED FOR BRONCHITIS and persistent ear pain  Leftover from sinusitis.  Treated with doxyI N LATE September BY [facility] .  Cough has resolved,  Still has pressure in ear   ORBITAL LYMPHOMA MANAGED BY [facility]    Treated in in early September  for sinusitis with augmentin .  NO RECURRENCE OF RIGHT PARASINUS MASS.  S/P XRT.  ONE YEAR FOLLOW UP    [facility]amm[facility] at [facility]   One more year of tamoxifen .  Dizziness   Taking tikosyn by [facility]  For  A fib control. She remains in NSR   OMEPRAZOLE STOPPED BY [facility] due to concern for interactoin . GERD not controlled on  H2 blockers.  Has resume omeprazole.  Outpatient Medications Prior to Visit  Medication Sig Dispense Refill  . albuterol (PROVENTIL HFA;VENTOLIN HFA) 108 (90 BASE) MCG/ACT inhaler Inhale 2 puffs into the lungs every 6 (six) hours as needed. 18 g 0  . Bisacodyl (LAXATIVE PO) Take 1 tablet by mouth daily as needed. Reported on 11/28/2015    . diltiazem (CARDIZEM) 60 MG tablet Take  1 tablet (60 mg total) by mouth as needed. 90 tablet 1  . diphenhydrAMINE (BENADRYL) 25 mg capsule Take 25 mg by mouth every 6 (six) hours as needed.    . dofetilide (TIKOSYN) 250 MCG capsule Take 250 mcg by mouth 2 (two) times daily.    . furosemide (LASIX) 20 MG tablet Take 1 tablet (20 mg total) by mouth daily. 90 tablet 1  . ibuprofen (ADVIL,MOTRIN) 200 MG tablet Take 200 mg by mouth every 6 (six) hours as needed.    . Magnesium 400 MG CAPS Take 1 capsule by mouth 2 (two) times daily. 180 capsule 3  . metoprolol succinate (TOPROL-XL) 50 MG 24 hr tablet Take 25 mg by mouth daily.    . tamoxifen (NOLVADEX) 20 MG tablet Take 20 mg by mouth daily.    . Tdap (BOOSTRIX) 5-2.5-18.5 LF-MCG/0.5 injection Inject 0.5 mLs into the muscle once. 0.5 mL 0  . warfarin (COUMADIN) 1 MG tablet Take 1 mg by mouth daily.    Marland Kitchen warfarin (COUMADIN) 6 MG tablet Take 6 mg by mouth daily.    . benzonatate (TESSALON) 100 MG capsule Take 1 capsule (100 mg total) by mouth 2 (two) times daily as needed for cough. 20 capsule 0  . amoxicillin-clavulanate (AUGMENTIN) 875-125 MG tablet Take 1 tablet by mouth 2 (two) times daily. (Patient not taking: Reported on 08/25/2018) 20 tablet 0  . doxycycline (VIBRA-TABS) 100 MG tablet Take 1 tablet (100  mg total) by mouth 2 (two) times daily. (Patient not taking: Reported on 08/25/2018) 20 tablet 0   No facility-administered medications prior to visit.     Review of Systems;  Patient denies headache, fevers, malaise, unintentional weight loss, skin rash, eye pain, sinus congestion and sinus pain, sore throat, dysphagia,  hemoptysis , cough, dyspnea, wheezing, chest pain, palpitations, orthopnea, edema, abdominal pain, nausea, melena, diarrhea, constipation, flank pain, dysuria, hematuria, urinary  Frequency, nocturia, numbness, tingling, seizures,  Focal weakness, Loss of consciousness,  Tremor, insomnia, depression, anxiety, and suicidal ideation.      Objective:  BP 116/72 (BP  Location: Left Arm, Patient Position: Sitting, Cuff Size: Normal)   Pulse 82   Temp 98.2 F (36.8 C) (Oral)   Resp 15   Ht 5\' 6"  (1.676 m)   Wt 175 lb (79.4 kg)   SpO2 95%   BMI 28.25 kg/m   BP Readings from Last 3 Encounters:  08/25/18 116/72  07/30/18 140/88  02/26/18 120/70    Wt Readings from Last 3 Encounters:  08/25/18 175 lb (79.4 kg)  07/30/18 176 lb 12.8 oz (80.2 kg)  02/26/18 172 lb 4 oz (78.1 kg)    General appearance: alert, cooperative and appears stated age Ears: normal TM's and external ear canals both ears Throat: lips, mucosa, and tongue normal; teeth and gums normal Neck: no adenopathy, no carotid bruit, supple, symmetrical, trachea midline and thyroid not enlarged, symmetric, no tenderness/mass/nodules Back: symmetric, no curvature. ROM normal. No CVA tenderness. Lungs: clear to auscultation bilaterally Heart: regular rate and rhythm, S1, S2 normal, no murmur, click, rub or gallop Abdomen: soft, non-tender; bowel sounds normal; no masses,  no organomegaly Pulses: 2+ and symmetric Skin: Skin color, texture, turgor normal. No rashes or lesions Lymph nodes: Cervical, supraclavicular, and axillary nodes normal.  Lab Results  Component Value Date   HGBA1C 5.7 05/27/2016    Lab Results  Component Value Date   CREATININE 0.91 08/25/2018   CREATININE 0.89 09/29/2017   CREATININE 0.94 07/11/2017    Lab Results  Component Value Date   WBC 5.3 09/29/2017   HGB 13.8 09/29/2017   HCT 39.9 09/29/2017   PLT 243 09/29/2017   GLUCOSE 89 08/25/2018   CHOL 201 (H) 07/11/2017   TRIG 216.0 (H) 07/11/2017   HDL 52.10 07/11/2017   LDLDIRECT 123.0 08/25/2018   LDLCALC 125 (H) 11/27/2016   ALT 28 08/25/2018   AST 26 08/25/2018   NA 136 08/25/2018   K 4.9 08/25/2018   CL 99 08/25/2018   CREATININE 0.91 08/25/2018   BUN 14 08/25/2018   CO2 27 08/25/2018   TSH 1.85 08/25/2018   INR 2.07 09/29/2017   HGBA1C 5.7 05/27/2016    US Carotid Duplex  Bilateral  Result Date: 10/23/2017 CLINICAL DATA:  TIA symptoms EXAM: BILATERAL CAROTID DUPLEX ULTRASOUND TECHNIQUE: [name] scale imaging, color Doppler and duplex ultrasound were performed of bilateral carotid and vertebral arteries in the neck. COMPARISON:  None. FINDINGS: Criteria: Quantification of carotid stenosis is based on velocity parameters that correlate the residual internal carotid diameter with NASCET-based stenosis levels, using the diameter of the distal internal carotid lumen as the denominator for stenosis measurement. The following velocity measurements were obtained: RIGHT ICA:  106/34 cm/sec CCA:  78/46 cm/sec SYSTOLIC ICA/CCA RATIO:  1.2 DIASTOLIC ICA/CCA RATIO:  1.4 ECA:  129 cm/sec LEFT ICA:  83/36 cm/sec CCA:  96/29 cm/sec SYSTOLIC ICA/CCA RATIO:  1.1 DIASTOLIC ICA/CCA RATIO:  1.5 ECA:  89 cm/sec RIGHT CAROTID ARTERY:  No significant atherosclerotic plaque formation. No hemodynamically significant right ICA stenosis, velocity elevation, or turbulent flow. Degree of narrowing less than 50%. RIGHT VERTEBRAL ARTERY:  Antegrade LEFT CAROTID ARTERY: No significant atherosclerotic plaque formation. No hemodynamically significant left ICA stenosis, velocity elevation, or turbulent flow. LEFT VERTEBRAL ARTERY:  Antegrade IMPRESSION: No significant carotid atherosclerosis. No hemodynamically significant ICA stenosis. Degree of narrowing estimated at less than 50% bilaterally by ultrasound criteria. Patent antegrade vertebral flow bilaterally Electronically Signed   By: Jerilynn Mages.  Shick M.D.   On: 10/23/2017 15:08    Assessment & Plan:   Problem List Items Addressed This Visit    Atrial fibrillation with rapid ventricular response [facility])    S/p ablation  , now managed with Tikosyn. Lytes normal  Lab Results  Component Value Date   NA 136 08/25/2018   K 4.9 08/25/2018   CL 99 08/25/2018   CO2 27 08/25/2018         Breast cancer ([place])    Found on screening mammogram done in July 2015.  S/p  lumpectomy, XRT, now on tamoxifen,  Managed by [facility] oncology Dr. Purcell Mouton.         Hearing loss due to cerumen impaction, right    Debrox followed by return for RN to irrigate recommended       Hepatic steatosis    Presumed by ultrasound changes and serologies negative for autoimmune causes of hepatitis.  Current liver enzymes are normal and all modifiable risk factors including obesity, diabetes and hyperlipidemia have been addressed .  Lab Results  Component Value Date   ALT 28 08/25/2018   AST 26 08/25/2018   ALKPHOS 48 08/25/2018   BILITOT 0.5 08/25/2018         Relevant Orders   Comprehensive metabolic panel (Completed)   Hyperlipidemia    Untreated secondary to Statin intolerance.  Direct LDL is 123  Lab Results  Component Value Date   CHOL 201 (H) 07/11/2017   HDL 52.10 07/11/2017   LDLCALC 125 (H) 11/27/2016   LDLDIRECT 123.0 08/25/2018   TRIG 216.0 (H) 07/11/2017   CHOLHDL 4 07/11/2017          Other Visit Diagnoses    Vitamin D deficiency    -  Primary   Relevant Orders   VITAMIN D 25 Hydroxy (Vit-D Deficiency, Fractures) (Completed)   Hyperlipidemia LDL goal <100       Relevant Orders   LDL cholesterol, direct (Completed)   Fatigue, unspecified type       Relevant Orders   TSH (Completed)   B12 deficiency       Relevant Orders   Vitamin B12 (Completed)    A total of 25 minutes of face to face time was spent with patient more than half of which was spent in counselling about the above mentioned conditions  and coordination of care  I have discontinued Quita Skye P. Cerros's amoxicillin-clavulanate, doxycycline, and benzonatate. I am also having her start on omeprazole. Additionally, I am having her maintain her warfarin, diphenhydrAMINE, Bisacodyl (LAXATIVE PO), ibuprofen, dofetilide, diltiazem, Magnesium, tamoxifen, Tdap, albuterol, warfarin, metoprolol succinate, and furosemide.  Meds ordered this encounter  Medications  . omeprazole (PRILOSEC) 20 MG  capsule    Sig: Take 1 capsule (20 mg total) by mouth daily.    Dispense:  90 capsule    Refill:  3    Medications Discontinued During This Encounter  Medication Reason  . amoxicillin-clavulanate (AUGMENTIN) 875-125 MG tablet Completed Course  . benzonatate (TESSALON) 100 MG  capsule   . doxycycline (VIBRA-TABS) 100 MG tablet Completed Course    Follow-up: Return in about 6 months (around 02/24/2019).   Crecencio Mc, MD

## 2018-08-25 NOTE — Assessment & Plan Note (Signed)
Debrox followed by return for RN to irrigate recommended

## 2018-08-26 LAB — COMPREHENSIVE METABOLIC PANEL
ALT: 28 U/L (ref 0–35)
AST: 26 U/L (ref 0–37)
Albumin: 4.3 g/dL (ref 3.5–5.2)
Alkaline Phosphatase: 48 U/L (ref 39–117)
BUN: 14 mg/dL (ref 6–23)
CO2: 27 mEq/L (ref 19–32)
Calcium: 9.5 mg/dL (ref 8.4–10.5)
Chloride: 99 mEq/L (ref 96–112)
Creatinine, Ser: 0.91 mg/dL (ref 0.40–1.20)
GFR: 63.55 mL/min (ref >60.00)
Glucose, Bld: 89 mg/dL (ref 70–99)
Potassium: 4.9 mEq/L (ref 3.5–5.1)
Sodium: 136 mEq/L (ref 135–145)
Total Bilirubin: 0.5 mg/dL (ref 0.2–1.2)
Total Protein: 6.8 g/dL (ref 6.0–8.3)

## 2018-08-26 LAB — VITAMIN D 25 HYDROXY (VIT D DEFICIENCY, FRACTURES): VITD: 50.48 ng/mL (ref 30.00–100.00)

## 2018-08-26 LAB — VITAMIN B12: Vitamin B-12: 859 pg/mL (ref 211–911)

## 2018-08-26 LAB — TSH: TSH: 1.85 u[IU]/mL (ref 0.35–4.50)

## 2018-08-26 LAB — LDL CHOLESTEROL, DIRECT: Direct LDL: 123 mg/dL

## 2018-08-26 NOTE — Assessment & Plan Note (Signed)
Untreated secondary to Statin intolerance.  Direct LDL is 123  Lab Results  Component Value Date   CHOL 201 (H) 07/11/2017   HDL 52.10 07/11/2017   LDLCALC 125 (H) 11/27/2016   LDLDIRECT 123.0 08/25/2018   TRIG 216.0 (H) 07/11/2017   CHOLHDL 4 07/11/2017    

## 2018-08-26 NOTE — Assessment & Plan Note (Signed)
S/p ablation  , now managed with Tikosyn. Lytes normal  Lab Results  Component Value Date   NA 136 08/25/2018   K 4.9 08/25/2018   CL 99 08/25/2018   CO2 27 08/25/2018

## 2018-08-26 NOTE — Assessment & Plan Note (Signed)
Found on screening mammogram done in July 2015.  S/p lumpectomy, XRT, now on tamoxifen,  Managed by [facility] oncology Dr. Purcell Mouton.

## 2018-08-26 NOTE — Assessment & Plan Note (Addendum)
Presumed by ultrasound changes and serologies negative for autoimmune causes of hepatitis.  Current liver enzymes are normal and all modifiable risk factors including obesity, diabetes and hyperlipidemia have been addressed .  Lab Results  Component Value Date   ALT 28 08/25/2018   AST 26 08/25/2018   ALKPHOS 48 08/25/2018   BILITOT 0.5 08/25/2018    

## 2018-08-28 ENCOUNTER — Encounter: Payer: Self-pay | Admitting: *Deleted

## 2018-09-03 DIAGNOSIS — Z79899 Other long term (current) drug therapy: Secondary | ICD-10-CM | POA: Diagnosis not present

## 2018-09-03 DIAGNOSIS — I480 Paroxysmal atrial fibrillation: Secondary | ICD-10-CM | POA: Diagnosis not present

## 2018-09-07 DIAGNOSIS — R928 Other abnormal and inconclusive findings on diagnostic imaging of breast: Secondary | ICD-10-CM | POA: Diagnosis not present

## 2018-09-07 DIAGNOSIS — Z170 Estrogen receptor positive status [ER+]: Secondary | ICD-10-CM | POA: Diagnosis not present

## 2018-09-07 DIAGNOSIS — C50511 Malignant neoplasm of lower-outer quadrant of right female breast: Secondary | ICD-10-CM | POA: Diagnosis not present

## 2018-09-09 DIAGNOSIS — Z9221 Personal history of antineoplastic chemotherapy: Secondary | ICD-10-CM | POA: Diagnosis not present

## 2018-09-09 DIAGNOSIS — J329 Chronic sinusitis, unspecified: Secondary | ICD-10-CM | POA: Diagnosis not present

## 2018-09-09 DIAGNOSIS — Z170 Estrogen receptor positive status [ER+]: Secondary | ICD-10-CM | POA: Diagnosis not present

## 2018-09-09 DIAGNOSIS — C50311 Malignant neoplasm of lower-inner quadrant of right female breast: Secondary | ICD-10-CM | POA: Diagnosis not present

## 2018-09-09 DIAGNOSIS — C8311 Mantle cell lymphoma, lymph nodes of head, face, and neck: Secondary | ICD-10-CM | POA: Diagnosis not present

## 2018-09-09 DIAGNOSIS — C8599 Non-Hodgkin lymphoma, unspecified, extranodal and solid organ sites: Secondary | ICD-10-CM | POA: Diagnosis not present

## 2018-09-09 DIAGNOSIS — Z23 Encounter for immunization: Secondary | ICD-10-CM | POA: Diagnosis not present

## 2018-09-16 DIAGNOSIS — R791 Abnormal coagulation profile: Secondary | ICD-10-CM | POA: Diagnosis not present

## 2018-10-19 DIAGNOSIS — I480 Paroxysmal atrial fibrillation: Secondary | ICD-10-CM | POA: Diagnosis not present

## 2018-11-09 DIAGNOSIS — D2262 Melanocytic nevi of left upper limb, including shoulder: Secondary | ICD-10-CM | POA: Diagnosis not present

## 2018-11-09 DIAGNOSIS — D2272 Melanocytic nevi of left lower limb, including hip: Secondary | ICD-10-CM | POA: Diagnosis not present

## 2018-11-09 DIAGNOSIS — D225 Melanocytic nevi of trunk: Secondary | ICD-10-CM | POA: Diagnosis not present

## 2018-11-09 DIAGNOSIS — Z08 Encounter for follow-up examination after completed treatment for malignant neoplasm: Secondary | ICD-10-CM | POA: Diagnosis not present

## 2018-11-09 DIAGNOSIS — Z85828 Personal history of other malignant neoplasm of skin: Secondary | ICD-10-CM | POA: Diagnosis not present

## 2018-11-09 DIAGNOSIS — L84 Corns and callosities: Secondary | ICD-10-CM | POA: Diagnosis not present

## 2018-11-09 DIAGNOSIS — L089 Local infection of the skin and subcutaneous tissue, unspecified: Secondary | ICD-10-CM | POA: Diagnosis not present

## 2018-11-09 DIAGNOSIS — D2261 Melanocytic nevi of right upper limb, including shoulder: Secondary | ICD-10-CM | POA: Diagnosis not present

## 2018-11-09 DIAGNOSIS — D2271 Melanocytic nevi of right lower limb, including hip: Secondary | ICD-10-CM | POA: Diagnosis not present

## 2018-11-10 DIAGNOSIS — B0231 Zoster conjunctivitis: Secondary | ICD-10-CM | POA: Diagnosis not present

## 2018-11-18 DIAGNOSIS — B0231 Zoster conjunctivitis: Secondary | ICD-10-CM | POA: Diagnosis not present

## 2018-11-23 DIAGNOSIS — I480 Paroxysmal atrial fibrillation: Secondary | ICD-10-CM | POA: Diagnosis not present

## 2018-12-02 DIAGNOSIS — B0231 Zoster conjunctivitis: Secondary | ICD-10-CM | POA: Diagnosis not present

## 2018-12-31 DIAGNOSIS — E782 Mixed hyperlipidemia: Secondary | ICD-10-CM | POA: Diagnosis not present

## 2018-12-31 DIAGNOSIS — I059 Rheumatic mitral valve disease, unspecified: Secondary | ICD-10-CM | POA: Diagnosis not present

## 2018-12-31 DIAGNOSIS — I10 Essential (primary) hypertension: Secondary | ICD-10-CM | POA: Diagnosis not present

## 2018-12-31 DIAGNOSIS — I4891 Unspecified atrial fibrillation: Secondary | ICD-10-CM | POA: Diagnosis not present

## 2018-12-31 DIAGNOSIS — I341 Nonrheumatic mitral (valve) prolapse: Secondary | ICD-10-CM | POA: Diagnosis not present

## 2018-12-31 DIAGNOSIS — I480 Paroxysmal atrial fibrillation: Secondary | ICD-10-CM | POA: Diagnosis not present

## 2018-12-31 DIAGNOSIS — I493 Ventricular premature depolarization: Secondary | ICD-10-CM | POA: Diagnosis not present

## 2019-01-27 DIAGNOSIS — I10 Essential (primary) hypertension: Secondary | ICD-10-CM | POA: Diagnosis not present

## 2019-01-27 DIAGNOSIS — I341 Nonrheumatic mitral (valve) prolapse: Secondary | ICD-10-CM | POA: Diagnosis not present

## 2019-01-27 DIAGNOSIS — I493 Ventricular premature depolarization: Secondary | ICD-10-CM | POA: Diagnosis not present

## 2019-01-27 DIAGNOSIS — I480 Paroxysmal atrial fibrillation: Secondary | ICD-10-CM | POA: Diagnosis not present

## 2019-01-27 DIAGNOSIS — I059 Rheumatic mitral valve disease, unspecified: Secondary | ICD-10-CM | POA: Diagnosis not present

## 2019-01-29 ENCOUNTER — Ambulatory Visit: Payer: No Typology Code available for payment source

## 2019-02-01 DIAGNOSIS — L309 Dermatitis, unspecified: Secondary | ICD-10-CM | POA: Diagnosis not present

## 2019-02-10 DIAGNOSIS — L309 Dermatitis, unspecified: Secondary | ICD-10-CM | POA: Diagnosis not present

## 2019-02-11 ENCOUNTER — Ambulatory Visit: Payer: No Typology Code available for payment source

## 2019-02-24 ENCOUNTER — Ambulatory Visit (INDEPENDENT_AMBULATORY_CARE_PROVIDER_SITE_OTHER): Payer: Medicare Other | Admitting: Internal Medicine

## 2019-02-24 ENCOUNTER — Other Ambulatory Visit: Payer: Self-pay

## 2019-02-24 DIAGNOSIS — E7800 Pure hypercholesterolemia, unspecified: Secondary | ICD-10-CM

## 2019-02-24 DIAGNOSIS — K760 Fatty (change of) liver, not elsewhere classified: Secondary | ICD-10-CM | POA: Diagnosis not present

## 2019-02-24 DIAGNOSIS — C8591 Non-Hodgkin lymphoma, unspecified, lymph nodes of head, face, and neck: Secondary | ICD-10-CM | POA: Diagnosis not present

## 2019-02-24 DIAGNOSIS — C50919 Malignant neoplasm of unspecified site of unspecified female breast: Secondary | ICD-10-CM

## 2019-02-24 DIAGNOSIS — L710 Perioral dermatitis: Secondary | ICD-10-CM | POA: Diagnosis not present

## 2019-02-24 DIAGNOSIS — Z79899 Other long term (current) drug therapy: Secondary | ICD-10-CM | POA: Diagnosis not present

## 2019-02-24 MED ORDER — FUROSEMIDE 20 MG PO TABS: 20.0000 mg | ORAL_TABLET | Freq: Every day | ORAL | 1 refills | Status: DC

## 2019-02-24 NOTE — Progress Notes (Signed)
Telephone Visit   This visit type was conducted due to national recommendations for restrictions regarding the COVID-19 pandemic (e.g. social distancing).  This format is felt to be most appropriate for this patient at this time.  All issues noted in this document were discussed and addressed.  No physical exam was performed (except for noted visual exam findings with Video Visits).   I connected with@ on 02/27/19 at  3:00 PM EDT by telephone and verified that I am speaking with the correct person using two identifiers. Location patient: home Location provider: work or home office Persons participating in the virtual visit: patient, provider  I discussed the limitations, risks, security and privacy concerns of performing an evaluation and management service by telephone and the availability of in person appointments. I also discussed with the patient that there may be a patient responsible charge related to this service. The patient expressed understanding and agreed to proceed.  Reason for visit: follow up on chronic conditions   HPI:  78 yr old female with history of orbital lymphoma and right sided  breast cancer presents for follow up.  She has been recovering from a left periorbital rash  That was treated initially for shingles outbreak by her dermatologist and sent for urgent ophthalmologic evaluation.  Therapy was changed to doxycycline after rash worsened despite change in therapy to erythromycin (topical) , followed by polymyxin,  And hydrocortisone and culture ruled out herpes.  Her facial rash is reportedly severe but improving.  She is taking a probiotiic   Atrial fib:  She had a cardiology follow up with Dr Nehemiah Massed and Phyllis Ginger was continued for anti arrhythmic therapy.  ECHO repeated  EF 45%   BRCA:  S/p lumpectomy 8/15, XRT Nov 2015 tamoxifen  She had a mammogram in November,  And is scheduled to have a 6 month follow up  On calcifications noted at the surgical site   NASH:  She is  avoiding liver toxic meds and alcohol.  Following a low glycemic index diet      ROS: See pertinent positives and negatives per HPI.  Past Medical History:  Diagnosis Date  . Chronic sinusitis   . Fibrocystic breast disease   . History of pneumonia 1999  . Hyperlipidemia   . Irritable bowel syndrome   . Lichen planus   . 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 Mild tricuspid insufficiency Jan 2012   ECHO, [place]  . Moderate mitral insufficiency JAN 2012   ECHO, [place]    Past Surgical History:  Procedure Laterality Date  . ABDOMINAL HYSTERECTOMY     precancerous cervix,    . ABLATION OF DYSRHYTHMIC FOCUS  August 2013   [facility], Dr. Marcello Moores  . BREAST SURGERY     bilateral, benign biopsies  . MAXILLARY SINUS LIFT  1990's   [name]  . oophorectomy    . squamous cell removal Right    right leg calf area.    Family History  Problem Relation Age of Onset  . Cancer Mother        Breast  . Hyperlipidemia Father   . Heart disease Father   . Hypertension Father   . Kidney disease Father   . Cancer Maternal Grandfather        colon CA    SOCIAL HX: married,  Retired,  IADL .  No tobacco   Current Outpatient Medications:  .  albuterol (PROVENTIL HFA;VENTOLIN HFA) 108 (90 BASE) MCG/ACT inhaler, Inhale 2 puffs  into the lungs every 6 (six) hours as needed., Disp: 18 g, Rfl: 0 .  Bisacodyl (LAXATIVE PO), Take 1 tablet by mouth daily as needed. Reported on 11/28/2015, Disp: , Rfl:  .  diltiazem (CARDIZEM) 60 MG tablet, Take 1 tablet (60 mg total) by mouth as needed., Disp: 90 tablet, Rfl: 1 .  diphenhydrAMINE (BENADRYL) 25 mg capsule, Take 25 mg by mouth every 6 (six) hours as needed., Disp: , Rfl:  .  dofetilide (TIKOSYN) 250 MCG capsule, Take 250 mcg by mouth 2 (two) times daily., Disp: , Rfl:  .  doxycycline (VIBRAMYCIN) 100 MG capsule, TAKE 1 CAPSULE BY MOUTH ONCE DAILY WITH FOOD AND A FULL GLASS OF WATER, Disp: , Rfl:  .  furosemide (LASIX) 20 MG tablet, Take 1  tablet (20 mg total) by mouth daily., Disp: 90 tablet, Rfl: 1 .  ibuprofen (ADVIL,MOTRIN) 200 MG tablet, Take 200 mg by mouth every 6 (six) hours as needed., Disp: , Rfl:  .  Magnesium 400 MG CAPS, Take 1 capsule by mouth 2 (two) times daily., Disp: 180 capsule, Rfl: 3 .  metoprolol succinate (TOPROL-XL) 50 MG 24 hr tablet, Take 25 mg by mouth daily., Disp: , Rfl:  .  omeprazole (PRILOSEC) 20 MG capsule, Take 1 capsule (20 mg total) by mouth daily., Disp: 90 capsule, Rfl: 3 .  tamoxifen (NOLVADEX) 20 MG tablet, Take 20 mg by mouth daily., Disp: , Rfl:  .  warfarin (COUMADIN) 1 MG tablet, Take 1 mg by mouth daily., Disp: , Rfl:  .  warfarin (COUMADIN) 6 MG tablet, Take 6 mg by mouth daily., Disp: , Rfl:   EXAM:   General impression: alert, cooperative and articulate.  No signs of being in distress  Lungs: speech is fluent,  sentence length suggests that patient is not short of breath and not punctuated by cough, sneezing or sniffling. Marland Kitchen   Psych: affect normal.  speech is articulate and non pressured .  Denies suicidal thoughts    ASSESSMENT AND PLAN:  Discussed the following assessment and plan:  Long-term use of high-risk medication - Plan: Comprehensive metabolic panel, Magnesium  Malignant neoplasm of female breast, unspecified estrogen receptor status, unspecified laterality, unspecified site of breast ([place])  Lymphoma of lymph nodes of head, face, and/or neck ([facility])  Hepatic steatosis - Plan: Lipid panel  Pure hypercholesterolemia  Breast cancer ([place]) 6 month follow up on calcifications noted on prior mammogram in November 2019.  Continue tamoxifen for a total of 5 years   Lymphoma of lymph nodes of head, face, and/or neck Managed by [facility] Onc [place],   Hepatic steatosis Presumed by ultrasound changes and serologies negative for autoimmune causes of hepatitis.  Current liver enzymes are normal and all modifiable risk factors including obesity, diabetes and hyperlipidemia  have been addressed .  Lab Results  Component Value Date   ALT 28 08/25/2018   AST 26 08/25/2018   ALKPHOS 48 08/25/2018   BILITOT 0.5 08/25/2018     Hyperlipidemia Untreated secondary to Statin intolerance.  Direct LDL is 123  Lab Results  Component Value Date   CHOL 201 (H) 07/11/2017   HDL 52.10 07/11/2017   LDLCALC 125 (H) 11/27/2016   LDLDIRECT 123.0 08/25/2018   TRIG 216.0 (H) 07/11/2017   CHOLHDL 4 07/11/2017       I discussed the assessment and treatment plan with the patient. The patient was provided an opportunity to ask questions and all were answered. The patient agreed with the plan and  demonstrated an understanding of the instructions.   The patient was advised to call back or seek an in-person evaluation if the symptoms worsen or if the condition fails to improve as anticipated.  I provided 23 minutes of non-face-to-face time during this encounter.   Crecencio Mc, MD

## 2019-02-27 NOTE — Assessment & Plan Note (Signed)
Untreated secondary to Statin intolerance.  Direct LDL is 123  Lab Results  Component Value Date   CHOL 201 (H) 07/11/2017   HDL 52.10 07/11/2017   LDLCALC 125 (H) 11/27/2016   LDLDIRECT 123.0 08/25/2018   TRIG 216.0 (H) 07/11/2017   CHOLHDL 4 07/11/2017

## 2019-02-27 NOTE — Assessment & Plan Note (Signed)
Managed by [employer],

## 2019-02-27 NOTE — Assessment & Plan Note (Signed)
Presumed by ultrasound changes and serologies negative for autoimmune causes of hepatitis.  Current liver enzymes are normal and all modifiable risk factors including obesity, diabetes and hyperlipidemia have been addressed .  Lab Results  Component Value Date   ALT 28 08/25/2018   AST 26 08/25/2018   ALKPHOS 48 08/25/2018   BILITOT 0.5 08/25/2018

## 2019-02-27 NOTE — Assessment & Plan Note (Signed)
6 month follow up on calcifications noted on prior mammogram in November 2019.  Continue tamoxifen for a total of 5 years

## 2019-03-02 DIAGNOSIS — I480 Paroxysmal atrial fibrillation: Secondary | ICD-10-CM | POA: Diagnosis not present

## 2019-03-24 DIAGNOSIS — I480 Paroxysmal atrial fibrillation: Secondary | ICD-10-CM | POA: Diagnosis not present

## 2019-04-05 DIAGNOSIS — H26493 Other secondary cataract, bilateral: Secondary | ICD-10-CM | POA: Diagnosis not present

## 2019-04-16 ENCOUNTER — Ambulatory Visit (INDEPENDENT_AMBULATORY_CARE_PROVIDER_SITE_OTHER): Payer: Medicare Other

## 2019-04-16 ENCOUNTER — Other Ambulatory Visit: Payer: Self-pay

## 2019-04-16 DIAGNOSIS — Z0000 Encounter for general adult medical examination without abnormal findings: Secondary | ICD-10-CM

## 2019-04-16 NOTE — Patient Instructions (Addendum)
  Ms. Voshell , Thank you for taking time to come for your Medicare Wellness Visit. I appreciate your ongoing commitment to your health goals. Please review the following plan we discussed and let me know if I can assist you in the future.   These are the goals we discussed: Goals    . Portion controlled carbs       This is a list of the screening recommended for you and due dates:  Health Maintenance  Topic Date Due  . Flu Shot  06/05/2019  . Tetanus Vaccine  11/27/2021  . DEXA scan (bone density measurement)  Completed  . Pneumonia vaccines  Completed

## 2019-04-16 NOTE — Progress Notes (Signed)
Subjective:   Carol [name] is a 78 y.o. female who presents for Medicare Annual (Subsequent) preventive examination.  Review of Systems:  No ROS.  Medicare Wellness Virtual Visit.  Visual/audio telehealth visit, UTA vital signs.   See social history for additional risk factors.  Cardiac Risk Factors include: advanced age (>4men, >34 women)     Objective:     Vitals: There were no vitals taken for this visit.  There is no height or weight on file to calculate BMI.  Advanced Directives 04/16/2019 01/27/2018 12/26/2016 11/27/2015  Does Patient Have a Medical Advance Directive? Yes Yes Yes Yes  Type of Paramedic of [place];Living will [place];Living will [place];Living will [place];Living will  Does patient want to make changes to medical advance directive? No - Patient declined No - Patient declined No - Patient declined No - Patient declined  Copy of [place] in Chart? No - copy requested No - copy requested No - copy requested -    Tobacco Social History   Tobacco Use  Smoking Status Never Smoker  Smokeless Tobacco Never Used     Counseling given: Not Answered   Clinical Intake:  Pre-visit preparation completed: Yes        Diabetes: No  How often do you need to have someone help you when you read instructions, pamphlets, or other written materials from your doctor or pharmacy?: 1 - Never  Interpreter Needed?: No     Past Medical History:  Diagnosis Date  . Chronic sinusitis   . Fibrocystic breast disease   . History of pneumonia 1999  . Hyperlipidemia   . Irritable bowel syndrome   . Lichen planus   . 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 Mild tricuspid insufficiency Jan 2012   ECHO, [place]  . Moderate mitral insufficiency JAN 2012   ECHO, [place]   Past Surgical History:  Procedure Laterality Date  . ABDOMINAL HYSTERECTOMY      precancerous cervix,    . ABLATION OF DYSRHYTHMIC FOCUS  August 2013   [facility], Dr. Marcello Moores  . BREAST SURGERY     bilateral, benign biopsies  . MAXILLARY SINUS LIFT  1990's   Carol [name]  . oophorectomy    . squamous cell removal Right    right leg calf area.   Family History  Problem Relation Age of Onset  . Cancer Mother        Breast  . Hyperlipidemia Father   . Heart disease Father   . Hypertension Father   . Kidney disease Father   . Cancer Maternal Grandfather        colon CA  . Diverticulitis Sister    Social History   Socioeconomic History  . Marital status: Married    Spouse name: Not on file  . Number of children: Not on file  . Years of education: Not on file  . Highest education level: Not on file  Occupational History  . Not on file  Social Needs  . Financial resource strain: Not hard at all  . Food insecurity    Worry: Never true    Inability: Never true  . Transportation needs    Medical: No    Non-medical: No  Tobacco Use  . Smoking status: Never Smoker  . Smokeless tobacco: Never Used  Substance and Sexual Activity  . Alcohol use: Yes    Alcohol/week: 4.0 standard drinks  Types: 4 Glasses of wine per week    Comment: Occ.  . Drug use: No  . Sexual activity: Never  Lifestyle  . Physical activity    Days per week: 5 days    Minutes per session: 20 min  . Stress: Not at all  Relationships  . Social Herbalist on phone: Not on file    Gets together: Not on file    Attends religious service: Not on file    Active member of club or organization: Not on file    Attends meetings of clubs or organizations: Not on file    Relationship status: Not on file  Other Topics Concern  . Not on file  Social History Narrative  . Not on file    Outpatient Encounter Medications as of 04/16/2019  Medication Sig  . albuterol (PROVENTIL HFA;VENTOLIN HFA) 108 (90 BASE) MCG/ACT inhaler Inhale 2 puffs into the lungs every 6 (six) hours as  needed.  . Bisacodyl (LAXATIVE PO) Take 1 tablet by mouth daily as needed. Reported on 11/28/2015  . diltiazem (CARDIZEM) 60 MG tablet Take 1 tablet (60 mg total) by mouth as needed.  . diphenhydrAMINE (BENADRYL) 25 mg capsule Take 25 mg by mouth every 6 (six) hours as needed.  . dofetilide (TIKOSYN) 250 MCG capsule Take 250 mcg by mouth 2 (two) times daily.  . furosemide (LASIX) 20 MG tablet Take 1 tablet (20 mg total) by mouth daily.  Marland Kitchen ibuprofen (ADVIL,MOTRIN) 200 MG tablet Take 200 mg by mouth every 6 (six) hours as needed.  . Magnesium 400 MG CAPS Take 1 capsule by mouth 2 (two) times daily.  . metoprolol succinate (TOPROL-XL) 50 MG 24 hr tablet Take 25 mg by mouth daily.  Marland Kitchen omeprazole (PRILOSEC) 20 MG capsule Take 1 capsule (20 mg total) by mouth daily.  . tamoxifen (NOLVADEX) 20 MG tablet Take 20 mg by mouth daily.  Marland Kitchen warfarin (COUMADIN) 1 MG tablet Take 1 mg by mouth daily.  Marland Kitchen warfarin (COUMADIN) 6 MG tablet Take 6 mg by mouth daily.  . [DISCONTINUED] doxycycline (VIBRAMYCIN) 100 MG capsule TAKE 1 CAPSULE BY MOUTH ONCE DAILY WITH FOOD AND A FULL GLASS OF WATER  . [DISCONTINUED] levalbuterol (XOPENEX HFA) 45 MCG/ACT inhaler Inhale 1-2 puffs into the lungs every 4 (four) hours as needed for wheezing.   No facility-administered encounter medications on file as of 04/16/2019.     Activities of Daily Living In your present state of health, do you have any difficulty performing the following activities: 04/16/2019  Hearing? N  Vision? N  Difficulty concentrating or making decisions? N  Walking or climbing stairs? N  Dressing or bathing? N  Doing errands, shopping? N  Preparing Food and eating ? N  Using the Toilet? N  In the past six months, have you accidently leaked urine? N  Do you have problems with loss of bowel control? N  Managing your Medications? N  Managing your Finances? N  Housekeeping or managing your Housekeeping? N  Some recent data might be hidden    Patient Care  Team: Crecencio Mc, MD as PCP - General (Internal Medicine) Corey Skains, MD as Referring Physician (Internal Medicine)    Assessment:   This is a routine wellness examination for Carol [name].  I connected with patient 04/16/19 at 10:30 AM EDT by a video/audio enabled telemedicine application and verified that I am speaking with the correct person using two identifiers. Patient stated full name and DOB. Patient  gave permission to continue with virtual visit. Patient's location was at home and Nurse's location was at [place] office.   Health Screenings  Mammogram - 05/2018 Colonoscopy - 02/2007 Bone Density - 07/2017 Glaucoma -none Hearing -demonstrates normal hearing during visit. Labs followed by pcp. Dental- UTD Vision- visits within the last 12 months.  Social  Alcohol intake - yes      Smoking history- never   Smokers in home? none Illicit drug use? none Exercise - stretch, walking, mows the lawn, trampoline jumping. Diet - regular Sexually Active -never BMI- discussed the importance of a healthy diet, water intake and the benefits of aerobic exercise.  Educational material provided.   Safety  Patient feels safe at home- yes Patient does have smoke detectors at home- yes Patient does wear sunscreen or protective clothing when in direct sunlight -yes Patient does wear seat belt when in a moving vehicle -yes Patient drives -yes  [id number] precautions and sickness symptoms discussed.   Activities of Daily Living Patient denies needing assistance with: driving, household chores, feeding themselves, getting from bed to chair, getting to the toilet, bathing/showering, dressing, managing money, or preparing meals.  No new identified risk were noted.    Depression Screen Patient denies losing interest in daily life, feeling hopeless, or crying easily over simple problems.   Medication-taking as directed and without issues.   Fall Screen Patient denies being afraid of falling.  She had improper fitting shoes and lost her balance.  No medical follow up was needed. She wears more sensible shoes.   Memory Screen Patient is alert.  Patient denies difficulty focusing, concentrating or misplacing items. Correctly identified the president of the [place], season and recall. Patient likes to read, play computer brain games and work puzzles for brain stimulation.  Immunizations The following Immunizations were discussed: Influenza, shingles, pneumonia, and tetanus.   Other Providers Patient Care Team: Crecencio Mc, MD as PCP - General (Internal Medicine) Corey Skains, MD as Referring Physician (Internal Medicine)  Exercise Activities and Dietary recommendations Current Exercise Habits: Home exercise routine, Type of exercise: walking;stretching;calisthenics, Intensity: Mild  Goals    . Portion controlled carbs       Fall Risk Fall Risk  04/16/2019 01/27/2018 01/08/2018 12/26/2016 11/27/2015  Falls in the past year? 1 No No No Yes  Number falls in past yr: 0 - - - 1  Comment Improper shoe fitting, lost her balance. - - - -  Injury with Fall? - - - - No  Follow up - - - - Education provided;Falls prevention discussed   Depression Screen PHQ 2/9 Scores 04/16/2019 01/27/2018 01/08/2018 12/26/2016  PHQ - 2 Score 0 0 0 0  PHQ- 9 Score - - 0 -     Cognitive Function MMSE - Mini Mental State Exam 12/26/2016 11/27/2015  Orientation to time 5 5  Orientation to Place 5 5  Registration 3 3  Attention/ Calculation 4 5  Recall 2 3  Language- name 2 objects 2 2  Language- repeat 1 1  Language- follow 3 step command 3 3  Language- read & follow direction 1 1  Write a sentence 1 1  Copy design 1 1  Total score 28 30     6CIT Screen 04/16/2019 01/27/2018  What Year? 0 points 0 points  What month? 0 points 0 points  What time? 0 points 0 points  Count back from 20 0 points 0 points  Months in reverse 0 points 0 points  Repeat phrase -  0 points  Total Score - 0     Immunization History  Administered Date(s) Administered  . Influenza, High Dose Seasonal PF 09/26/2016  . Influenza, Seasonal, Injecte, Preservative Fre 11/24/2012  . Influenza,inj,Quad PF,6+ Mos 08/24/2013, 09/15/2014, 08/30/2015, 09/15/2017, 09/09/2018  . Influenza-Unspecified 08/04/2013, 08/07/2014, 09/19/2015  . Pneumococcal Conjugate-13 11/27/2015  . Pneumococcal Polysaccharide-23 11/05/2013  . Tdap 11/28/2011  . Zoster 05/17/2008    Screening Tests Health Maintenance  Topic Date Due  . INFLUENZA VACCINE  06/05/2019  . TETANUS/TDAP  11/27/2021  . DEXA SCAN  Completed  . PNA vac Low Risk Adult  Completed      Plan:    End of life planning; Advance aging; Advanced directives discussed.  Copy of current HCPOA/Living Will requested.    I have personally reviewed and noted the following in the patient's chart:   . Medical and social history . Use of alcohol, tobacco or illicit drugs  . Current medications and supplements . Functional ability and status . Nutritional status . Physical activity . Advanced directives . List of other physicians . Hospitalizations, surgeries, and ER visits in previous 12 months . Vitals . Screenings to include cognitive, depression, and falls . Referrals and appointments  In addition, I have reviewed and discussed with patient certain preventive protocols, quality metrics, and best practice recommendations. A written personalized care plan for preventive services as well as general preventive health recommendations were provided to patient.     OBrien-Blaney, Treven Holtman L, LPN  7/34/1938    I have reviewed the above information and agree with above.   [name], MD

## 2019-05-04 DIAGNOSIS — I480 Paroxysmal atrial fibrillation: Secondary | ICD-10-CM | POA: Diagnosis not present

## 2019-05-12 DIAGNOSIS — Z79899 Other long term (current) drug therapy: Secondary | ICD-10-CM | POA: Diagnosis not present

## 2019-05-12 DIAGNOSIS — Z7901 Long term (current) use of anticoagulants: Secondary | ICD-10-CM | POA: Diagnosis not present

## 2019-05-12 DIAGNOSIS — C8319 Mantle cell lymphoma, extranodal and solid organ sites: Secondary | ICD-10-CM | POA: Diagnosis not present

## 2019-05-12 DIAGNOSIS — Z79810 Long term (current) use of selective estrogen receptor modulators (SERMs): Secondary | ICD-10-CM | POA: Diagnosis not present

## 2019-05-12 DIAGNOSIS — C50311 Malignant neoplasm of lower-inner quadrant of right female breast: Secondary | ICD-10-CM | POA: Diagnosis not present

## 2019-05-12 DIAGNOSIS — I482 Chronic atrial fibrillation, unspecified: Secondary | ICD-10-CM | POA: Diagnosis not present

## 2019-05-12 DIAGNOSIS — Z853 Personal history of malignant neoplasm of breast: Secondary | ICD-10-CM | POA: Diagnosis not present

## 2019-05-12 DIAGNOSIS — M2550 Pain in unspecified joint: Secondary | ICD-10-CM | POA: Diagnosis not present

## 2019-05-12 DIAGNOSIS — Z9221 Personal history of antineoplastic chemotherapy: Secondary | ICD-10-CM | POA: Diagnosis not present

## 2019-05-12 DIAGNOSIS — Z08 Encounter for follow-up examination after completed treatment for malignant neoplasm: Secondary | ICD-10-CM | POA: Diagnosis not present

## 2019-05-12 DIAGNOSIS — C8599 Non-Hodgkin lymphoma, unspecified, extranodal and solid organ sites: Secondary | ICD-10-CM | POA: Diagnosis not present

## 2019-05-12 DIAGNOSIS — Z85828 Personal history of other malignant neoplasm of skin: Secondary | ICD-10-CM | POA: Diagnosis not present

## 2019-05-12 DIAGNOSIS — K7581 Nonalcoholic steatohepatitis (NASH): Secondary | ICD-10-CM | POA: Diagnosis not present

## 2019-05-12 DIAGNOSIS — R799 Abnormal finding of blood chemistry, unspecified: Secondary | ICD-10-CM | POA: Diagnosis not present

## 2019-05-12 DIAGNOSIS — R740 Nonspecific elevation of levels of transaminase and lactic acid dehydrogenase [LDH]: Secondary | ICD-10-CM | POA: Diagnosis not present

## 2019-05-12 DIAGNOSIS — Z170 Estrogen receptor positive status [ER+]: Secondary | ICD-10-CM | POA: Diagnosis not present

## 2019-05-20 DIAGNOSIS — Z5181 Encounter for therapeutic drug level monitoring: Secondary | ICD-10-CM | POA: Diagnosis not present

## 2019-05-20 DIAGNOSIS — I493 Ventricular premature depolarization: Secondary | ICD-10-CM | POA: Diagnosis not present

## 2019-05-20 DIAGNOSIS — Z79899 Other long term (current) drug therapy: Secondary | ICD-10-CM | POA: Diagnosis not present

## 2019-05-20 DIAGNOSIS — I480 Paroxysmal atrial fibrillation: Secondary | ICD-10-CM | POA: Diagnosis not present

## 2019-05-20 DIAGNOSIS — I10 Essential (primary) hypertension: Secondary | ICD-10-CM | POA: Diagnosis not present

## 2019-06-03 DIAGNOSIS — I480 Paroxysmal atrial fibrillation: Secondary | ICD-10-CM | POA: Diagnosis not present

## 2019-06-12 ENCOUNTER — Other Ambulatory Visit: Payer: Self-pay | Admitting: Internal Medicine

## 2019-07-07 DIAGNOSIS — I480 Paroxysmal atrial fibrillation: Secondary | ICD-10-CM | POA: Diagnosis not present

## 2019-08-09 DIAGNOSIS — I480 Paroxysmal atrial fibrillation: Secondary | ICD-10-CM | POA: Diagnosis not present

## 2019-08-10 DIAGNOSIS — D2271 Melanocytic nevi of right lower limb, including hip: Secondary | ICD-10-CM | POA: Diagnosis not present

## 2019-08-10 DIAGNOSIS — L710 Perioral dermatitis: Secondary | ICD-10-CM | POA: Diagnosis not present

## 2019-08-10 DIAGNOSIS — D2272 Melanocytic nevi of left lower limb, including hip: Secondary | ICD-10-CM | POA: Diagnosis not present

## 2019-08-10 DIAGNOSIS — Z85828 Personal history of other malignant neoplasm of skin: Secondary | ICD-10-CM | POA: Diagnosis not present

## 2019-08-10 DIAGNOSIS — L570 Actinic keratosis: Secondary | ICD-10-CM | POA: Diagnosis not present

## 2019-08-10 DIAGNOSIS — X32XXXA Exposure to sunlight, initial encounter: Secondary | ICD-10-CM | POA: Diagnosis not present

## 2019-08-10 DIAGNOSIS — D225 Melanocytic nevi of trunk: Secondary | ICD-10-CM | POA: Diagnosis not present

## 2019-08-10 DIAGNOSIS — D2261 Melanocytic nevi of right upper limb, including shoulder: Secondary | ICD-10-CM | POA: Diagnosis not present

## 2019-08-10 DIAGNOSIS — Z08 Encounter for follow-up examination after completed treatment for malignant neoplasm: Secondary | ICD-10-CM | POA: Diagnosis not present

## 2019-08-10 DIAGNOSIS — L820 Inflamed seborrheic keratosis: Secondary | ICD-10-CM | POA: Diagnosis not present

## 2019-08-10 DIAGNOSIS — D2262 Melanocytic nevi of left upper limb, including shoulder: Secondary | ICD-10-CM | POA: Diagnosis not present

## 2019-08-31 DIAGNOSIS — I480 Paroxysmal atrial fibrillation: Secondary | ICD-10-CM | POA: Diagnosis not present

## 2019-09-15 DIAGNOSIS — Z23 Encounter for immunization: Secondary | ICD-10-CM | POA: Diagnosis not present

## 2019-09-16 DIAGNOSIS — Z5181 Encounter for therapeutic drug level monitoring: Secondary | ICD-10-CM | POA: Diagnosis not present

## 2019-09-16 DIAGNOSIS — Z79899 Other long term (current) drug therapy: Secondary | ICD-10-CM | POA: Diagnosis not present

## 2019-09-16 DIAGNOSIS — I480 Paroxysmal atrial fibrillation: Secondary | ICD-10-CM | POA: Diagnosis not present

## 2019-09-23 ENCOUNTER — Other Ambulatory Visit: Payer: Self-pay | Admitting: Internal Medicine

## 2019-10-04 DIAGNOSIS — I480 Paroxysmal atrial fibrillation: Secondary | ICD-10-CM | POA: Diagnosis not present

## 2019-11-15 DIAGNOSIS — Z7901 Long term (current) use of anticoagulants: Secondary | ICD-10-CM | POA: Diagnosis not present

## 2019-11-15 DIAGNOSIS — Z170 Estrogen receptor positive status [ER+]: Secondary | ICD-10-CM | POA: Diagnosis not present

## 2019-11-15 DIAGNOSIS — Z853 Personal history of malignant neoplasm of breast: Secondary | ICD-10-CM | POA: Diagnosis not present

## 2019-11-15 DIAGNOSIS — J329 Chronic sinusitis, unspecified: Secondary | ICD-10-CM | POA: Diagnosis not present

## 2019-11-15 DIAGNOSIS — C50311 Malignant neoplasm of lower-inner quadrant of right female breast: Secondary | ICD-10-CM | POA: Diagnosis not present

## 2019-11-15 DIAGNOSIS — I482 Chronic atrial fibrillation, unspecified: Secondary | ICD-10-CM | POA: Diagnosis not present

## 2019-11-15 DIAGNOSIS — Z79899 Other long term (current) drug therapy: Secondary | ICD-10-CM | POA: Diagnosis not present

## 2019-11-15 DIAGNOSIS — Z9221 Personal history of antineoplastic chemotherapy: Secondary | ICD-10-CM | POA: Diagnosis not present

## 2019-11-15 DIAGNOSIS — Z923 Personal history of irradiation: Secondary | ICD-10-CM | POA: Diagnosis not present

## 2019-11-15 DIAGNOSIS — E7439 Other disorders of intestinal carbohydrate absorption: Secondary | ICD-10-CM | POA: Diagnosis not present

## 2019-11-15 DIAGNOSIS — Z9011 Acquired absence of right breast and nipple: Secondary | ICD-10-CM | POA: Diagnosis not present

## 2019-11-15 DIAGNOSIS — Z08 Encounter for follow-up examination after completed treatment for malignant neoplasm: Secondary | ICD-10-CM | POA: Diagnosis not present

## 2019-11-15 DIAGNOSIS — C884 Extranodal marginal zone B-cell lymphoma of mucosa-associated lymphoid tissue [MALT-lymphoma]: Secondary | ICD-10-CM | POA: Diagnosis not present

## 2019-11-15 DIAGNOSIS — C8599 Non-Hodgkin lymphoma, unspecified, extranodal and solid organ sites: Secondary | ICD-10-CM | POA: Diagnosis not present

## 2019-11-15 DIAGNOSIS — Z85828 Personal history of other malignant neoplasm of skin: Secondary | ICD-10-CM | POA: Diagnosis not present

## 2019-11-15 DIAGNOSIS — K7581 Nonalcoholic steatohepatitis (NASH): Secondary | ICD-10-CM | POA: Diagnosis not present

## 2019-12-07 DIAGNOSIS — I480 Paroxysmal atrial fibrillation: Secondary | ICD-10-CM | POA: Diagnosis not present

## 2019-12-09 ENCOUNTER — Telehealth: Payer: Self-pay | Admitting: Internal Medicine

## 2019-12-09 MED ORDER — FUROSEMIDE 20 MG PO TABS: 20.0000 mg | ORAL_TABLET | Freq: Every day | ORAL | 0 refills | Status: DC

## 2019-12-09 MED ORDER — OMEPRAZOLE 20 MG PO CPDR: 20.0000 mg | DELAYED_RELEASE_CAPSULE | Freq: Every day | ORAL | 0 refills | Status: DC

## 2019-12-09 NOTE — Telephone Encounter (Signed)
Medication has been refilled.

## 2019-12-09 NOTE — Telephone Encounter (Signed)
Pt needs a refill on the following to go to [place] order omeprazole (PRILOSEC) 20 MG capsule furosemide (LASIX) 20 MG tablet

## 2020-01-05 DIAGNOSIS — I480 Paroxysmal atrial fibrillation: Secondary | ICD-10-CM | POA: Diagnosis not present

## 2020-01-05 DIAGNOSIS — Z5181 Encounter for therapeutic drug level monitoring: Secondary | ICD-10-CM | POA: Diagnosis not present

## 2020-01-05 DIAGNOSIS — Z79899 Other long term (current) drug therapy: Secondary | ICD-10-CM | POA: Diagnosis not present

## 2020-01-06 ENCOUNTER — Other Ambulatory Visit
Admission: RE | Admit: 2020-01-06 | Discharge: 2020-01-06 | Disposition: A | Discharge status: Home / Self Care | Payer: Medicare Other | Attending: Ophthalmology | Admitting: Ophthalmology

## 2020-01-06 DIAGNOSIS — G4452 New daily persistent headache (NDPH): Secondary | ICD-10-CM | POA: Diagnosis not present

## 2020-01-06 DIAGNOSIS — I480 Paroxysmal atrial fibrillation: Secondary | ICD-10-CM | POA: Insufficient documentation

## 2020-01-06 LAB — CBC WITH DIFFERENTIAL/PLATELET
Abs Immature Granulocytes: 0.01 10*3/uL (ref 0.00–0.07)
Basophils Absolute: 0.1 10*3/uL (ref 0.0–0.1)
Basophils Relative: 1 %
Eosinophils Absolute: 0.1 10*3/uL (ref 0.0–0.5)
Eosinophils Relative: 2 %
HCT: 39.1 % (ref 36.0–46.0)
Hemoglobin: 13.2 g/dL (ref 12.0–15.0)
Immature Granulocytes: 0 %
Lymphocytes Relative: 35 %
Lymphs Abs: 2.1 10*3/uL (ref 0.7–4.0)
MCH: 31.9 pg (ref 26.0–34.0)
MCHC: 33.8 g/dL (ref 30.0–36.0)
MCV: 94.4 fL (ref 80.0–100.0)
Monocytes Absolute: 0.6 10*3/uL (ref 0.1–1.0)
Monocytes Relative: 10 %
Neutro Abs: 3.2 10*3/uL (ref 1.7–7.7)
Neutrophils Relative %: 52 %
Platelets: 287 10*3/uL (ref 150–400)
RBC: 4.14 MIL/uL (ref 3.87–5.11)
RDW: 12.4 % (ref 11.5–15.5)
WBC: 6.1 10*3/uL (ref 4.0–10.5)
nRBC: 0 % (ref 0.0–0.2)

## 2020-01-06 LAB — C-REACTIVE PROTEIN: CRP: 0.7 mg/dL (ref <1.0)

## 2020-01-06 LAB — SEDIMENTATION RATE: Sed Rate: 12 mm/hr (ref 0–30)

## 2020-01-12 DIAGNOSIS — Z5181 Encounter for therapeutic drug level monitoring: Secondary | ICD-10-CM | POA: Diagnosis not present

## 2020-01-12 DIAGNOSIS — I480 Paroxysmal atrial fibrillation: Secondary | ICD-10-CM | POA: Diagnosis not present

## 2020-01-12 DIAGNOSIS — Z79899 Other long term (current) drug therapy: Secondary | ICD-10-CM | POA: Diagnosis not present

## 2020-01-12 DIAGNOSIS — I10 Essential (primary) hypertension: Secondary | ICD-10-CM | POA: Diagnosis not present

## 2020-02-09 ENCOUNTER — Other Ambulatory Visit: Payer: Self-pay | Admitting: Internal Medicine

## 2020-02-10 DIAGNOSIS — Z7901 Long term (current) use of anticoagulants: Secondary | ICD-10-CM | POA: Diagnosis not present

## 2020-03-13 DIAGNOSIS — Z7901 Long term (current) use of anticoagulants: Secondary | ICD-10-CM | POA: Diagnosis not present

## 2020-04-05 ENCOUNTER — Ambulatory Visit (INDEPENDENT_AMBULATORY_CARE_PROVIDER_SITE_OTHER): Payer: Medicare Other | Admitting: Internal Medicine

## 2020-04-05 ENCOUNTER — Other Ambulatory Visit: Payer: Self-pay

## 2020-04-05 ENCOUNTER — Encounter: Payer: Self-pay | Admitting: Internal Medicine

## 2020-04-05 VITALS — BP 130/86 | HR 66 | Temp 97.4°F | Resp 16 | Ht 66.0 in | Wt 181.4 lb

## 2020-04-05 DIAGNOSIS — E7800 Pure hypercholesterolemia, unspecified: Secondary | ICD-10-CM

## 2020-04-05 DIAGNOSIS — I4891 Unspecified atrial fibrillation: Secondary | ICD-10-CM | POA: Diagnosis not present

## 2020-04-05 DIAGNOSIS — C8591 Non-Hodgkin lymphoma, unspecified, lymph nodes of head, face, and neck: Secondary | ICD-10-CM

## 2020-04-05 DIAGNOSIS — Z23 Encounter for immunization: Secondary | ICD-10-CM

## 2020-04-05 DIAGNOSIS — Z7901 Long term (current) use of anticoagulants: Secondary | ICD-10-CM

## 2020-04-05 DIAGNOSIS — K760 Fatty (change of) liver, not elsewhere classified: Secondary | ICD-10-CM | POA: Diagnosis not present

## 2020-04-05 DIAGNOSIS — Z853 Personal history of malignant neoplasm of breast: Secondary | ICD-10-CM

## 2020-04-05 DIAGNOSIS — E559 Vitamin D deficiency, unspecified: Secondary | ICD-10-CM | POA: Diagnosis not present

## 2020-04-05 LAB — LIPID PANEL
Cholesterol: 243 mg/dL — ABNORMAL HIGH (ref 0–200)
HDL: 58.1 mg/dL (ref >39.00)
LDL Cholesterol: 160 mg/dL — ABNORMAL HIGH (ref 0–99)
NonHDL: 184.52
Total CHOL/HDL Ratio: 4
Triglycerides: 125 mg/dL (ref 0.0–149.0)
VLDL: 25 mg/dL (ref 0.0–40.0)

## 2020-04-05 LAB — VITAMIN D 25 HYDROXY (VIT D DEFICIENCY, FRACTURES): VITD: 53.21 ng/mL (ref 30.00–100.00)

## 2020-04-05 LAB — COMPREHENSIVE METABOLIC PANEL
ALT: 27 U/L (ref 0–35)
AST: 27 U/L (ref 0–37)
Albumin: 4.4 g/dL (ref 3.5–5.2)
Alkaline Phosphatase: 62 U/L (ref 39–117)
BUN: 21 mg/dL (ref 6–23)
CO2: 28 mEq/L (ref 19–32)
Calcium: 9.8 mg/dL (ref 8.4–10.5)
Chloride: 100 mEq/L (ref 96–112)
Creatinine, Ser: 0.79 mg/dL (ref 0.40–1.20)
GFR: 70.1 mL/min (ref >60.00)
Glucose, Bld: 94 mg/dL (ref 70–99)
Potassium: 4.2 mEq/L (ref 3.5–5.1)
Sodium: 135 mEq/L (ref 135–145)
Total Bilirubin: 0.7 mg/dL (ref 0.2–1.2)
Total Protein: 6.5 g/dL (ref 6.0–8.3)

## 2020-04-05 LAB — LDL CHOLESTEROL, DIRECT: Direct LDL: 156 mg/dL

## 2020-04-05 NOTE — Patient Instructions (Signed)
I will order your diagnostic mammogram to be done at [facility] here in [place] After Age 80 After age 52, you are at a higher risk for certain long-term diseases and infections as well as injuries from falls. Falls are a major cause of broken bones and head injuries in people who are older than age 72. Getting regular preventive care can help to keep you healthy and well. Preventive care includes getting regular testing and making lifestyle changes as recommended by your health care provider. Talk with your health care provider about:  Which screenings and tests you should have. A screening is a test that checks for a disease when you have no symptoms.  A diet and exercise plan that is right for you. What should I know about screenings and tests to prevent falls? Screening and testing are the best ways to find a health problem early. Early diagnosis and treatment give you the best chance of managing medical conditions that are common after age 46. Certain conditions and lifestyle choices may make you more likely to have a fall. Your health care provider may recommend:  Regular vision checks. Poor vision and conditions such as cataracts can make you more likely to have a fall. If you wear glasses, make sure to get your prescription updated if your vision changes.  Medicine review. Work with your health care provider to regularly review all of the medicines you are taking, including over-the-counter medicines. Ask your health care provider about any side effects that may make you more likely to have a fall. Tell your health care provider if any medicines that you take make you feel dizzy or sleepy.  Osteoporosis screening. Osteoporosis is a condition that causes the bones to get weaker. This can make the bones weak and cause them to break more easily.  Blood pressure screening. Blood pressure changes and medicines to control blood pressure can make you feel  dizzy.  Strength and balance checks. Your health care provider may recommend certain tests to check your strength and balance while standing, walking, or changing positions.  Foot health exam. Foot pain and numbness, as well as not wearing proper footwear, can make you more likely to have a fall.  Depression screening. You may be more likely to have a fall if you have a fear of falling, feel emotionally low, or feel unable to do activities that you used to do.  Alcohol use screening. Using too much alcohol can affect your balance and may make you more likely to have a fall. What actions can I take to lower my risk of falls? General instructions  Talk with your health care provider about your risks for falling. Tell your health care provider if: ? You fall. Be sure to tell your health care provider about all falls, even ones that seem minor. ? You feel dizzy, sleepy, or off-balance.  Take over-the-counter and prescription medicines only as told by your health care provider. These include any supplements.  Eat a healthy diet and maintain a healthy weight. A healthy diet includes low-fat dairy products, low-fat (lean) meats, and fiber from whole grains, beans, and lots of fruits and vegetables. Home safety  Remove any tripping hazards, such as rugs, cords, and clutter.  Install safety equipment such as grab bars in bathrooms and safety rails on stairs.  Keep rooms and walkways well-lit. Activity   Follow a regular exercise program to stay fit. This will help you maintain your balance. Ask your health  care provider what types of exercise are appropriate for you.  If you need a cane or walker, use it as recommended by your health care provider.  Wear supportive shoes that have nonskid soles. Lifestyle  Do not drink alcohol if your health care provider tells you not to drink.  If you drink alcohol, limit how much you have: ? 0-1 drink a day for women. ? 0-2 drinks a day for  men.  Be aware of how much alcohol is in your drink. In the [place] one drink equals one typical bottle of beer (12 oz), one-half glass of wine (5 oz), or one shot of hard liquor (1 oz).  Do not use any products that contain nicotine or tobacco, such as cigarettes and e-cigarettes. If you need help quitting, ask your health care provider. Summary  Having a healthy lifestyle and getting preventive care can help to protect your health and wellness after age 36.  Screening and testing are the best way to find a health problem early and help you avoid having a fall. Early diagnosis and treatment give you the best chance for managing medical conditions that are more common for people who are older than age 69.  Falls are a major cause of broken bones and head injuries in people who are older than age 52. Take precautions to prevent a fall at home.  Work with your health care provider to learn what changes you can make to improve your health and wellness and to prevent falls. This information is not intended to replace advice given to you by your health care provider. Make sure you discuss any questions you have with your health care provider. Document Revised: 02/11/2019 Document Reviewed: 09/03/2017 Elsevier Patient Education  2020 [employer].

## 2020-04-05 NOTE — Progress Notes (Signed)
Patient ID: Carol [name], female    DOB: [birth year]  Age: 79 y.o. MRN: [id number]  The patient is here for annual follow up and management of other chronic and acute problems.   The risk factors are reflected in the social history.  The roster of all physicians providing medical care to patient - is listed in the Snapshot section of the chart.  Activities of daily living:  The patient is 100% independent in all ADLs: dressing, toileting, feeding as well as independent mobility  Home safety : The patient has smoke detectors in the home. They wear seatbelts.  There are no firearms at home. There is no violence in the home.   There is no risks for hepatitis, STDs or HIV. There is no   history of blood transfusion. They have no travel history to infectious disease endemic areas of the world.  The patient has seen their dentist in the last six month. They have seen their eye doctor in the last year. They admit to slight hearing difficulty with regard to whispered voices and some television programs.  They have deferred audiologic testing in the last year.  They do not  have excessive sun exposure. Discussed the need for sun protection: hats, long sleeves and use of sunscreen if there is significant sun exposure.   Diet: the importance of a healthy diet is discussed. They do have a healthy diet.  The benefits of regular aerobic exercise were discussed. She walks 4 times per week ,  20 minutes.   Depression screen: there are no signs or vegative symptoms of depression- irritability, change in appetite, anhedonia, sadness/tearfullness.  Cognitive assessment: the patient manages all their financial and personal affairs and is actively engaged. They could relate day,date,year and events; recalled 2/3 objects at 3 minutes; performed clock-face test normally.  The following portions of the patient's history were reviewed and updated as appropriate: allergies, current medications, past family  history, past medical history,  past surgical history, past social history  and problem list.  Visual acuity was not assessed per patient preference since she has regular follow up with her ophthalmologist. Hearing and body mass index were assessed and reviewed.   During the course of the visit the patient was educated and counseled about appropriate screening and preventive services including : fall prevention , diabetes screening, nutrition counseling, colorectal cancer screening, and recommended immunizations.    CC: The primary encounter diagnosis was Pure hypercholesterolemia. Diagnoses of Hepatic steatosis, Vitamin D deficiency, Need for 23-polyvalent pneumococcal polysaccharide vaccine, History of breast cancer, Long term current use of anticoagulant therapy, Atrial fibrillation with rapid ventricular response ([place]), and Lymphoma of lymph nodes of head, face, and/or neck ([place]) were also pertinent to this visit.  Breast exam in March by Heme ONC.  Labs done then as well.  Oncologist retiring and her lymphoma is in remission so he is turning surveillance over to me  Saw Porfilio for dry eye in March In March was treated by Derm for shingles , rash spread,  Then treated for  dermatitis,  Then finally Treated for cellulitis by hemo Onc . CBC sr and CRP were normal   Breast Cancer 2015,:  No recurrence.  Needs annual diagnostic mammogram  PREVIOUSLY  DONE AT [facility] BUT HAS BEEN RELEASED  So wants it done at [place]   DECIDED AGAINST FOLLOW COLONOSCOPY AFTER LAST ONE IN 2008 IN SPITE OF FAMILY HISTORY (GRANDMOTHER).  WAS GIVEN STOOL CARDS BUT DID NOT RETURN THEM  [name] has a past medical history of Chronic sinusitis, Fibrocystic breast disease, History of pneumonia (1999), Hyperlipidemia, Irritable bowel syndrome, Lichen planus, lymphoma (August 2013), Mild tricuspid insufficiency (Jan 2012), and Moderate mitral insufficiency (JAN 2012).   She has a past surgical history  that includes Maxillary sinus lift (1990's); oophorectomy; Breast surgery; Abdominal hysterectomy; Ablation of dysrhythmic focus (August 2013); and squamous cell removal (Right).   Her family history includes Cancer in her maternal grandfather and mother; Diverticulitis in her sister; Heart disease in her father; Hyperlipidemia in her father; Hypertension in her father; Kidney disease in her father.She reports that she has never smoked. She has never used smokeless tobacco. She reports current alcohol use of about 4.0 standard drinks of alcohol per week. She reports that she does not use drugs.  Outpatient Medications Prior to Visit  Medication Sig Dispense Refill  . Bisacodyl (LAXATIVE PO) Take 1 tablet by mouth daily as needed. Reported on 11/28/2015    . diphenhydrAMINE (BENADRYL) 25 mg capsule Take 25 mg by mouth every 6 (six) hours as needed.    . dofetilide (TIKOSYN) 250 MCG capsule Take 250 mcg by mouth 2 (two) times daily.    . furosemide (LASIX) 20 MG tablet TAKE 1 TABLET EVERY DAY 90 tablet 0  . ibuprofen (ADVIL,MOTRIN) 200 MG tablet Take 200 mg by mouth every 6 (six) hours as needed.    . Magnesium 400 MG CAPS Take 1 capsule by mouth 2 (two) times daily. 180 capsule 3  . metoprolol succinate (TOPROL-XL) 50 MG 24 hr tablet Take 25 mg by mouth daily.    Marland Kitchen omeprazole (PRILOSEC) 20 MG capsule TAKE 1 CAPSULE EVERY DAY 90 capsule 0  . warfarin (COUMADIN) 1 MG tablet Take 1 mg by mouth daily.    Marland Kitchen warfarin (COUMADIN) 6 MG tablet Take 6 mg by mouth daily.    Marland Kitchen diltiazem (CARDIZEM) 60 MG tablet Take 1 tablet (60 mg total) by mouth as needed. 90 tablet 1  . albuterol (PROVENTIL HFA;VENTOLIN HFA) 108 (90 BASE) MCG/ACT inhaler Inhale 2 puffs into the lungs every 6 (six) hours as needed. (Patient not taking: Reported on 04/05/2020) 18 g 0  . tamoxifen (NOLVADEX) 20 MG tablet Take 20 mg by mouth daily.     No facility-administered medications prior to visit.    Review of Systems   Patient denies  headache, fevers, malaise, unintentional weight loss, skin rash, eye pain, sinus congestion and sinus pain, sore throat, dysphagia,  hemoptysis , cough, dyspnea, wheezing, chest pain, palpitations, orthopnea, edema, abdominal pain, nausea, melena, diarrhea, constipation, flank pain, dysuria, hematuria, urinary  Frequency, nocturia, numbness, tingling, seizures,  Focal weakness, Loss of consciousness,  Tremor, insomnia, depression, anxiety, and suicidal ideation.      Objective:  BP 130/86 (BP Location: Left Arm, Patient Position: Sitting, Cuff Size: Normal)   Pulse 66   Temp (!) 97.4 F (36.3 C) (Temporal)   Resp 16   Ht 5\' 6"  (1.676 m)   Wt 181 lb 6.4 oz (82.3 kg)   SpO2 99%   BMI 29.28 kg/m   Physical Exam  General appearance: alert, cooperative and appears stated age Ears: normal TM's and external ear canals both ears Throat: lips, mucosa, and tongue normal; teeth and gums normal Neck: no adenopathy, no carotid bruit, supple, symmetrical, trachea midline and thyroid not enlarged, symmetric, no tenderness/mass/nodules Back: symmetric, no curvature. ROM normal. No CVA tenderness. Lungs: clear to auscultation bilaterally Heart: regular rate and rhythm, S1, S2 normal, no murmur,  click, rub or gallop Abdomen: soft, non-tender; bowel sounds normal; no masses,  no organomegaly Pulses: 2+ and symmetric Skin: Skin color, texture, turgor normal. No rashes or lesions Lymph nodes: Cervical, supraclavicular, and axillary nodes normal.  Assessment & Plan:   Problem List Items Addressed This Visit      Unprioritized   Atrial fibrillation with rapid ventricular response ([place])    S/p ablation  , now managed with metoprolol and Tikosyn. Lytes normal  Lab Results  Component Value Date   NA 135 04/05/2020   K 4.2 04/05/2020   CL 100 04/05/2020   CO2 28 04/05/2020         Hepatic steatosis    Presumed by ultrasound changes and serologies negative for autoimmune causes of hepatitis.   Current liver enzymes are normal and all modifiable risk factors including obesity, diabetes and hyperlipidemia have been addressed .  Lab Results  Component Value Date   ALT 27 04/05/2020   AST 27 04/05/2020   ALKPHOS 62 04/05/2020   BILITOT 0.7 04/05/2020         Relevant Orders   Comprehensive metabolic panel (Completed)   Hyperlipidemia - Primary    Untreated secondary to Statin intolerance.  Direct LDL has risen to 156  Lab Results  Component Value Date   CHOL 243 (H) 04/05/2020   HDL 58.10 04/05/2020   LDLCALC 160 (H) 04/05/2020   LDLDIRECT 156.0 04/05/2020   TRIG 125.0 04/05/2020   CHOLHDL 4 04/05/2020         Relevant Orders   Lipid panel (Completed)   Direct LDL (Completed)   Long term current use of anticoagulant therapy    CBC is normal   Lab Results  Component Value Date   WBC 6.1 01/06/2020   HGB 13.2 01/06/2020   HCT 39.1 01/06/2020   MCV 94.4 01/06/2020   PLT 287 01/06/2020         Lymphoma of lymph nodes of head, face, and/or neck ([place])    Now in remission.  Continue periodic surveillance        Other Visit Diagnoses    Vitamin D deficiency       Relevant Orders   VITAMIN D 25 Hydroxy (Vit-D Deficiency, Fractures) (Completed)   Need for 23-polyvalent pneumococcal polysaccharide vaccine       Relevant Orders   Pneumococcal polysaccharide vaccine 23-valent greater than or equal to 2yo subcutaneous/IM (Completed)   History of breast cancer       Relevant Orders   MM Digital Diagnostic Bilat      I have discontinued Quita Skye P. Emile's diltiazem and tamoxifen. I am also having her maintain her warfarin, diphenhydrAMINE, Bisacodyl (LAXATIVE PO), ibuprofen, dofetilide, Magnesium, albuterol, warfarin, metoprolol succinate, furosemide, and omeprazole.  No orders of the defined types were placed in this encounter.   Medications Discontinued During This Encounter  Medication Reason  . tamoxifen (NOLVADEX) 20 MG tablet Completed Course  .  diltiazem (CARDIZEM) 60 MG tablet     Follow-up: No follow-ups on file.   Crecencio Mc, MD

## 2020-04-06 NOTE — Assessment & Plan Note (Addendum)
S/p ablation  , now managed with metoprolol and Tikosyn. Lytes normal  Lab Results  Component Value Date   NA 135 04/05/2020   K 4.2 04/05/2020   CL 100 04/05/2020   CO2 28 04/05/2020

## 2020-04-06 NOTE — Assessment & Plan Note (Signed)
Now in remission.  Continue periodic surveillance

## 2020-04-06 NOTE — Assessment & Plan Note (Signed)
Untreated secondary to Statin intolerance.  Direct LDL has risen to 156  Lab Results  Component Value Date   CHOL 243 (H) 04/05/2020   HDL 58.10 04/05/2020   LDLCALC 160 (H) 04/05/2020   LDLDIRECT 156.0 04/05/2020   TRIG 125.0 04/05/2020   CHOLHDL 4 04/05/2020

## 2020-04-06 NOTE — Assessment & Plan Note (Signed)
Presumed by ultrasound changes and serologies negative for autoimmune causes of hepatitis.  Current liver enzymes are normal and all modifiable risk factors including obesity, diabetes and hyperlipidemia have been addressed .  Lab Results  Component Value Date   ALT 27 04/05/2020   AST 27 04/05/2020   ALKPHOS 62 04/05/2020   BILITOT 0.7 04/05/2020

## 2020-04-06 NOTE — Assessment & Plan Note (Signed)
CBC is normal   Lab Results  Component Value Date   WBC 6.1 01/06/2020   HGB 13.2 01/06/2020   HCT 39.1 01/06/2020   MCV 94.4 01/06/2020   PLT 287 01/06/2020

## 2020-04-17 ENCOUNTER — Other Ambulatory Visit: Payer: Self-pay | Admitting: Internal Medicine

## 2020-04-18 ENCOUNTER — Ambulatory Visit: Payer: Medicare Other

## 2020-05-17 DIAGNOSIS — Z7901 Long term (current) use of anticoagulants: Secondary | ICD-10-CM | POA: Diagnosis not present

## 2020-05-25 DIAGNOSIS — I480 Paroxysmal atrial fibrillation: Secondary | ICD-10-CM | POA: Diagnosis not present

## 2020-05-25 DIAGNOSIS — Z5181 Encounter for therapeutic drug level monitoring: Secondary | ICD-10-CM | POA: Diagnosis not present

## 2020-05-25 DIAGNOSIS — I341 Nonrheumatic mitral (valve) prolapse: Secondary | ICD-10-CM | POA: Diagnosis not present

## 2020-05-25 DIAGNOSIS — Z79899 Other long term (current) drug therapy: Secondary | ICD-10-CM | POA: Diagnosis not present

## 2020-05-25 DIAGNOSIS — R0602 Shortness of breath: Secondary | ICD-10-CM | POA: Diagnosis not present

## 2020-05-25 DIAGNOSIS — R0600 Dyspnea, unspecified: Secondary | ICD-10-CM | POA: Diagnosis not present

## 2020-05-25 DIAGNOSIS — I10 Essential (primary) hypertension: Secondary | ICD-10-CM | POA: Diagnosis not present

## 2020-06-15 DIAGNOSIS — Z7901 Long term (current) use of anticoagulants: Secondary | ICD-10-CM | POA: Diagnosis not present

## 2020-06-19 ENCOUNTER — Other Ambulatory Visit: Payer: Self-pay | Admitting: Internal Medicine

## 2020-07-12 DIAGNOSIS — I341 Nonrheumatic mitral (valve) prolapse: Secondary | ICD-10-CM | POA: Diagnosis not present

## 2020-07-12 DIAGNOSIS — R0602 Shortness of breath: Secondary | ICD-10-CM | POA: Diagnosis not present

## 2020-07-12 DIAGNOSIS — R0600 Dyspnea, unspecified: Secondary | ICD-10-CM | POA: Diagnosis not present

## 2020-07-17 DIAGNOSIS — Z7901 Long term (current) use of anticoagulants: Secondary | ICD-10-CM | POA: Diagnosis not present

## 2020-07-20 DIAGNOSIS — Z853 Personal history of malignant neoplasm of breast: Secondary | ICD-10-CM | POA: Diagnosis not present

## 2020-07-20 DIAGNOSIS — R922 Inconclusive mammogram: Secondary | ICD-10-CM | POA: Diagnosis not present

## 2020-08-10 ENCOUNTER — Telehealth: Payer: Self-pay | Admitting: Internal Medicine

## 2020-08-10 DIAGNOSIS — D2262 Melanocytic nevi of left upper limb, including shoulder: Secondary | ICD-10-CM | POA: Diagnosis not present

## 2020-08-10 DIAGNOSIS — D225 Melanocytic nevi of trunk: Secondary | ICD-10-CM | POA: Diagnosis not present

## 2020-08-10 DIAGNOSIS — D2272 Melanocytic nevi of left lower limb, including hip: Secondary | ICD-10-CM | POA: Diagnosis not present

## 2020-08-10 DIAGNOSIS — Z85828 Personal history of other malignant neoplasm of skin: Secondary | ICD-10-CM | POA: Diagnosis not present

## 2020-08-10 DIAGNOSIS — D2271 Melanocytic nevi of right lower limb, including hip: Secondary | ICD-10-CM | POA: Diagnosis not present

## 2020-08-10 DIAGNOSIS — D2261 Melanocytic nevi of right upper limb, including shoulder: Secondary | ICD-10-CM | POA: Diagnosis not present

## 2020-08-10 NOTE — Telephone Encounter (Signed)
Left message for patient to call back and schedule Medicare Annual Wellness Visit (AWV)   This should be a telephone visit only=30 minutes.  Last AWV 04/16/19; please schedule at anytime with Denisa O'Brien-Blaney at [facility]

## 2020-08-14 DIAGNOSIS — Z7901 Long term (current) use of anticoagulants: Secondary | ICD-10-CM | POA: Diagnosis not present

## 2020-08-15 ENCOUNTER — Ambulatory Visit (INDEPENDENT_AMBULATORY_CARE_PROVIDER_SITE_OTHER): Payer: Medicare Other

## 2020-08-15 VITALS — Ht 66.0 in | Wt 181.0 lb

## 2020-08-15 DIAGNOSIS — Z0000 Encounter for general adult medical examination without abnormal findings: Secondary | ICD-10-CM | POA: Diagnosis not present

## 2020-08-15 NOTE — Patient Instructions (Addendum)
Carol [name] , Thank you for taking time to come for your Medicare Wellness Visit. I appreciate your ongoing commitment to your health goals. Please review the following plan we discussed and let me know if I can assist you in the future.   These are the goals we discussed: Goals      Patient Stated   .  Portion controlled carbs (pt-stated)      I'd like to lose a little weight       This is a list of the screening recommended for you and due dates:  Health Maintenance  Topic Date Due  . Colon Cancer Screening  02/12/2012  . Flu Shot  06/04/2020  . Tetanus Vaccine  11/27/2021  . DEXA scan (bone density measurement)  Completed  . COVID-19 Vaccine  Completed  .  Hepatitis C: One time screening is recommended by Center for Disease Control  (CDC) for  adults born from [birth year] through 1966.   Completed  . Pneumonia vaccines  Completed    Immunizations Immunization History  Administered Date(s) Administered  . Influenza, High Dose Seasonal PF 09/26/2016  . Influenza, Seasonal, Injecte, Preservative Fre 11/24/2012  . Influenza,inj,Quad PF,6+ Mos 08/24/2013, 09/15/2014, 08/30/2015, 09/15/2017, 09/09/2018  . Influenza-Unspecified 08/04/2013, 08/07/2014, 09/19/2015, 08/10/2019  . PFIZER SARS-COV-2 Vaccination 11/30/2019, 12/21/2019  . Pneumococcal Conjugate-13 11/27/2015  . Pneumococcal Polysaccharide-23 11/05/2013, 04/05/2020  . Tdap 11/28/2011  . Zoster 05/17/2008   Keep all routine maintenance appointments.   Follow up 10/11/20 @ 9:00  Advanced directives: none new  Conditions/risks identified: none new  Follow up in one year for your annual wellness visit.   Preventive Care 77 Years and Older, Female Preventive care refers to lifestyle choices and visits with your health care provider that can promote health and wellness. What does preventive care include?  A yearly physical exam. This is also called an annual well check.  Dental exams once or twice a year.  Routine  eye exams. Ask your health care provider how often you should have your eyes checked.  Personal lifestyle choices, including:  Daily care of your teeth and gums.  Regular physical activity.  Eating a healthy diet.  Avoiding tobacco and drug use.  Limiting alcohol use.  Practicing safe sex.  Taking low-dose aspirin every day.  Taking vitamin and mineral supplements as recommended by your health care provider. What happens during an annual well check? The services and screenings done by your health care provider during your annual well check will depend on your age, overall health, lifestyle risk factors, and family history of disease. Counseling  Your health care provider may ask you questions about your:  Alcohol use.  Tobacco use.  Drug use.  Emotional well-being.  Home and relationship well-being.  Sexual activity.  Eating habits.  History of falls.  Memory and ability to understand (cognition).  Work and work Statistician.  Reproductive health. Screening  You may have the following tests or measurements:  Height, weight, and BMI.  Blood pressure.  Lipid and cholesterol levels. These may be checked every 5 years, or more frequently if you are over 22 years old.  Skin check.  Lung cancer screening. You may have this screening every year starting at age 64 if you have a 30-pack-year history of smoking and currently smoke or have quit within the past 15 years.  Fecal occult blood test (FOBT) of the stool. You may have this test every year starting at age 40.  Flexible sigmoidoscopy or colonoscopy. You may have  a sigmoidoscopy every 5 years or a colonoscopy every 10 years starting at age 33.  Hepatitis C blood test.  Hepatitis B blood test.  Sexually transmitted disease (STD) testing.  Diabetes screening. This is done by checking your blood sugar (glucose) after you have not eaten for a while (fasting). You may have this done every 1-3 years.  Bone  density scan. This is done to screen for osteoporosis. You may have this done starting at age 54.  Mammogram. This may be done every 1-2 years. Talk to your health care provider about how often you should have regular mammograms. Talk with your health care provider about your test results, treatment options, and if necessary, the need for more tests. Vaccines  Your health care provider may recommend certain vaccines, such as:  Influenza vaccine. This is recommended every year.  Tetanus, diphtheria, and acellular pertussis (Tdap, Td) vaccine. You may need a Td booster every 10 years.  Zoster vaccine. You may need this after age 46.  Pneumococcal 13-valent conjugate (PCV13) vaccine. One dose is recommended after age 64.  Pneumococcal polysaccharide (PPSV23) vaccine. One dose is recommended after age 19. Talk to your health care provider about which screenings and vaccines you need and how often you need them. This information is not intended to replace advice given to you by your health care provider. Make sure you discuss any questions you have with your health care provider. Document Released: 11/17/2015 Document Revised: 07/10/2016 Document Reviewed: 08/22/2015 Elsevier Interactive Patient Education  2017 [place] Prevention in the Home Falls can cause injuries. They can happen to people of all ages. There are many things you can do to make your home safe and to help prevent falls. What can I do on the outside of my home?  Regularly fix the edges of walkways and driveways and fix any cracks.  Remove anything that might make you trip as you walk through a door, such as a raised step or threshold.  Trim any bushes or trees on the path to your home.  Use bright outdoor lighting.  Clear any walking paths of anything that might make someone trip, such as rocks or tools.  Regularly check to see if handrails are loose or broken. Make sure that both sides of any steps have  handrails.  Any raised decks and porches should have guardrails on the edges.  Have any leaves, snow, or ice cleared regularly.  Use sand or salt on walking paths during winter.  Clean up any spills in your garage right away. This includes oil or grease spills. What can I do in the bathroom?  Use night lights.  Install grab bars by the toilet and in the tub and shower. Do not use towel bars as grab bars.  Use non-skid mats or decals in the tub or shower.  If you need to sit down in the shower, use a plastic, non-slip stool.  Keep the floor dry. Clean up any water that spills on the floor as soon as it happens.  Remove soap buildup in the tub or shower regularly.  Attach bath mats securely with double-sided non-slip rug tape.  Do not have throw rugs and other things on the floor that can make you trip. What can I do in the bedroom?  Use night lights.  Make sure that you have a light by your bed that is easy to reach.  Do not use any sheets or blankets that are too big for your bed.  They should not hang down onto the floor.  Have a firm chair that has side arms. You can use this for support while you get dressed.  Do not have throw rugs and other things on the floor that can make you trip. What can I do in the kitchen?  Clean up any spills right away.  Avoid walking on wet floors.  Keep items that you use a lot in easy-to-reach places.  If you need to reach something above you, use a strong step stool that has a grab bar.  Keep electrical cords out of the way.  Do not use floor polish or wax that makes floors slippery. If you must use wax, use non-skid floor wax.  Do not have throw rugs and other things on the floor that can make you trip. What can I do with my stairs?  Do not leave any items on the stairs.  Make sure that there are handrails on both sides of the stairs and use them. Fix handrails that are broken or loose. Make sure that handrails are as long as  the stairways.  Check any carpeting to make sure that it is firmly attached to the stairs. Fix any carpet that is loose or worn.  Avoid having throw rugs at the top or bottom of the stairs. If you do have throw rugs, attach them to the floor with carpet tape.  Make sure that you have a light switch at the top of the stairs and the bottom of the stairs. If you do not have them, ask someone to add them for you. What else can I do to help prevent falls?  Wear shoes that:  Do not have high heels.  Have rubber bottoms.  Are comfortable and fit you well.  Are closed at the toe. Do not wear sandals.  If you use a stepladder:  Make sure that it is fully opened. Do not climb a closed stepladder.  Make sure that both sides of the stepladder are locked into place.  Ask someone to hold it for you, if possible.  Clearly mark and make sure that you can see:  Any grab bars or handrails.  First and last steps.  Where the edge of each step is.  Use tools that help you move around (mobility aids) if they are needed. These include:  Canes.  Walkers.  Scooters.  Crutches.  Turn on the lights when you go into a dark area. Replace any light bulbs as soon as they burn out.  Set up your furniture so you have a clear path. Avoid moving your furniture around.  If any of your floors are uneven, fix them.  If there are any pets around you, be aware of where they are.  Review your medicines with your doctor. Some medicines can make you feel dizzy. This can increase your chance of falling. Ask your doctor what other things that you can do to help prevent falls. This information is not intended to replace advice given to you by your health care provider. Make sure you discuss any questions you have with your health care provider. Document Released: 08/17/2009 Document Revised: 03/28/2016 Document Reviewed: 11/25/2014 Elsevier Interactive Patient Education  2017 [employer].

## 2020-08-15 NOTE — Progress Notes (Addendum)
Subjective:   Carol [name] is a 79 y.o. female who presents for Medicare Annual (Subsequent) preventive examination.  Review of Systems    No ROS.  Medicare Wellness Virtual Visit.   Cardiac Risk Factors include: advanced age (>72men, >81 women)     Objective:    Today's Vitals   08/15/20 1036  Weight: 181 lb (82.1 kg)  Height: 5\' 6"  (1.676 m)   Body mass index is 29.21 kg/m.  Advanced Directives 08/15/2020 04/16/2019 01/27/2018 12/26/2016 11/27/2015  Does Patient Have a Medical Advance Directive? Yes Yes Yes Yes Yes  Type of Paramedic of [place];Living will [place];Living will [place];Living will [place];Living will [place];Living will  Does patient want to make changes to medical advance directive? No - Patient declined No - Patient declined No - Patient declined No - Patient declined No - Patient declined  Copy of [place] in Chart? No - copy requested No - copy requested No - copy requested No - copy requested -    Current Medications (verified) Outpatient Encounter Medications as of 08/15/2020  Medication Sig   albuterol (PROVENTIL HFA;VENTOLIN HFA) 108 (90 BASE) MCG/ACT inhaler Inhale 2 puffs into the lungs every 6 (six) hours as needed. (Patient not taking: Reported on 04/05/2020)   Bisacodyl (LAXATIVE PO) Take 1 tablet by mouth daily as needed. Reported on 11/28/2015   diphenhydrAMINE (BENADRYL) 25 mg capsule Take 25 mg by mouth every 6 (six) hours as needed.   dofetilide (TIKOSYN) 250 MCG capsule Take 250 mcg by mouth 2 (two) times daily.   furosemide (LASIX) 20 MG tablet TAKE 1 TABLET EVERY DAY   ibuprofen (ADVIL,MOTRIN) 200 MG tablet Take 200 mg by mouth every 6 (six) hours as needed.   Magnesium 400 MG CAPS Take 1 capsule by mouth 2 (two) times daily.   metoprolol succinate (TOPROL-XL) 50 MG 24 hr tablet Take 25 mg by mouth daily.     omeprazole (PRILOSEC) 20 MG capsule TAKE 1 CAPSULE EVERY DAY   warfarin (COUMADIN) 1 MG tablet Take 1 mg by mouth daily.   warfarin (COUMADIN) 6 MG tablet Take 6 mg by mouth daily.   [DISCONTINUED] levalbuterol (XOPENEX HFA) 45 MCG/ACT inhaler Inhale 1-2 puffs into the lungs every 4 (four) hours as needed for wheezing.   No facility-administered encounter medications on file as of 08/15/2020.    Allergies (verified) Prednisone, Clonidine, Lotemax [loteprednol etabonate], Morphine and related, Pulmicort [budesonide], Trovan [alatrofloxacin mesylate], Zelnorm [tegaserod maleate], and Erythromycin   History: Past Medical History:  Diagnosis Date   Chronic sinusitis    Fibrocystic breast disease    History of pneumonia 1999   Hyperlipidemia    Irritable bowel syndrome    Lichen planus    lymphoma August 2013   Low grade B cell   Mild tricuspid insufficiency Jan 2012   [facility], [place]   Moderate mitral insufficiency JAN 2012   [facility], [place]   Past Surgical History:  Procedure Laterality Date   ABDOMINAL HYSTERECTOMY     precancerous cervix,     ABLATION OF DYSRHYTHMIC FOCUS  August 2013   [facility], Dr. Marcello Moores   BREAST SURGERY     bilateral, benign biopsies   MAXILLARY SINUS LIFT  1990's   [name]   oophorectomy     squamous cell removal Right    right leg calf area.   Family History  Problem Relation Age of Onset  Cancer Mother        Breast   Hyperlipidemia Father    Heart disease Father    Hypertension Father    Kidney disease Father    Cancer Maternal Grandfather        colon CA   Diverticulitis Sister    Social History   Socioeconomic History   Marital status: Married    Spouse name: Not on file   Number of children: Not on file   Years of education: Not on file   Highest education level: Not on file  Occupational History   Not on file  Tobacco Use   Smoking status: Never Smoker   Smokeless tobacco: Never Used  Substance and Sexual Activity    Alcohol use: Yes    Alcohol/week: 4.0 standard drinks    Types: 4 Glasses of wine per week    Comment: Occ.   Drug use: No   Sexual activity: Never  Other Topics Concern   Not on file  Social History Narrative   Not on file   Social Determinants of Health   Financial Resource Strain: Low Risk    Difficulty of Paying Living Expenses: Not hard at all  Food Insecurity: No Food Insecurity   Worried About Charity fundraiser in the Last Year: Never true   [place] in the Last Year: Never true  Transportation Needs: No Transportation Needs   Lack of Transportation (Medical): No   Lack of Transportation (Non-Medical): No  Physical Activity:    Days of Exercise per Week: Not on file   Minutes of Exercise per Session: Not on file  Stress: No Stress Concern Present   Feeling of Stress : Not at all  Social Connections: Unknown   Frequency of Communication with Friends and Family: More than three times a week   Frequency of Social Gatherings with Friends and Family: More than three times a week   Attends Religious Services: Not on Electrical engineer or Organizations: Not on file   Attends Archivist Meetings: Not on file   Marital Status: Married    Tobacco Counseling Counseling given: Not Answered   Clinical Intake:  Pre-visit preparation completed: Yes        Diabetes: No  How often do you need to have someone help you when you read instructions, pamphlets, or other written materials from your doctor or pharmacy?: 1 - Never Interpreter Needed?: No      Activities of Daily Living In your present state of health, do you have any difficulty performing the following activities: 08/15/2020  Hearing? N  Vision? N  Difficulty concentrating or making decisions? N  Walking or climbing stairs? N  Dressing or bathing? N  Doing errands, shopping? N  Preparing Food and eating ? N  Using the Toilet? N  In the past six months, have you accidently  leaked urine? N  Do you have problems with loss of bowel control? N  Managing your Medications? N  Managing your Finances? N  Housekeeping or managing your Housekeeping? N  Some recent data might be hidden    Patient Care Team: Crecencio Mc, MD as PCP - General (Internal Medicine) Corey Skains, MD as Referring Physician (Internal Medicine)  Indicate any recent Medical Services you may have received from other than [facility] providers in the past year (date may be approximate).     Assessment:   This is a routine wellness examination for Diasha.  I connected with [place] today by telephone and verified that I am speaking with the correct person using two identifiers. Location patient: home Location provider: work Persons participating in the virtual visit: patient, Marine scientist.    I discussed the limitations, risks, security and privacy concerns of performing an evaluation and management service by telephone and the availability of in person appointments. The patient expressed understanding and verbally consented to this telephonic visit.    Interactive audio and video telecommunications were attempted between this provider and patient, however failed, due to patient having technical difficulties OR patient did not have access to video capability.  We continued and completed visit with audio only.  Some vital signs may be absent or patient reported.   Hearing/Vision screen  Hearing Screening   125Hz  250Hz  500Hz  1000Hz  2000Hz  3000Hz  4000Hz  6000Hz  8000Hz   Right ear:           Left ear:           Comments: Patient is able to hear conversational tones without difficulty. No issues reported.  Vision Screening Comments: Wears corrective lenses  Cataract extraction, bilateral  Visual acuity not assessed, virtual visit. They have seen their ophthalmologist in the last 12 months.   Dietary issues and exercise activities discussed: Current Exercise Habits: Home exercise routine Healthy  diet Good water intake  Goals       Patient Stated     Portion controlled carbs (pt-stated)      I'd like to lose a little weight       Depression Screen PHQ 2/9 Scores 08/15/2020 04/16/2019 01/27/2018 01/08/2018 12/26/2016 11/27/2015 11/07/2014  PHQ - 2 Score 0 0 0 0 0 0 0  PHQ- 9 Score - - - 0 - - -    Fall Risk Fall Risk  08/15/2020 04/05/2020 04/16/2019 01/27/2018 01/08/2018  Falls in the past year? 0 1 1 No No  Number falls in past yr: 0 0 0 - -  Comment - - Improper shoe fitting, lost her balance. - -  Injury with Fall? - 0 - - -  Follow up Falls evaluation completed Falls evaluation completed - - -   Handrails in use when climbing stairs? Yes Home free of loose throw rugs in walkways, pet beds, electrical cords, etc? Yes  Adequate lighting in your home to reduce risk of falls? Yes   ASSISTIVE DEVICES UTILIZED TO PREVENT FALLS: Use of a cane, walker or w/c? No   TIMED UP AND GO: Was the test performed? No . Virtual visit.   Cognitive Function: MMSE - Mini Mental State Exam 12/26/2016 11/27/2015  Orientation to time 5 5  Orientation to Place 5 5  Registration 3 3  Attention/ Calculation 4 5  Recall 2 3  Language- name 2 objects 2 2  Language- repeat 1 1  Language- follow 3 step command 3 3  Language- read & follow direction 1 1  Write a sentence 1 1  Copy design 1 1  Total score 28 30     6CIT Screen 08/15/2020 04/16/2019 01/27/2018  What Year? 0 points 0 points 0 points  What month? 0 points 0 points 0 points  What time? 0 points 0 points 0 points  Count back from 20 - 0 points 0 points  Months in reverse - 0 points 0 points  Repeat phrase - - 0 points  Total Score - - 0    Immunizations Immunization History  Administered Date(s) Administered   Influenza, High Dose Seasonal PF 09/26/2016  Influenza, Seasonal, Injecte, Preservative Fre 11/24/2012   Influenza,inj,Quad PF,6+ Mos 08/24/2013, 09/15/2014, 08/30/2015, 09/15/2017, 09/09/2018   Influenza-Unspecified  08/04/2013, 08/07/2014, 09/19/2015, 08/10/2019   PFIZER SARS-COV-2 Vaccination 11/30/2019, 12/21/2019   Pneumococcal Conjugate-13 11/27/2015   Pneumococcal Polysaccharide-23 11/05/2013, 04/05/2020   Tdap 11/28/2011   Zoster 05/17/2008   Health Maintenance Health Maintenance  Topic Date Due   COLONOSCOPY  02/12/2012   INFLUENZA VACCINE  06/04/2020   TETANUS/TDAP  11/27/2021   DEXA SCAN  Completed   COVID-19 Vaccine  Completed   Hepatitis C Screening  Completed   PNA vac Low Risk Adult  Completed   Colonoscopy- aged out.   Influenza vaccine- plans to receive later in the season.   Dental Screening: Recommended annual dental exams for proper oral hygiene.  Community Resource Referral / Chronic Care Management: CRR required this visit?  No   CCM required this visit?  No      Plan:   Keep all routine maintenance appointments.   Follow up 10/11/20 @ 9:00  I have personally reviewed and noted the following in the patient's chart:   Medical and social history Use of alcohol, tobacco or illicit drugs  Current medications and supplements Functional ability and status Nutritional status Physical activity Advanced directives List of other physicians Hospitalizations, surgeries, and ER visits in previous 12 months Vitals Screenings to include cognitive, depression, and falls Referrals and appointments  In addition, I have reviewed and discussed with patient certain preventive protocols, quality metrics, and best practice recommendations. A written personalized care plan for preventive services as well as general preventive health recommendations were provided to patient via mail.     OBrien-Blaney, Glennie Rodda L, LPN   [id number]    I have reviewed the above information and agree with above.   [name], MD

## 2020-08-26 ENCOUNTER — Other Ambulatory Visit: Payer: Self-pay | Admitting: Internal Medicine

## 2020-09-14 DIAGNOSIS — Z7901 Long term (current) use of anticoagulants: Secondary | ICD-10-CM | POA: Diagnosis not present

## 2020-10-05 DIAGNOSIS — E782 Mixed hyperlipidemia: Secondary | ICD-10-CM | POA: Diagnosis not present

## 2020-10-05 DIAGNOSIS — I480 Paroxysmal atrial fibrillation: Secondary | ICD-10-CM | POA: Diagnosis not present

## 2020-10-05 DIAGNOSIS — Z5181 Encounter for therapeutic drug level monitoring: Secondary | ICD-10-CM | POA: Diagnosis not present

## 2020-10-05 DIAGNOSIS — Z79899 Other long term (current) drug therapy: Secondary | ICD-10-CM | POA: Diagnosis not present

## 2020-10-05 DIAGNOSIS — I10 Essential (primary) hypertension: Secondary | ICD-10-CM | POA: Diagnosis not present

## 2020-10-05 DIAGNOSIS — Z789 Other specified health status: Secondary | ICD-10-CM | POA: Diagnosis not present

## 2020-10-05 DIAGNOSIS — I493 Ventricular premature depolarization: Secondary | ICD-10-CM | POA: Diagnosis not present

## 2020-10-05 DIAGNOSIS — Z7901 Long term (current) use of anticoagulants: Secondary | ICD-10-CM | POA: Diagnosis not present

## 2020-10-05 DIAGNOSIS — E785 Hyperlipidemia, unspecified: Secondary | ICD-10-CM | POA: Diagnosis not present

## 2020-10-05 DIAGNOSIS — I341 Nonrheumatic mitral (valve) prolapse: Secondary | ICD-10-CM | POA: Diagnosis not present

## 2020-10-11 ENCOUNTER — Other Ambulatory Visit: Payer: Self-pay

## 2020-10-11 ENCOUNTER — Ambulatory Visit (INDEPENDENT_AMBULATORY_CARE_PROVIDER_SITE_OTHER): Payer: Medicare Other | Admitting: Internal Medicine

## 2020-10-11 ENCOUNTER — Encounter: Payer: Self-pay | Admitting: Internal Medicine

## 2020-10-11 VITALS — BP 122/84 | HR 56 | Temp 97.9°F | Resp 15 | Ht 66.0 in | Wt 173.0 lb

## 2020-10-11 DIAGNOSIS — K760 Fatty (change of) liver, not elsewhere classified: Secondary | ICD-10-CM

## 2020-10-11 DIAGNOSIS — Z853 Personal history of malignant neoplasm of breast: Secondary | ICD-10-CM | POA: Diagnosis not present

## 2020-10-11 DIAGNOSIS — E7800 Pure hypercholesterolemia, unspecified: Secondary | ICD-10-CM

## 2020-10-11 DIAGNOSIS — Z23 Encounter for immunization: Secondary | ICD-10-CM

## 2020-10-11 DIAGNOSIS — Z7901 Long term (current) use of anticoagulants: Secondary | ICD-10-CM

## 2020-10-11 DIAGNOSIS — Z1211 Encounter for screening for malignant neoplasm of colon: Secondary | ICD-10-CM | POA: Diagnosis not present

## 2020-10-11 DIAGNOSIS — D6869 Other thrombophilia: Secondary | ICD-10-CM

## 2020-10-11 DIAGNOSIS — I4891 Unspecified atrial fibrillation: Secondary | ICD-10-CM | POA: Diagnosis not present

## 2020-10-11 DIAGNOSIS — I679 Cerebrovascular disease, unspecified: Secondary | ICD-10-CM | POA: Diagnosis not present

## 2020-10-11 DIAGNOSIS — E871 Hypo-osmolality and hyponatremia: Secondary | ICD-10-CM

## 2020-10-11 LAB — CBC WITH DIFFERENTIAL/PLATELET
Basophils Absolute: 0 10*3/uL (ref 0.0–0.1)
Basophils Relative: 0.7 % (ref 0.0–3.0)
Eosinophils Absolute: 0.1 10*3/uL (ref 0.0–0.7)
Eosinophils Relative: 1.6 % (ref 0.0–5.0)
HCT: 39 % (ref 36.0–46.0)
Hemoglobin: 13.2 g/dL (ref 12.0–15.0)
Lymphocytes Relative: 40.6 % (ref 12.0–46.0)
Lymphs Abs: 2.5 10*3/uL (ref 0.7–4.0)
MCHC: 33.8 g/dL (ref 30.0–36.0)
MCV: 95.3 fl (ref 78.0–100.0)
Monocytes Absolute: 0.5 10*3/uL (ref 0.1–1.0)
Monocytes Relative: 8.8 % (ref 3.0–12.0)
Neutro Abs: 3 10*3/uL (ref 1.4–7.7)
Neutrophils Relative %: 48.3 % (ref 43.0–77.0)
Platelets: 261 10*3/uL (ref 150.0–400.0)
RBC: 4.09 Mil/uL (ref 3.87–5.11)
RDW: 13.9 % (ref 11.5–15.5)
WBC: 6.1 10*3/uL (ref 4.0–10.5)

## 2020-10-11 LAB — BASIC METABOLIC PANEL
BUN: 17 mg/dL (ref 6–23)
CO2: 27 mEq/L (ref 19–32)
Calcium: 9 mg/dL (ref 8.4–10.5)
Chloride: 98 mEq/L (ref 96–112)
Creatinine, Ser: 0.83 mg/dL (ref 0.40–1.20)
GFR: 66.73 mL/min (ref >60.00)
Glucose, Bld: 92 mg/dL (ref 70–99)
Potassium: 3.6 mEq/L (ref 3.5–5.1)
Sodium: 132 mEq/L — ABNORMAL LOW (ref 135–145)

## 2020-10-11 MED ORDER — ZOSTER VAC RECOMB ADJUVANTED 50 MCG/0.5ML IM SUSR: 0.5000 mL | Freq: Once | INTRAMUSCULAR | 1 refills | Status: AC

## 2020-10-11 NOTE — Patient Instructions (Addendum)
Try reducing the crestor to every 3 days ( 2 times per week).  If you can tolerate this dosing ,  Stay on it because you will still benefit from it !    THE POINT OF THIS IS TO STABILIZE ANY PLACQUE IN YOUR CEREBRAL ARTERIES  If you decide you want the port removed,  Let me know    I will initiate the order for your overdue colon  cancer screening  With the cologuard Test.  It will be delivered to your house, and you will send off a stool sample in the envelope it provides.

## 2020-10-11 NOTE — Progress Notes (Signed)
Subjective:  Patient ID: Carol [name], female    DOB: [birth year]  Age: 79 y.o. MRN: [id number]  CC: The primary encounter diagnosis was Long term current use of anticoagulant therapy. Diagnoses of Pure hypercholesterolemia, Hepatic steatosis, Hyponatremia, Colon cancer screening, Need for immunization against influenza, Acquired thrombophilia ([place]), Ill-defined cerebrovascular disease, History of breast cancer, and Atrial fibrillation with rapid ventricular response ([place]) were also pertinent to this visit.  HPI Carol [name] presents for follow up  This visit occurred during the SARS-CoV-2 public health emergency.  Safety protocols were in place, including screening questions prior to the visit, additional usage of staff PPE, and extensive cleaning of exam room while observing appropriate contact time as indicated for disinfecting solutions.   Last mammo sept 2021 :  "s/p  Right lumpectomy,    architecturlal distortion otherwise normal   Recent trial of crestor 5 mg daily by [facility] NP last week .   Did not tolerate it.  This is her second trial of intolerance.  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 Also has back pain ,  Chronic,  Managed by pain clinic    Outpatient Medications Prior to Visit  Medication Sig Dispense Refill  . albuterol (PROVENTIL HFA;VENTOLIN HFA) 108 (90 BASE) MCG/ACT inhaler Inhale 2 puffs into the lungs every 6 (six) hours as needed. 18 g 0  . Bisacodyl (LAXATIVE PO) Take 1 tablet by mouth daily as needed. Reported on 11/28/2015    . diphenhydrAMINE (BENADRYL) 25 mg capsule Take 25 mg by mouth every 6 (six) hours as needed.    . dofetilide (TIKOSYN) 250 MCG capsule Take 250 mcg by mouth 2 (two) times daily.    . furosemide (LASIX) 20 MG tablet TAKE 1 TABLET EVERY DAY 90  tablet 0  . ibuprofen (ADVIL,MOTRIN) 200 MG tablet Take 200 mg by mouth every 6 (six) hours as needed.    . Magnesium 400 MG CAPS Take 1 capsule by mouth 2 (two) times daily. 180 capsule 3  . metoprolol succinate (TOPROL-XL) 50 MG 24 hr tablet Take 25 mg by mouth daily.    Marland Kitchen omeprazole (PRILOSEC) 20 MG capsule TAKE 1 CAPSULE EVERY DAY 90 capsule 0  . rosuvastatin (CRESTOR) 5 MG tablet Take 5 mg by mouth at bedtime.    Marland Kitchen warfarin (COUMADIN) 1 MG tablet Take 1 mg by mouth daily.    Marland Kitchen warfarin (COUMADIN) 6 MG tablet Take 6 mg by mouth daily.     No facility-administered medications prior to visit.    Review of Systems;  Patient denies headache, fevers, malaise, unintentional weight loss, skin rash, eye pain, sinus congestion and sinus pain, sore throat, dysphagia,  hemoptysis , cough, dyspnea, wheezing, chest pain, palpitations, orthopnea, edema, abdominal pain, nausea, melena, diarrhea, constipation, flank pain, dysuria, hematuria, urinary  Frequency, nocturia, numbness, tingling, seizures,  Focal weakness, Loss of consciousness,  Tremor, insomnia, depression, anxiety, and suicidal ideation.      Objective:  BP 122/84 (BP Location: Left Arm, Patient Position: Sitting, Cuff Size: Normal)   Pulse (!) 56   Temp 97.9 F (36.6 C) (Oral)   Resp 15   Ht 5\' 6"  (1.676 m)   Wt 173 lb (78.5 kg)   SpO2 99%   BMI 27.92 kg/m   BP Readings from Last 3 Encounters:  10/11/20  122/84  04/05/20 130/86  08/25/18 116/72    Wt Readings from Last 3 Encounters:  10/11/20 173 lb (78.5 kg)  08/15/20 181 lb (82.1 kg)  04/05/20 181 lb 6.4 oz (82.3 kg)    General appearance: alert, cooperative and appears stated age Ears: normal TM's and external ear canals both ears Throat: lips, mucosa, and tongue normal; teeth and gums normal Neck: no adenopathy, no carotid bruit, supple, symmetrical, trachea midline and thyroid not enlarged, symmetric, no tenderness/mass/nodules Back: symmetric, no curvature. ROM  normal. No CVA tenderness. Lungs: clear to auscultation bilaterally Heart: regular rate and rhythm, S1, S2 normal, no murmur, click, rub or gallop Abdomen: soft, non-tender; bowel sounds normal; no masses,  no organomegaly Pulses: 2+ and symmetric Skin: Skin color, texture, turgor normal. No rashes or lesions Lymph nodes: Cervical, supraclavicular, and axillary nodes normal.  Lab Results  Component Value Date   HGBA1C 5.7 05/27/2016    Lab Results  Component Value Date   CREATININE 0.83 10/11/2020   CREATININE 0.79 04/05/2020   CREATININE 0.91 08/25/2018    Lab Results  Component Value Date   WBC 6.1 10/11/2020   HGB 13.2 10/11/2020   HCT 39.0 10/11/2020   PLT 261.0 10/11/2020   GLUCOSE 92 10/11/2020   CHOL 243 (H) 04/05/2020   TRIG 125.0 04/05/2020   HDL 58.10 04/05/2020   LDLDIRECT 156.0 04/05/2020   LDLCALC 160 (H) 04/05/2020   ALT 27 04/05/2020   AST 27 04/05/2020   NA 132 (L) 10/11/2020   K 3.6 10/11/2020   CL 98 10/11/2020   CREATININE 0.83 10/11/2020   BUN 17 10/11/2020   CO2 27 10/11/2020   TSH 1.85 08/25/2018   INR 2.07 09/29/2017   HGBA1C 5.7 05/27/2016    No results found.  Assessment & Plan:   Problem List Items Addressed This Visit      Unprioritized   Ill-defined cerebrovascular disease    Noted on MRI done in 2018 during workup for TIA.        Relevant Medications   rosuvastatin (CRESTOR) 5 MG tablet   Hyperlipidemia    Historically untreated secondary to Statin intolerance, but she now taking crestor 5 mg daily but not tolerating daily dosing.  Advised reduce use to twice  weekly .         Relevant Medications   rosuvastatin (CRESTOR) 5 MG tablet   History of breast cancer    Diagnosed in 2015,  Managed with lumpectomy, XRT and TAM.  She completed  tamoxifen for a total of 5 years       Hepatic steatosis   Atrial fibrillation with rapid ventricular response ([place])    S/p ablation  , now managed with metoprolol and Tikosyn. Lytes  normal  Lab Results  Component Value Date   NA 132 (L) 10/11/2020   K 3.6 10/11/2020   CL 98 10/11/2020   CO2 27 10/11/2020         Relevant Medications   rosuvastatin (CRESTOR) 5 MG tablet   Acquired thrombophilia ([place]) - Primary    She has had no bleeding or bruising on warfard and hgb is normal   Lab Results  Component Value Date   WBC 6.1 10/11/2020   HGB 13.2 10/11/2020   HCT 39.0 10/11/2020   MCV 95.3 10/11/2020   PLT 261.0 10/11/2020          Other Visit Diagnoses    Hyponatremia       Relevant Orders   Basic metabolic panel (  Completed)   Colon cancer screening       Relevant Orders   Cologuard   Need for immunization against influenza       Relevant Orders   Flu Vaccine QUAD High Dose(Fluad) (Completed)      I am having Carol [name] start on Zoster Vaccine Adjuvanted. I am also having her maintain her warfarin, diphenhydrAMINE, Bisacodyl (LAXATIVE PO), ibuprofen, dofetilide, Magnesium, albuterol, warfarin, metoprolol succinate, furosemide, omeprazole, and rosuvastatin.  Meds ordered this encounter  Medications  . Zoster Vaccine Adjuvanted [facility]) injection    Sig: Inject 0.5 mLs into the muscle once for 1 dose.    Dispense:  1 each    Refill:  1   I provided  30 minutes of  face-to-face time during this encounter reviewing patient's current problems and past surgeries, labs and imaging studies, providing counseling on the above mentioned problems , and coordination  of care .  There are no discontinued medications.  Follow-up: Return in about 6 months (around 04/11/2021).   Crecencio Mc, MD

## 2020-10-12 DIAGNOSIS — I679 Cerebrovascular disease, unspecified: Secondary | ICD-10-CM | POA: Insufficient documentation

## 2020-10-12 NOTE — Assessment & Plan Note (Signed)
Historically untreated secondary to Statin intolerance, but she now taking crestor 5 mg daily but not tolerating daily dosing.  Advised reduce use to twice  weekly .

## 2020-10-12 NOTE — Assessment & Plan Note (Addendum)
Diagnosed in 2015,  Managed with lumpectomy, XRT and TAM.  She completed  tamoxifen for a total of 5 years

## 2020-10-12 NOTE — Assessment & Plan Note (Signed)
S/p ablation  , now managed with metoprolol and Tikosyn. Lytes normal  Lab Results  Component Value Date   NA 132 (L) 10/11/2020   K 3.6 10/11/2020   CL 98 10/11/2020   CO2 27 10/11/2020

## 2020-10-12 NOTE — Assessment & Plan Note (Signed)
She has had no bleeding or bruising on warfard and hgb is normal   Lab Results  Component Value Date   WBC 6.1 10/11/2020   HGB 13.2 10/11/2020   HCT 39.0 10/11/2020   MCV 95.3 10/11/2020   PLT 261.0 10/11/2020

## 2020-10-12 NOTE — Assessment & Plan Note (Signed)
Noted on MRI done in 2018 during workup for TIA.   

## 2020-10-16 DIAGNOSIS — Z7901 Long term (current) use of anticoagulants: Secondary | ICD-10-CM | POA: Diagnosis not present

## 2020-10-30 ENCOUNTER — Telehealth: Payer: Self-pay | Admitting: Internal Medicine

## 2020-10-30 DIAGNOSIS — R058 Other specified cough: Secondary | ICD-10-CM

## 2020-10-30 DIAGNOSIS — R059 Cough, unspecified: Secondary | ICD-10-CM

## 2020-10-30 NOTE — Telephone Encounter (Signed)
LMTCB

## 2020-10-30 NOTE — Telephone Encounter (Signed)
Spoken to patient, she stated she is having her yearly productive coughing and soreness in chest from coughing. She is able to cough up her mucus. She has this for over 30 years. She does not believe this is COVID. No fever, chills, SOB, sinus congestion, and chest px. Patient doesn't want to go to UC/ED. Patient will go if provider tells her to.

## 2020-10-30 NOTE — Telephone Encounter (Signed)
Pt called and said that she is having her annual sinus infection and cough. She is wanting to have something called in.

## 2020-10-30 NOTE — Addendum Note (Signed)
Addended by: Sherlene Shams on: 10/30/2020 05:15 PM   Modules accepted: Orders

## 2020-10-30 NOTE — Telephone Encounter (Signed)
Please set her up for covid and influenza testing in our clinic tomorrow afternoon. Please order the tests .  Then have her go to [facility] for a chest x ray.  I will not call in antibiotics unless these are done.

## 2020-10-31 ENCOUNTER — Ambulatory Visit
Admission: RE | Admit: 2020-10-31 | Discharge: 2020-10-31 | Disposition: A | Discharge status: Home / Self Care | Payer: Medicare Other | Attending: Internal Medicine | Admitting: Internal Medicine

## 2020-10-31 ENCOUNTER — Ambulatory Visit
Admission: RE | Admit: 2020-10-31 | Discharge: 2020-10-31 | Disposition: A | Discharge status: Home / Self Care | Payer: Medicare Other | Source: Ambulatory Visit | Attending: Internal Medicine | Admitting: Internal Medicine

## 2020-10-31 ENCOUNTER — Other Ambulatory Visit: Payer: Self-pay | Admitting: Internal Medicine

## 2020-10-31 ENCOUNTER — Other Ambulatory Visit: Payer: Self-pay

## 2020-10-31 ENCOUNTER — Encounter: Payer: Self-pay | Admitting: Internal Medicine

## 2020-10-31 DIAGNOSIS — R051 Acute cough: Secondary | ICD-10-CM | POA: Insufficient documentation

## 2020-10-31 DIAGNOSIS — R058 Other specified cough: Secondary | ICD-10-CM | POA: Diagnosis not present

## 2020-10-31 DIAGNOSIS — R059 Cough, unspecified: Secondary | ICD-10-CM | POA: Diagnosis not present

## 2020-10-31 DIAGNOSIS — R062 Wheezing: Secondary | ICD-10-CM | POA: Diagnosis not present

## 2020-10-31 MED ORDER — DOXYCYCLINE HYCLATE 100 MG PO TABS: 100.0000 mg | ORAL_TABLET | Freq: Two times a day (BID) | ORAL | 0 refills | Status: DC

## 2020-10-31 MED ORDER — BENZONATATE 200 MG PO CAPS: 200.0000 mg | ORAL_CAPSULE | Freq: Three times a day (TID) | ORAL | 0 refills | Status: DC | PRN

## 2020-10-31 NOTE — Telephone Encounter (Signed)
We are unable to do the covid/flu test/strep test today.  Due to Carol [name] being out of office. Not enough staff. Please advise.

## 2020-10-31 NOTE — Assessment & Plan Note (Signed)
Productive,  Without  Fevers,  covid negative,  Chest x ray clear.  No indication for antibiotics.  Tessalon perles sent for control.

## 2020-10-31 NOTE — Telephone Encounter (Signed)
Spoke with pt and she stated that she did a home covid test yesterday and it was negative. Pt stated that she has not had the chest xray done yet because she didn't know anything about it but she is going to go over to [facility] and have it done today. Pt stated that her sinus don't feel infected she just has some rattling in her chest and a cough.

## 2020-10-31 NOTE — Progress Notes (Signed)
Chest x ray suggests bronchitis.  Sending doxycycline to pharmacy. Daily use of a probiotic advised for 3 weeks.

## 2020-10-31 NOTE — Telephone Encounter (Signed)
LMTCB

## 2020-10-31 NOTE — Addendum Note (Signed)
Addended by: Sherlene Shams on: 10/31/2020 01:01 PM   Modules accepted: Orders

## 2020-10-31 NOTE — Telephone Encounter (Signed)
Chest x ray has not  Been finalized but looks clear to me.  Tessalon sent to [facility].  Will add antibiotic if radiologist disagrees and thinks there is an infiltrate (pneumonia).

## 2020-10-31 NOTE — Telephone Encounter (Signed)
Please advise her to get covid tested at her local pharmacy or do a home test to be sure it is not COVID. Based on her symptoms,  An antibiotic is not warranted because she has no history of COPD  And her symptoms sound viral in nature ( no fevers,  Facial pain,  )

## 2020-10-31 NOTE — Telephone Encounter (Signed)
Pt wanted Dr Darrick Huntsman to know that she had a chest xray and that she is negative for covid. She would like cough medicine

## 2020-10-31 NOTE — Telephone Encounter (Signed)
Spoke with pt to let her know that the cough medicine has been sent in to pharmacy. Pt gave a verbal understanding.

## 2020-11-10 ENCOUNTER — Telehealth: Payer: Self-pay

## 2020-11-10 DIAGNOSIS — E782 Mixed hyperlipidemia: Secondary | ICD-10-CM | POA: Diagnosis not present

## 2020-11-10 DIAGNOSIS — I10 Essential (primary) hypertension: Secondary | ICD-10-CM | POA: Diagnosis not present

## 2020-11-10 DIAGNOSIS — I480 Paroxysmal atrial fibrillation: Secondary | ICD-10-CM | POA: Diagnosis not present

## 2020-11-10 NOTE — Telephone Encounter (Signed)
Patient stated she is heading to her cardiologist to check out her SOB sx. She stated she isnt going to go to ED/UC. She reinterated that she is going to go to Cardiologist to be evaluated and she disconnected the cxall.

## 2020-11-10 NOTE — Telephone Encounter (Signed)
Pt called and states that her shortness breath has worsened and she feels like she can not get a good deep breath since last night. She states that she is not going to an urgent care or ED and just wants a call back. Please advise

## 2020-11-13 DIAGNOSIS — I480 Paroxysmal atrial fibrillation: Secondary | ICD-10-CM | POA: Diagnosis not present

## 2020-11-16 DIAGNOSIS — Z7901 Long term (current) use of anticoagulants: Secondary | ICD-10-CM | POA: Diagnosis not present

## 2020-11-17 DIAGNOSIS — I480 Paroxysmal atrial fibrillation: Secondary | ICD-10-CM | POA: Diagnosis not present

## 2020-11-17 DIAGNOSIS — Z01818 Encounter for other preprocedural examination: Secondary | ICD-10-CM | POA: Diagnosis not present

## 2020-11-17 DIAGNOSIS — Z7901 Long term (current) use of anticoagulants: Secondary | ICD-10-CM | POA: Diagnosis not present

## 2020-11-17 DIAGNOSIS — E782 Mixed hyperlipidemia: Secondary | ICD-10-CM | POA: Diagnosis not present

## 2020-11-17 DIAGNOSIS — I493 Ventricular premature depolarization: Secondary | ICD-10-CM | POA: Diagnosis not present

## 2020-11-17 DIAGNOSIS — I10 Essential (primary) hypertension: Secondary | ICD-10-CM | POA: Diagnosis not present

## 2020-11-17 DIAGNOSIS — I341 Nonrheumatic mitral (valve) prolapse: Secondary | ICD-10-CM | POA: Diagnosis not present

## 2020-11-21 ENCOUNTER — Other Ambulatory Visit: Payer: Self-pay

## 2020-11-21 ENCOUNTER — Other Ambulatory Visit
Admission: RE | Admit: 2020-11-21 | Discharge: 2020-11-21 | Disposition: A | Discharge status: Home / Self Care | Payer: Medicare Other | Source: Ambulatory Visit | Attending: Internal Medicine | Admitting: Internal Medicine

## 2020-11-21 DIAGNOSIS — I480 Paroxysmal atrial fibrillation: Secondary | ICD-10-CM | POA: Diagnosis not present

## 2020-11-21 DIAGNOSIS — Z7901 Long term (current) use of anticoagulants: Secondary | ICD-10-CM | POA: Diagnosis not present

## 2020-11-23 ENCOUNTER — Ambulatory Visit: Payer: Medicare Other | Admitting: Anesthesiology

## 2020-11-23 ENCOUNTER — Encounter
Admission: RE | Disposition: A | Discharge status: Home / Self Care | Payer: Self-pay | Source: Home / Self Care | Attending: Internal Medicine

## 2020-11-23 ENCOUNTER — Encounter: Payer: Self-pay | Admitting: Internal Medicine

## 2020-11-23 ENCOUNTER — Ambulatory Visit
Admission: RE | Admit: 2020-11-23 | Discharge: 2020-11-23 | Disposition: A | Discharge status: Home / Self Care | Payer: Medicare Other | Attending: Internal Medicine | Admitting: Internal Medicine

## 2020-11-23 ENCOUNTER — Other Ambulatory Visit
Admission: RE | Admit: 2020-11-23 | Discharge: 2020-11-23 | Disposition: A | Discharge status: Home / Self Care | Payer: Medicare Other | Source: Ambulatory Visit | Attending: Internal Medicine | Admitting: Internal Medicine

## 2020-11-23 ENCOUNTER — Other Ambulatory Visit: Payer: Self-pay

## 2020-11-23 DIAGNOSIS — I4891 Unspecified atrial fibrillation: Secondary | ICD-10-CM | POA: Diagnosis not present

## 2020-11-23 DIAGNOSIS — Z20822 Contact with and (suspected) exposure to covid-19: Secondary | ICD-10-CM | POA: Insufficient documentation

## 2020-11-23 DIAGNOSIS — I480 Paroxysmal atrial fibrillation: Secondary | ICD-10-CM | POA: Diagnosis not present

## 2020-11-23 HISTORY — PX: CARDIOVERSION: SHX1299

## 2020-11-23 LAB — SARS CORONAVIRUS 2 BY RT PCR (HOSPITAL ORDER, PERFORMED IN ~~LOC~~ HOSPITAL LAB): SARS Coronavirus 2: NEGATIVE

## 2020-11-23 SURGERY — CARDIOVERSION
Anesthesia: General

## 2020-11-23 MED ORDER — SODIUM CHLORIDE 0.9 % IV SOLN: INTRAVENOUS | Status: DC | PRN

## 2020-11-23 MED ORDER — PROPOFOL 10 MG/ML IV BOLUS
INTRAVENOUS | Status: DC | PRN
  Administered 2020-11-23: 30 mg via INTRAVENOUS
  Administered 2020-11-23: 20 mg via INTRAVENOUS

## 2020-11-23 NOTE — Anesthesia Preprocedure Evaluation (Addendum)
Anesthesia Evaluation  Patient identified by MRN, date of birth, ID band Patient awake    Reviewed: Allergy & Precautions, NPO status , Patient's Chart, lab work & pertinent test results  History of Anesthesia Complications Negative for: history of anesthetic complications  Airway Mallampati: II  TM Distance: <3 FB Neck ROM: Full    Dental no notable dental hx. (+) Teeth Intact   Pulmonary neg sleep apnea, COPD, Patient abstained from smoking.Not current smoker,    Pulmonary exam normal breath sounds clear to auscultation       Cardiovascular Exercise Tolerance: Good METS(-) hypertension(-) CAD and (-) Past MI + dysrhythmias Atrial Fibrillation  Rhythm:Irregular Rate:Normal - Systolic murmurs    Neuro/Psych negative neurological ROS  negative psych ROS   GI/Hepatic neg GERD  ,(+)     (-) substance abuse  ,   Endo/Other  neg diabetes  Renal/GU negative Renal ROS     Musculoskeletal   Abdominal   Peds  Hematology   Anesthesia Other Findings Past Medical History: No date: Chronic sinusitis No date: Fibrocystic breast disease 1999: History of pneumonia No date: Hyperlipidemia No date: Irritable bowel syndrome No date: Lichen planus August 2013: lymphoma     Comment:  Low grade B cell Jan 2012: Mild tricuspid insufficiency     Comment:  ECHO, [place] JAN 2012: Moderate mitral insufficiency     Comment:  ECHO, [facility]  Reproductive/Obstetrics                            Anesthesia Physical Anesthesia Plan  ASA: II  Anesthesia Plan: General   Post-op Pain Management:    Induction: Intravenous  PONV Risk Score and Plan: 3 and Ondansetron, Propofol infusion and TIVA  Airway Management Planned: Nasal Cannula  Additional Equipment: None  Intra-op Plan:   Post-operative Plan:   Informed Consent: I have reviewed the patients History and Physical, chart, labs and discussed the  procedure including the risks, benefits and alternatives for the proposed anesthesia with the patient or authorized representative who has indicated his/her understanding and acceptance.     Dental advisory given  Plan Discussed with: CRNA and Surgeon  Anesthesia Plan Comments: (Discussed risks of anesthesia with patient, including possibility of difficulty with spontaneous ventilation under anesthesia necessitating airway intervention, PONV, and rare risks such as cardiac or respiratory or neurological events. Patient understands.)        Anesthesia Quick Evaluation

## 2020-11-23 NOTE — Anesthesia Postprocedure Evaluation (Signed)
Anesthesia Post Note  Patient: Carol [name]  Procedure(s) Performed: CARDIOVERSION (N/A )  Patient location during evaluation: Specials Recovery Anesthesia Type: General Level of consciousness: awake and alert Pain management: pain level controlled Vital Signs Assessment: post-procedure vital signs reviewed and stable Respiratory status: spontaneous breathing, nonlabored ventilation, respiratory function stable and patient connected to nasal cannula oxygen Cardiovascular status: blood pressure returned to baseline and stable Postop Assessment: no apparent nausea or vomiting Anesthetic complications: no   No complications documented.   Last Vitals:  Vitals:   11/23/20 1204 11/23/20 1205  BP:    Pulse: 61 62  Resp:    Temp:    SpO2: 97% 99%    Last Pain:  Vitals:   11/23/20 1125  TempSrc: Oral                 Arita Miss

## 2020-11-23 NOTE — Transfer of Care (Signed)
Immediate Anesthesia Transfer of Care Note  Patient: Carol [name]  Procedure(s) Performed: CARDIOVERSION (N/A )  Patient Location: Short Stay  Anesthesia Type:General  Level of Consciousness: awake, alert  and oriented  Airway & Oxygen Therapy: Patient Spontanous Breathing and Patient connected to nasal cannula oxygen  Post-op Assessment: Report given to RN and Post -op Vital signs reviewed and stable  Post vital signs: Reviewed and stable  Last Vitals:  Vitals Value Taken Time  BP 102/68   Temp    Pulse 66   Resp 20   SpO2 99     Last Pain:  Vitals:   11/23/20 1125  TempSrc: Oral         Complications: No complications documented.

## 2020-11-23 NOTE — CV Procedure (Signed)
Electrical Cardioversion Procedure Note Carol [name] [id number] [birth year]  Procedure: Electrical Cardioversion Indications:  Paroxysmal non valvular atrial fibrillation  Procedure Details Consent: Risks of procedure as well as the alternatives and risks of each were explained to the (patient/caregiver).  Consent for procedure obtained. Time Out: Verified patient identification, verified procedure, site/side was marked, verified correct patient position, special equipment/implants available, medications/allergies/relevent history reviewed, required imaging and test results available.  Performed  Patient placed on cardiac monitor, pulse oximetry, supplemental oxygen as necessary.  Sedation given: Propofol and versed as per anesthesia  Pacer pads placed anterior and posterior chest.  Cardioverted 1 time(s).  Cardioverted at 120J.  Evaluation Findings: Post procedure EKG shows: NSR Complications: None Patient did tolerate procedure well.   Carol [name] M.D. [facility] 11/23/2020, 12:03 PM

## 2020-11-24 ENCOUNTER — Encounter: Payer: Self-pay | Admitting: Internal Medicine

## 2020-11-30 DIAGNOSIS — I10 Essential (primary) hypertension: Secondary | ICD-10-CM | POA: Diagnosis not present

## 2020-11-30 DIAGNOSIS — I480 Paroxysmal atrial fibrillation: Secondary | ICD-10-CM | POA: Diagnosis not present

## 2020-11-30 DIAGNOSIS — I4891 Unspecified atrial fibrillation: Secondary | ICD-10-CM | POA: Diagnosis not present

## 2020-11-30 DIAGNOSIS — E782 Mixed hyperlipidemia: Secondary | ICD-10-CM | POA: Diagnosis not present

## 2020-12-06 DIAGNOSIS — Z7901 Long term (current) use of anticoagulants: Secondary | ICD-10-CM | POA: Diagnosis not present

## 2020-12-28 DIAGNOSIS — I341 Nonrheumatic mitral (valve) prolapse: Secondary | ICD-10-CM | POA: Diagnosis not present

## 2020-12-28 DIAGNOSIS — I10 Essential (primary) hypertension: Secondary | ICD-10-CM | POA: Diagnosis not present

## 2020-12-28 DIAGNOSIS — I480 Paroxysmal atrial fibrillation: Secondary | ICD-10-CM | POA: Diagnosis not present

## 2021-01-03 DIAGNOSIS — Z7901 Long term (current) use of anticoagulants: Secondary | ICD-10-CM | POA: Diagnosis not present

## 2021-01-04 DIAGNOSIS — I480 Paroxysmal atrial fibrillation: Secondary | ICD-10-CM | POA: Diagnosis not present

## 2021-01-08 DIAGNOSIS — R791 Abnormal coagulation profile: Secondary | ICD-10-CM | POA: Diagnosis not present

## 2021-01-15 ENCOUNTER — Other Ambulatory Visit: Payer: Self-pay

## 2021-01-15 ENCOUNTER — Other Ambulatory Visit
Admission: RE | Admit: 2021-01-15 | Discharge: 2021-01-15 | Disposition: A | Discharge status: Home / Self Care | Payer: Medicare Other | Source: Ambulatory Visit | Attending: Internal Medicine | Admitting: Internal Medicine

## 2021-01-15 DIAGNOSIS — Z20822 Contact with and (suspected) exposure to covid-19: Secondary | ICD-10-CM | POA: Insufficient documentation

## 2021-01-15 DIAGNOSIS — Z01812 Encounter for preprocedural laboratory examination: Secondary | ICD-10-CM | POA: Diagnosis not present

## 2021-01-15 LAB — SARS CORONAVIRUS 2 (TAT 6-24 HRS): SARS Coronavirus 2: NEGATIVE

## 2021-01-17 ENCOUNTER — Ambulatory Visit: Payer: Medicare Other | Admitting: Certified Registered Nurse Anesthetist

## 2021-01-17 ENCOUNTER — Encounter: Payer: Self-pay | Admitting: Internal Medicine

## 2021-01-17 ENCOUNTER — Other Ambulatory Visit: Payer: Self-pay

## 2021-01-17 ENCOUNTER — Encounter
Admission: RE | Disposition: A | Discharge status: Home / Self Care | Payer: Self-pay | Source: Home / Self Care | Attending: Internal Medicine

## 2021-01-17 ENCOUNTER — Ambulatory Visit
Admission: RE | Admit: 2021-01-17 | Discharge: 2021-01-17 | Disposition: A | Discharge status: Home / Self Care | Payer: Medicare Other | Attending: Internal Medicine | Admitting: Internal Medicine

## 2021-01-17 DIAGNOSIS — Z885 Allergy status to narcotic agent status: Secondary | ICD-10-CM | POA: Insufficient documentation

## 2021-01-17 DIAGNOSIS — I499 Cardiac arrhythmia, unspecified: Secondary | ICD-10-CM | POA: Diagnosis not present

## 2021-01-17 DIAGNOSIS — Z803 Family history of malignant neoplasm of breast: Secondary | ICD-10-CM | POA: Diagnosis not present

## 2021-01-17 DIAGNOSIS — Z7901 Long term (current) use of anticoagulants: Secondary | ICD-10-CM | POA: Insufficient documentation

## 2021-01-17 DIAGNOSIS — Z8579 Personal history of other malignant neoplasms of lymphoid, hematopoietic and related tissues: Secondary | ICD-10-CM | POA: Insufficient documentation

## 2021-01-17 DIAGNOSIS — Z881 Allergy status to other antibiotic agents status: Secondary | ICD-10-CM | POA: Insufficient documentation

## 2021-01-17 DIAGNOSIS — Z888 Allergy status to other drugs, medicaments and biological substances status: Secondary | ICD-10-CM | POA: Diagnosis not present

## 2021-01-17 DIAGNOSIS — Z809 Family history of malignant neoplasm, unspecified: Secondary | ICD-10-CM | POA: Diagnosis not present

## 2021-01-17 DIAGNOSIS — I480 Paroxysmal atrial fibrillation: Secondary | ICD-10-CM | POA: Diagnosis not present

## 2021-01-17 DIAGNOSIS — Z8249 Family history of ischemic heart disease and other diseases of the circulatory system: Secondary | ICD-10-CM | POA: Insufficient documentation

## 2021-01-17 DIAGNOSIS — Z79899 Other long term (current) drug therapy: Secondary | ICD-10-CM | POA: Diagnosis not present

## 2021-01-17 DIAGNOSIS — I10 Essential (primary) hypertension: Secondary | ICD-10-CM | POA: Diagnosis not present

## 2021-01-17 DIAGNOSIS — J449 Chronic obstructive pulmonary disease, unspecified: Secondary | ICD-10-CM | POA: Diagnosis not present

## 2021-01-17 DIAGNOSIS — E785 Hyperlipidemia, unspecified: Secondary | ICD-10-CM | POA: Diagnosis not present

## 2021-01-17 DIAGNOSIS — I4891 Unspecified atrial fibrillation: Secondary | ICD-10-CM | POA: Diagnosis not present

## 2021-01-17 HISTORY — PX: CARDIOVERSION: SHX1299

## 2021-01-17 SURGERY — CARDIOVERSION
Anesthesia: General

## 2021-01-17 MED ORDER — PROPOFOL 10 MG/ML IV BOLUS
INTRAVENOUS | Status: DC | PRN
  Administered 2021-01-17 (×2): 10 mg via INTRAVENOUS
  Administered 2021-01-17: 40 mg via INTRAVENOUS

## 2021-01-17 NOTE — Anesthesia Preprocedure Evaluation (Signed)
Anesthesia Evaluation  Patient identified by MRN, date of birth, ID band Patient awake    Reviewed: Allergy & Precautions, NPO status , Patient's Chart, lab work & pertinent test results  History of Anesthesia Complications Negative for: history of anesthetic complications  Airway Mallampati: II       Dental   Pulmonary neg sleep apnea, COPD, Not current smoker,           Cardiovascular (-) hypertension(-) Past MI and (-) CHF + dysrhythmias Atrial Fibrillation (-) Valvular Problems/Murmurs     Neuro/Psych neg Seizures    GI/Hepatic Neg liver ROS, GERD  Medicated and Controlled,  Endo/Other  neg diabetes  Renal/GU negative Renal ROS     Musculoskeletal   Abdominal   Peds  Hematology   Anesthesia Other Findings   Reproductive/Obstetrics                             Anesthesia Physical Anesthesia Plan  ASA: III  Anesthesia Plan: General   Post-op Pain Management:    Induction: Intravenous  PONV Risk Score and Plan: 3 and Propofol infusion and TIVA  Airway Management Planned: Nasal Cannula  Additional Equipment:   Intra-op Plan:   Post-operative Plan:   Informed Consent: I have reviewed the patients History and Physical, chart, labs and discussed the procedure including the risks, benefits and alternatives for the proposed anesthesia with the patient or authorized representative who has indicated his/her understanding and acceptance.       Plan Discussed with:   Anesthesia Plan Comments:         Anesthesia Quick Evaluation

## 2021-01-17 NOTE — Transfer of Care (Signed)
Immediate Anesthesia Transfer of Care Note  Patient: Carol [name]  Procedure(s) Performed: CARDIOVERSION (N/A )  Patient Location: PACU  Anesthesia Type:General  Level of Consciousness: awake, alert  and oriented  Airway & Oxygen Therapy: Patient Spontanous Breathing and Patient connected to nasal cannula oxygen  Post-op Assessment: Report given to RN and Post -op Vital signs reviewed and stable  Post vital signs: Reviewed and stable  Last Vitals:  Vitals Value Taken Time  BP 107/80 01/17/21 0730  Temp    Pulse 83 01/17/21 0734  Resp 32 01/17/21 0734  SpO2 93 % 01/17/21 0734  Vitals shown include unvalidated device data.  Last Pain:  Vitals:   01/17/21 0700  TempSrc: Oral  PainSc: 0-No pain         Complications: No complications documented.

## 2021-01-17 NOTE — CV Procedure (Signed)
Electrical Cardioversion Procedure Note Carol [name] [id number] 05-12-1941  Procedure: Electrical Cardioversion Indications:  Paroxysmal non valvular atrial fibrillation  Procedure Details Consent: Risks of procedure as well as the alternatives and risks of each were explained to the (patient/caregiver).  Consent for procedure obtained. Time Out: Verified patient identification, verified procedure, site/side was marked, verified correct patient position, special equipment/implants available, medications/allergies/relevent history reviewed, required imaging and test results available.  Performed  Patient placed on cardiac monitor, pulse oximetry, supplemental oxygen as necessary.  Sedation given: Propofol and versed as per anesthesia  Pacer pads placed anterior and posterior chest.  Cardioverted 3 time(s).  Cardioverted at [place].  Evaluation Findings: Post procedure EKG shows: NSR Complications: None Patient did tolerate procedure well.   Carol [name] M.D. [facility] 01/17/2021, 7:41 AM

## 2021-01-17 NOTE — Anesthesia Postprocedure Evaluation (Signed)
Anesthesia Post Note  Patient: Carol [name]  Procedure(s) Performed: CARDIOVERSION (N/A )  Patient location during evaluation: Specials Recovery Anesthesia Type: General Level of consciousness: awake and alert Pain management: pain level controlled Vital Signs Assessment: post-procedure vital signs reviewed and stable Respiratory status: spontaneous breathing and respiratory function stable Cardiovascular status: stable Anesthetic complications: no   No complications documented.   Last Vitals:  Vitals:   01/17/21 0734 01/17/21 0745  BP: 103/67 114/72  Pulse: 98 70  Resp: (!) 33 14  Temp:    SpO2: 97% 99%    Last Pain:  Vitals:   01/17/21 0700  TempSrc: Oral  PainSc: 0-No pain                 Archit Leger K

## 2021-01-18 ENCOUNTER — Encounter: Payer: Self-pay | Admitting: Internal Medicine

## 2021-01-22 ENCOUNTER — Telehealth: Payer: Self-pay | Admitting: Internal Medicine

## 2021-01-22 NOTE — Telephone Encounter (Signed)
Patient stated she would go to an UC per access nurse.     [place] RECORD AccessNurse Patient Name: Carol [name] Gender: Female DOB: 27-Jun-1941 Age: 80 Y 68 M 20 D Return Phone Number: [phone] (Primary) Address: City/State/Zip: [place] [address] Client [facility] - [place] Client Site [place] - Day Physician [name] - MD Contact Type Call Who Is Calling Patient / Member / Family / Caregiver Call Type Triage / Clinical Relationship To Patient Self Return Phone Number [phone] (Primary) Chief Complaint BREATHING - shortness of breath or sounds breathless Reason for Call Symptomatic / Request for [place] said she is having SOB and thinks its due to cardiac averson she had last week. Caller said she spoke to the cardiologist which said he could provide an inhaler and that she would need to get it from her PCP. Caller said her heart is in rythnm. [facility] Translation No Nurse Assessment Nurse: Leilani Merl, RN, Heather Date/Time (Eastern Time): 01/22/2021 10:16:56 AM Confirm and document reason for call. If symptomatic, describe symptoms. ---Caller said she is having SOB and thinks its due to cardioversion she had last week. Caller said she spoke to the cardiologist which said he could provide an inhaler and that she would need to get it from her PCP. Caller said her heart is in rhythm. Does the patient have any new or worsening symptoms? ---Yes Will a triage be completed? ---Yes Related visit to physician within the last 2 weeks? ---No Does the PT have any chronic conditions? (i.e. diabetes, asthma, this includes High risk factors for pregnancy, etc.) ---Yes List chronic conditions. ---A-fib Is this a behavioral health or substance abuse call?  ---No Guidelines Guideline Title Affirmed Question Affirmed Notes Nurse Date/Time (Eastern Time) Breathing Difficulty [1] MILD difficulty breathing (e.g., minimal/ no SOB at rest, SOB with walking, pulse <100) Standifer, RN, Nira Conn 01/22/2021 10:21:09 AM PLEASE NOTE: All timestamps contained within this report are represented as [place] Standard Time. CONFIDENTIALTY NOTICE: This fax transmission is intended only for the addressee. It contains information that is legally privileged, confidential or otherwise protected from use or disclosure. If you are not the intended recipient, you are strictly prohibited from reviewing, disclosing, copying using or disseminating any of this information or taking any action in reliance on or regarding this information. If you have received this fax in error, please notify us immediately by telephone so that we can arrange for its return to [place]. Phone: [phone], Toll-Free: [phone], Fax: [phone] Page: 2 of 2 Call Id: [id number] Guidelines Guideline Title Affirmed Question Affirmed Notes Nurse Date/Time Eilene Ghazi Time) AND [2] NEW-onset or WORSE than normal Disp. Time Eilene Ghazi Time) Disposition Final User 01/22/2021 10:13:54 AM Send to Urgent Queue Sundra Aland 2/70/3500 93:81:82 AM See HCP within 4 Hours (or PCP triage) Yes Standifer, RN, Soyla Murphy Disagree/Comply Comply Caller Understands Yes PreDisposition Call Doctor Care Advice Given Per Guideline SEE HCP (OR PCP TRIAGE) WITHIN 4 HOURS: * IF OFFICE WILL BE OPEN: You need to be seen within the next 3 or 4 hours. Call your doctor (or NP/PA) now or as soon as the office opens. CALL BACK IF: * You become worse CARE ADVICE given per Breathing Difficulty (Adult) guideline. Comments User: Ave Filter, RN Date/Time Eilene Ghazi Time): 01/22/2021 10:31:34 AM Caller states that she will probably go to [place] -  SPECIFY

## 2021-01-25 DIAGNOSIS — I480 Paroxysmal atrial fibrillation: Secondary | ICD-10-CM | POA: Diagnosis not present

## 2021-01-25 DIAGNOSIS — I4891 Unspecified atrial fibrillation: Secondary | ICD-10-CM | POA: Diagnosis not present

## 2021-01-26 ENCOUNTER — Other Ambulatory Visit: Payer: Self-pay

## 2021-01-26 ENCOUNTER — Other Ambulatory Visit
Admission: RE | Admit: 2021-01-26 | Discharge: 2021-01-26 | Disposition: A | Discharge status: Home / Self Care | Payer: Medicare Other | Source: Ambulatory Visit | Attending: Internal Medicine | Admitting: Internal Medicine

## 2021-01-26 DIAGNOSIS — I5023 Acute on chronic systolic (congestive) heart failure: Secondary | ICD-10-CM | POA: Diagnosis not present

## 2021-01-26 DIAGNOSIS — Z20822 Contact with and (suspected) exposure to covid-19: Secondary | ICD-10-CM | POA: Diagnosis not present

## 2021-01-26 DIAGNOSIS — J9601 Acute respiratory failure with hypoxia: Secondary | ICD-10-CM | POA: Diagnosis not present

## 2021-01-26 DIAGNOSIS — Z01812 Encounter for preprocedural laboratory examination: Secondary | ICD-10-CM | POA: Insufficient documentation

## 2021-01-26 DIAGNOSIS — I4891 Unspecified atrial fibrillation: Secondary | ICD-10-CM | POA: Diagnosis not present

## 2021-01-26 DIAGNOSIS — I480 Paroxysmal atrial fibrillation: Secondary | ICD-10-CM | POA: Diagnosis not present

## 2021-01-26 DIAGNOSIS — J90 Pleural effusion, not elsewhere classified: Secondary | ICD-10-CM | POA: Diagnosis not present

## 2021-01-26 DIAGNOSIS — E871 Hypo-osmolality and hyponatremia: Secondary | ICD-10-CM | POA: Diagnosis not present

## 2021-01-26 DIAGNOSIS — R0602 Shortness of breath: Secondary | ICD-10-CM | POA: Diagnosis not present

## 2021-01-26 DIAGNOSIS — D72829 Elevated white blood cell count, unspecified: Secondary | ICD-10-CM | POA: Diagnosis not present

## 2021-01-26 DIAGNOSIS — I110 Hypertensive heart disease with heart failure: Secondary | ICD-10-CM | POA: Diagnosis not present

## 2021-01-26 DIAGNOSIS — I517 Cardiomegaly: Secondary | ICD-10-CM | POA: Diagnosis not present

## 2021-01-27 LAB — SARS CORONAVIRUS 2 (TAT 6-24 HRS): SARS Coronavirus 2: NEGATIVE

## 2021-01-29 ENCOUNTER — Emergency Department: Payer: Medicare Other

## 2021-01-29 ENCOUNTER — Encounter: Payer: Self-pay | Admitting: Emergency Medicine

## 2021-01-29 ENCOUNTER — Other Ambulatory Visit: Payer: Self-pay

## 2021-01-29 ENCOUNTER — Inpatient Hospital Stay
Admission: EM | Admit: 2021-01-29 | Discharge: 2021-02-02 | DRG: 291 | Disposition: A | Discharge status: Other Acute Inpatient Hospital | Payer: Medicare Other | Attending: Internal Medicine | Admitting: Internal Medicine

## 2021-01-29 DIAGNOSIS — Z8572 Personal history of non-Hodgkin lymphomas: Secondary | ICD-10-CM

## 2021-01-29 DIAGNOSIS — R600 Localized edema: Secondary | ICD-10-CM | POA: Diagnosis present

## 2021-01-29 DIAGNOSIS — E7800 Pure hypercholesterolemia, unspecified: Secondary | ICD-10-CM | POA: Diagnosis not present

## 2021-01-29 DIAGNOSIS — Z83438 Family history of other disorder of lipoprotein metabolism and other lipidemia: Secondary | ICD-10-CM

## 2021-01-29 DIAGNOSIS — Z79899 Other long term (current) drug therapy: Secondary | ICD-10-CM

## 2021-01-29 DIAGNOSIS — E871 Hypo-osmolality and hyponatremia: Secondary | ICD-10-CM | POA: Diagnosis present

## 2021-01-29 DIAGNOSIS — J9601 Acute respiratory failure with hypoxia: Secondary | ICD-10-CM | POA: Diagnosis present

## 2021-01-29 DIAGNOSIS — R7989 Other specified abnormal findings of blood chemistry: Secondary | ICD-10-CM | POA: Diagnosis present

## 2021-01-29 DIAGNOSIS — Z20822 Contact with and (suspected) exposure to covid-19: Secondary | ICD-10-CM | POA: Diagnosis present

## 2021-01-29 DIAGNOSIS — I480 Paroxysmal atrial fibrillation: Secondary | ICD-10-CM | POA: Diagnosis present

## 2021-01-29 DIAGNOSIS — D72829 Elevated white blood cell count, unspecified: Secondary | ICD-10-CM | POA: Diagnosis present

## 2021-01-29 DIAGNOSIS — I5043 Acute on chronic combined systolic (congestive) and diastolic (congestive) heart failure: Secondary | ICD-10-CM | POA: Diagnosis not present

## 2021-01-29 DIAGNOSIS — Z841 Family history of disorders of kidney and ureter: Secondary | ICD-10-CM

## 2021-01-29 DIAGNOSIS — E785 Hyperlipidemia, unspecified: Secondary | ICD-10-CM | POA: Diagnosis present

## 2021-01-29 DIAGNOSIS — I517 Cardiomegaly: Secondary | ICD-10-CM | POA: Diagnosis not present

## 2021-01-29 DIAGNOSIS — R791 Abnormal coagulation profile: Secondary | ICD-10-CM | POA: Diagnosis present

## 2021-01-29 DIAGNOSIS — I509 Heart failure, unspecified: Secondary | ICD-10-CM

## 2021-01-29 DIAGNOSIS — K589 Irritable bowel syndrome without diarrhea: Secondary | ICD-10-CM | POA: Diagnosis present

## 2021-01-29 DIAGNOSIS — I4891 Unspecified atrial fibrillation: Secondary | ICD-10-CM | POA: Diagnosis present

## 2021-01-29 DIAGNOSIS — Z7901 Long term (current) use of anticoagulants: Secondary | ICD-10-CM | POA: Diagnosis not present

## 2021-01-29 DIAGNOSIS — E861 Hypovolemia: Secondary | ICD-10-CM | POA: Diagnosis present

## 2021-01-29 DIAGNOSIS — Z8249 Family history of ischemic heart disease and other diseases of the circulatory system: Secondary | ICD-10-CM | POA: Diagnosis not present

## 2021-01-29 DIAGNOSIS — R0602 Shortness of breath: Secondary | ICD-10-CM | POA: Diagnosis not present

## 2021-01-29 DIAGNOSIS — I110 Hypertensive heart disease with heart failure: Principal | ICD-10-CM | POA: Diagnosis present

## 2021-01-29 DIAGNOSIS — I5031 Acute diastolic (congestive) heart failure: Secondary | ICD-10-CM | POA: Diagnosis not present

## 2021-01-29 DIAGNOSIS — J90 Pleural effusion, not elsewhere classified: Secondary | ICD-10-CM | POA: Diagnosis not present

## 2021-01-29 DIAGNOSIS — I5023 Acute on chronic systolic (congestive) heart failure: Secondary | ICD-10-CM | POA: Diagnosis present

## 2021-01-29 HISTORY — DX: Unspecified atrial fibrillation: I48.91

## 2021-01-29 LAB — BASIC METABOLIC PANEL
Anion gap: 14 (ref 5–15)
BUN: 24 mg/dL — ABNORMAL HIGH (ref 8–23)
CO2: 20 mmol/L — ABNORMAL LOW (ref 22–32)
Calcium: 9.3 mg/dL (ref 8.9–10.3)
Chloride: 86 mmol/L — ABNORMAL LOW (ref 98–111)
Creatinine, Ser: 0.93 mg/dL (ref 0.44–1.00)
GFR, Estimated: >60 mL/min (ref >60)
Glucose, Bld: 155 mg/dL — ABNORMAL HIGH (ref 70–99)
Potassium: 4.3 mmol/L (ref 3.5–5.1)
Sodium: 120 mmol/L — ABNORMAL LOW (ref 135–145)

## 2021-01-29 LAB — PROTIME-INR
INR: 1.7 — ABNORMAL HIGH (ref 0.8–1.2)
Prothrombin Time: 19.5 seconds — ABNORMAL HIGH (ref 11.4–15.2)

## 2021-01-29 LAB — PROCALCITONIN: Procalcitonin: <0.1 ng/mL

## 2021-01-29 LAB — TROPONIN I (HIGH SENSITIVITY)
Troponin I (High Sensitivity): 18 ng/L — ABNORMAL HIGH (ref <18)
Troponin I (High Sensitivity): 17 ng/L (ref <18)

## 2021-01-29 LAB — CBC
HCT: 40.6 % (ref 36.0–46.0)
Hemoglobin: 14.1 g/dL (ref 12.0–15.0)
MCH: 31.5 pg (ref 26.0–34.0)
MCHC: 34.7 g/dL (ref 30.0–36.0)
MCV: 90.6 fL (ref 80.0–100.0)
Platelets: 296 10*3/uL (ref 150–400)
RBC: 4.48 MIL/uL (ref 3.87–5.11)
RDW: 13.7 % (ref 11.5–15.5)
WBC: 11.7 10*3/uL — ABNORMAL HIGH (ref 4.0–10.5)
nRBC: 0 % (ref 0.0–0.2)

## 2021-01-29 LAB — HEPATIC FUNCTION PANEL
ALT: 100 U/L — ABNORMAL HIGH (ref 0–44)
AST: 83 U/L — ABNORMAL HIGH (ref 15–41)
Albumin: 3.7 g/dL (ref 3.5–5.0)
Alkaline Phosphatase: 77 U/L (ref 38–126)
Bilirubin, Direct: 0.3 mg/dL — ABNORMAL HIGH (ref 0.0–0.2)
Indirect Bilirubin: 1.1 mg/dL — ABNORMAL HIGH (ref 0.3–0.9)
Total Bilirubin: 1.4 mg/dL — ABNORMAL HIGH (ref 0.3–1.2)
Total Protein: 6.8 g/dL (ref 6.5–8.1)

## 2021-01-29 LAB — APTT: aPTT: 29 seconds (ref 24–36)

## 2021-01-29 LAB — OSMOLALITY: Osmolality: 261 mOsm/kg — ABNORMAL LOW (ref 275–295)

## 2021-01-29 LAB — RESP PANEL BY RT-PCR (FLU A&B, COVID) ARPGX2
Influenza A by PCR: NEGATIVE
Influenza B by PCR: NEGATIVE
SARS Coronavirus 2 by RT PCR: NEGATIVE

## 2021-01-29 LAB — BRAIN NATRIURETIC PEPTIDE: B Natriuretic Peptide: 3453 pg/mL — ABNORMAL HIGH (ref 0.0–100.0)

## 2021-01-29 MED ORDER — HEPARIN (PORCINE) 25000 UT/250ML-% IV SOLN
800.0000 [IU]/h | INTRAVENOUS | Status: DC
  Administered 2021-01-29: 1100 [IU]/h via INTRAVENOUS
  Administered 2021-01-30: 950 [IU]/h via INTRAVENOUS
  Administered 2021-01-31: 800 [IU]/h via INTRAVENOUS
  Filled 2021-01-29 (×3): qty 250

## 2021-01-29 MED ORDER — FUROSEMIDE 10 MG/ML IJ SOLN
20.0000 mg | Freq: Once | INTRAMUSCULAR | Status: AC
  Administered 2021-01-29: 20 mg via INTRAVENOUS
  Filled 2021-01-29: qty 4

## 2021-01-29 NOTE — Consult Note (Signed)
ANTICOAGULATION CONSULT NOTE - Initial Consult  Pharmacy Consult for heparin infusion Indication: atrial fibrillation  Allergies  Allergen Reactions  . Prednisone Palpitations    A. fib  . Pulmicort [Budesonide] Shortness Of Breath, Itching, Swelling and Other (See Comments)    Felt as if things were crawly on her  . Clonidine Other (See Comments)    Pt felt like things were crawling on her arms.  Amador Cunas [Loteprednol Etabonate]     Broke out around the eye  . Morphine And Related Itching and Other (See Comments)    "creepy crawly feeling"  . Trovan [Alatrofloxacin Mesylate] Other (See Comments)    "effected the whole system"  . Zelnorm [Tegaserod Maleate] Itching    Felt as if things were crawly on her   . Erythromycin Rash    Patient Measurements: Height: 5\' 6"  (167.6 cm) Weight: 77.1 kg (170 lb) IBW/kg (Calculated) : 59.3 Heparin Dosing Weight: 75 kg   Vital Signs: Temp: 97.6 F (36.4 C) (03/28 1811) Temp Source: Oral (03/28 1811) BP: 111/89 (03/28 1811) Pulse Rate: 78 (03/28 1811)  Labs: Recent Labs    01/29/21 1819  HGB 14.1  HCT 40.6  PLT 296  APTT 29  LABPROT 19.5*  INR 1.7*  CREATININE 0.93    Estimated Creatinine Clearance: 50.6 mL/min (by C-G formula based on SCr of 0.93 mg/dL).   Medical History: Past Medical History:  Diagnosis Date  . A-fib ([place])   . Chronic sinusitis   . Fibrocystic breast disease   . History of pneumonia 1999  . Hyperlipidemia   . Irritable bowel syndrome   . Lichen planus   . 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 Mild tricuspid insufficiency Jan 2012   ECHO, [place]  . Moderate mitral insufficiency JAN 2012   ECHO, [facility]    Medications:  Warfarin PTA: Warfarin 1.5 mg on Sunday and Wed and 3 mg all other days (source: cardiology office visit note 01/25/21) INR goal: 2-3   Assessment: 80 y.o. female with history of A. fib, on chronic coumadin, status post ablation presented with increasing SOB and  planned cardioversion 01/30/21. Pharmacy has been consulted to transition patient from coumadin to IV heparin for pending cardioversion.   Baseline labs ordered and reviewed. INR 1.7  Heparin Dosing Weight: 75 kg  Goal of Therapy:  Heparin level 0.3-0.7 units/ml Monitor platelets by anticoagulation protocol: Yes   Plan:  Withhold bolus and start heparin infusion at 1100 units/hr Check anti-Xa (heparin) level in 8 hours and daily while on heparin Continue to monitor H&H and platelets  Dorothe Pea, PharmD, BCPS Clinical Pharmacist  01/29/2021,7:05 PM

## 2021-01-29 NOTE — ED Notes (Signed)
Pt awake and alert- oriented x4.  Does not verbalize pain c/o at this time.  Placed on 2L O2 via [place] by reporting nurse d/t O2 desat to 80s - =improved to 99-100% on 2L O2 via [place].  Cardiac and continuous monitoring maintained- controlled Afib.  S1 and S2 irregular.  Abdomen soft nontender to palpation; nondistended.  BLE +3 pitting edema with varicose veins noted to BLE.  Hep drip now infusing @1100units /hr to 20G L FA.  R chest power port accessed since shift change by this nurse - 20G 1 inch -- bright red blood returned; flushed with 10cc NS.  Well tolerated by pt

## 2021-01-29 NOTE — H&P (Signed)
History and Physical   Carol [name] OFB:[id number] DOB: [birth year] DOA: 01/29/2021  Referring MD/NP/PA: Dr. Ellender Hose  PCP: Crecencio Mc, MD   Outpatient Specialists: Dr. Nehemiah Massed  Patient coming from: Home  Chief Complaint: Shortness of breath and palpitations  HPI: Carol [name] is a 80 y.o. female with medical history significant of atrial fibrillation, on chronic Coumadin therapy, irritable bowel syndrome, history of lymphoma, fibrocystic breast disease who is scheduled for ablation of her A. fib tomorrow.  She has had also previous follow-up with [facility]  Patient came in due to worsening shortness of breath and worsening lower extremity edema and overall feeling bad.  She was found to be in rapid atrial fibrillation.  She also has significant weakness and appears to have worsening congestive heart failure.  She is being admitted to the hospital for further work-up.  Patient complained of some PND some on orthopnea.  No fever or chills she has some cough..  ED Course: Temperature 97.6 blood pressure 116/85 pulse 70 respirate 33 oxygen sats 95% on room air.  White count is 11.7 sodium 120 potassium 4.3, chloride 86 CO2 20 BUN 24 creatinine 0.93 calcium 9.3.  BNP is 3400.  Troponin XVIII osmolality 261.  White count 11.7.  PT 19.5 INR 1.7.  Respiratory panel is negative.  Chest x-ray shows cardiomegaly with small bilateral pleural effusions and basilar airspace disease probably atelectasis.  Patient is being admitted for further evaluation and treatment.  Review of Systems: As per HPI otherwise 10 point review of systems negative.    Past Medical History:  Diagnosis Date  . A-fib ([place])   . Chronic sinusitis   . Fibrocystic breast disease   . History of pneumonia 1999  . Hyperlipidemia   . Irritable bowel syndrome   . Lichen planus   . 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 Mild tricuspid insufficiency Jan 2012   ECHO, [place]  . Moderate mitral  insufficiency JAN 2012   ECHO, [place]    Past Surgical History:  Procedure Laterality Date  . ABDOMINAL HYSTERECTOMY     precancerous cervix,    . ABLATION OF DYSRHYTHMIC FOCUS  August 2013   [facility], Dr. Marcello Moores  . BREAST SURGERY     bilateral, benign biopsies  . CARDIOVERSION N/A 11/23/2020   Procedure: CARDIOVERSION;  Surgeon: Corey Skains, MD;  Location: [facility] ORS;  Service: Cardiovascular;  Laterality: N/A;  . CARDIOVERSION N/A 01/17/2021   Procedure: CARDIOVERSION;  Surgeon: Corey Skains, MD;  Location: [facility] ORS;  Service: Cardiovascular;  Laterality: N/A;  . MAXILLARY SINUS LIFT  1990's   [name]  . oophorectomy    . squamous cell removal Right    right leg calf area.     reports that she has never smoked. She has never used smokeless tobacco. She reports current alcohol use of about 4.0 standard drinks of alcohol per week. She reports that she does not use drugs.  Allergies  Allergen Reactions  . Prednisone Palpitations    A. fib  . Pulmicort [Budesonide] Shortness Of Breath, Itching, Swelling and Other (See Comments)    Felt as if things were crawly on her  . Clonidine Other (See Comments)    Pt felt like things were crawling on her arms.  Amador Cunas [Loteprednol Etabonate]     Broke out around the eye  . Morphine And Related Itching and Other (See Comments)    "creepy crawly feeling"  . Trovan [Alatrofloxacin Mesylate]  Other (See Comments)    "effected the whole system"  . Zelnorm [Tegaserod Maleate] Itching    Felt as if things were crawly on her   . Erythromycin Rash    Family History  Problem Relation Age of Onset  . Cancer Mother        Breast  . Hyperlipidemia Father   . Heart disease Father   . Hypertension Father   . Kidney disease Father   . Cancer Maternal Grandfather        colon CA  . Diverticulitis Sister      Prior to Admission medications   Medication Sig Start Date End Date Taking? Authorizing Provider  acetaminophen  (TYLENOL) 500 MG tablet Take 500 mg by mouth every 6 (six) hours as needed for moderate pain or headache.   Yes [provider]  amiodarone (PACERONE) 200 MG tablet Take 1 tablet (200 mg total) by mouth 2 (two) times daily.   Yes [provider]  b complex vitamins capsule Take 1 capsule by mouth daily.   Yes [provider]  bisacodyl (DULCOLAX) 5 MG EC tablet Take 5 mg by mouth daily as needed for moderate constipation.   Yes [provider]  Cholecalciferol (VITAMIN D3) 125 MCG (5000 UT) TABS Take 5,000 Units by mouth daily.   Yes [provider]  diltiazem (CARDIZEM) 60 MG tablet Take 60 mg by mouth 3 (three) times daily.   Yes [provider]  diphenhydrAMINE (BENADRYL) 25 mg capsule Take 25 mg by mouth every 6 (six) hours as needed for allergies.   Yes [provider]  docusate sodium (COLACE) 100 MG capsule Take 100 mg by mouth daily as needed for mild constipation.   Yes [provider]  furosemide (LASIX) 20 MG tablet TAKE 1 TABLET EVERY DAY Patient taking differently: Take 20 mg by mouth daily. 08/28/20  Yes Crecencio Mc, MD  Glucosamine-Chondroit-Vit C-Mn (GLUCOSAMINE 1500 COMPLEX PO) Take 1,500 mg by mouth daily.   Yes [provider]  Glycerin-Polysorbate 80 (REFRESH DRY EYE THERAPY OP) Place 1 drop into both eyes 2 (two) times daily.   Yes [provider]  magnesium oxide (MAG-OX) 400 MG tablet Take 400 mg by mouth in the morning and at bedtime.   Yes [provider]  metoprolol succinate (TOPROL-XL) 25 MG 24 hr tablet Take 50 mg by mouth in the morning and at bedtime. 11/17/20  Yes [provider]  Multiple Vitamin (MULTIVITAMIN WITH MINERALS) TABS tablet Take 0.5 tablets by mouth 2 (two) times daily.   Yes [provider]  Omega-3 Fatty Acids (FISH OIL) 1000 MG CAPS Take 2,000 mg by mouth in the morning, at noon, and at bedtime.   Yes [provider]   omeprazole (PRILOSEC) 20 MG capsule TAKE 1 CAPSULE EVERY DAY Patient taking differently: Take 20 mg by mouth daily before breakfast. 08/28/20  Yes Crecencio Mc, MD  vitamin E 1000 UNIT capsule Take 1,000 Units by mouth 2 (two) times daily.   Yes [provider]  warfarin (COUMADIN) 3 MG tablet Take 1.5-3 mg by mouth See admin instructions. Take 1.5 mg Fridays and Sundays and 3 mg other days of the week. (Takes at night)   Yes [provider]  albuterol (PROVENTIL HFA;VENTOLIN HFA) 108 (90 BASE) MCG/ACT inhaler Inhale 2 puffs into the lungs every 6 (six) hours as needed. Patient not taking: No sig reported 05/15/15   Crecencio Mc, MD  benzonatate (TESSALON) 200 MG capsule  Take 1 capsule (200 mg total) by mouth 3 (three) times daily as needed for cough. Patient not taking: No sig reported 10/31/20   Crecencio Mc, MD  doxycycline (VIBRA-TABS) 100 MG tablet Take 1 tablet (100 mg total) by mouth 2 (two) times daily. Patient not taking: Reported on 01/29/2021 10/31/20   Crecencio Mc, MD  levalbuterol [facility] HFA) 45 MCG/ACT inhaler Inhale 1-2 puffs into the lungs every 4 (four) hours as needed for wheezing. 08/13/12 08/13/12  Crecencio Mc, MD    Physical Exam: Vitals:   01/29/21 1808 01/29/21 1811 01/29/21 1946 01/29/21 2100  BP:  111/89 116/85 (!) 112/92  Pulse:  78 77 (!) 35  Resp:  (!) 24 (!) 29 (!) 33  Temp:  97.6 F (36.4 C)    TempSrc:  Oral    SpO2:  100% 98% 95%  Weight: 77.1 kg     Height: 5\' 6"  (1.676 m)         Constitutional: Acutely ill looking no distress Vitals:   01/29/21 1808 01/29/21 1811 01/29/21 1946 01/29/21 2100  BP:  111/89 116/85 (!) 112/92  Pulse:  78 77 (!) 35  Resp:  (!) 24 (!) 29 (!) 33  Temp:  97.6 F (36.4 C)    TempSrc:  Oral    SpO2:  100% 98% 95%  Weight: 77.1 kg     Height: 5\' 6"  (1.676 m)      Eyes: PERRL, lids and conjunctivae normal ENMT: Mucous membranes are moist. Posterior pharynx clear of any exudate or  lesions.Normal dentition.  Neck: normal, supple, no masses, no thyromegaly Respiratory: Coarse breath sounds with diffuse crackles normal respiratory effort. No accessory muscle use.  Cardiovascular: Regular rate and rhythm, no murmurs / rubs / gallops.  3+ extremity edema, 2+ pedal pulses. No carotid bruits.  Abdomen: no tenderness, no masses palpated. No hepatosplenomegaly. Bowel sounds positive.  Musculoskeletal: no clubbing / cyanosis. No joint deformity upper and lower extremities. Good ROM, no contractures. Normal muscle tone.  Skin: no rashes, lesions, ulcers. No induration Neurologic: CN 2-12 grossly intact. Sensation intact, DTR normal. Strength 5/5 in all 4.  Psychiatric: Normal judgment and insight. Alert and oriented x 3. Normal mood.     Labs on Admission: I have personally reviewed following labs and imaging studies  CBC: Recent Labs  Lab 01/29/21 1819  WBC 11.7*  HGB 14.1  HCT 40.6  MCV 90.6  PLT 710   Basic Metabolic Panel: Recent Labs  Lab 01/29/21 1819  NA 120*  K 4.3  CL 86*  CO2 20*  GLUCOSE 155*  BUN 24*  CREATININE 0.93  CALCIUM 9.3   GFR: Estimated Creatinine Clearance: 50.6 mL/min (by C-G formula based on SCr of 0.93 mg/dL). Liver Function Tests: Recent Labs  Lab 01/29/21 1819  AST 83*  ALT 100*  ALKPHOS 77  BILITOT 1.4*  PROT 6.8  ALBUMIN 3.7   No results for input(s): LIPASE, AMYLASE in the last 168 hours. No results for input(s): AMMONIA in the last 168 hours. Coagulation Profile: Recent Labs  Lab 01/29/21 1819  INR 1.7*   Cardiac Enzymes: No results for input(s): CKTOTAL, CKMB, CKMBINDEX, TROPONINI in the last 168 hours. BNP (last 3 results) No results for input(s): PROBNP in the last 8760 hours. HbA1C: No results for input(s): HGBA1C in the last 72 hours. CBG: No results for input(s): GLUCAP in the last 168 hours. Lipid Profile: No results for input(s): CHOL, HDL, LDLCALC, TRIG, CHOLHDL, LDLDIRECT in the last  72  hours. Thyroid Function Tests: No results for input(s): TSH, T4TOTAL, FREET4, T3FREE, THYROIDAB in the last 72 hours. Anemia Panel: No results for input(s): VITAMINB12, FOLATE, FERRITIN, TIBC, IRON, RETICCTPCT in the last 72 hours. Urine analysis:    Component Value Date/Time   COLORURINE YELLOW (A) 09/29/2017 2320   APPEARANCEUR HAZY (A) 09/29/2017 2320   LABSPEC 1.004 (L) 09/29/2017 2320   PHURINE 7.0 09/29/2017 2320   GLUCOSEU NEGATIVE 09/29/2017 2320   HGBUR NEGATIVE 09/29/2017 2320   BILIRUBINUR NEGATIVE 09/29/2017 2320   KETONESUR NEGATIVE 09/29/2017 2320   PROTEINUR NEGATIVE 09/29/2017 2320   NITRITE NEGATIVE 09/29/2017 2320   LEUKOCYTESUR NEGATIVE 09/29/2017 2320   Sepsis Labs: @LABRCNTIP (procalcitonin:4,lacticidven:4) ) Recent Results (from the past 240 hour(s))  SARS CORONAVIRUS 2 (TAT 6-24 HRS) Nasopharyngeal Nasopharyngeal Swab     Status: None   Collection Time: 01/26/21 11:53 AM   Specimen: Nasopharyngeal Swab  Result Value Ref Range Status   SARS Coronavirus 2 NEGATIVE NEGATIVE Final    Comment: (NOTE) SARS-CoV-2 target nucleic acids are NOT DETECTED.  The SARS-CoV-2 RNA is generally detectable in upper and lower respiratory specimens during the acute phase of infection. Negative results do not preclude SARS-CoV-2 infection, do not rule out co-infections with other pathogens, and should not be used as the sole basis for treatment or other patient management decisions. Negative results must be combined with clinical observations, patient history, and epidemiological information. The expected result is Negative.  Fact Sheet for Patients: SugarRoll.be  Fact Sheet for Healthcare Providers: https://www.woods-mathews.com/  This test is not yet approved or cleared by the [place] FDA and  has been authorized for detection and/or diagnosis of SARS-CoV-2 by FDA under an Emergency Use Authorization (EUA). This EUA will  remain  in effect (meaning this test can be used) for the duration of the COVID-19 declaration under Se ction 564(b)(1) of the Act, 21 U.S.C. section 360bbb-3(b)(1), unless the authorization is terminated or revoked sooner.  Performed at [facility], [place] [address] [place], [place] [address]   Resp Panel by RT-PCR (Flu A&B, Covid) Nasopharyngeal Swab     Status: None   Collection Time: 01/29/21  7:46 PM   Specimen: Nasopharyngeal Swab; Nasopharyngeal(NP) swabs in vial transport medium  Result Value Ref Range Status   SARS Coronavirus 2 by RT PCR NEGATIVE NEGATIVE Final    Comment: (NOTE) SARS-CoV-2 target nucleic acids are NOT DETECTED.  The SARS-CoV-2 RNA is generally detectable in upper respiratory specimens during the acute phase of infection. The lowest concentration of SARS-CoV-2 viral copies this assay can detect is 138 copies/mL. A negative result does not preclude SARS-Cov-2 infection and should not be used as the sole basis for treatment or other patient management decisions. A negative result may occur with  improper specimen collection/handling, submission of specimen other than nasopharyngeal swab, presence of viral mutation(s) within the areas targeted by this assay, and inadequate number of viral copies(<138 copies/mL). A negative result must be combined with clinical observations, patient history, and epidemiological information. The expected result is Negative.  Fact Sheet for Patients:  EntrepreneurPulse.com.au  Fact Sheet for Healthcare Providers:  IncredibleEmployment.be  This test is no t yet approved or cleared by the [place] FDA and  has been authorized for detection and/or diagnosis of SARS-CoV-2 by FDA under an Emergency Use Authorization (EUA). This EUA will remain  in effect (meaning this test can be used) for the duration of the COVID-19 declaration under Section 564(b)(1) of the Act, 21 U.S.C.section  360bbb-3(b)(1), unless the authorization is terminated  or revoked sooner.       Influenza A by PCR NEGATIVE NEGATIVE Final   Influenza B by PCR NEGATIVE NEGATIVE Final    Comment: (NOTE) The Xpert Xpress SARS-CoV-2/FLU/RSV plus assay is intended as an aid in the diagnosis of influenza from Nasopharyngeal swab specimens and should not be used as a sole basis for treatment. Nasal washings and aspirates are unacceptable for Xpert Xpress SARS-CoV-2/FLU/RSV testing.  Fact Sheet for Patients: EntrepreneurPulse.com.au  Fact Sheet for Healthcare Providers: IncredibleEmployment.be  This test is not yet approved or cleared by the [place] FDA and has been authorized for detection and/or diagnosis of SARS-CoV-2 by FDA under an Emergency Use Authorization (EUA). This EUA will remain in effect (meaning this test can be used) for the duration of the COVID-19 declaration under Section 564(b)(1) of the Act, 21 U.S.C. section 360bbb-3(b)(1), unless the authorization is terminated or revoked.  Performed at [facility], [address] [place], [place] [address]      Radiological Exams on Admission: DG Chest 2 View  Result Date: 01/29/2021 CLINICAL DATA:  Shortness of breath EXAM: CHEST - 2 VIEW COMPARISON:  10/31/2020 FINDINGS: Right-sided central venous port tip over the distal SVC. Cardiomegaly with small bilateral pleural effusions and basilar airspace disease. No pneumothorax. IMPRESSION: Cardiomegaly with small bilateral pleural effusions and basilar airspace disease, atelectasis versus pneumonia. Electronically Signed   By: Donavan Foil M.D.   On: 01/29/2021 19:03    EKG: Independently reviewed.  It shows atrial fibrillation now with controlled rate of 86.  No significant ST changes.  Assessment/Plan Principal Problem:   Rapid atrial fibrillation (HCC) Active Problems:   Hyperlipidemia   CHF exacerbation (HCC)   Leg edema    Hyponatremia     #1 decompensated atrial fibrillation: Patient has been on warfarin but subtherapeutic INR.  IV heparin initiated.  Continue warfarin dosing by pharmacy.  We will bridge in this case.  Continue rate control.  Cardiology to be consulted in the morning.  Dr. Nehemiah Massed planning to do ablation tomorrow.  Patient may be stable enough to proceed.  #2 decompensated congestive heart failure: No recent echo in the system.  Patient has 3+ pedal edema.  We will order echocardiogram.  IV Lasix as the blood pressure allows.  #3 hyponatremia: Hypovolemic hyponatremia.  Probably due to CHF and increased fluid volume.  Reassess after diuresis.  Osmolality 261.  Has remote history of lymphoma and this could be SIADH.  #4 hyperlipidemia: We will resume home regimen as soon as possible.   DVT prophylaxis: Heparin with Coumadin Code Status: Full code Family Communication: No family at bedside Disposition Plan: To be determined Consults called: Consult Dr. Nehemiah Massed in the morning Admission status: Inpatient  Severity of Illness: The appropriate patient status for this patient is INPATIENT. Inpatient status is judged to be reasonable and necessary in order to provide the required intensity of service to ensure the patient's safety. The patient's presenting symptoms, physical exam findings, and initial radiographic and laboratory data in the context of their chronic comorbidities is felt to place them at high risk for further clinical deterioration. Furthermore, it is not anticipated that the patient will be medically stable for discharge from the hospital within 2 midnights of admission. The following factors support the patient status of inpatient.   " The patient's presenting symptoms include shortness of breath and lower extremity swelling. " The worrisome physical exam findings include 3+ pedal edema. " The initial  radiographic and laboratory data are worrisome because of evidence of  CHF. " The chronic co-morbidities include chronic atrial fibrillation.   * I certify that at the point of admission it is my clinical judgment that the patient will require inpatient hospital care spanning beyond 2 midnights from the point of admission due to high intensity of service, high risk for further deterioration and high frequency of surveillance required.Barbette Merino MD [facility] Pager [phone]  If 7PM-7AM, please contact night-coverage [email or web address] Password [facility]  01/29/2021, 11:59 PM

## 2021-01-29 NOTE — ED Triage Notes (Addendum)
Scheduled for cardioversion tomorrow, which has been delayed due to thick.  Patient taking coumadin.  Arrives today with c/o SOB.  Also takes amiodarone and metoprolol and diltiazem.  This will be patient's third cardioversion in a month.

## 2021-01-29 NOTE — ED Provider Notes (Addendum)
[facility] Emergency Department Provider Note  ____________________________________________   Event Date/Time   First MD Initiated Contact with Patient 01/29/21 1820     (approximate)  I have reviewed the triage vital signs and the nursing notes.   HISTORY  Chief Complaint Shortness of Breath    HPI Carol [name] is a 80 y.o. female with history of A. fib, on Coumadin, status post ablation and with planned cardioversion tomorrow with Dr. Nehemiah Massed, here with shortness of breath and weakness.  The patient states that she has had increasing cough, shortness of breath, and weakness for the last several weeks.  It is worse over the last several days.  She states that she is scheduled for an ablation with Dr. Nehemiah Massed due to her symptomatic A. fib.  She has had recurrent A. fib despite previous ablation and is also being referred back to [facility] for possible ablation consideration.  She states that over the last week or 2, she is also developed significant bilateral lower extremity edema, cough, and shortness of breath, worse with exertion.  No specific alleviating factors.  No fevers.        Past Medical History:  Diagnosis Date  . A-fib ([place])   . Chronic sinusitis   . Fibrocystic breast disease   . History of pneumonia 1999  . Hyperlipidemia   . Irritable bowel syndrome   . Lichen planus   . 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 Mild tricuspid insufficiency Jan 2012   ECHO, [place]  . Moderate mitral insufficiency JAN 2012   ECHO, [place]    Patient Active Problem List   Diagnosis Date Noted  . Rapid atrial fibrillation ([place]) 01/29/2021  . CHF exacerbation ([place]) 01/29/2021  . Leg edema 01/29/2021  . Hyponatremia 01/29/2021  . Cough 10/31/2020  . Ill-defined cerebrovascular disease 10/12/2020  . Statin intolerance 01/08/2018  . Hepatic steatosis 07/23/2017  . Neck pain 03/28/2017  . Encounter for preventive health examination  11/30/2015  . Acquired thrombophilia ([place]) 05/16/2015  . Acute bronchitis with COPD ([place]) 05/15/2015  . Screening for colon cancer 11/07/2014  . S/P TAH-BSO (total abdominal hysterectomy and bilateral salpingo-oophorectomy) 11/07/2014  . History of breast cancer 06/06/2014  . Medicare annual wellness visit, subsequent 05/17/2014  . Lymphoma of lymph nodes of head, face, and/or neck ([place]) 08/15/2012  . Atrial fibrillation with rapid ventricular response ([place]) 08/15/2012  . Malignant neoplasm of orbit ([place]) 08/12/2012  . Lichen planus   . Irritable bowel syndrome   . Moderate mitral insufficiency   . Mild tricuspid insufficiency   . Hyperlipidemia     Past Surgical History:  Procedure Laterality Date  . ABDOMINAL HYSTERECTOMY     precancerous cervix,    . ABLATION OF DYSRHYTHMIC FOCUS  August 2013   [facility], Dr. Marcello Moores  . BREAST SURGERY     bilateral, benign biopsies  . CARDIOVERSION N/A 11/23/2020   Procedure: CARDIOVERSION;  Surgeon: Corey Skains, MD;  Location: [facility] ORS;  Service: Cardiovascular;  Laterality: N/A;  . CARDIOVERSION N/A 01/17/2021   Procedure: CARDIOVERSION;  Surgeon: Corey Skains, MD;  Location: [facility] ORS;  Service: Cardiovascular;  Laterality: N/A;  . MAXILLARY SINUS LIFT  1990's   [name]  . oophorectomy    . squamous cell removal Right    right leg calf area.    Prior to Admission medications   Medication Sig Start Date End Date Taking? Authorizing Provider  acetaminophen (TYLENOL) 500 MG tablet Take  500 mg by mouth every 6 (six) hours as needed for moderate pain or headache.   Yes [provider]  amiodarone (PACERONE) 200 MG tablet Take 1 tablet (200 mg total) by mouth 2 (two) times daily.   Yes [provider]  b complex vitamins capsule Take 1 capsule by mouth daily.   Yes [provider]  bisacodyl (DULCOLAX) 5 MG EC tablet Take 5 mg by mouth daily as needed for moderate constipation.   Yes [provider]   Cholecalciferol (VITAMIN D3) 125 MCG (5000 UT) TABS Take 5,000 Units by mouth daily.   Yes [provider]  diltiazem (CARDIZEM) 60 MG tablet Take 60 mg by mouth 3 (three) times daily.   Yes [provider]  diphenhydrAMINE (BENADRYL) 25 mg capsule Take 25 mg by mouth every 6 (six) hours as needed for allergies.   Yes [provider]  docusate sodium (COLACE) 100 MG capsule Take 100 mg by mouth daily as needed for mild constipation.   Yes [provider]  furosemide (LASIX) 20 MG tablet TAKE 1 TABLET EVERY DAY Patient taking differently: Take 20 mg by mouth daily. 08/28/20  Yes Crecencio Mc, MD  Glucosamine-Chondroit-Vit C-Mn (GLUCOSAMINE 1500 COMPLEX PO) Take 1,500 mg by mouth daily.   Yes [provider]  Glycerin-Polysorbate 80 (REFRESH DRY EYE THERAPY OP) Place 1 drop into both eyes 2 (two) times daily.   Yes [provider]  magnesium oxide (MAG-OX) 400 MG tablet Take 400 mg by mouth in the morning and at bedtime.   Yes [provider]  metoprolol succinate (TOPROL-XL) 25 MG 24 hr tablet Take 50 mg by mouth in the morning and at bedtime. 11/17/20  Yes [provider]  Multiple Vitamin (MULTIVITAMIN WITH MINERALS) TABS tablet Take 0.5 tablets by mouth 2 (two) times daily.   Yes [provider]  Omega-3 Fatty Acids (FISH OIL) 1000 MG CAPS Take 2,000 mg by mouth in the morning, at noon, and at bedtime.   Yes [provider]  omeprazole (PRILOSEC) 20 MG capsule TAKE 1 CAPSULE EVERY DAY Patient taking differently: Take 20 mg by mouth daily before breakfast. 08/28/20  Yes Crecencio Mc, MD  vitamin E 1000 UNIT capsule Take 1,000 Units by mouth 2 (two) times daily.   Yes [provider]  warfarin (COUMADIN) 3 MG tablet Take 1.5-3 mg by mouth See admin instructions. Take 1.5 mg Fridays and Sundays and 3 mg other days of the week. (Takes at night)   Yes [provider]  albuterol (PROVENTIL  HFA;VENTOLIN HFA) 108 (90 BASE) MCG/ACT inhaler Inhale 2 puffs into the lungs every 6 (six) hours as needed. Patient not taking: No sig reported 05/15/15   Crecencio Mc, MD  benzonatate (TESSALON) 200 MG capsule Take 1 capsule (200 mg total) by mouth 3 (three) times daily as needed for cough. Patient not taking: No sig reported 10/31/20   Crecencio Mc, MD  doxycycline (VIBRA-TABS) 100 MG tablet Take 1 tablet (100 mg total) by mouth 2 (two) times daily. Patient not taking: Reported on 01/29/2021 10/31/20   Crecencio Mc, MD  levalbuterol [facility] HFA) 45 MCG/ACT inhaler Inhale 1-2 puffs into the lungs every 4 (four) hours as needed for wheezing. 08/13/12 08/13/12  Crecencio Mc, MD    Allergies Prednisone, Pulmicort [budesonide], Clonidine, Lotemax [loteprednol etabonate], Morphine and related, Trovan [alatrofloxacin mesylate], Zelnorm [tegaserod maleate], and Erythromycin  Family History  Problem Relation Age of Onset  . Cancer  Mother        Breast  . Hyperlipidemia Father   . Heart disease Father   . Hypertension Father   . Kidney disease Father   . Cancer Maternal Grandfather        colon CA  . Diverticulitis Sister     Social History Social History   Tobacco Use  . Smoking status: Never Smoker  . Smokeless tobacco: Never Used  Substance Use Topics  . Alcohol use: Yes    Alcohol/week: 4.0 standard drinks    Types: 4 Glasses of wine per week    Comment: Occ.  . Drug use: No    Review of Systems  Review of Systems  Constitutional: Positive for fatigue. Negative for fever.  HENT: Negative for congestion and sore throat.   Eyes: Negative for visual disturbance.  Respiratory: Positive for cough and shortness of breath.   Cardiovascular: Positive for leg swelling. Negative for chest pain.  Gastrointestinal: Negative for abdominal pain, diarrhea, nausea and vomiting.  Genitourinary: Negative for flank pain.  Musculoskeletal: Negative for back pain and neck pain.   Skin: Negative for rash and wound.  Neurological: Positive for weakness.  All other systems reviewed and are negative.    ____________________________________________  PHYSICAL EXAM:      VITAL SIGNS: ED Triage Vitals  Enc Vitals Group     BP 01/29/21 1811 111/89     Pulse Rate 01/29/21 1811 78     Resp 01/29/21 1811 (!) 24     Temp 01/29/21 1811 97.6 F (36.4 C)     Temp Source 01/29/21 1811 Oral     SpO2 01/29/21 1811 100 %     Weight 01/29/21 1808 170 lb (77.1 kg)     Height 01/29/21 1808 5\' 6"  (1.676 m)     Head Circumference --      Peak Flow --      Pain Score 01/29/21 1808 0     Pain Loc --      Pain Edu? --      Excl. in [place]? --      Physical Exam Vitals and nursing note reviewed.  Constitutional:      General: She is not in acute distress.    Appearance: She is well-developed.  HENT:     Head: Normocephalic and atraumatic.  Eyes:     Conjunctiva/sclera: Conjunctivae normal.  Cardiovascular:     Rate and Rhythm: Tachycardia present. Rhythm irregular.     Heart sounds: Normal heart sounds. No murmur heard. No friction rub.  Pulmonary:     Effort: Pulmonary effort is normal. No respiratory distress.     Breath sounds: Examination of the right-lower field reveals rales. Examination of the left-lower field reveals rales. Rales present. No wheezing.  Abdominal:     General: There is no distension.     Palpations: Abdomen is soft.     Tenderness: There is no abdominal tenderness.  Musculoskeletal:     Cervical back: Neck supple.     Right lower leg: Edema present.     Left lower leg: Edema present.  Skin:    General: Skin is warm.     Capillary Refill: Capillary refill takes less than 2 seconds.  Neurological:     Mental Status: She is alert and oriented to person, place, and time.     Motor: No abnormal muscle tone.       ____________________________________________   LABS (all labs ordered are listed, but only abnormal results are  displayed)  Labs Reviewed  BASIC METABOLIC PANEL - Abnormal; Notable for the following components:      Result Value   Sodium 120 (*)    Chloride 86 (*)    CO2 20 (*)    Glucose, Bld 155 (*)    BUN 24 (*)    All other components within normal limits  CBC - Abnormal; Notable for the following components:   WBC 11.7 (*)    All other components within normal limits  PROTIME-INR - Abnormal; Notable for the following components:   Prothrombin Time 19.5 (*)    INR 1.7 (*)    All other components within normal limits  BRAIN NATRIURETIC PEPTIDE - Abnormal; Notable for the following components:   B Natriuretic Peptide 3,453.0 (*)    All other components within normal limits  HEPATIC FUNCTION PANEL - Abnormal; Notable for the following components:   AST 83 (*)    ALT 100 (*)    Total Bilirubin 1.4 (*)    Bilirubin, Direct 0.3 (*)    Indirect Bilirubin 1.1 (*)    All other components within normal limits  OSMOLALITY - Abnormal; Notable for the following components:   Osmolality 261 (*)    All other components within normal limits  TROPONIN I (HIGH SENSITIVITY) - Abnormal; Notable for the following components:   Troponin I (High Sensitivity) 18 (*)    All other components within normal limits  RESP PANEL BY RT-PCR (FLU A&B, COVID) ARPGX2  APTT  PROCALCITONIN  OSMOLALITY, URINE  URINALYSIS, COMPLETE (UACMP) WITH MICROSCOPIC  CREATININE, URINE, RANDOM  SODIUM, URINE, RANDOM  HEPARIN LEVEL (UNFRACTIONATED)  CBC  PROTIME-INR  TROPONIN I (HIGH SENSITIVITY)    ____________________________________________  EKG: Atrial fibrillation, ventricular rate 86.  QRS 94, QTc 473.  No acute ST elevations or depressions.  No EKG evidence of acute ischemia or infarct. ________________________________________  RADIOLOGY All imaging, including plain films, CT scans, and ultrasounds, independently reviewed by me, and interpretations confirmed via formal radiology reads.  ED MD interpretation:      Official radiology report(s): DG Chest 2 View  Result Date: 01/29/2021 CLINICAL DATA:  Shortness of breath EXAM: CHEST - 2 VIEW COMPARISON:  10/31/2020 FINDINGS: Right-sided central venous port tip over the distal SVC. Cardiomegaly with small bilateral pleural effusions and basilar airspace disease. No pneumothorax. IMPRESSION: Cardiomegaly with small bilateral pleural effusions and basilar airspace disease, atelectasis versus pneumonia. Electronically Signed   By: Donavan Foil M.D.   On: 01/29/2021 19:03    ____________________________________________  PROCEDURES   Procedure(s) performed (including Critical Care):  .Critical Care Performed by: Duffy Bruce, MD Authorized by: Duffy Bruce, MD   Critical care provider statement:    Critical care time (minutes):  35   Critical care time was exclusive of:  Separately billable procedures and treating other patients and teaching time   Critical care was necessary to treat or prevent imminent or life-threatening deterioration of the following conditions:  Circulatory failure, cardiac failure and respiratory failure   Critical care was time spent personally by me on the following activities:  Development of treatment plan with patient or surrogate, discussions with consultants, evaluation of patient's response to treatment, examination of patient, obtaining history from patient or surrogate, ordering and performing treatments and interventions, ordering and review of laboratory studies, ordering and review of radiographic studies, pulse oximetry, re-evaluation of patient's condition and review of old charts   I assumed direction of critical care for this patient from another provider in my specialty:  no      ____________________________________________  INITIAL IMPRESSION / MDM / ASSESSMENT AND PLAN / ED COURSE  As part of my medical decision making, I reviewed the following data within the [place]  notes reviewed and incorporated, Old chart reviewed, Notes from prior ED visits, and [place] Controlled Substance Database       *Carol [name] was evaluated in Emergency Department on 01/30/2021 for the symptoms described in the history of present illness. She was evaluated in the context of the global COVID-19 pandemic, which necessitated consideration that the patient might be at risk for infection with the SARS-CoV-2 virus that causes COVID-19. Institutional protocols and algorithms that pertain to the evaluation of patients at risk for COVID-19 are in a state of rapid change based on information released by regulatory bodies including the CDC and federal and state organizations. These policies and algorithms were followed during the patient's care in the ED.  Some ED evaluations and interventions may be delayed as a result of limited staffing during the pandemic.*     Medical Decision Making:  80 yo F here with SOB, weakness. On exam, pt hypervolemic with pitting edema, bl rales and tachycardia c/w AFib. Lab work shows marked hyponatremia, hypervolemia with elevated BNP. Renal function is wnl. Mild leukocytosis noted, CXR read as effusions w/ atelectasis. She has had a mild cough but suspect this is mostly fluid as opposed to infection. Will start diuresis, admit. Urine lytes added on. Pt sees Dr. Nehemiah Massed and was scheduled for DCCV tomorrow. She is subtherapeutic on her iNR here, will start heparin while stabilizing medically.   ____________________________________________  FINAL CLINICAL IMPRESSION(S) / ED DIAGNOSES  Final diagnoses:  Acute on chronic systolic congestive heart failure (HCC)  Paroxysmal atrial fibrillation (HCC)  Hyponatremia     MEDICATIONS GIVEN DURING THIS VISIT:  Medications  heparin ADULT infusion 100 units/mL (25000 units/257mL) (1,100 Units/hr Intravenous Rate/Dose Verify 01/29/21 2323)  furosemide (LASIX) injection 20 mg (has no administration in time  range)  furosemide (LASIX) injection 20 mg (20 mg Intravenous Given 01/29/21 2136)     ED Discharge Orders    None       Note:  This document was prepared using Dragon voice recognition software and may include unintentional dictation errors.   Duffy Bruce, MD 01/30/21 Aida Raider    Duffy Bruce, MD 02/14/21 [phone]

## 2021-01-29 NOTE — H&P (Incomplete)
History and Physical   Carol [name] TTS:[id number] DOB: [birth year] DOA: 01/29/2021  Referring MD/NP/PA: Dr. Ellender Hose  PCP: Crecencio Mc, MD   Outpatient Specialists: Dr. Nehemiah Massed  Patient coming from: Home  Chief Complaint: Shortness of breath and palpitations  HPI: Carol [name] is a 80 y.o. female with medical history significant of atrial fibrillation, on chronic Coumadin therapy, irritable bowel syndrome, history of lymphoma, fibrocystic breast disease who is scheduled for ablation of her A. fib tomorrow.  She has had also previous follow-up with [facility].  Patient came in due to worsening shortness of breath and worsening lower extremity edema and overall feeling bad.  She was found to be in rapid atrial fibrillation.  She also has significant weakness and appears to have worsening congestive heart failure.  She is being admitted to the hospital for further work-up.  Patient complained of some PND some on orthopnea.  No fever or chills she has some cough..  ED Course: Temperature 97.6 blood pressure 116/85 pulse 70 respirate 33 oxygen sats 95% on room air.  White count is 11.7 sodium 120 potassium 4.3, chloride 86 CO2 20 BUN 24 creatinine 0.93 calcium 9.3.  BNP is 3400.  Troponin XVIII osmolality 261.  White count 11.7.  PT 19.5 INR 1.7.  Respiratory panel is negative.  Chest x-ray shows cardiomegaly with small bilateral pleural effusions and basilar airspace disease probably atelectasis.  Review of Systems: As per HPI otherwise 10 point review of systems negative.    Past Medical History:  Diagnosis Date  . A-fib ([facility])   . Chronic sinusitis   . Fibrocystic breast disease   . History of pneumonia 1999  . Hyperlipidemia   . Irritable bowel syndrome   . Lichen planus   . 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 Mild tricuspid insufficiency Jan 2012   ECHO, [place]  . Moderate mitral insufficiency JAN 2012   ECHO, [place]    Past Surgical  History:  Procedure Laterality Date  . ABDOMINAL HYSTERECTOMY     precancerous cervix,    . ABLATION OF DYSRHYTHMIC FOCUS  August 2013   [facility], Dr. Marcello Moores  . BREAST SURGERY     bilateral, benign biopsies  . CARDIOVERSION N/A 11/23/2020   Procedure: CARDIOVERSION;  Surgeon: Corey Skains, MD;  Location: [facility] ORS;  Service: Cardiovascular;  Laterality: N/A;  . CARDIOVERSION N/A 01/17/2021   Procedure: CARDIOVERSION;  Surgeon: Corey Skains, MD;  Location: [facility] ORS;  Service: Cardiovascular;  Laterality: N/A;  . MAXILLARY SINUS LIFT  1990's   [name]  . oophorectomy    . squamous cell removal Right    right leg calf area.     reports that she has never smoked. She has never used smokeless tobacco. She reports current alcohol use of about 4.0 standard drinks of alcohol per week. She reports that she does not use drugs.  Allergies  Allergen Reactions  . Prednisone Palpitations    A. fib  . Pulmicort [Budesonide] Shortness Of Breath, Itching, Swelling and Other (See Comments)    Felt as if things were crawly on her  . Clonidine Other (See Comments)    Pt felt like things were crawling on her arms.  Amador Cunas [Loteprednol Etabonate]     Broke out around the eye  . Morphine And Related Itching and Other (See Comments)    "creepy crawly feeling"  . Trovan [Alatrofloxacin Mesylate] Other (See Comments)    "effected the whole system"  .  Zelnorm [Tegaserod Maleate] Itching    Felt as if things were crawly on her   . Erythromycin Rash    Family History  Problem Relation Age of Onset  . Cancer Mother        Breast  . Hyperlipidemia Father   . Heart disease Father   . Hypertension Father   . Kidney disease Father   . Cancer Maternal Grandfather        colon CA  . Diverticulitis Sister      Prior to Admission medications   Medication Sig Start Date End Date Taking? Authorizing Provider  acetaminophen (TYLENOL) 500 MG tablet Take 500 mg by mouth every 6 (six) hours  as needed for moderate pain or headache.   Yes [provider]  amiodarone (PACERONE) 200 MG tablet Take 1 tablet (200 mg total) by mouth 2 (two) times daily.   Yes [provider]  b complex vitamins capsule Take 1 capsule by mouth daily.   Yes [provider]  bisacodyl (DULCOLAX) 5 MG EC tablet Take 5 mg by mouth daily as needed for moderate constipation.   Yes [provider]  Cholecalciferol (VITAMIN D3) 125 MCG (5000 UT) TABS Take 5,000 Units by mouth daily.   Yes [provider]  diltiazem (CARDIZEM) 60 MG tablet Take 60 mg by mouth 3 (three) times daily.   Yes [provider]  diphenhydrAMINE (BENADRYL) 25 mg capsule Take 25 mg by mouth every 6 (six) hours as needed for allergies.   Yes [provider]  docusate sodium (COLACE) 100 MG capsule Take 100 mg by mouth daily as needed for mild constipation.   Yes [provider]  furosemide (LASIX) 20 MG tablet TAKE 1 TABLET EVERY DAY Patient taking differently: Take 20 mg by mouth daily. 08/28/20  Yes Crecencio Mc, MD  Glucosamine-Chondroit-Vit C-Mn (GLUCOSAMINE 1500 COMPLEX PO) Take 1,500 mg by mouth daily.   Yes [provider]  Glycerin-Polysorbate 80 (REFRESH DRY EYE THERAPY OP) Place 1 drop into both eyes 2 (two) times daily.   Yes [provider]  magnesium oxide (MAG-OX) 400 MG tablet Take 400 mg by mouth in the morning and at bedtime.   Yes [provider]  metoprolol succinate (TOPROL-XL) 25 MG 24 hr tablet Take 50 mg by mouth in the morning and at bedtime. 11/17/20  Yes [provider]  Multiple Vitamin (MULTIVITAMIN WITH MINERALS) TABS tablet Take 0.5 tablets by mouth 2 (two) times daily.   Yes [provider]  Omega-3 Fatty Acids (FISH OIL) 1000 MG CAPS Take 2,000 mg by mouth in the morning, at noon, and at bedtime.   Yes [provider]  omeprazole (PRILOSEC) 20 MG capsule TAKE 1 CAPSULE EVERY DAY Patient  taking differently: Take 20 mg by mouth daily before breakfast. 08/28/20  Yes Crecencio Mc, MD  vitamin E 1000 UNIT capsule Take 1,000 Units by mouth 2 (two) times daily.   Yes [provider]  warfarin (COUMADIN) 3 MG tablet Take 1.5-3 mg by mouth See admin instructions. Take 1.5 mg Fridays and Sundays and 3 mg other days of the week. (Takes at night)   Yes [provider]  albuterol (PROVENTIL HFA;VENTOLIN HFA) 108 (90 BASE) MCG/ACT inhaler Inhale 2 puffs into the lungs every 6 (six) hours as needed. Patient not taking: No sig reported 05/15/15   Crecencio Mc, MD  benzonatate (TESSALON) 200 MG capsule Take 1 capsule (200 mg total) by mouth 3 (three) times daily  as needed for cough. Patient not taking: No sig reported 10/31/20   Crecencio Mc, MD  doxycycline (VIBRA-TABS) 100 MG tablet Take 1 tablet (100 mg total) by mouth 2 (two) times daily. Patient not taking: Reported on 01/29/2021 10/31/20   Crecencio Mc, MD  levalbuterol [facility] HFA) 45 MCG/ACT inhaler Inhale 1-2 puffs into the lungs every 4 (four) hours as needed for wheezing. 08/13/12 08/13/12  Crecencio Mc, MD    Physical Exam: Vitals:   01/29/21 1808 01/29/21 1811 01/29/21 1946 01/29/21 2100  BP:  111/89 116/85 (!) 112/92  Pulse:  78 77 (!) 35  Resp:  (!) 24 (!) 29 (!) 33  Temp:  97.6 F (36.4 C)    TempSrc:  Oral    SpO2:  100% 98% 95%  Weight: 77.1 kg     Height: 5\' 6"  (1.676 m)         Constitutional: NAD, calm, comfortable Vitals:   01/29/21 1808 01/29/21 1811 01/29/21 1946 01/29/21 2100  BP:  111/89 116/85 (!) 112/92  Pulse:  78 77 (!) 35  Resp:  (!) 24 (!) 29 (!) 33  Temp:  97.6 F (36.4 C)    TempSrc:  Oral    SpO2:  100% 98% 95%  Weight: 77.1 kg     Height: 5\' 6"  (1.676 m)      Eyes: PERRL, lids and conjunctivae normal ENMT: Mucous membranes are moist. Posterior pharynx clear of any exudate or lesions.Normal dentition.  Neck: normal, supple, no masses, no  thyromegaly Respiratory: clear to auscultation bilaterally, no wheezing, no crackles. Normal respiratory effort. No accessory muscle use.  Cardiovascular: Regular rate and rhythm, no murmurs / rubs / gallops. No extremity edema. 2+ pedal pulses. No carotid bruits.  Abdomen: no tenderness, no masses palpated. No hepatosplenomegaly. Bowel sounds positive.  Musculoskeletal: no clubbing / cyanosis. No joint deformity upper and lower extremities. Good ROM, no contractures. Normal muscle tone.  Skin: no rashes, lesions, ulcers. No induration Neurologic: CN 2-12 grossly intact. Sensation intact, DTR normal. Strength 5/5 in all 4.  Psychiatric: Normal judgment and insight. Alert and oriented x 3. Normal mood.     Labs on Admission: I have personally reviewed following labs and imaging studies  CBC: Recent Labs  Lab 01/29/21 1819  WBC 11.7*  HGB 14.1  HCT 40.6  MCV 90.6  PLT 355   Basic Metabolic Panel: Recent Labs  Lab 01/29/21 1819  NA 120*  K 4.3  CL 86*  CO2 20*  GLUCOSE 155*  BUN 24*  CREATININE 0.93  CALCIUM 9.3   GFR: Estimated Creatinine Clearance: 50.6 mL/min (by C-G formula based on SCr of 0.93 mg/dL). Liver Function Tests: Recent Labs  Lab 01/29/21 1819  AST 83*  ALT 100*  ALKPHOS 77  BILITOT 1.4*  PROT 6.8  ALBUMIN 3.7   No results for input(s): LIPASE, AMYLASE in the last 168 hours. No results for input(s): AMMONIA in the last 168 hours. Coagulation Profile: Recent Labs  Lab 01/29/21 1819  INR 1.7*   Cardiac Enzymes: No results for input(s): CKTOTAL, CKMB, CKMBINDEX, TROPONINI in the last 168 hours. BNP (last 3 results) No results for input(s): PROBNP in the last 8760 hours. HbA1C: No results for input(s): HGBA1C in the last 72 hours. CBG: No results for input(s): GLUCAP in the last 168 hours. Lipid Profile: No results for input(s): CHOL, HDL, LDLCALC, TRIG, CHOLHDL, LDLDIRECT in the last 72 hours. Thyroid Function Tests: No results for  input(s): TSH, T4TOTAL, FREET4,  T3FREE, THYROIDAB in the last 72 hours. Anemia Panel: No results for input(s): VITAMINB12, FOLATE, FERRITIN, TIBC, IRON, RETICCTPCT in the last 72 hours. Urine analysis:    Component Value Date/Time   COLORURINE YELLOW (A) 09/29/2017 2320   APPEARANCEUR HAZY (A) 09/29/2017 2320   LABSPEC 1.004 (L) 09/29/2017 2320   PHURINE 7.0 09/29/2017 2320   GLUCOSEU NEGATIVE 09/29/2017 2320   HGBUR NEGATIVE 09/29/2017 2320   BILIRUBINUR NEGATIVE 09/29/2017 2320   KETONESUR NEGATIVE 09/29/2017 2320   PROTEINUR NEGATIVE 09/29/2017 2320   NITRITE NEGATIVE 09/29/2017 2320   LEUKOCYTESUR NEGATIVE 09/29/2017 2320   Sepsis Labs: @LABRCNTIP (procalcitonin:4,lacticidven:4) ) Recent Results (from the past 240 hour(s))  SARS CORONAVIRUS 2 (TAT 6-24 HRS) Nasopharyngeal Nasopharyngeal Swab     Status: None   Collection Time: 01/26/21 11:53 AM   Specimen: Nasopharyngeal Swab  Result Value Ref Range Status   SARS Coronavirus 2 NEGATIVE NEGATIVE Final    Comment: (NOTE) SARS-CoV-2 target nucleic acids are NOT DETECTED.  The SARS-CoV-2 RNA is generally detectable in upper and lower respiratory specimens during the acute phase of infection. Negative results do not preclude SARS-CoV-2 infection, do not rule out co-infections with other pathogens, and should not be used as the sole basis for treatment or other patient management decisions. Negative results must be combined with clinical observations, patient history, and epidemiological information. The expected result is Negative.  Fact Sheet for Patients: SugarRoll.be  Fact Sheet for Healthcare Providers: https://www.woods-mathews.com/  This test is not yet approved or cleared by the [place] FDA and  has been authorized for detection and/or diagnosis of SARS-CoV-2 by FDA under an Emergency Use Authorization (EUA). This EUA will remain  in effect (meaning this test can be  used) for the duration of the COVID-19 declaration under Se ction 564(b)(1) of the Act, 21 U.S.C. section 360bbb-3(b)(1), unless the authorization is terminated or revoked sooner.  Performed at [facility], [place] [address], [place], [place] [address]   Resp Panel by RT-PCR (Flu A&B, Covid) Nasopharyngeal Swab     Status: None   Collection Time: 01/29/21  7:46 PM   Specimen: Nasopharyngeal Swab; Nasopharyngeal(NP) swabs in vial transport medium  Result Value Ref Range Status   SARS Coronavirus 2 by RT PCR NEGATIVE NEGATIVE Final    Comment: (NOTE) SARS-CoV-2 target nucleic acids are NOT DETECTED.  The SARS-CoV-2 RNA is generally detectable in upper respiratory specimens during the acute phase of infection. The lowest concentration of SARS-CoV-2 viral copies this assay can detect is 138 copies/mL. A negative result does not preclude SARS-Cov-2 infection and should not be used as the sole basis for treatment or other patient management decisions. A negative result may occur with  improper specimen collection/handling, submission of specimen other than nasopharyngeal swab, presence of viral mutation(s) within the areas targeted by this assay, and inadequate number of viral copies(<138 copies/mL). A negative result must be combined with clinical observations, patient history, and epidemiological information. The expected result is Negative.  Fact Sheet for Patients:  EntrepreneurPulse.com.au  Fact Sheet for Healthcare Providers:  IncredibleEmployment.be  This test is no t yet approved or cleared by the [place] FDA and  has been authorized for detection and/or diagnosis of SARS-CoV-2 by FDA under an Emergency Use Authorization (EUA). This EUA will remain  in effect (meaning this test can be used) for the duration of the COVID-19 declaration under Section 564(b)(1) of the Act, 21 U.S.C.section 360bbb-3(b)(1), unless the authorization is  terminated  or revoked sooner.  Influenza A by PCR NEGATIVE NEGATIVE Final   Influenza B by PCR NEGATIVE NEGATIVE Final    Comment: (NOTE) The Xpert Xpress SARS-CoV-2/FLU/RSV plus assay is intended as an aid in the diagnosis of influenza from Nasopharyngeal swab specimens and should not be used as a sole basis for treatment. Nasal washings and aspirates are unacceptable for Xpert Xpress SARS-CoV-2/FLU/RSV testing.  Fact Sheet for Patients: EntrepreneurPulse.com.au  Fact Sheet for Healthcare Providers: IncredibleEmployment.be  This test is not yet approved or cleared by the [place] FDA and has been authorized for detection and/or diagnosis of SARS-CoV-2 by FDA under an Emergency Use Authorization (EUA). This EUA will remain in effect (meaning this test can be used) for the duration of the COVID-19 declaration under Section 564(b)(1) of the Act, 21 U.S.C. section 360bbb-3(b)(1), unless the authorization is terminated or revoked.  Performed at [facility], [address] [place], [place] [address]      Radiological Exams on Admission: DG Chest 2 View  Result Date: 01/29/2021 CLINICAL DATA:  Shortness of breath EXAM: CHEST - 2 VIEW COMPARISON:  10/31/2020 FINDINGS: Right-sided central venous port tip over the distal SVC. Cardiomegaly with small bilateral pleural effusions and basilar airspace disease. No pneumothorax. IMPRESSION: Cardiomegaly with small bilateral pleural effusions and basilar airspace disease, atelectasis versus pneumonia. Electronically Signed   By: Donavan Foil M.D.   On: 01/29/2021 19:03    EKG: Independently reviewed. ***  Assessment/Plan Principal Problem:   Rapid atrial fibrillation (HCC) Active Problems:   Hyperlipidemia   CHF exacerbation (HCC)   Leg edema   Hyponatremia     ***   DVT prophylaxis: ***  Code Status: ***  Family Communication: ***  Disposition Plan: ***  Consults  called: ***  Admission status: ***   Severity of Illness: {Observation/Inpatient:21159}   GARBA,LAWAL MD [facility] Pager [phone]  If 7PM-7AM, please contact night-coverage [email or web address] Password [facility]  01/29/2021, 11:59 PM

## 2021-01-30 ENCOUNTER — Encounter: Payer: Self-pay | Admitting: Registered Nurse

## 2021-01-30 ENCOUNTER — Encounter
Admission: EM | Disposition: A | Discharge status: Other Acute Inpatient Hospital | Payer: Self-pay | Source: Home / Self Care | Attending: Internal Medicine

## 2021-01-30 ENCOUNTER — Encounter: Payer: Self-pay | Admitting: Internal Medicine

## 2021-01-30 ENCOUNTER — Other Ambulatory Visit: Payer: Self-pay

## 2021-01-30 ENCOUNTER — Ambulatory Visit: Admission: RE | Admit: 2021-01-30 | Payer: Medicare Other | Source: Home / Self Care | Admitting: Internal Medicine

## 2021-01-30 DIAGNOSIS — I4891 Unspecified atrial fibrillation: Secondary | ICD-10-CM | POA: Diagnosis not present

## 2021-01-30 LAB — CBC
HCT: 33 % — ABNORMAL LOW (ref 36.0–46.0)
HCT: 37.9 % (ref 36.0–46.0)
Hemoglobin: 11.6 g/dL — ABNORMAL LOW (ref 12.0–15.0)
Hemoglobin: 13.1 g/dL (ref 12.0–15.0)
MCH: 31.8 pg (ref 26.0–34.0)
MCH: 31.3 pg (ref 26.0–34.0)
MCHC: 35.2 g/dL (ref 30.0–36.0)
MCHC: 34.6 g/dL (ref 30.0–36.0)
MCV: 90.4 fL (ref 80.0–100.0)
MCV: 90.7 fL (ref 80.0–100.0)
Platelets: 223 10*3/uL (ref 150–400)
Platelets: 274 10*3/uL (ref 150–400)
RBC: 3.65 MIL/uL — ABNORMAL LOW (ref 3.87–5.11)
RBC: 4.18 MIL/uL (ref 3.87–5.11)
RDW: 13.8 % (ref 11.5–15.5)
RDW: 13.9 % (ref 11.5–15.5)
WBC: 8.8 10*3/uL (ref 4.0–10.5)
WBC: 10.2 10*3/uL (ref 4.0–10.5)
nRBC: 0 % (ref 0.0–0.2)
nRBC: 0 % (ref 0.0–0.2)

## 2021-01-30 LAB — URINALYSIS, COMPLETE (UACMP) WITH MICROSCOPIC
Bacteria, UA: NONE SEEN
Bilirubin Urine: NEGATIVE
Glucose, UA: NEGATIVE mg/dL
Hgb urine dipstick: NEGATIVE
Ketones, ur: NEGATIVE mg/dL
Leukocytes,Ua: NEGATIVE
Nitrite: NEGATIVE
Protein, ur: NEGATIVE mg/dL
Specific Gravity, Urine: 1.005 (ref 1.005–1.030)
pH: 6 (ref 5.0–8.0)

## 2021-01-30 LAB — BASIC METABOLIC PANEL
Anion gap: 11 (ref 5–15)
BUN: 26 mg/dL — ABNORMAL HIGH (ref 8–23)
CO2: 24 mmol/L (ref 22–32)
Calcium: 8.5 mg/dL — ABNORMAL LOW (ref 8.9–10.3)
Chloride: 93 mmol/L — ABNORMAL LOW (ref 98–111)
Creatinine, Ser: 0.95 mg/dL (ref 0.44–1.00)
GFR, Estimated: >60 mL/min (ref >60)
Glucose, Bld: 96 mg/dL (ref 70–99)
Potassium: 3.8 mmol/L (ref 3.5–5.1)
Sodium: 128 mmol/L — ABNORMAL LOW (ref 135–145)

## 2021-01-30 LAB — PROTIME-INR
INR: 1.9 — ABNORMAL HIGH (ref 0.8–1.2)
Prothrombin Time: 21.3 seconds — ABNORMAL HIGH (ref 11.4–15.2)

## 2021-01-30 LAB — SODIUM, URINE, RANDOM: Sodium, Ur: <10 mmol/L

## 2021-01-30 LAB — HEPARIN LEVEL (UNFRACTIONATED)
Heparin Unfractionated: 0.58 IU/mL (ref 0.30–0.70)
Heparin Unfractionated: 0.74 IU/mL — ABNORMAL HIGH (ref 0.30–0.70)

## 2021-01-30 LAB — OSMOLALITY, URINE: Osmolality, Ur: 212 mOsm/kg — ABNORMAL LOW (ref 300–900)

## 2021-01-30 LAB — CREATININE, URINE, RANDOM: Creatinine, Urine: 27 mg/dL

## 2021-01-30 SURGERY — CARDIOVERSION
Anesthesia: General

## 2021-01-30 MED ORDER — ONDANSETRON HCL 4 MG/2ML IJ SOLN: 4.0000 mg | Freq: Four times a day (QID) | INTRAMUSCULAR | Status: DC | PRN

## 2021-01-30 MED ORDER — AMIODARONE HCL 200 MG PO TABS
300.0000 mg | ORAL_TABLET | Freq: Two times a day (BID) | ORAL | Status: DC
  Administered 2021-01-30 – 2021-02-01 (×6): 300 mg via ORAL
  Filled 2021-01-30 (×3): qty 2
  Filled 2021-01-30: qty 1
  Filled 2021-01-30 (×2): qty 2
  Filled 2021-01-30: qty 1

## 2021-01-30 MED ORDER — WARFARIN - PHARMACIST DOSING INPATIENT
Freq: Every day | Status: DC
  Filled 2021-01-30: qty 1

## 2021-01-30 MED ORDER — SODIUM CHLORIDE 0.9 % IV SOLN: INTRAVENOUS | Status: DC

## 2021-01-30 MED ORDER — ORAL CARE MOUTH RINSE
15.0000 mL | Freq: Two times a day (BID) | OROMUCOSAL | Status: DC
  Administered 2021-01-30 – 2021-02-01 (×6): 15 mL via OROMUCOSAL
  Filled 2021-01-30 (×2): qty 15

## 2021-01-30 MED ORDER — SODIUM CHLORIDE 0.9% FLUSH
3.0000 mL | Freq: Two times a day (BID) | INTRAVENOUS | Status: DC
  Administered 2021-01-30 – 2021-02-01 (×5): 3 mL via INTRAVENOUS

## 2021-01-30 MED ORDER — METOPROLOL SUCCINATE ER 50 MG PO TB24: 25.0000 mg | ORAL_TABLET | Freq: Every day | ORAL | Status: DC

## 2021-01-30 MED ORDER — ACETAMINOPHEN 325 MG PO TABS
650.0000 mg | ORAL_TABLET | ORAL | Status: DC | PRN
  Administered 2021-01-30: 650 mg via ORAL
  Filled 2021-01-30: qty 2

## 2021-01-30 MED ORDER — WARFARIN SODIUM 1 MG PO TABS
1.5000 mg | ORAL_TABLET | Freq: Once | ORAL | Status: AC
  Administered 2021-01-30: 1.5 mg via ORAL
  Filled 2021-01-30: qty 1

## 2021-01-30 MED ORDER — FUROSEMIDE 10 MG/ML IJ SOLN
20.0000 mg | Freq: Two times a day (BID) | INTRAMUSCULAR | Status: DC
  Administered 2021-01-30 – 2021-02-01 (×6): 20 mg via INTRAVENOUS
  Filled 2021-01-30 (×4): qty 2
  Filled 2021-01-30: qty 4
  Filled 2021-01-30: qty 2

## 2021-01-30 MED ORDER — CHLORHEXIDINE GLUCONATE CLOTH 2 % EX PADS
6.0000 | MEDICATED_PAD | Freq: Every day | CUTANEOUS | Status: DC
  Administered 2021-01-30 – 2021-02-01 (×4): 6 via TOPICAL

## 2021-01-30 NOTE — Consult Note (Signed)
[facility] Consultation Note  Patient ID: Carol [name], MRN: [id number], DOB/AGE: [birth year] 80 y.o. Admit date: 01/29/2021   Date of Consult: 01/30/2021 Primary Physician: Crecencio Mc, MD Primary Cardiologist: Nehemiah Massed  Chief Complaint:  Chief Complaint  Patient presents with  . Shortness of Breath   Reason for Consult: Atrial fibrillation  HPI: 80 y.o. female with known hypertension hyperlipidemia with paroxysmal nonvalvular atrial fibrillation who has had significant episodes of persistent atrial fibrillation with shortness of breath and rapid ventricular rate.  The patient was placed on amiodarone for which she is tolerated relatively well other than some mild nausea.  With this atrial fibrillation she has had episodes of worsening shortness of breath and mild pleural effusions.  EKG has shown that she has atrial fibrillation with rapid ventricular rate and nonspecific ST changes.  Due to her INR being 1.8 the patient was placed on heparin for further risk reduction and stroke with atrial fibrillation.  We have discussed at length the possibility of further intervention including a transesophageal echocardiogram with electrical cardioversion to normal sinus rhythm with no thrombus is found.  She understands risk and benefits of this procedure  Past Medical History:  Diagnosis Date  . A-fib ([place])   . Chronic sinusitis   . Fibrocystic breast disease   . History of pneumonia 1999  . Hyperlipidemia   . Irritable bowel syndrome   . Lichen planus   . 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 Mild tricuspid insufficiency Jan 2012   ECHO, [place]  . Moderate mitral insufficiency JAN 2012   ECHO, [place]      Surgical History:  Past Surgical History:  Procedure Laterality Date  . ABDOMINAL HYSTERECTOMY     precancerous cervix,    . ABLATION OF DYSRHYTHMIC FOCUS  August 2013   [facility], Dr. Marcello Moores  . BREAST SURGERY     bilateral, benign biopsies  .  CARDIOVERSION N/A 11/23/2020   Procedure: CARDIOVERSION;  Surgeon: Corey Skains, MD;  Location: [facility] ORS;  Service: Cardiovascular;  Laterality: N/A;  . CARDIOVERSION N/A 01/17/2021   Procedure: CARDIOVERSION;  Surgeon: Corey Skains, MD;  Location: [facility] ORS;  Service: Cardiovascular;  Laterality: N/A;  . MAXILLARY SINUS LIFT  1990's   [name]  . oophorectomy    . squamous cell removal Right    right leg calf area.     Home Meds: Prior to Admission medications   Medication Sig Start Date End Date Taking? Authorizing Provider  acetaminophen (TYLENOL) 500 MG tablet Take 500 mg by mouth every 6 (six) hours as needed for moderate pain or headache.   Yes [provider]  amiodarone (PACERONE) 200 MG tablet Take 1 tablet (200 mg total) by mouth 2 (two) times daily.   Yes [provider]  b complex vitamins capsule Take 1 capsule by mouth daily.   Yes [provider]  bisacodyl (DULCOLAX) 5 MG EC tablet Take 5 mg by mouth daily as needed for moderate constipation.   Yes [provider]  Cholecalciferol (VITAMIN D3) 125 MCG (5000 UT) TABS Take 5,000 Units by mouth daily.   Yes [provider]  diltiazem (CARDIZEM) 60 MG tablet Take 60 mg by mouth 3 (three) times daily.   Yes [provider]  diphenhydrAMINE (BENADRYL) 25 mg capsule Take 25 mg by mouth every 6 (six) hours as needed for allergies.   Yes [provider]  docusate sodium (COLACE) 100 MG capsule Take 100  mg by mouth daily as needed for mild constipation.   Yes [provider]  furosemide (LASIX) 20 MG tablet TAKE 1 TABLET EVERY DAY Patient taking differently: Take 20 mg by mouth daily. 08/28/20  Yes Crecencio Mc, MD  Glucosamine-Chondroit-Vit C-Mn (GLUCOSAMINE 1500 COMPLEX PO) Take 1,500 mg by mouth daily.   Yes [provider]  Glycerin-Polysorbate 80 (REFRESH DRY EYE THERAPY OP) Place 1 drop into both eyes 2 (two) times daily.   Yes  [provider]  magnesium oxide (MAG-OX) 400 MG tablet Take 400 mg by mouth in the morning and at bedtime.   Yes [provider]  metoprolol succinate (TOPROL-XL) 25 MG 24 hr tablet Take 50 mg by mouth in the morning and at bedtime. 11/17/20  Yes [provider]  Multiple Vitamin (MULTIVITAMIN WITH MINERALS) TABS tablet Take 0.5 tablets by mouth 2 (two) times daily.   Yes [provider]  Omega-3 Fatty Acids (FISH OIL) 1000 MG CAPS Take 2,000 mg by mouth in the morning, at noon, and at bedtime.   Yes [provider]  omeprazole (PRILOSEC) 20 MG capsule TAKE 1 CAPSULE EVERY DAY Patient taking differently: Take 20 mg by mouth daily before breakfast. 08/28/20  Yes Crecencio Mc, MD  vitamin E 1000 UNIT capsule Take 1,000 Units by mouth 2 (two) times daily.   Yes [provider]  warfarin (COUMADIN) 3 MG tablet Take 1.5-3 mg by mouth See admin instructions. Take 1.5 mg Fridays and Sundays and 3 mg other days of the week. (Takes at night)   Yes [provider]  albuterol (PROVENTIL HFA;VENTOLIN HFA) 108 (90 BASE) MCG/ACT inhaler Inhale 2 puffs into the lungs every 6 (six) hours as needed. Patient not taking: No sig reported 05/15/15   Crecencio Mc, MD  benzonatate (TESSALON) 200 MG capsule Take 1 capsule (200 mg total) by mouth 3 (three) times daily as needed for cough. Patient not taking: No sig reported 10/31/20   Crecencio Mc, MD  doxycycline (VIBRA-TABS) 100 MG tablet Take 1 tablet (100 mg total) by mouth 2 (two) times daily. Patient not taking: Reported on 01/29/2021 10/31/20   Crecencio Mc, MD  levalbuterol [facility] HFA) 45 MCG/ACT inhaler Inhale 1-2 puffs into the lungs every 4 (four) hours as needed for wheezing. 08/13/12 08/13/12  Crecencio Mc, MD    Inpatient Medications:  . amiodarone  300 mg Oral BID  . furosemide  20 mg Intravenous BID  . metoprolol succinate  25 mg Oral Daily   . heparin 1,100 Units/hr  (01/30/21 0726)    Allergies:  Allergies  Allergen Reactions  . Prednisone Palpitations    A. fib  . Pulmicort [Budesonide] Shortness Of Breath, Itching, Swelling and Other (See Comments)    Felt as if things were crawly on her  . Clonidine Other (See Comments)    Pt felt like things were crawling on her arms.  Amador Cunas [Loteprednol Etabonate]     Broke out around the eye  . Morphine And Related Itching and Other (See Comments)    "creepy crawly feeling"  . Trovan [Alatrofloxacin Mesylate] Other (See Comments)    "effected the whole system"  . Zelnorm [Tegaserod Maleate] Itching    Felt as if things were crawly on her   . Erythromycin Rash    Social History   Socioeconomic History  . Marital status: Married    Spouse name: [name]   . Number of children: 2  . Years of  education: Not on file  . Highest education level: Not on file  Occupational History  . Not on file  Tobacco Use  . Smoking status: Never Smoker  . Smokeless tobacco: Never Used  Substance and Sexual Activity  . Alcohol use: Yes    Alcohol/week: 4.0 standard drinks    Types: 4 Glasses of wine per week    Comment: Occ.  . Drug use: No  . Sexual activity: Never  Other Topics Concern  . Not on file  Social History Narrative   Lives at home with spouse    Social Determinants of Health   Financial Resource Strain: Low Risk   . Difficulty of Paying Living Expenses: Not hard at all  Food Insecurity: No Food Insecurity  . Worried About Charity fundraiser in the Last Year: Never true  . Ran Out of Food in the Last Year: Never true  Transportation Needs: No Transportation Needs  . Lack of Transportation (Medical): No  . Lack of Transportation (Non-Medical): No  Physical Activity: Not on file  Stress: No Stress Concern Present  . Feeling of Stress : Not at all  Social Connections: Unknown  . Frequency of Communication with Friends and Family: More than three times a week  . Frequency of Social  Gatherings with Friends and Family: More than three times a week  . Attends Religious Services: Not on file  . Active Member of Clubs or Organizations: Not on file  . Attends Archivist Meetings: Not on file  . Marital Status: Married  Human resources officer Violence: Not At Risk  . Fear of Current or Ex-Partner: No  . Emotionally Abused: No  . Physically Abused: No  . Sexually Abused: No     Family History  Problem Relation Age of Onset  . Cancer Mother        Breast  . Hyperlipidemia Father   . Heart disease Father   . Hypertension Father   . Kidney disease Father   . Cancer Maternal Grandfather        colon CA  . Diverticulitis Sister      Review of Systems Positive for shortness of breath Negative for: General:  chills, fever, night sweats or weight changes.  Cardiovascular: PND orthopnea syncope dizziness  Dermatological skin lesions rashes Respiratory: Cough congestion Urologic: Frequent urination urination at night and hematuria Abdominal: negative for nausea, vomiting, diarrhea, bright red blood per rectum, melena, or hematemesis Neurologic: negative for visual changes, and/or hearing changes  All other systems reviewed and are otherwise negative except as noted above.  Labs: No results for input(s): CKTOTAL, CKMB, TROPONINI in the last 72 hours. Lab Results  Component Value Date   WBC 8.8 01/30/2021   HGB 11.6 (L) 01/30/2021   HCT 33.0 (L) 01/30/2021   MCV 90.4 01/30/2021   PLT 223 01/30/2021    Recent Labs  Lab 01/29/21 1819  NA 120*  K 4.3  CL 86*  CO2 20*  BUN 24*  CREATININE 0.93  CALCIUM 9.3  PROT 6.8  BILITOT 1.4*  ALKPHOS 77  ALT 100*  AST 83*  GLUCOSE 155*   Lab Results  Component Value Date   CHOL 243 (H) 04/05/2020   HDL 58.10 04/05/2020   LDLCALC 160 (H) 04/05/2020   TRIG 125.0 04/05/2020   No results found for: DDIMER  Radiology/Studies:  DG Chest 2 View  Result Date: 01/29/2021 CLINICAL DATA:  Shortness of breath  EXAM: CHEST - 2 VIEW COMPARISON:  10/31/2020 FINDINGS:  Right-sided central venous port tip over the distal SVC. Cardiomegaly with small bilateral pleural effusions and basilar airspace disease. No pneumothorax. IMPRESSION: Cardiomegaly with small bilateral pleural effusions and basilar airspace disease, atelectasis versus pneumonia. Electronically Signed   By: Donavan Foil M.D.   On: 01/29/2021 19:03    EKG: Atrial fibrillation with nonspecific ST and T wave changes  Weights: Filed Weights   01/29/21 1808  Weight: 77.1 kg     Physical Exam: Blood pressure 100/68, pulse 91, temperature 97.6 F (36.4 C), temperature source Oral, resp. rate 20, height 5\' 6"  (1.676 m), weight 77.1 kg, SpO2 97 %. Body mass index is 27.44 kg/m. General: Well developed, well nourished, in no acute distress. Head eyes ears nose throat: Normocephalic, atraumatic, sclera non-icteric, no xanthomas, nares are without discharge. No apparent thyromegaly and/or mass  Lungs: Normal respiratory effort.  no wheezes, few basilar rales, no rhonchi.  Heart: Irregular with normal S1 S2. no murmur gallop, no rub, PMI is normal size and placement, carotid upstroke normal without bruit, jugular venous pressure is normal Abdomen: Soft, non-tender, non-distended with normoactive bowel sounds. No hepatomegaly. No rebound/guarding. No obvious abdominal masses. Abdominal aorta is normal size without bruit Extremities: Trace edema. no cyanosis, no clubbing, no ulcers  Peripheral : 2+ bilateral upper extremity pulses, 2+ bilateral femoral pulses, 2+ bilateral dorsal pedal pulse Neuro: Alert and oriented. No facial asymmetry. No focal deficit. Moves all extremities spontaneously. Musculoskeletal: Normal muscle tone without kyphosis Psych:  Responds to questions appropriately with a normal affect.    Assessment: 80 year old female with hypertension hyperlipidemia and paroxysmal nonvalvular atrial fibrillation with rapid ventricular rate  causing some mild congestive heart failure now breathing much better on oxygen and needing further intervention including electrical cardioversion after transesophageal echocardiogram.  Patient understands all the risks and benefits of these procedures.  This includes possibility death stroke heart attack and esophageal perforation and significant side effects of medication.  The patient is at low risk for general anesthesia  Plan: 1.  Continue heparin for further risk reduction in stroke with atrial fibrillation 2.  Continue heart rate and rhythm potential control with amiodarone and beta-blocker 3.  Proceed to transesophageal echocardiogram and electrical cardioversion as per above  Signed, Corey Skains M.D. [facility] 01/30/2021, 8:29 AM

## 2021-01-30 NOTE — ED Notes (Signed)
Secure chat sent to IP RN for report.

## 2021-01-30 NOTE — Consult Note (Signed)
ANTICOAGULATION CONSULT NOTE - Initial Consult  Pharmacy Consult for heparin infusion / Warfarin Indication: atrial fibrillation  Allergies  Allergen Reactions  . Prednisone Palpitations    A. fib  . Pulmicort [Budesonide] Shortness Of Breath, Itching, Swelling and Other (See Comments)    Felt as if things were crawly on her  . Clonidine Other (See Comments)    Pt felt like things were crawling on her arms.  Amador Cunas [Loteprednol Etabonate]     Broke out around the eye  . Morphine And Related Itching and Other (See Comments)    "creepy crawly feeling"  . Trovan [Alatrofloxacin Mesylate] Other (See Comments)    "effected the whole system"  . Zelnorm [Tegaserod Maleate] Itching    Felt as if things were crawly on her   . Erythromycin Rash    Patient Measurements: Height: 5\' 6"  (167.6 cm) Weight: 77.1 kg (170 lb) IBW/kg (Calculated) : 59.3 Heparin Dosing Weight: 75 kg   Vital Signs: Temp: 97.6 F (36.4 C) (03/28 1811) Temp Source: Oral (03/28 1811) BP: 103/79 (03/29 0430) Pulse Rate: 84 (03/29 0430)  Labs: Recent Labs    01/29/21 1819 01/29/21 2135 01/30/21 0435  HGB 14.1  --  11.6*  HCT 40.6  --  33.0*  PLT 296  --  223  APTT 29  --   --   LABPROT 19.5*  --  21.3*  INR 1.7*  --  1.9*  HEPARINUNFRC  --   --  0.58  CREATININE 0.93  --   --   TROPONINIHS 18* 17  --     Estimated Creatinine Clearance: 50.6 mL/min (by C-G formula based on SCr of 0.93 mg/dL).   Medical History: Past Medical History:  Diagnosis Date  . A-fib ([place])   . Chronic sinusitis   . Fibrocystic breast disease   . History of pneumonia 1999  . Hyperlipidemia   . Irritable bowel syndrome   . Lichen planus   . 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 Mild tricuspid insufficiency Jan 2012   ECHO, [place]  . Moderate mitral insufficiency JAN 2012   ECHO, [place]    Medications:  Warfarin PTA: Warfarin 1.5 mg on Sunday and Wed and 3 mg all other days (source: cardiology office  visit note 01/25/21) INR goal: 2-3   Assessment: 80 y.o. female with history of A. fib, on chronic coumadin, status post ablation presented with increasing SOB and planned cardioversion 01/30/21. Pharmacy has been consulted to transition patient from coumadin to IV heparin for pending cardioversion.   Baseline labs ordered and reviewed. INR 1.7  Heparin Dosing Weight: 75 kg  Goal of Therapy:  Heparin level 0.3-0.7 units/ml Monitor platelets by anticoagulation protocol: Yes   Plan:   3/29 @ 0435:  HL = 0.58, INR = 1.9 - Will continue pt on current rate and recheck HL on 3/29 @ 1300. - Will restart warfarin on 3/29 PM after cardioversion/ablation.   Orene Desanctis, PharmD Clinical Pharmacist  01/30/2021,5:12 AM

## 2021-01-30 NOTE — ED Notes (Signed)
Admitting provider at bedside.

## 2021-01-30 NOTE — Progress Notes (Addendum)
PROGRESS NOTE    Carol [name]  FGH:[id number] DOB: 02-17-1941 DOA: 01/29/2021 PCP: Crecencio Mc, MD   Brief Narrative: 80 year old past medical history significant for A. fib on chronic Coumadin, IBS, history of lymphoma, fibrocystic breast disease who was a scheduled for ablation of her A. fib tomorrow by cardiology.  She presented with worsening shortness of breath, lower extremity edema, 10 pound weight gain.  Evaluation in the ED patient was found to have a sodium of 120, BNP elevated 3000, urine osmolality 261, white count 11.7, INR 1.7 chest x-ray showed cardiomegaly with a small bilateral pleural effusion bibasilar airspace disease probably atelectasis.  She is admitted for acute heart failure exacerbation and A. Fib.    Assessment & Plan:   Principal Problem:   Rapid atrial fibrillation (HCC) Active Problems:   Hyperlipidemia   CHF exacerbation (HCC)   Leg edema   Hyponatremia   1-Atrial fibrillation: Paroxysmal A. fib Started on heparin drip, INR was subtherapeutic Cardiology has been consulted, plan for ablation On amiodarone, heart rate in the 110s Continue with Coumadin. Metoprolol due to decompensated heart failure  2-Acute on Chronic Systolic Heart failure, per echo ejection fraction 20% 2013. -Presented with shortness of breath, BNP elevated 3000, chest x-ray with pleural effusion, weight gain 10 pounds -Continue with IV Lasix -Daily weight, strict I's and O's -Follow repeat echo  3-Hyponatremia: Likely in the setting of congestive heart failure: Monitor on IV Lasix Urine Molality 261 Monitor sodium 4-mild transaminases: Likely in the setting of heart failure 5-Mild leukocytosis: Reactive resolved   Estimated body mass index is 27.44 kg/m as calculated from the following:   Height as of this encounter: 5\' 6"  (1.676 m).   Weight as of this encounter: 77.1 kg.   DVT prophylaxis: Heparin drip Code Status: Full code Family Communication:  No family at bedside Disposition Plan:  Status is: Inpatient  Remains inpatient appropriate because:IV treatments appropriate due to intensity of illness or inability to take PO   Dispo: The patient is from: Home              Anticipated d/c is to: Home              Patient currently is not medically stable to d/c.   Difficult to place patient No        Consultants:   Cardiology  Procedures:   TEE  Antimicrobials:    Subjective: Breathing a little bit better this morning, she reports 10 pounds weight gain, worsening shortness of breath over the last few days.  She has not been able to ambulate a few steps due to shortness of breath  Objective: Vitals:   01/30/21 0100 01/30/21 0430 01/30/21 0600 01/30/21 0700  BP: (!) 116/98 103/79 (!) 121/94 110/81  Pulse: 68 84 (!) 51 84  Resp: 17 17 (!) 24 19  Temp:      TempSrc:      SpO2: 97% 99% 97% 97%  Weight:      Height:        Intake/Output Summary (Last 24 hours) at 01/30/2021 0811 Last data filed at 01/30/2021 6789 Gross per 24 hour  Intake 132 ml  Output --  Net 132 ml   Filed Weights   01/29/21 1808  Weight: 77.1 kg    Examination:  General exam: Appears calm and comfortable  Respiratory system: Bilateral crackles, mild tachypnea Cardiovascular system: S1 & S2 heard, regular rate rhythm Gastrointestinal system: Abdomen is nondistended, soft and nontender.  No organomegaly or masses felt. Normal bowel sounds heard. Central nervous system: Alert and oriented. No focal neurological deficits. Extremities: Symmetric 5 x 5 power.  +2 edema    Data Reviewed: I have personally reviewed following labs and imaging studies  CBC: Recent Labs  Lab 01/29/21 1819 01/30/21 0435  WBC 11.7* 8.8  HGB 14.1 11.6*  HCT 40.6 33.0*  MCV 90.6 90.4  PLT 296 694   Basic Metabolic Panel: Recent Labs  Lab 01/29/21 1819  NA 120*  K 4.3  CL 86*  CO2 20*  GLUCOSE 155*  BUN 24*  CREATININE 0.93  CALCIUM 9.3    GFR: Estimated Creatinine Clearance: 50.6 mL/min (by C-G formula based on SCr of 0.93 mg/dL). Liver Function Tests: Recent Labs  Lab 01/29/21 1819  AST 83*  ALT 100*  ALKPHOS 77  BILITOT 1.4*  PROT 6.8  ALBUMIN 3.7   No results for input(s): LIPASE, AMYLASE in the last 168 hours. No results for input(s): AMMONIA in the last 168 hours. Coagulation Profile: Recent Labs  Lab 01/29/21 1819 01/30/21 0435  INR 1.7* 1.9*   Cardiac Enzymes: No results for input(s): CKTOTAL, CKMB, CKMBINDEX, TROPONINI in the last 168 hours. BNP (last 3 results) No results for input(s): PROBNP in the last 8760 hours. HbA1C: No results for input(s): HGBA1C in the last 72 hours. CBG: No results for input(s): GLUCAP in the last 168 hours. Lipid Profile: No results for input(s): CHOL, HDL, LDLCALC, TRIG, CHOLHDL, LDLDIRECT in the last 72 hours. Thyroid Function Tests: No results for input(s): TSH, T4TOTAL, FREET4, T3FREE, THYROIDAB in the last 72 hours. Anemia Panel: No results for input(s): VITAMINB12, FOLATE, FERRITIN, TIBC, IRON, RETICCTPCT in the last 72 hours. Sepsis Labs: Recent Labs  Lab 01/29/21 1819  PROCALCITON <0.10    Recent Results (from the past 240 hour(s))  SARS CORONAVIRUS 2 (TAT 6-24 HRS) Nasopharyngeal Nasopharyngeal Swab     Status: None   Collection Time: 01/26/21 11:53 AM   Specimen: Nasopharyngeal Swab  Result Value Ref Range Status   SARS Coronavirus 2 NEGATIVE NEGATIVE Final    Comment: (NOTE) SARS-CoV-2 target nucleic acids are NOT DETECTED.  The SARS-CoV-2 RNA is generally detectable in upper and lower respiratory specimens during the acute phase of infection. Negative results do not preclude SARS-CoV-2 infection, do not rule out co-infections with other pathogens, and should not be used as the sole basis for treatment or other patient management decisions. Negative results must be combined with clinical observations, patient history, and epidemiological  information. The expected result is Negative.  Fact Sheet for Patients: SugarRoll.be  Fact Sheet for Healthcare Providers: https://www.woods-mathews.com/  This test is not yet approved or cleared by the [place] FDA and  has been authorized for detection and/or diagnosis of SARS-CoV-2 by FDA under an Emergency Use Authorization (EUA). This EUA will remain  in effect (meaning this test can be used) for the duration of the COVID-19 declaration under Se ction 564(b)(1) of the Act, 21 U.S.C. section 360bbb-3(b)(1), unless the authorization is terminated or revoked sooner.  Performed at [facility], [place] [address] [place], [place] [address]   Resp Panel by RT-PCR (Flu A&B, Covid) Nasopharyngeal Swab     Status: None   Collection Time: 01/29/21  7:46 PM   Specimen: Nasopharyngeal Swab; Nasopharyngeal(NP) swabs in vial transport medium  Result Value Ref Range Status   SARS Coronavirus 2 by RT PCR NEGATIVE NEGATIVE Final    Comment: (NOTE) SARS-CoV-2 target nucleic acids are NOT  DETECTED.  The SARS-CoV-2 RNA is generally detectable in upper respiratory specimens during the acute phase of infection. The lowest concentration of SARS-CoV-2 viral copies this assay can detect is 138 copies/mL. A negative result does not preclude SARS-Cov-2 infection and should not be used as the sole basis for treatment or other patient management decisions. A negative result may occur with  improper specimen collection/handling, submission of specimen other than nasopharyngeal swab, presence of viral mutation(s) within the areas targeted by this assay, and inadequate number of viral copies(<138 copies/mL). A negative result must be combined with clinical observations, patient history, and epidemiological information. The expected result is Negative.  Fact Sheet for Patients:  EntrepreneurPulse.com.au  Fact Sheet for Healthcare  Providers:  IncredibleEmployment.be  This test is no t yet approved or cleared by the [place] FDA and  has been authorized for detection and/or diagnosis of SARS-CoV-2 by FDA under an Emergency Use Authorization (EUA). This EUA will remain  in effect (meaning this test can be used) for the duration of the COVID-19 declaration under Section 564(b)(1) of the Act, 21 U.S.C.section 360bbb-3(b)(1), unless the authorization is terminated  or revoked sooner.       Influenza A by PCR NEGATIVE NEGATIVE Final   Influenza B by PCR NEGATIVE NEGATIVE Final    Comment: (NOTE) The Xpert Xpress SARS-CoV-2/FLU/RSV plus assay is intended as an aid in the diagnosis of influenza from Nasopharyngeal swab specimens and should not be used as a sole basis for treatment. Nasal washings and aspirates are unacceptable for Xpert Xpress SARS-CoV-2/FLU/RSV testing.  Fact Sheet for Patients: EntrepreneurPulse.com.au  Fact Sheet for Healthcare Providers: IncredibleEmployment.be  This test is not yet approved or cleared by the [place] FDA and has been authorized for detection and/or diagnosis of SARS-CoV-2 by FDA under an Emergency Use Authorization (EUA). This EUA will remain in effect (meaning this test can be used) for the duration of the COVID-19 declaration under Section 564(b)(1) of the Act, 21 U.S.C. section 360bbb-3(b)(1), unless the authorization is terminated or revoked.  Performed at [facility], [address], [place], [place] [address]          Radiology Studies: DG Chest 2 View  Result Date: 01/29/2021 CLINICAL DATA:  Shortness of breath EXAM: CHEST - 2 VIEW COMPARISON:  10/31/2020 FINDINGS: Right-sided central venous port tip over the distal SVC. Cardiomegaly with small bilateral pleural effusions and basilar airspace disease. No pneumothorax. IMPRESSION: Cardiomegaly with small bilateral pleural effusions and  basilar airspace disease, atelectasis versus pneumonia. Electronically Signed   By: Donavan Foil M.D.   On: 01/29/2021 19:03        Scheduled Meds: . furosemide  20 mg Intravenous BID   Continuous Infusions: . heparin 1,100 Units/hr (01/30/21 0726)     LOS: 1 day    Time spent: 35 minutes     Rayonna Heldman A Jamael Hoffmann, MD [facility]   If 7PM-7AM, please contact night-coverage [email or web address]  01/30/2021, 8:11 AM

## 2021-01-30 NOTE — Consult Note (Signed)
ANTICOAGULATION CONSULT NOTE - Initial Consult  Pharmacy Consult for heparin infusion / Warfarin Indication: atrial fibrillation  Allergies  Allergen Reactions  . Prednisone Palpitations    A. fib  . Pulmicort [Budesonide] Shortness Of Breath, Itching, Swelling and Other (See Comments)    Felt as if things were crawly on her  . Clonidine Other (See Comments)    Pt felt like things were crawling on her arms.  Amador Cunas [Loteprednol Etabonate]     Broke out around the eye  . Morphine And Related Itching and Other (See Comments)    "creepy crawly feeling"  . Trovan [Alatrofloxacin Mesylate] Other (See Comments)    "effected the whole system"  . Zelnorm [Tegaserod Maleate] Itching    Felt as if things were crawly on her   . Erythromycin Rash    Patient Measurements: Height: 5\' 6"  (167.6 cm) Weight: 77.1 kg (170 lb) IBW/kg (Calculated) : 59.3 Heparin Dosing Weight: 75 kg   Vital Signs: Temp: 97.6 F (36.4 C) (03/28 1811) Temp Source: Oral (03/28 1811) BP: 116/98 (03/29 0100) Pulse Rate: 68 (03/29 0100)  Labs: Recent Labs    01/29/21 1819 01/29/21 2135  HGB 14.1  --   HCT 40.6  --   PLT 296  --   APTT 29  --   LABPROT 19.5*  --   INR 1.7*  --   CREATININE 0.93  --   TROPONINIHS 18* 17    Estimated Creatinine Clearance: 50.6 mL/min (by C-G formula based on SCr of 0.93 mg/dL).   Medical History: Past Medical History:  Diagnosis Date  . A-fib ([place])   . Chronic sinusitis   . Fibrocystic breast disease   . History of pneumonia 1999  . Hyperlipidemia   . Irritable bowel syndrome   . Lichen planus   . 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 Mild tricuspid insufficiency Jan 2012   ECHO, [place]  . Moderate mitral insufficiency JAN 2012   ECHO, [place]    Medications:  Warfarin PTA: Warfarin 1.5 mg on Sunday and Wed and 3 mg all other days (source: cardiology office visit note 01/25/21) INR goal: 2-3   Assessment: 80 y.o. female with history of A.  fib, on chronic coumadin, status post ablation presented with increasing SOB and planned cardioversion 01/30/21. Pharmacy has been consulted to transition patient from coumadin to IV heparin for pending cardioversion.   Baseline labs ordered and reviewed. INR 1.7  Heparin Dosing Weight: 75 kg  Goal of Therapy:  Heparin level 0.3-0.7 units/ml Monitor platelets by anticoagulation protocol: Yes   Plan:  Withhold bolus and start heparin infusion at 1100 units/hr Check anti-Xa (heparin) level in 8 hours and daily while on heparin Continue to monitor H&H and platelets  - Will restart warfarin on 3/29 PM after cardioversion/ablation.   Orene Desanctis, PharmD Clinical Pharmacist  01/30/2021,3:01 AM

## 2021-01-30 NOTE — ED Notes (Signed)
Pt lying in bed resting with eyes closed; RR even and unlabored on 2L O2 via [place] - cont pulse ox and cardiac monitoring maintained.  Hep drip infusing @1100units /hr to R chest port; dressing dry and intact- site free of complications.  Will continue to maintain plan of care and monitor for acute changes as patient awaits bed assignment for admission.

## 2021-01-30 NOTE — Consult Note (Signed)
[place] for heparin infusion / Warfarin Indication: atrial fibrillation  Allergies  Allergen Reactions  . Prednisone Palpitations    A. fib  . Pulmicort [Budesonide] Shortness Of Breath, Itching, Swelling and Other (See Comments)    Felt as if things were crawly on her  . Clonidine Other (See Comments)    Pt felt like things were crawling on her arms.  Amador Cunas [Loteprednol Etabonate]     Broke out around the eye  . Morphine And Related Itching and Other (See Comments)    "creepy crawly feeling"  . Trovan [Alatrofloxacin Mesylate] Other (See Comments)    "effected the whole system"  . Zelnorm [Tegaserod Maleate] Itching    Felt as if things were crawly on her   . Erythromycin Rash    Patient Measurements: Height: 5\' 6"  (167.6 cm) Weight: 77.1 kg (170 lb) IBW/kg (Calculated) : 59.3 Heparin Dosing Weight: 75 kg   Vital Signs: BP: 100/68 (03/29 0800) Pulse Rate: 91 (03/29 0800)  Labs: Recent Labs    01/29/21 1819 01/29/21 2135 01/30/21 0435 01/30/21 1357  HGB 14.1  --  11.6*  --   HCT 40.6  --  33.0*  --   PLT 296  --  223  --   APTT 29  --   --   --   LABPROT 19.5*  --  21.3*  --   INR 1.7*  --  1.9*  --   HEPARINUNFRC  --   --  0.58 0.74*  CREATININE 0.93  --   --   --   TROPONINIHS 18* 17  --   --     Estimated Creatinine Clearance: 50.6 mL/min (by C-G formula based on SCr of 0.93 mg/dL).   Medical History: Past Medical History:  Diagnosis Date  . A-fib ([place])   . Chronic sinusitis   . Fibrocystic breast disease   . History of pneumonia 1999  . Hyperlipidemia   . Irritable bowel syndrome   . Lichen planus   . 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 Mild tricuspid insufficiency Jan 2012   ECHO, [place]  . Moderate mitral insufficiency JAN 2012   ECHO, [place]    Medications:  Warfarin PTA: Warfarin 1.5 mg on Sunday and Fri and 3 mg all other days INR goal: 2-3   Assessment: 80 y.o. female with  history of A. fib, on chronic coumadin, status post ablation presented with increasing SOB and planned cardioversion. Pharmacy has been consulted for heparin given subtherapeutic INR 1.7 on admission.   Date INR Warfarin 3/28 1.7 -- 3/29 1.9   Goal of Therapy:  Heparin level 0.3-0.7 units/ml Monitor platelets by anticoagulation protocol: Yes   Plan:  3/29 1357 HL 0.74 supratherapeutic. Decrease heparin infusion to 950 units/hr. Recheck HL 3/30 at 0000.  INR trend upward with last dose 3/27 (missed 3/28). Will give 50% of home dose today (warfarin 1.5 mg).  Plan for TEE and cardioversion. Daily INR and CBC for now.  Tawnya Crook, PharmD Clinical Pharmacist  01/30/2021,3:23 PM

## 2021-01-31 ENCOUNTER — Other Ambulatory Visit: Payer: Self-pay

## 2021-01-31 ENCOUNTER — Inpatient Hospital Stay: Payer: Medicare Other | Admitting: Anesthesiology

## 2021-01-31 ENCOUNTER — Encounter
Admission: EM | Disposition: A | Discharge status: Other Acute Inpatient Hospital | Payer: Self-pay | Source: Home / Self Care | Attending: Internal Medicine

## 2021-01-31 ENCOUNTER — Inpatient Hospital Stay
Admit: 2021-01-31 | Discharge: 2021-01-31 | Disposition: A | Discharge status: Home / Self Care | Payer: Medicare Other | Attending: Internal Medicine | Admitting: Internal Medicine

## 2021-01-31 DIAGNOSIS — I4891 Unspecified atrial fibrillation: Secondary | ICD-10-CM | POA: Diagnosis not present

## 2021-01-31 HISTORY — PX: TEE WITHOUT CARDIOVERSION: SHX5443

## 2021-01-31 HISTORY — PX: CARDIOVERSION: SHX1299

## 2021-01-31 LAB — CBC
HCT: 38.6 % (ref 36.0–46.0)
Hemoglobin: 13 g/dL (ref 12.0–15.0)
MCH: 31.3 pg (ref 26.0–34.0)
MCHC: 33.7 g/dL (ref 30.0–36.0)
MCV: 93 fL (ref 80.0–100.0)
Platelets: 262 10*3/uL (ref 150–400)
RBC: 4.15 MIL/uL (ref 3.87–5.11)
RDW: 14.2 % (ref 11.5–15.5)
WBC: 7.4 10*3/uL (ref 4.0–10.5)
nRBC: 0 % (ref 0.0–0.2)

## 2021-01-31 LAB — HEPARIN LEVEL (UNFRACTIONATED)
Heparin Unfractionated: 0.52 IU/mL (ref 0.30–0.70)
Heparin Unfractionated: 0.64 IU/mL (ref 0.30–0.70)
Heparin Unfractionated: 0.76 IU/mL — ABNORMAL HIGH (ref 0.30–0.70)

## 2021-01-31 LAB — BASIC METABOLIC PANEL
Anion gap: 9 (ref 5–15)
BUN: 25 mg/dL — ABNORMAL HIGH (ref 8–23)
CO2: 26 mmol/L (ref 22–32)
Calcium: 8.4 mg/dL — ABNORMAL LOW (ref 8.9–10.3)
Chloride: 95 mmol/L — ABNORMAL LOW (ref 98–111)
Creatinine, Ser: 1.04 mg/dL — ABNORMAL HIGH (ref 0.44–1.00)
GFR, Estimated: 54 mL/min — ABNORMAL LOW (ref >60)
Glucose, Bld: 99 mg/dL (ref 70–99)
Potassium: 3.5 mmol/L (ref 3.5–5.1)
Sodium: 130 mmol/L — ABNORMAL LOW (ref 135–145)

## 2021-01-31 LAB — PROTIME-INR
INR: 1.8 — ABNORMAL HIGH (ref 0.8–1.2)
Prothrombin Time: 20.5 seconds — ABNORMAL HIGH (ref 11.4–15.2)

## 2021-01-31 SURGERY — ECHOCARDIOGRAM, TRANSESOPHAGEAL
Anesthesia: General

## 2021-01-31 MED ORDER — DILTIAZEM LOAD VIA INFUSION
10.0000 mg | Freq: Once | INTRAVENOUS | Status: DC
  Filled 2021-01-31: qty 10

## 2021-01-31 MED ORDER — SODIUM CHLORIDE FLUSH 0.9 % IV SOLN
INTRAVENOUS | Status: AC
  Filled 2021-01-31: qty 10

## 2021-01-31 MED ORDER — DILTIAZEM HCL-DEXTROSE 125-5 MG/125ML-% IV SOLN (PREMIX): 5.0000 mg/h | INTRAVENOUS | Status: DC

## 2021-01-31 MED ORDER — DILTIAZEM HCL ER COATED BEADS 120 MG PO CP24
120.0000 mg | ORAL_CAPSULE | Freq: Every day | ORAL | Status: DC
  Administered 2021-02-01: 120 mg via ORAL
  Filled 2021-01-31: qty 1

## 2021-01-31 MED ORDER — PROPOFOL 10 MG/ML IV BOLUS
INTRAVENOUS | Status: DC | PRN
  Administered 2021-01-31: 10 mg via INTRAVENOUS
  Administered 2021-01-31: 20 mg via INTRAVENOUS
  Administered 2021-01-31: 10 mg via INTRAVENOUS
  Administered 2021-01-31: 20 mg via INTRAVENOUS
  Administered 2021-01-31: 30 mg via INTRAVENOUS
  Administered 2021-01-31: 40 mg via INTRAVENOUS
  Administered 2021-01-31: 50 mg via INTRAVENOUS

## 2021-01-31 MED ORDER — BUTAMBEN-TETRACAINE-BENZOCAINE 2-2-14 % EX AERO
INHALATION_SPRAY | CUTANEOUS | Status: AC
  Filled 2021-01-31: qty 5

## 2021-01-31 MED ORDER — BISACODYL 5 MG PO TBEC
5.0000 mg | DELAYED_RELEASE_TABLET | Freq: Every day | ORAL | Status: DC | PRN
  Administered 2021-01-31 – 2021-02-01 (×2): 5 mg via ORAL
  Filled 2021-01-31 (×2): qty 1

## 2021-01-31 MED ORDER — METOPROLOL SUCCINATE ER 100 MG PO TB24
100.0000 mg | ORAL_TABLET | Freq: Two times a day (BID) | ORAL | Status: DC
  Administered 2021-01-31 – 2021-02-01 (×3): 100 mg via ORAL
  Filled 2021-01-31 (×3): qty 1

## 2021-01-31 MED ORDER — LIDOCAINE VISCOUS HCL 2 % MT SOLN
OROMUCOSAL | Status: AC
  Filled 2021-01-31: qty 15

## 2021-01-31 MED ORDER — DILTIAZEM HCL ER COATED BEADS 120 MG PO CP24
120.0000 mg | ORAL_CAPSULE | Freq: Every day | ORAL | Status: DC
  Administered 2021-01-31: 120 mg via ORAL
  Filled 2021-01-31: qty 1

## 2021-01-31 MED ORDER — WARFARIN SODIUM 3 MG PO TABS
3.0000 mg | ORAL_TABLET | Freq: Once | ORAL | Status: AC
  Administered 2021-01-31: 3 mg via ORAL
  Filled 2021-01-31: qty 1

## 2021-01-31 NOTE — Progress Notes (Signed)
*  PRELIMINARY RESULTS* Echocardiogram Echocardiogram Transesophageal has been performed.  Carol [name] 01/31/2021, 9:13 AM

## 2021-01-31 NOTE — Anesthesia Procedure Notes (Signed)
Date/Time: 01/31/2021 8:39 AM Performed by: Lily Peer, Jayleena Stille, CRNA Pre-anesthesia Checklist: Patient identified, Emergency Drugs available, Patient being monitored, Timeout performed and Suction available Oxygen Delivery Method: Nasal cannula Induction Type: IV induction

## 2021-01-31 NOTE — Progress Notes (Signed)
PROGRESS NOTE  Carol [name] EYC:[id number] DOB: 1940/12/05 DOA: 01/29/2021 PCP: Crecencio Mc, MD   LOS: 2 days   Brief Narrative / Interim history: 80 year old female with history of A. fib on chronic Coumadin, IBS, prior lymphoma, fibrocystic breast disease comes to the hospital with worsening shortness of breath, lower extremity edema and abdominal bleed came.  She was found to have heart failure and admission have A. fib with RVR.  Cardiology consulted.  Subjective / 24h Interval events: Appears comfortable, denies any chest pain.  Still complains of shortness of breath.  Just got back from her TEE/cardioversion  Assessment & Plan: Principal Problem A. fib with RVR, paroxysmal A. Fib -INR was subtherapeutic and she was started on heparin drip.  Cardiology has been consulted, underwent a TEE and attempted cardioversion x3 which was unsuccessful.  Has been placed on amiodarone, continue.  Still with RVR at times, further plans per cardiology  Active Problems Acute hypoxic respiratory failure due to acute pulmonary edema due to acute on chronic systolic CHF - EF 14%.  Continue Lasix, wean off oxygen as tolerated.  Hyponatremia-hypervolemic, continue Lasix   Elevated LFTs -mild, possibly related to heart failure  Leukocytosis-reactive, resolved   Scheduled Meds: . amiodarone  300 mg Oral BID  . Chlorhexidine Gluconate Cloth  6 each Topical Daily  . furosemide  20 mg Intravenous BID  . mouth rinse  15 mL Mouth Rinse BID  . sodium chloride flush  3 mL Intravenous Q12H  . sodium chloride flush      . warfarin  3 mg Oral ONCE-1600  . Warfarin - Pharmacist Dosing Inpatient   Does not apply q1600   Continuous Infusions: . heparin 800 Units/hr (01/31/21 1008)   PRN Meds:.acetaminophen, ondansetron (ZOFRAN) IV  Diet Orders (From admission, onward)    Start     Ordered   01/31/21 1026  Diet Heart Room service appropriate? Yes; Fluid consistency: Thin  Diet effective  now       Question Answer Comment  Room service appropriate? Yes   Fluid consistency: Thin      01/31/21 1025          DVT prophylaxis:  warfarin (COUMADIN) tablet 3 mg     Code Status: Full Code  Family Communication: husband and son at bedside   Status is: Inpatient  Remains inpatient appropriate because:Inpatient level of care appropriate due to severity of illness   Dispo: The patient is from: Home              Anticipated d/c is to: Home              Patient currently is not medically stable to d/c.   Difficult to place patient No  Level of care: Med-Surg  Consultants:  Cardiology   Procedures:  TEE DCV - unsuccessful, 3/30  Microbiology  None   Antimicrobials: None     Objective: Vitals:   01/31/21 0904 01/31/21 0905 01/31/21 1016 01/31/21 1203  BP:   (!) 122/95 115/89  Pulse: (!) 128 (!) 121 (!) 130 91  Resp: (!) 21 (!) 23 18 18   Temp:   97.8 F (36.6 C) 97.7 F (36.5 C)  TempSrc:   Oral Oral  SpO2: 99% 99% 99% 99%  Weight:      Height:        Intake/Output Summary (Last 24 hours) at 01/31/2021 1219 Last data filed at 01/31/2021 1202 Gross per 24 hour  Intake 593.59 ml  Output 2900 ml  Net -2306.41 ml   Filed Weights   02-27-2021 1808 01/30/21 1738 01/31/21 0827  Weight: 77.1 kg 81.4 kg 81.4 kg    Examination:  Constitutional: NAD Eyes: no scleral icterus ENMT: Mucous membranes are moist.  Neck: normal, supple Respiratory: Diminished at the bases, no wheezing, normal respiratory effort Cardiovascular: Irregularly irregular, no murmurs appreciated.  Trace edema Abdomen: non distended, no tenderness. Bowel sounds positive.  Musculoskeletal: no clubbing / cyanosis.  Skin: no rashes Neurologic: CN 2-12 grossly intact. Strength 5/5 in all 4.  Psychiatric: Normal judgment and insight. Alert and oriented x 3. Normal mood.    Data Reviewed: I have independently reviewed following labs and imaging studies   CBC: Recent Labs  Lab  27-Feb-2021 1819 01/30/21 0435 01/30/21 1750 01/31/21 1009  WBC 11.7* 8.8 10.2 7.4  HGB 14.1 11.6* 13.1 13.0  HCT 40.6 33.0* 37.9 38.6  MCV 90.6 90.4 90.7 93.0  PLT 296 223 274 664   Basic Metabolic Panel: Recent Labs  Lab 02-27-2021 1819 01/30/21 1750 01/31/21 0012  NA 120* 128* 130*  K 4.3 3.8 3.5  CL 86* 93* 95*  CO2 20* 24 26  GLUCOSE 155* 96 99  BUN 24* 26* 25*  CREATININE 0.93 0.95 1.04*  CALCIUM 9.3 8.5* 8.4*   Liver Function Tests: Recent Labs  Lab 2021-02-27 1819  AST 83*  ALT 100*  ALKPHOS 77  BILITOT 1.4*  PROT 6.8  ALBUMIN 3.7   Coagulation Profile: Recent Labs  Lab 02-27-21 1819 01/30/21 0435 01/31/21 0012  INR 1.7* 1.9* 1.8*   HbA1C: No results for input(s): HGBA1C in the last 72 hours. CBG: No results for input(s): GLUCAP in the last 168 hours.  Recent Results (from the past 240 hour(s))  SARS CORONAVIRUS 2 (TAT 6-24 HRS) Nasopharyngeal Nasopharyngeal Swab     Status: None   Collection Time: 01/26/21 11:53 AM   Specimen: Nasopharyngeal Swab  Result Value Ref Range Status   SARS Coronavirus 2 NEGATIVE NEGATIVE Final    Comment: (NOTE) SARS-CoV-2 target nucleic acids are NOT DETECTED.  The SARS-CoV-2 RNA is generally detectable in upper and lower respiratory specimens during the acute phase of infection. Negative results do not preclude SARS-CoV-2 infection, do not rule out co-infections with other pathogens, and should not be used as the sole basis for treatment or other patient management decisions. Negative results must be combined with clinical observations, patient history, and epidemiological information. The expected result is Negative.  Fact Sheet for Patients: SugarRoll.be  Fact Sheet for Healthcare Providers: https://www.woods-mathews.com/  This test is not yet approved or cleared by the [place] FDA and  has been authorized for detection and/or diagnosis of SARS-CoV-2 by FDA under  an Emergency Use Authorization (EUA). This EUA will remain  in effect (meaning this test can be used) for the duration of the COVID-19 declaration under Se ction 564(b)(1) of the Act, 21 U.S.C. section 360bbb-3(b)(1), unless the authorization is terminated or revoked sooner.  Performed at [facility], [place] [address] [place], [place] [address]   Resp Panel by RT-PCR (Flu A&B, Covid) Nasopharyngeal Swab     Status: None   Collection Time: February 27, 2021  7:46 PM   Specimen: Nasopharyngeal Swab; Nasopharyngeal(NP) swabs in vial transport medium  Result Value Ref Range Status   SARS Coronavirus 2 by RT PCR NEGATIVE NEGATIVE Final    Comment: (NOTE) SARS-CoV-2 target nucleic acids are NOT DETECTED.  The SARS-CoV-2 RNA is generally detectable in upper respiratory specimens during the acute phase  of infection. The lowest concentration of SARS-CoV-2 viral copies this assay can detect is 138 copies/mL. A negative result does not preclude SARS-Cov-2 infection and should not be used as the sole basis for treatment or other patient management decisions. A negative result may occur with  improper specimen collection/handling, submission of specimen other than nasopharyngeal swab, presence of viral mutation(s) within the areas targeted by this assay, and inadequate number of viral copies(<138 copies/mL). A negative result must be combined with clinical observations, patient history, and epidemiological information. The expected result is Negative.  Fact Sheet for Patients:  EntrepreneurPulse.com.au  Fact Sheet for Healthcare Providers:  IncredibleEmployment.be  This test is no t yet approved or cleared by the [place] FDA and  has been authorized for detection and/or diagnosis of SARS-CoV-2 by FDA under an Emergency Use Authorization (EUA). This EUA will remain  in effect (meaning this test can be used) for the duration of the COVID-19 declaration  under Section 564(b)(1) of the Act, 21 U.S.C.section 360bbb-3(b)(1), unless the authorization is terminated  or revoked sooner.       Influenza A by PCR NEGATIVE NEGATIVE Final   Influenza B by PCR NEGATIVE NEGATIVE Final    Comment: (NOTE) The Xpert Xpress SARS-CoV-2/FLU/RSV plus assay is intended as an aid in the diagnosis of influenza from Nasopharyngeal swab specimens and should not be used as a sole basis for treatment. Nasal washings and aspirates are unacceptable for Xpert Xpress SARS-CoV-2/FLU/RSV testing.  Fact Sheet for Patients: EntrepreneurPulse.com.au  Fact Sheet for Healthcare Providers: IncredibleEmployment.be  This test is not yet approved or cleared by the [place] FDA and has been authorized for detection and/or diagnosis of SARS-CoV-2 by FDA under an Emergency Use Authorization (EUA). This EUA will remain in effect (meaning this test can be used) for the duration of the COVID-19 declaration under Section 564(b)(1) of the Act, 21 U.S.C. section 360bbb-3(b)(1), unless the authorization is terminated or revoked.  Performed at [facility], [address], [place], [place] [address]      Radiology Studies: No results found.   Marzetta Board, MD, PhD [facility]  Between 7 am - 7 pm I am available, please contact me via Amion or Securechat  Between 7 pm - 7 am I am not available, please contact night coverage MD/APP via Amion

## 2021-01-31 NOTE — Anesthesia Preprocedure Evaluation (Addendum)
Anesthesia Evaluation  Patient identified by MRN, date of birth, ID band Patient awake    Reviewed: Allergy & Precautions, H&P , NPO status , Patient's Chart, lab work & pertinent test results  History of Anesthesia Complications Negative for: history of anesthetic complications  Airway Mallampati: II  TM Distance: >3 FB     Dental  (+) Teeth Intact   Pulmonary shortness of breath and with exertion, neg sleep apnea, COPD,   Decreased breath sounds at bilateral lung bases  breath sounds clear to auscultation       Cardiovascular (-) angina+CHF  (-) Past MI and (-) Cardiac Stents + dysrhythmias Atrial Fibrillation  Rhythm:irregular Rate:Tachycardia  Echo 07/12/20: NORMAL LV SYSTOLIC FUNCTION WITH AN ESTIMATED EF = 45-50%  NORMAL RIGHT VENTRICULAR SYSTOLIC FUNCTION  MODERATE TRICUSPID AND MITRAL VALVE INSUFFICIENCY  NO VALVULAR STENOSIS  MODERATE LA ENLARGEMENT  MILD MITRAL VALVE PROLAPSE     Neuro/Psych negative neurological ROS  negative psych ROS   GI/Hepatic negative GI ROS, Neg liver ROS,   Endo/Other  negative endocrine ROS  Renal/GU negative Renal ROS  negative genitourinary   Musculoskeletal   Abdominal   Peds  Hematology Coumadin therapy for AFib   Anesthesia Other Findings Admitted through the ED yesterday with SOB and weakness, improved after diuresis  Past Medical History: No date: A-fib ([place]) No date: Chronic sinusitis No date: Fibrocystic breast disease 1999: History of pneumonia No date: Hyperlipidemia No date: Irritable bowel syndrome No date: Lichen planus August 2013: lymphoma     Comment:  Low grade B cell Jan 2012: Mild tricuspid insufficiency     Comment:  ECHO, [place] JAN 2012: Moderate mitral insufficiency     Comment:  ECHO, [place]  Past Surgical History: No date: ABDOMINAL HYSTERECTOMY     Comment:  precancerous cervix,   August 2013: ABLATION OF DYSRHYTHMIC FOCUS      Comment:  [place], Dr. Marcello Moores No date: BREAST SURGERY     Comment:  bilateral, benign biopsies 11/23/2020: CARDIOVERSION; N/A     Comment:  Procedure: CARDIOVERSION;  Surgeon: Corey Skains,               MD;  Location: [facility];  Service: Cardiovascular;                Laterality: N/A; 01/17/2021: CARDIOVERSION; N/A     Comment:  Procedure: CARDIOVERSION;  Surgeon: Corey Skains,               MD;  Location: [facility];  Service: Cardiovascular;                Laterality: N/A; 1990's: MAXILLARY SINUS LIFT     Comment:  [name] No date: oophorectomy No date: squamous cell removal; Right     Comment:  right leg calf area.  BMI    Body Mass Index: 28.96 kg/m      Reproductive/Obstetrics negative OB ROS                           Anesthesia Physical Anesthesia Plan  ASA: III  Anesthesia Plan: General   Post-op Pain Management:    Induction:   PONV Risk Score and Plan:   Airway Management Planned: Nasal Cannula  Additional Equipment:   Intra-op Plan:   Post-operative Plan:   Informed Consent: I have reviewed the patients History and Physical, chart, labs and discussed the procedure including the risks, benefits and alternatives for the proposed anesthesia  with the patient or authorized representative who has indicated his/her understanding and acceptance.     Dental Advisory Given  Plan Discussed with: Anesthesiologist, CRNA and Surgeon  Anesthesia Plan Comments:       Anesthesia Quick Evaluation

## 2021-01-31 NOTE — Progress Notes (Addendum)
[facility] Encounter Note  Patient: Carol [name] / Admit Date: 01/29/2021 / Date of Encounter: 01/31/2021, 5:43 PM   Subjective: Patient has had significant improvements in pulmonary edema and shortness of breath and diastolic dysfunction congestive heart failure likely secondary to atrial fibrillation with rapid ventricular rate.  Transesophageal echocardiogram shows normal LV systolic function with ejection fraction of 50% with mild mitral regurgitation and no significant other valvular heart disease and/or atrial thrombus  Electrical cardioversion failed to convert patient into normal sinus rhythm despite appropriate loading of amiodarone and heart rate control with other medication management  Discussion with team electrophysiology for further potential treatment options include ablate strategy for inpatient atrial fibrillation ablation strategy.  Long discussion with the team would suggest she would benefit from transfer to tertiary facility for these procedures  Review of Systems: Positive for: Shortness of breath Negative for: Vision change, hearing change, syncope, dizziness, nausea, vomiting,diarrhea, bloody stool, stomach pain, cough, congestion, diaphoresis, urinary frequency, urinary pain,skin lesions, skin rashes Others previously listed  Objective: Telemetry: Atrial fibrillation with rapid ventricular rate Physical Exam: Blood pressure 103/83, pulse 94, temperature 98.2 F (36.8 C), temperature source Oral, resp. rate 18, height 5\' 6"  (1.676 m), weight 81.4 kg, SpO2 96 %. Body mass index is 28.96 kg/m. General: Well developed, well nourished, in no acute distress. Head: Normocephalic, atraumatic, sclera non-icteric, no xanthomas, nares are without discharge. Neck: No apparent masses Lungs: Normal respirations with no wheezes, no rhonchi, few rales , no crackles   Heart: Irregular rate and rhythm, normal S1 S2, no murmur, no rub, no gallop,  PMI is normal size and placement, carotid upstroke normal without bruit, jugular venous pressure normal Abdomen: Soft, non-tender, non-distended with normoactive bowel sounds. No hepatosplenomegaly. Abdominal aorta is normal size without bruit Extremities: No edema, no clubbing, no cyanosis, no ulcers,  Peripheral: 2+ radial, 2+ femoral, 2+ dorsal pedal pulses Neuro: Alert and oriented. Moves all extremities spontaneously. Psych:  Responds to questions appropriately with a normal affect.   Intake/Output Summary (Last 24 hours) at 01/31/2021 1743 Last data filed at 01/31/2021 1600 Gross per 24 hour  Intake 711.59 ml  Output 3300 ml  Net -2588.41 ml    Inpatient Medications:  . amiodarone  300 mg Oral BID  . Chlorhexidine Gluconate Cloth  6 each Topical Daily  . diltiazem  120 mg Oral Daily  . furosemide  20 mg Intravenous BID  . mouth rinse  15 mL Mouth Rinse BID  . metoprolol succinate  100 mg Oral BID  . sodium chloride flush  3 mL Intravenous Q12H  . sodium chloride flush      . Warfarin - Pharmacist Dosing Inpatient   Does not apply q1600   Infusions:  . heparin 800 Units/hr (01/31/21 1008)    Labs: Recent Labs    01/30/21 1750 01/31/21 0012  NA 128* 130*  K 3.8 3.5  CL 93* 95*  CO2 24 26  GLUCOSE 96 99  BUN 26* 25*  CREATININE 0.95 1.04*  CALCIUM 8.5* 8.4*   Recent Labs    01/29/21 1819  AST 83*  ALT 100*  ALKPHOS 77  BILITOT 1.4*  PROT 6.8  ALBUMIN 3.7   Recent Labs    01/30/21 1750 01/31/21 1009  WBC 10.2 7.4  HGB 13.1 13.0  HCT 37.9 38.6  MCV 90.7 93.0  PLT 274 262   No results for input(s): CKTOTAL, CKMB, TROPONINI in the last 72 hours. Invalid input(s): POCBNP No results for input(s):  HGBA1C in the last 72 hours.   Weights: Filed Weights   01/29/21 1808 01/30/21 1738 01/31/21 0827  Weight: 77.1 kg 81.4 kg 81.4 kg     Radiology/Studies:  DG Chest 2 View  Result Date: 01/29/2021 CLINICAL DATA:  Shortness of breath EXAM: CHEST - 2  VIEW COMPARISON:  10/31/2020 FINDINGS: Right-sided central venous port tip over the distal SVC. Cardiomegaly with small bilateral pleural effusions and basilar airspace disease. No pneumothorax. IMPRESSION: Cardiomegaly with small bilateral pleural effusions and basilar airspace disease, atelectasis versus pneumonia. Electronically Signed   By: Donavan Foil M.D.   On: 01/29/2021 19:03   ECHO TEE  Result Date: 01/31/2021    TRANSESOPHOGEAL ECHO REPORT   Patient Name:   Carol [name] Date of Exam: 01/31/2021 Medical Rec #:  [id number]                Height:       66.0 in Accession #:    [id number]               Weight:       179.4 lb Date of Birth:  [birth year]                BSA:          1.910 m Patient Age:    80 years                 BP:           114/73 mmHg Patient Gender: F                        HR:           121 bpm. Exam Location:  [facility] Procedure: Transesophageal Echo, Color Doppler, Cardiac Doppler and Saline            Contrast Bubble Study Indications:     Atrial Fibrillation  History:         Patient has no prior history of Echocardiogram examinations.                  Arrythmias:Atrial Fibrillation.  Sonographer:     Sherrie Sport RDCS (AE) Referring Phys:  [place] Diagnosing Phys: [name] MD PROCEDURE: The transesophogeal probe was passed without difficulty through the esophogus of the patient. Sedation performed by performing physician. The patient was monitored while under deep sedation. The patient developed no complications during the procedure. IMPRESSIONS  1. Left ventricular ejection fraction, by estimation, is 55 to 60%. The left ventricle has normal function.  2. Right ventricular systolic function is normal. The right ventricular size is normal.  3. No left atrial/left atrial appendage thrombus was detected.  4. The mitral valve is normal in structure. Mild mitral valve regurgitation.  5. The aortic valve is normal in structure. Aortic valve regurgitation is  trivial.  6. Agitated saline contrast bubble study was negative, with no evidence of any interatrial shunt. FINDINGS  Left Ventricle: Left ventricular ejection fraction, by estimation, is 55 to 60%. The left ventricle has normal function. The left ventricular internal cavity size was normal in size. Right Ventricle: The right ventricular size is normal. No increase in right ventricular wall thickness. Right ventricular systolic function is normal. Left Atrium: Left atrial size was normal in size. No left atrial/left atrial appendage thrombus was detected. Right Atrium: Right atrial size was normal in size. Pericardium: There is no evidence of pericardial effusion. Mitral Valve:  The mitral valve is normal in structure. Mild mitral valve regurgitation. Tricuspid Valve: The tricuspid valve is normal in structure. Tricuspid valve regurgitation is trivial. Aortic Valve: The aortic valve is normal in structure. Aortic valve regurgitation is trivial. Pulmonic Valve: The pulmonic valve was normal in structure. Pulmonic valve regurgitation is trivial. Aorta: The aortic root and ascending aorta are structurally normal, with no evidence of dilitation. IAS/Shunts: No atrial level shunt detected by color flow Doppler. Agitated saline contrast was given intravenously to evaluate for intracardiac shunting. Agitated saline contrast bubble study was negative, with no evidence of any interatrial shunt. There  is no evidence of a patent foramen ovale. There is no evidence of an atrial septal defect. [name] MD Electronically signed by [name] MD Signature Date/Time: 01/31/2021/5:20:22 PM    Final      Assessment and Recommendation  80 y.o. female with paroxysmal nonvalvular atrial fibrillation previously well controlled on medication management now with recent exacerbation unable to control well having congestive heart failure and pulmonary edema due to rapid ventricular rate and now failed electrical cardioversion  without evidence of myocardial infarction 1.  Increase medication management for heart rate control of atrial fibrillation including metoprolol diltiazem combination with a heart rate control below 110 bpm 2.  Continue anticoagulation with warfarin for further risk reduction in stroke with atrial fibrillation.  Keeping heparin for now until INR in therapeutic range of greater than 2 3.  Transfer to [facility] electrophysiology for possible pace ablate and or inpatient atrial fibrillation ablation strategy  Signed, [name] M.D. FACC

## 2021-01-31 NOTE — Consult Note (Signed)
[facility] for heparin infusion / Warfarin Indication: atrial fibrillation  Allergies  Allergen Reactions  . Prednisone Palpitations    A. fib  . Pulmicort [Budesonide] Shortness Of Breath, Itching, Swelling and Other (See Comments)    Felt as if things were crawly on her  . Clonidine Other (See Comments)    Pt felt like things were crawling on her arms.  Amador Cunas [Loteprednol Etabonate]     Broke out around the eye  . Morphine And Related Itching and Other (See Comments)    "creepy crawly feeling"  . Trovan [Alatrofloxacin Mesylate] Other (See Comments)    "effected the whole system"  . Zelnorm [Tegaserod Maleate] Itching    Felt as if things were crawly on her   . Erythromycin Rash    Patient Measurements: Height: 5\' 6"  (167.6 cm) Weight: 81.4 kg (179 lb 6.4 oz) IBW/kg (Calculated) : 59.3 Heparin Dosing Weight: 75 kg   Vital Signs: Temp: 97.5 F (36.4 C) (03/29 2337) Temp Source: Oral (03/29 2337) BP: 104/86 (03/29 2337) Pulse Rate: 97 (03/29 2337)  Labs: Recent Labs    01/29/21 1819 01/29/21 2135 01/30/21 0435 01/30/21 1357 01/30/21 1750 01/31/21 0012  HGB 14.1  --  11.6*  --  13.1  --   HCT 40.6  --  33.0*  --  37.9  --   PLT 296  --  223  --  274  --   APTT 29  --   --   --   --   --   LABPROT 19.5*  --  21.3*  --   --  20.5*  INR 1.7*  --  1.9*  --   --  1.8*  HEPARINUNFRC  --   --  0.58 0.74*  --  0.64  CREATININE 0.93  --   --   --  0.95 1.04*  TROPONINIHS 18* 17  --   --   --   --     Estimated Creatinine Clearance: 46.4 mL/min (A) (by C-G formula based on SCr of 1.04 mg/dL (H)).   Medical History: Past Medical History:  Diagnosis Date  . A-fib ([place])   . Chronic sinusitis   . Fibrocystic breast disease   . History of pneumonia 1999  . Hyperlipidemia   . Irritable bowel syndrome   . Lichen planus   . 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 Mild tricuspid insufficiency Jan 2012   ECHO, [place]   . Moderate mitral insufficiency JAN 2012   ECHO, [place]    Medications:  Warfarin PTA: Warfarin 1.5 mg on Sunday and Fri and 3 mg all other days INR goal: 2-3   Assessment: 80 y.o. female with history of A. fib, on chronic coumadin, status post ablation presented with increasing SOB and planned cardioversion. Pharmacy has been consulted for heparin given subtherapeutic INR 1.7 on admission.   Date INR Warfarin 3/28 1.7 -- 3/29 1.9   Goal of Therapy:  Heparin level 0.3-0.7 units/ml Monitor platelets by anticoagulation protocol: Yes   Plan:  3/30:  HL @ 0012 = 0.64,  Therapeutic X 1  Will continue pt on current rate and recheck HL on 3/30 @ 0800.  INR trend upward with last dose 3/27 (missed 3/28). Will give 50% of home dose today (warfarin 1.5 mg).  Plan for TEE and cardioversion. Daily INR and CBC for now.  Orene Desanctis, PharmD Clinical Pharmacist  01/31/2021,12:55 AM

## 2021-01-31 NOTE — Procedures (Signed)
Transesophageal echocardiogram preliminary report  Carol [name] [id number] 1940-11-20  Preliminary diagnosis  Atrial fibrillation with or without cardioversion  Postprocedural diagnosis  Atrial fibrillation without left atrial appendage thrombus  Time out A timeout was performed by the nursing staff and physicians specifically identifying the procedure performed, identification of the patient, the type of sedation, all allergies and medications, all pertinent medical history, and presedation assessment of nasopharynx. The patient and or family understand the risks of the procedure including the rare risks of death, stroke, heart attack, esophogeal perforation, sore throat, and reaction to medications given.  Moderate sedation During this procedure the patient has received general anesthesia sedation.  The patient had continued monitoring of heart rate, oxygenation, blood pressure, respiratory rate, and extent of signs of sedation throughout the entire procedure.  The patient received this moderate sedation over a period of 35 minutes.  Both the nursing staff and I were present during the procedure when the patient had moderate sedation for 100% of the time.  Treatment considerations  Electrical cardioversion of atrial fibrillation due to no evidence of atrial appendage thrombus  For further details of transesophageal echocardiogram please refer to final report.  Signed,  Corey Skains M.D. [facility] 01/31/2021 9:11 AM

## 2021-01-31 NOTE — Consult Note (Signed)
[facility] for heparin infusion / Warfarin Indication: atrial fibrillation  Allergies  Allergen Reactions  . Prednisone Palpitations    A. fib  . Pulmicort [Budesonide] Shortness Of Breath, Itching, Swelling and Other (See Comments)    Felt as if things were crawly on her  . Clonidine Other (See Comments)    Pt felt like things were crawling on her arms.  Amador Cunas [Loteprednol Etabonate]     Broke out around the eye  . Morphine And Related Itching and Other (See Comments)    "creepy crawly feeling"  . Trovan [Alatrofloxacin Mesylate] Other (See Comments)    "effected the whole system"  . Zelnorm [Tegaserod Maleate] Itching    Felt as if things were crawly on her   . Erythromycin Rash    Patient Measurements: Height: 5\' 6"  (167.6 cm) Weight: 81.4 kg (179 lb 7.3 oz) IBW/kg (Calculated) : 59.3 Heparin Dosing Weight: 75 kg   Vital Signs: Temp: 97.9 F (36.6 C) (03/30 0827) Temp Source: Oral (03/30 0827) BP: 140/101 (03/30 0827) Pulse Rate: 125 (03/30 0827)  Labs: Recent Labs    01/29/21 1819 01/29/21 1819 01/29/21 2135 01/30/21 0435 01/30/21 1357 01/30/21 1750 01/31/21 0012 01/31/21 0800  HGB 14.1  --   --  11.6*  --  13.1  --   --   HCT 40.6  --   --  33.0*  --  37.9  --   --   PLT 296  --   --  223  --  274  --   --   APTT 29  --   --   --   --   --   --   --   LABPROT 19.5*  --   --  21.3*  --   --  20.5*  --   INR 1.7*  --   --  1.9*  --   --  1.8*  --   HEPARINUNFRC  --    < >  --  0.58 0.74*  --  0.64 0.76*  CREATININE 0.93  --   --   --   --  0.95 1.04*  --   TROPONINIHS 18*  --  17  --   --   --   --   --    < > = values in this interval not displayed.    Estimated Creatinine Clearance: 46.4 mL/min (A) (by C-G formula based on SCr of 1.04 mg/dL (H)).   Medical History: Past Medical History:  Diagnosis Date  . A-fib ([place])   . Chronic sinusitis   . Fibrocystic breast disease   . History of pneumonia 1999  .  Hyperlipidemia   . Irritable bowel syndrome   . Lichen planus   . 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 Mild tricuspid insufficiency Jan 2012   ECHO, [place]  . Moderate mitral insufficiency JAN 2012   ECHO, [place]    Medications:  Warfarin PTA: Warfarin 1.5 mg on Sunday and Fri and 3 mg all other days INR goal: 2-3   Assessment: 80 y.o. female with history of A. fib, on chronic coumadin, status post ablation presented with increasing SOB and planned cardioversion. Pharmacy has been consulted for heparin given subtherapeutic INR 1.7 on admission.  DDI with warfarin: amiodarone, which can increase INR (home med).  Date INR Warfarin 3/28 1.7 -- 3/29 1.9 1.5 mg 3/30 0.76 3 mg  3/30 0012 HL 0.64 3/30 0800 HL  0.76    Goal of Therapy:  Heparin level 0.3-0.7 units/ml Monitor platelets by anticoagulation protocol: Yes   Plan:  Heparin: Heparin level is slightly supratherapeutic. Will decrease heparin infusion to 800 units/hr. Recheck heparin level in 8 hours after rate change. CBC daily while on heparin.   Warfarin: INR is subtherapeutic. Will give warfarin 3 mg x 1 (home dose). Daily INR ordered.   Plan for TEE and cardioversion 3/30.   Oswald Hillock, PharmD, BCPS Clinical Pharmacist  01/31/2021,8:50 AM

## 2021-01-31 NOTE — Consult Note (Signed)
[facility] for heparin infusion / Warfarin Indication: atrial fibrillation  Allergies  Allergen Reactions  . Prednisone Palpitations    A. fib  . Pulmicort [Budesonide] Shortness Of Breath, Itching, Swelling and Other (See Comments)    Felt as if things were crawly on her  . Clonidine Other (See Comments)    Pt felt like things were crawling on her arms.  Amador Cunas [Loteprednol Etabonate]     Broke out around the eye  . Morphine And Related Itching and Other (See Comments)    "creepy crawly feeling"  . Trovan [Alatrofloxacin Mesylate] Other (See Comments)    "effected the whole system"  . Zelnorm [Tegaserod Maleate] Itching    Felt as if things were crawly on her   . Erythromycin Rash    Patient Measurements: Height: 5\' 6"  (167.6 cm) Weight: 81.4 kg (179 lb 7.3 oz) IBW/kg (Calculated) : 59.3 Heparin Dosing Weight: 75 kg   Vital Signs: Temp: 98.2 F (36.8 C) (03/30 1623) Temp Source: Oral (03/30 1623) BP: 103/83 (03/30 1623) Pulse Rate: 94 (03/30 1623)  Labs: Recent Labs    01/29/21 1819 01/29/21 2135 01/30/21 0435 01/30/21 1357 01/30/21 1750 01/31/21 0012 01/31/21 0800 01/31/21 1009 01/31/21 1805  HGB 14.1  --  11.6*  --  13.1  --   --  13.0  --   HCT 40.6  --  33.0*  --  37.9  --   --  38.6  --   PLT 296  --  223  --  274  --   --  262  --   APTT 29  --   --   --   --   --   --   --   --   LABPROT 19.5*  --  21.3*  --   --  20.5*  --   --   --   INR 1.7*  --  1.9*  --   --  1.8*  --   --   --   HEPARINUNFRC  --   --  0.58   < >  --  0.64 0.76*  --  0.52  CREATININE 0.93  --   --   --  0.95 1.04*  --   --   --   TROPONINIHS 18* 17  --   --   --   --   --   --   --    < > = values in this interval not displayed.    Estimated Creatinine Clearance: 46.4 mL/min (A) (by C-G formula based on SCr of 1.04 mg/dL (H)).   Medical History: Past Medical History:  Diagnosis Date  . A-fib ([place])   . Chronic sinusitis   .  Fibrocystic breast disease   . History of pneumonia 1999  . Hyperlipidemia   . Irritable bowel syndrome   . Lichen planus   . 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 Mild tricuspid insufficiency Jan 2012   ECHO, [place]  . Moderate mitral insufficiency JAN 2012   ECHO, [place]    Medications:  Warfarin PTA: Warfarin 1.5 mg on Sunday and Fri and 3 mg all other days INR goal: 2-3   Assessment: 80 y.o. female with history of A. fib, on chronic coumadin, status post ablation presented with increasing SOB and planned cardioversion. Pharmacy has been consulted for heparin given subtherapeutic INR 1.7 on admission.  DDI with warfarin: amiodarone, which can increase INR (home med).  Underwent TEE and cardioversion 3/30.  Date INR Warfarin 3/28 1.7 -- 3/29 1.9 1.5 mg 3/30 0.76 3 mg  3/30 0012 HL 0.64 3/30 0800 HL 0.76  3/30 1805 HL 0.52   Goal of Therapy:  Heparin level 0.3-0.7 units/ml Monitor platelets by anticoagulation protocol: Yes   Plan:  Heparin: Heparin level is therapeutic. Will continue heparin infusion at 800 units/hr. Recheck heparin level in 8 hours. CBC daily while on heparin.   Warfarin: INR is subtherapeutic. Warfarin 3 mg x 1 given (home dose). Daily INR ordered.   Benn Moulder, PharmD Pharmacy Resident  01/31/2021 7:13 PM

## 2021-01-31 NOTE — Plan of Care (Signed)
Remains in Atrial fib with controlled rated.  Dyspnea on exertion continues.  Room air saturations WNL.  Oxygen at [facility] for comfort.

## 2021-01-31 NOTE — CV Procedure (Signed)
Electrical Cardioversion Procedure Note Carol [name] [id number] Feb 04, 1941  Procedure: Electrical Cardioversion Indications:  Paroxysmal non valvular atrial fibrillation  Procedure Details Consent: Risks of procedure as well as the alternatives and risks of each were explained to the (patient/caregiver).  Consent for procedure obtained. Time Out: Verified patient identification, verified procedure, site/side was marked, verified correct patient position, special equipment/implants available, medications/allergies/relevent history reviewed, required imaging and test results available.  Performed  Patient placed on cardiac monitor, pulse oximetry, supplemental oxygen as necessary.  Sedation given: Propofol and versed as per anesthesia  Pacer pads placed anterior and posterior chest.  Cardioverted 3 time(s).  Cardioverted at [place].  Evaluation Findings: Post procedure EKG shows: Atrial Fibrillation and without success Complications: None Patient did tolerate procedure well.   Carol [name] M.D. [facility] 01/31/2021, 9:10 AM

## 2021-01-31 NOTE — Progress Notes (Signed)
Dr. Nehemiah Massed speaking with pt., husband, and son at bedside re: TEE, cardioversion attempt/results. All parties involved verbalize understanding of conversation.

## 2021-01-31 NOTE — Transfer of Care (Signed)
Immediate Anesthesia Transfer of Care Note  Patient: Carol [name]  Procedure(s) Performed: TRANSESOPHAGEAL ECHOCARDIOGRAM (TEE) (N/A ) CARDIOVERSION (N/A )  Patient Location: Cath Lab  Anesthesia Type:General  Level of Consciousness: awake, alert  and oriented  Airway & Oxygen Therapy: Patient Spontanous Breathing and Patient connected to nasal cannula oxygen  Post-op Assessment: Report given to RN and Post -op Vital signs reviewed and stable  Post vital signs: Reviewed and stable  Last Vitals:  Vitals Value Taken Time  BP 114/73 01/31/21 0903  Temp    Pulse 121 01/31/21 0905  Resp 23 01/31/21 0905  SpO2 99 % 01/31/21 0905    Last Pain:  Vitals:   01/31/21 0827  TempSrc: Oral  PainSc: 0-No pain         Complications: No complications documented.

## 2021-02-01 ENCOUNTER — Encounter: Payer: Self-pay | Admitting: Internal Medicine

## 2021-02-01 DIAGNOSIS — I5043 Acute on chronic combined systolic (congestive) and diastolic (congestive) heart failure: Secondary | ICD-10-CM

## 2021-02-01 DIAGNOSIS — I5023 Acute on chronic systolic (congestive) heart failure: Secondary | ICD-10-CM

## 2021-02-01 DIAGNOSIS — E7800 Pure hypercholesterolemia, unspecified: Secondary | ICD-10-CM | POA: Diagnosis not present

## 2021-02-01 DIAGNOSIS — I4891 Unspecified atrial fibrillation: Secondary | ICD-10-CM | POA: Diagnosis not present

## 2021-02-01 DIAGNOSIS — E871 Hypo-osmolality and hyponatremia: Secondary | ICD-10-CM

## 2021-02-01 LAB — TROPONIN I (HIGH SENSITIVITY)
Troponin I (High Sensitivity): 19 ng/L — ABNORMAL HIGH (ref <18)
Troponin I (High Sensitivity): 18 ng/L — ABNORMAL HIGH (ref <18)

## 2021-02-01 LAB — CBC
HCT: 37.1 % (ref 36.0–46.0)
Hemoglobin: 12.5 g/dL (ref 12.0–15.0)
MCH: 31.5 pg (ref 26.0–34.0)
MCHC: 33.7 g/dL (ref 30.0–36.0)
MCV: 93.5 fL (ref 80.0–100.0)
Platelets: 240 10*3/uL (ref 150–400)
RBC: 3.97 MIL/uL (ref 3.87–5.11)
RDW: 14.1 % (ref 11.5–15.5)
WBC: 6.7 10*3/uL (ref 4.0–10.5)
nRBC: 0 % (ref 0.0–0.2)

## 2021-02-01 LAB — HEPARIN LEVEL (UNFRACTIONATED): Heparin Unfractionated: 0.4 IU/mL (ref 0.30–0.70)

## 2021-02-01 LAB — PROTIME-INR
INR: 1.6 — ABNORMAL HIGH (ref 0.8–1.2)
Prothrombin Time: 18.6 seconds — ABNORMAL HIGH (ref 11.4–15.2)

## 2021-02-01 LAB — BASIC METABOLIC PANEL
Anion gap: 9 (ref 5–15)
BUN: 19 mg/dL (ref 8–23)
CO2: 27 mmol/L (ref 22–32)
Calcium: 8.1 mg/dL — ABNORMAL LOW (ref 8.9–10.3)
Chloride: 99 mmol/L (ref 98–111)
Creatinine, Ser: 0.71 mg/dL (ref 0.44–1.00)
GFR, Estimated: >60 mL/min (ref >60)
Glucose, Bld: 106 mg/dL — ABNORMAL HIGH (ref 70–99)
Potassium: 3.2 mmol/L — ABNORMAL LOW (ref 3.5–5.1)
Sodium: 135 mmol/L (ref 135–145)

## 2021-02-01 MED ORDER — DILTIAZEM HCL ER COATED BEADS 120 MG PO CP24: 120.0000 mg | ORAL_CAPSULE | Freq: Every day | ORAL | Status: DC

## 2021-02-01 MED ORDER — MILK AND MOLASSES ENEMA
1.0000 | Freq: Every day | RECTAL | Status: DC | PRN
  Filled 2021-02-01: qty 240

## 2021-02-01 MED ORDER — METOPROLOL SUCCINATE ER 100 MG PO TB24: 100.0000 mg | ORAL_TABLET | Freq: Two times a day (BID) | ORAL | Status: DC

## 2021-02-01 MED ORDER — WARFARIN SODIUM 3 MG PO TABS
3.0000 mg | ORAL_TABLET | Freq: Once | ORAL | Status: AC
  Administered 2021-02-01: 3 mg via ORAL
  Filled 2021-02-01: qty 1

## 2021-02-01 MED ORDER — SENNOSIDES-DOCUSATE SODIUM 8.6-50 MG PO TABS
2.0000 | ORAL_TABLET | Freq: Once | ORAL | Status: AC
  Administered 2021-02-01: 2 via ORAL
  Filled 2021-02-01: qty 2

## 2021-02-01 MED ORDER — GLYCERIN (LAXATIVE) 2 G RE SUPP
1.0000 | Freq: Every day | RECTAL | Status: DC | PRN
  Filled 2021-02-01 (×2): qty 1

## 2021-02-01 MED ORDER — SENNOSIDES-DOCUSATE SODIUM 8.6-50 MG PO TABS
2.0000 | ORAL_TABLET | Freq: Every evening | ORAL | Status: DC | PRN
  Administered 2021-02-01: 2 via ORAL
  Filled 2021-02-01: qty 2

## 2021-02-01 MED ORDER — POLYETHYLENE GLYCOL 3350 17 G PO PACK
17.0000 g | PACK | Freq: Every day | ORAL | Status: DC
  Administered 2021-02-01: 17 g via ORAL
  Filled 2021-02-01: qty 1

## 2021-02-01 MED ORDER — MAGNESIUM HYDROXIDE 400 MG/5ML PO SUSP
30.0000 mL | Freq: Every day | ORAL | Status: DC | PRN
  Administered 2021-02-01: 30 mL via ORAL
  Filled 2021-02-01: qty 30

## 2021-02-01 NOTE — Consult Note (Signed)
[facility] for heparin infusion / Warfarin Indication: atrial fibrillation  Allergies  Allergen Reactions  . Prednisone Palpitations    A. fib  . Pulmicort [Budesonide] Shortness Of Breath, Itching, Swelling and Other (See Comments)    Felt as if things were crawly on her  . Clonidine Other (See Comments)    Pt felt like things were crawling on her arms.  Amador Cunas [Loteprednol Etabonate]     Broke out around the eye  . Morphine And Related Itching and Other (See Comments)    "creepy crawly feeling"  . Trovan [Alatrofloxacin Mesylate] Other (See Comments)    "effected the whole system"  . Zelnorm [Tegaserod Maleate] Itching    Felt as if things were crawly on her   . Erythromycin Rash    Patient Measurements: Height: 5\' 6"  (167.6 cm) Weight: 81.4 kg (179 lb 7.3 oz) IBW/kg (Calculated) : 59.3 Heparin Dosing Weight: 75 kg   Vital Signs: Temp: 98 F (36.7 C) (03/31 0734) Temp Source: Oral (03/31 0734) BP: 113/92 (03/31 0734) Pulse Rate: 108 (03/31 0734)  Labs: Recent Labs    01/29/21 1819 01/29/21 2135 01/30/21 0435 01/30/21 1357 01/30/21 1750 01/31/21 0012 01/31/21 0800 01/31/21 1009 01/31/21 1805 02/01/21 0142 02/01/21 0249 02/01/21 0418  HGB 14.1  --  11.6*  --  13.1  --   --  13.0  --  12.5  --   --   HCT 40.6  --  33.0*  --  37.9  --   --  38.6  --  37.1  --   --   PLT 296  --  223  --  274  --   --  262  --  240  --   --   APTT 29  --   --   --   --   --   --   --   --   --   --   --   LABPROT 19.5*  --  21.3*  --   --  20.5*  --   --   --  18.6*  --   --   INR 1.7*  --  1.9*  --   --  1.8*  --   --   --  1.6*  --   --   HEPARINUNFRC  --   --  0.58   < >  --  0.64 0.76*  --  0.52 0.40  --   --   CREATININE 0.93  --   --   --  0.95 1.04*  --   --   --  0.71  --   --   TROPONINIHS 18* 17  --   --   --   --   --   --   --   --  19* 18*   < > = values in this interval not displayed.    Estimated Creatinine Clearance:  60.3 mL/min (by C-G formula based on SCr of 0.71 mg/dL).   Medical History: Past Medical History:  Diagnosis Date  . A-fib ([place])   . Chronic sinusitis   . Fibrocystic breast disease   . History of pneumonia 1999  . Hyperlipidemia   . Irritable bowel syndrome   . Lichen planus   . 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 Mild tricuspid insufficiency Jan 2012   ECHO, [place]  . Moderate mitral insufficiency JAN 2012   ECHO, [place]  Medications:  Warfarin PTA: Warfarin 1.5 mg on Sunday and Fri and 3 mg all other days INR goal: 2-3   Assessment: 80 y.o. female with history of A. fib, on chronic coumadin, status post ablation presented with increasing SOB and planned cardioversion. Pharmacy has been consulted for heparin given subtherapeutic INR 1.7 on admission.  DDI with warfarin: amiodarone, which can increase INR (home med). Underwent TEE and cardioversion 3/30.  Date INR Warfarin 3/28 1.7 -- 3/29 1.9 1.5 mg 3/30 1.8 3 mg 3/31     1.6       3 mg   3/30 0012 HL 0.64 3/30 0800 HL 0.76  3/30 1805 HL 0.52 3/31 0142 HL 0.4, therapeutic X 2    Goal of Therapy:  Heparin level 0.3-0.7 units/ml Monitor platelets by anticoagulation protocol: Yes   Plan:  Heparin: Heparin level is slightly therapeutic. Will continue heparin infusion at 800 units/hr. Recheck heparin level and CBC with AM labs.    Warfarin: INR is subtherapeutic, probably due to no doses given on 3/28 and a low dose of warfarin given on 3/29. Warfarin 3 mg x 1 given (home dose). Predict INR will trend up tomorrow. Daily INR ordered.   Oswald Hillock, PharmD, BCPS 02/01/2021 8:08 AM

## 2021-02-01 NOTE — Plan of Care (Signed)
  Problem: Education: Goal: Knowledge of General Education information will improve Description: Including pain rating scale, medication(s)/side effects and non-pharmacologic comfort measures Outcome: Progressing   Problem: Health Behavior/Discharge Planning: Goal: Ability to manage health-related needs will improve Outcome: Progressing   Problem: Clinical Measurements: Goal: Ability to maintain clinical measurements within normal limits will improve Outcome: Progressing Goal: Will remain free from infection Outcome: Progressing Goal: Diagnostic test results will improve Outcome: Progressing Goal: Respiratory complications will improve Outcome: Progressing Goal: Cardiovascular complication will be avoided Outcome: Progressing   Problem: Activity: Goal: Risk for activity intolerance will decrease Outcome: Progressing   Problem: Nutrition: Goal: Adequate nutrition will be maintained Outcome: Progressing   Problem: Coping: Goal: Level of anxiety will decrease Outcome: Progressing   Problem: Elimination: Goal: Will not experience complications related to bowel motility Outcome: Progressing Goal: Will not experience complications related to urinary retention Outcome: Progressing   Problem: Pain Managment: Goal: General experience of comfort will improve Outcome: Progressing   Problem: Safety: Goal: Ability to remain free from injury will improve Outcome: Progressing   Problem: Skin Integrity: Goal: Risk for impaired skin integrity will decrease Outcome: Progressing   Problem: Education: Goal: Individualized Educational Video(s) Outcome: Progressing   Problem: Activity: Goal: Ability to tolerate increased activity will improve Outcome: Progressing   Problem: Cardiac: Goal: Ability to achieve and maintain adequate cardiopulmonary perfusion will improve Outcome: Progressing

## 2021-02-01 NOTE — Care Management Important Message (Signed)
Important Message  Patient Details  Name: Simrin Vegh MRN: [id number] Date of Birth: [birth year]   Medicare Important Message Given:  Yes     Dannette Barbara 02/01/2021, 1:14 PM

## 2021-02-01 NOTE — Progress Notes (Signed)
[facility] Encounter Note  Patient: Carol [name] / Admit Date: 01/29/2021 / Date of Encounter: 02/01/2021, 8:29 AM   Subjective: Patient has had significant improvements in pulmonary edema and shortness of breath and diastolic dysfunction congestive heart failure likely secondary to atrial fibrillation with rapid ventricular rate.  Atrial fibrillation rapid rate slightly improved overnight but still having some need for adjustments dosage combination medication management  Transesophageal echocardiogram shows normal LV systolic function with ejection fraction of 50% with mild mitral regurgitation and no significant other valvular heart disease and/or atrial thrombus  Electrical cardioversion failed to convert patient into normal sinus rhythm despite appropriate loading of amiodarone and heart rate control with other medication management  Discussion with team electrophysiology for further potential treatment options include ablate strategy for inpatient atrial fibrillation ablation strategy.  Long discussion with the team would suggest she would benefit from transfer to tertiary facility for these procedures  Review of Systems: Positive for: Shortness of breath Negative for: Vision change, hearing change, syncope, dizziness, nausea, vomiting,diarrhea, bloody stool, stomach pain, cough, congestion, diaphoresis, urinary frequency, urinary pain,skin lesions, skin rashes Others previously listed  Objective: Telemetry: Atrial fibrillation with rapid ventricular rate Physical Exam: Blood pressure (!) 113/92, pulse (!) 108, temperature 98 F (36.7 C), temperature source Oral, resp. rate 18, height 5\' 6"  (1.676 m), weight 81.4 kg, SpO2 98 %. Body mass index is 28.96 kg/m. General: Well developed, well nourished, in no acute distress. Head: Normocephalic, atraumatic, sclera non-icteric, no xanthomas, nares are without discharge. Neck: No apparent masses Lungs:  Normal respirations with no wheezes, no rhonchi, few rales , no crackles   Heart: Irregular rate and rhythm, normal S1 S2, no murmur, no rub, no gallop, PMI is normal size and placement, carotid upstroke normal without bruit, jugular venous pressure normal Abdomen: Soft, non-tender, non-distended with normoactive bowel sounds. No hepatosplenomegaly. Abdominal aorta is normal size without bruit Extremities: No edema, no clubbing, no cyanosis, no ulcers,  Peripheral: 2+ radial, 2+ femoral, 2+ dorsal pedal pulses Neuro: Alert and oriented. Moves all extremities spontaneously. Psych:  Responds to questions appropriately with a normal affect.   Intake/Output Summary (Last 24 hours) at 02/01/2021 0829 Last data filed at 01/31/2021 1600 Gross per 24 hour  Intake 241 ml  Output 1650 ml  Net -1409 ml    Inpatient Medications:  . amiodarone  300 mg Oral BID  . Chlorhexidine Gluconate Cloth  6 each Topical Daily  . diltiazem  120 mg Oral Daily  . furosemide  20 mg Intravenous BID  . mouth rinse  15 mL Mouth Rinse BID  . metoprolol succinate  100 mg Oral BID  . sodium chloride flush  3 mL Intravenous Q12H  . warfarin  3 mg Oral ONCE-1600  . Warfarin - Pharmacist Dosing Inpatient   Does not apply q1600   Infusions:  . heparin 800 Units/hr (01/31/21 2049)    Labs: Recent Labs    01/31/21 0012 02/01/21 0142  NA 130* 135  K 3.5 3.2*  CL 95* 99  CO2 26 27  GLUCOSE 99 106*  BUN 25* 19  CREATININE 1.04* 0.71  CALCIUM 8.4* 8.1*   Recent Labs    01/29/21 1819  AST 83*  ALT 100*  ALKPHOS 77  BILITOT 1.4*  PROT 6.8  ALBUMIN 3.7   Recent Labs    01/31/21 1009 02/01/21 0142  WBC 7.4 6.7  HGB 13.0 12.5  HCT 38.6 37.1  MCV 93.0 93.5  PLT 262 240  No results for input(s): CKTOTAL, CKMB, TROPONINI in the last 72 hours. Invalid input(s): POCBNP No results for input(s): HGBA1C in the last 72 hours.   Weights: Filed Weights   01/29/21 1808 01/30/21 1738 01/31/21 0827  Weight:  77.1 kg 81.4 kg 81.4 kg     Radiology/Studies:  DG Chest 2 View  Result Date: 01/29/2021 CLINICAL DATA:  Shortness of breath EXAM: CHEST - 2 VIEW COMPARISON:  10/31/2020 FINDINGS: Right-sided central venous port tip over the distal SVC. Cardiomegaly with small bilateral pleural effusions and basilar airspace disease. No pneumothorax. IMPRESSION: Cardiomegaly with small bilateral pleural effusions and basilar airspace disease, atelectasis versus pneumonia. Electronically Signed   By: Donavan Foil M.D.   On: 01/29/2021 19:03   ECHO TEE  Result Date: 01/31/2021    TRANSESOPHOGEAL ECHO REPORT   Patient Name:   Carol [name] Date of Exam: 01/31/2021 Medical Rec #:  [id number]                Height:       66.0 in Accession #:    [id number]               Weight:       179.4 lb Date of Birth:  June 13, 1941                BSA:          1.910 m Patient Age:    80 years                 BP:           114/73 mmHg Patient Gender: F                        HR:           121 bpm. Exam Location:  [facility] Procedure: Transesophageal Echo, Color Doppler, Cardiac Doppler and Saline            Contrast Bubble Study Indications:     Atrial Fibrillation  History:         Patient has no prior history of Echocardiogram examinations.                  Arrythmias:Atrial Fibrillation.  Sonographer:     Sherrie Sport RDCS (AE) Referring Phys:  Harleigh Diagnosing Phys: [name] MD PROCEDURE: The transesophogeal probe was passed without difficulty through the esophogus of the patient. Sedation performed by performing physician. The patient was monitored while under deep sedation. The patient developed no complications during the procedure. IMPRESSIONS  1. Left ventricular ejection fraction, by estimation, is 55 to 60%. The left ventricle has normal function.  2. Right ventricular systolic function is normal. The right ventricular size is normal.  3. No left atrial/left atrial appendage thrombus was detected.  4.  The mitral valve is normal in structure. Mild mitral valve regurgitation.  5. The aortic valve is normal in structure. Aortic valve regurgitation is trivial.  6. Agitated saline contrast bubble study was negative, with no evidence of any interatrial shunt. FINDINGS  Left Ventricle: Left ventricular ejection fraction, by estimation, is 55 to 60%. The left ventricle has normal function. The left ventricular internal cavity size was normal in size. Right Ventricle: The right ventricular size is normal. No increase in right ventricular wall thickness. Right ventricular systolic function is normal. Left Atrium: Left atrial size was normal in size. No left atrial/left atrial appendage thrombus was detected.  Right Atrium: Right atrial size was normal in size. Pericardium: There is no evidence of pericardial effusion. Mitral Valve: The mitral valve is normal in structure. Mild mitral valve regurgitation. Tricuspid Valve: The tricuspid valve is normal in structure. Tricuspid valve regurgitation is trivial. Aortic Valve: The aortic valve is normal in structure. Aortic valve regurgitation is trivial. Pulmonic Valve: The pulmonic valve was normal in structure. Pulmonic valve regurgitation is trivial. Aorta: The aortic root and ascending aorta are structurally normal, with no evidence of dilitation. IAS/Shunts: No atrial level shunt detected by color flow Doppler. Agitated saline contrast was given intravenously to evaluate for intracardiac shunting. Agitated saline contrast bubble study was negative, with no evidence of any interatrial shunt. There  is no evidence of a patent foramen ovale. There is no evidence of an atrial septal defect. [name] MD Electronically signed by [name] MD Signature Date/Time: 01/31/2021/5:20:22 PM    Final      Assessment and Recommendation  81 y.o. female with paroxysmal nonvalvular atrial fibrillation previously well controlled on medication management now with recent  exacerbation unable to control well having congestive heart failure and pulmonary edema due to rapid ventricular rate and now failed electrical cardioversion without evidence of myocardial infarction and now with improved heart rate control with readdition of diltiazem metoprolol combination 1.  Continuation of metoprolol diltiazem combination for heart rate control of atrial fibrillation with goal heart rate control of below 110 bpm at rest.  Additionally we will continue to with amiodarone orally for heart rate control and continued maintenance for the possibility of use of amiodarone for maintenance of normal rhythm after discussion and treatments below 2.  Continue anticoagulation with warfarin for further risk reduction in stroke with atrial fibrillation.  Keeping heparin for now until INR in therapeutic range of greater than 2 3.  Transfer to [facility] electrophysiology for possible pace ablate and or inpatient atrial fibrillation ablation strategy 4.  Patient to be transferred today for strategy listed above.  If any further questions or comments or concerns please call.  Dr. Saralyn Pilar will be covering the weekend Signed, [name] M.D. FACC

## 2021-02-01 NOTE — Anesthesia Postprocedure Evaluation (Signed)
Anesthesia Post Note  Patient: Carol [name]  Procedure(s) Performed: TRANSESOPHAGEAL ECHOCARDIOGRAM (TEE) (N/A ) CARDIOVERSION (N/A )  Patient location during evaluation: PACU Anesthesia Type: General Level of consciousness: awake and alert Pain management: pain level controlled Vital Signs Assessment: post-procedure vital signs reviewed and stable Respiratory status: spontaneous breathing, respiratory function stable and nonlabored ventilation Cardiovascular status: blood pressure returned to baseline and stable Postop Assessment: no apparent nausea or vomiting Anesthetic complications: no   No complications documented.   Last Vitals:  Vitals:   02/01/21 0734 02/01/21 1112  BP: (!) 113/92 100/64  Pulse: (!) 108 93  Resp: 18 18  Temp: 36.7 C (!) 36.4 C  SpO2: 98% 100%    Last Pain:  Vitals:   02/01/21 1112  TempSrc: Oral  PainSc:                  Tera Mater

## 2021-02-01 NOTE — Discharge Summary (Signed)
Physician Discharge Summary  Perlie Stene UGQ:[id number] DOB: 06/02/1941 DOA: 01/29/2021  PCP: Crecencio Mc, MD  Admit date: 01/29/2021 Discharge date: 02/01/2021  Admitted From: home Disposition:  Transfer to [facility]   Recommendations for Outpatient Follow-up:  1. Follow up with PCP in 1-2 weeks  Home Health: none Equipment/Devices: none  Discharge Condition: stable CODE STATUS: Full code Diet recommendation: regular  HPI: Per admitting MD, Carol [name] is a 80 y.o. female with medical history significant of atrial fibrillation, on chronic Coumadin therapy, irritable bowel syndrome, history of lymphoma, fibrocystic breast disease who is scheduled for ablation of her A. fib tomorrow.  She has had also previous follow-up with [facility].  Patient came in due to worsening shortness of breath and worsening lower extremity edema and overall feeling bad.  She was found to be in rapid atrial fibrillation.  She also has significant weakness and appears to have worsening congestive heart failure.  She is being admitted to the hospital for further work-up.  Patient complained of some PND some on orthopnea.  No fever or chills she has some cough.Marland Kitchen  Hospital Course / Discharge diagnoses: Principal Problem A. fib with RVR, paroxysmal A. Fib -INR was subtherapeutic and she was started on heparin drip.  Cardiology has been consulted, underwent a TEE and attempted cardioversion x3 which was unsuccessful.  Has been placed on amiodarone, continue.  Still with RVR at times, remains symptomatic. Dr Nehemiah Massed with cardiology discussed with [facility] and will be transferred to [facility] pending bed availability.   Active Problems Acute hypoxic respiratory failure due to acute pulmonary edema due to acute on chronic systolic CHF - EF 88%.  Continue Lasix, wean off oxygen as tolerated. Hyponatremia-hypervolemic, continue Lasix  Elevated LFTs -mild, possibly related to heart  failure Leukocytosis-reactive, resolved  Sepsis ruled out   Discharge Instructions   Allergies as of 02/01/2021      Reactions   Prednisone Palpitations   A. fib   Pulmicort [budesonide] Shortness Of Breath, Itching, Swelling, Other (See Comments)   Felt as if things were crawly on her   Clonidine Other (See Comments)   Pt felt like things were crawling on her arms.   Lotemax [loteprednol Etabonate]    Broke out around the eye   Morphine And Related Itching, Other (See Comments)   "creepy crawly feeling"   Trovan [alatrofloxacin Mesylate] Other (See Comments)   "effected the whole system"   Zelnorm [tegaserod Maleate] Itching   Felt as if things were crawly on her   Erythromycin Rash      Medication List    STOP taking these medications   albuterol 108 (90 Base) MCG/ACT inhaler Commonly known as: VENTOLIN HFA   benzonatate 200 MG capsule Commonly known as: TESSALON   diltiazem 60 MG tablet Commonly known as: CARDIZEM   doxycycline 100 MG tablet Commonly known as: VIBRA-TABS     TAKE these medications   acetaminophen 500 MG tablet Commonly known as: TYLENOL Take 500 mg by mouth every 6 (six) hours as needed for moderate pain or headache.   amiodarone 200 MG tablet Commonly known as: PACERONE Take 1 tablet (200 mg total) by mouth 2 (two) times daily.   b complex vitamins capsule Take 1 capsule by mouth daily.   bisacodyl 5 MG EC tablet Commonly known as: DULCOLAX Take 5 mg by mouth daily as needed for moderate constipation.   diltiazem 120 MG 24 hr capsule Commonly known as: CARDIZEM CD Take 1 capsule (  120 mg total) by mouth daily.   diphenhydrAMINE 25 mg capsule Commonly known as: BENADRYL Take 25 mg by mouth every 6 (six) hours as needed for allergies.   docusate sodium 100 MG capsule Commonly known as: COLACE Take 100 mg by mouth daily as needed for mild constipation.   Fish Oil 1000 MG Caps Take 2,000 mg by mouth in the morning, at noon, and  at bedtime.   furosemide 20 MG tablet Commonly known as: LASIX TAKE 1 TABLET EVERY DAY   GLUCOSAMINE 1500 COMPLEX PO Take 1,500 mg by mouth daily.   magnesium oxide 400 MG tablet Commonly known as: MAG-OX Take 400 mg by mouth in the morning and at bedtime.   metoprolol succinate 100 MG 24 hr tablet Commonly known as: TOPROL-XL Take 1 tablet (100 mg total) by mouth 2 (two) times daily. Take with or immediately following a meal. What changed:   medication strength  how much to take  when to take this  additional instructions   multivitamin with minerals Tabs tablet Take 0.5 tablets by mouth 2 (two) times daily.   omeprazole 20 MG capsule Commonly known as: PRILOSEC TAKE 1 CAPSULE EVERY DAY What changed: when to take this   REFRESH DRY EYE THERAPY OP Place 1 drop into both eyes 2 (two) times daily.   Vitamin D3 125 MCG (5000 UT) Tabs Take 5,000 Units by mouth daily.   vitamin E 1000 UNIT capsule Take 1,000 Units by mouth 2 (two) times daily.   warfarin 3 MG tablet Commonly known as: COUMADIN Take 1.5-3 mg by mouth See admin instructions. Take 1.5 mg Fridays and Sundays and 3 mg other days of the week. (Takes at night)       Consultations:  Cardiology   Procedures/Studies:  TEE / DCV  DG Chest 2 View  Result Date: 01/29/2021 CLINICAL DATA:  Shortness of breath EXAM: CHEST - 2 VIEW COMPARISON:  10/31/2020 FINDINGS: Right-sided central venous port tip over the distal SVC. Cardiomegaly with small bilateral pleural effusions and basilar airspace disease. No pneumothorax. IMPRESSION: Cardiomegaly with small bilateral pleural effusions and basilar airspace disease, atelectasis versus pneumonia. Electronically Signed   By: Kim  Fujinaga M.D.   On: 01/29/2021 19:03   ECHO TEE  Result Date: 01/31/2021    TRANSESOPHOGEAL ECHO REPORT   Patient Name:   Carol [name] Date of Exam: 01/31/2021 Medical Rec #:  [id number]                Height:       66.0 in  Accession #:    [id number]               Weight:       179.4 lb Date of Birth:  03/16/1941                BSA:          1.910 m Patient Age:    79 years                 BP:           114/73 mmHg Patient Gender: F                        HR:           12 1 bpm. Exam Location:  [facility] Procedure: Transesophageal Echo, Color Doppler, Cardiac Doppler and Saline            Contrast Bubble  Study Indications:     Atrial Fibrillation  History:         Patient has no prior history of Echocardiogram examinations.                  Arrythmias:Atrial Fibrillation.  Sonographer:     Sherrie Sport RDCS (AE) Referring Phys:  [place] Diagnosing Phys: [name] MD PROCEDURE: The transesophogeal probe was passed without difficulty through the esophogus of the patient. Sedation performed by performing physician. The patient was monitored while under deep sedation. The patient developed no complications during the procedure. IMPRESSIONS  1. Left ventricular ejection fraction, by estimation, is 55 to 60%. The left ventricle has normal function.  2. Right ventricular systolic function is normal. The right ventricular size is normal.  3. No left atrial/left atrial appendage thrombus was detected.  4. The mitral valve is normal in structure. Mild mitral valve regurgitation.  5. The aortic valve is normal in structure. Aortic valve regurgitation is trivial.  6. Agitated saline contrast bubble study was negative, with no evidence of any interatrial shunt. FINDINGS  Left Ventricle: Left ventricular ejection fraction, by estimation, is 55 to 60%. The left ventricle has normal function. The left ventricular internal cavity size was normal in size. Right Ventricle: The right ventricular size is normal. No increase in right ventricular wall thickness. Right ventricular systolic function is normal. Left Atrium: Left atrial size was normal in size. No left atrial/left atrial appendage thrombus was detected. Right Atrium: Right atrial  size was normal in size. Pericardium: There is no evidence of pericardial effusion. Mitral Valve: The mitral valve is normal in structure. Mild mitral valve regurgitation. Tricuspid Valve: The tricuspid valve is normal in structure. Tricuspid valve regurgitation is trivial. Aortic Valve: The aortic valve is normal in structure. Aortic valve regurgitation is trivial. Pulmonic Valve: The pulmonic valve was normal in structure. Pulmonic valve regurgitation is trivial. Aorta: The aortic root and ascending aorta are structurally normal, with no evidence of dilitation. IAS/Shunts: No atrial level shunt detected by color flow Doppler. Agitated saline contrast was given intravenously to evaluate for intracardiac shunting. Agitated saline contrast bubble study was negative, with no evidence of any interatrial shunt. There  is no evidence of a patent foramen ovale. There is no evidence of an atrial septal defect. [name] MD Electronically signed by [name] MD Signature Date/Time: 01/31/2021/5:20:22 PM    Final       Subjective: - no chest pain, shortness of breath, no abdominal pain, nausea or vomiting.   Discharge Exam: BP (!) 113/92 (BP Location: Left Arm)   Pulse (!) 108   Temp 98 F (36.7 C) (Oral)   Resp 18   Ht 5\' 6"  (1.676 m)   Wt 81.4 kg   SpO2 98%   BMI 28.96 kg/m   General: Pt is alert, awake, not in acute distress Cardiovascular: RRR, S1/S2 +, no rubs, no gallops Respiratory: CTA bilaterally, no wheezing, no rhonchi Abdominal: Soft, NT, ND, bowel sounds + Extremities: no edema, no cyanosis    The results of significant diagnostics from this hospitalization (including imaging, microbiology, ancillary and laboratory) are listed below for reference.     Microbiology: Recent Results (from the past 240 hour(s))  SARS CORONAVIRUS 2 (TAT 6-24 HRS) Nasopharyngeal Nasopharyngeal Swab     Status: None   Collection Time: 01/26/21 11:53 AM   Specimen: Nasopharyngeal Swab  Result  Value Ref Range Status   SARS Coronavirus 2 NEGATIVE NEGATIVE Final  Comment: (NOTE) SARS-CoV-2 target nucleic acids are NOT DETECTED.  The SARS-CoV-2 RNA is generally detectable in upper and lower respiratory specimens during the acute phase of infection. Negative results do not preclude SARS-CoV-2 infection, do not rule out co-infections with other pathogens, and should not be used as the sole basis for treatment or other patient management decisions. Negative results must be combined with clinical observations, patient history, and epidemiological information. The expected result is Negative.  Fact Sheet for Patients: SugarRoll.be  Fact Sheet for Healthcare Providers: https://www.woods-mathews.com/  This test is not yet approved or cleared by the [place] FDA and  has been authorized for detection and/or diagnosis of SARS-CoV-2 by FDA under an Emergency Use Authorization (EUA). This EUA will remain  in effect (meaning this test can be used) for the duration of the COVID-19 declaration under Se ction 564(b)(1) of the Act, 21 U.S.C. section 360bbb-3(b)(1), unless the authorization is terminated or revoked sooner.  Performed at [facility], [place] [address] [place], [place] [address]   Resp Panel by RT-PCR (Flu A&B, Covid) Nasopharyngeal Swab     Status: None   Collection Time: 01/29/21  7:46 PM   Specimen: Nasopharyngeal Swab; Nasopharyngeal(NP) swabs in vial transport medium  Result Value Ref Range Status   SARS Coronavirus 2 by RT PCR NEGATIVE NEGATIVE Final    Comment: (NOTE) SARS-CoV-2 target nucleic acids are NOT DETECTED.  The SARS-CoV-2 RNA is generally detectable in upper respiratory specimens during the acute phase of infection. The lowest concentration of SARS-CoV-2 viral copies this assay can detect is 138 copies/mL. A negative result does not preclude SARS-Cov-2 infection and should not be used as the sole  basis for treatment or other patient management decisions. A negative result may occur with  improper specimen collection/handling, submission of specimen other than nasopharyngeal swab, presence of viral mutation(s) within the areas targeted by this assay, and inadequate number of viral copies(<138 copies/mL). A negative result must be combined with clinical observations, patient history, and epidemiological information. The expected result is Negative.  Fact Sheet for Patients:  EntrepreneurPulse.com.au  Fact Sheet for Healthcare Providers:  IncredibleEmployment.be  This test is no t yet approved or cleared by the [place] FDA and  has been authorized for detection and/or diagnosis of SARS-CoV-2 by FDA under an Emergency Use Authorization (EUA). This EUA will remain  in effect (meaning this test can be used) for the duration of the COVID-19 declaration under Section 564(b)(1) of the Act, 21 U.S.C.section 360bbb-3(b)(1), unless the authorization is terminated  or revoked sooner.       Influenza A by PCR NEGATIVE NEGATIVE Final   Influenza B by PCR NEGATIVE NEGATIVE Final    Comment: (NOTE) The Xpert Xpress SARS-CoV-2/FLU/RSV plus assay is intended as an aid in the diagnosis of influenza from Nasopharyngeal swab specimens and should not be used as a sole basis for treatment. Nasal washings and aspirates are unacceptable for Xpert Xpress SARS-CoV-2/FLU/RSV testing.  Fact Sheet for Patients: EntrepreneurPulse.com.au  Fact Sheet for Healthcare Providers: IncredibleEmployment.be  This test is not yet approved or cleared by the [place] FDA and has been authorized for detection and/or diagnosis of SARS-CoV-2 by FDA under an Emergency Use Authorization (EUA). This EUA will remain in effect (meaning this test can be used) for the duration of the COVID-19 declaration under Section 564(b)(1) of the Act,  21 U.S.C. section 360bbb-3(b)(1), unless the authorization is terminated or revoked.  Performed at [facility], [address] [place],  [place] [address]      Labs: Basic Metabolic Panel: Recent Labs  Lab 01/29/21 1819 01/30/21 1750 01/31/21 0012 02/01/21 0142  NA 120* 128* 130* 135  K 4.3 3.8 3.5 3.2*  CL 86* 93* 95* 99  CO2 20* 24 26 27   GLUCOSE 155* 96 99 106*  BUN 24* 26* 25* 19  CREATININE 0.93 0.95 1.04* 0.71  CALCIUM 9.3 8.5* 8.4* 8.1*   Liver Function Tests: Recent Labs  Lab 01/29/21 1819  AST 83*  ALT 100*  ALKPHOS 77  BILITOT 1.4*  PROT 6.8  ALBUMIN 3.7   CBC: Recent Labs  Lab 01/29/21 1819 01/30/21 0435 01/30/21 1750 01/31/21 1009 02/01/21 0142  WBC 11.7* 8.8 10.2 7.4 6.7  HGB 14.1 11.6* 13.1 13.0 12.5  HCT 40.6 33.0* 37.9 38.6 37.1  MCV 90.6 90.4 90.7 93.0 93.5  PLT 296 223 274 262 240   CBG: No results for input(s): GLUCAP in the last 168 hours. Hgb A1c No results for input(s): HGBA1C in the last 72 hours. Lipid Profile No results for input(s): CHOL, HDL, LDLCALC, TRIG, CHOLHDL, LDLDIRECT in the last 72 hours. Thyroid function studies No results for input(s): TSH, T4TOTAL, T3FREE, THYROIDAB in the last 72 hours.  Invalid input(s): FREET3 Urinalysis    Component Value Date/Time   COLORURINE YELLOW (A) 01/30/2021 0510   APPEARANCEUR CLEAR (A) 01/30/2021 0510   LABSPEC 1.005 01/30/2021 0510   PHURINE 6.0 01/30/2021 0510   GLUCOSEU NEGATIVE 01/30/2021 0510   HGBUR NEGATIVE 01/30/2021 0510   BILIRUBINUR NEGATIVE 01/30/2021 0510   KETONESUR NEGATIVE 01/30/2021 0510   PROTEINUR NEGATIVE 01/30/2021 0510   NITRITE NEGATIVE 01/30/2021 0510   LEUKOCYTESUR NEGATIVE 01/30/2021 0510    FURTHER DISCHARGE INSTRUCTIONS:   Get Medicines reviewed and adjusted: Please take all your medications with you for your next visit with your Primary MD   Laboratory/radiological data: Please request your Primary MD to go over all hospital  tests and procedure/radiological results at the follow up, please ask your Primary MD to get all Hospital records sent to his/her office.   In some cases, they will be blood work, cultures and biopsy results pending at the time of your discharge. Please request that your primary care M.D. goes through all the records of your hospital data and follows up on these results.   Also Note the following: If you experience worsening of your admission symptoms, develop shortness of breath, life threatening emergency, suicidal or homicidal thoughts you must seek medical attention immediately by calling 911 or calling your MD immediately  if symptoms less severe.   You must read complete instructions/literature along with all the possible adverse reactions/side effects for all the Medicines you take and that have been prescribed to you. Take any new Medicines after you have completely understood and accpet all the possible adverse reactions/side effects.    Do not drive when taking Pain medications or sleeping medications (Benzodaizepines)   Do not take more than prescribed Pain, Sleep and Anxiety Medications. It is not advisable to combine anxiety,sleep and pain medications without talking with your primary care practitioner   Special Instructions: If you have smoked or chewed Tobacco  in the last 2 yrs please stop smoking, stop any regular Alcohol  and or any Recreational drug use.   Wear Seat belts while driving.   Please note: You were cared for by a hospitalist during your hospital stay. Once you are discharged, your primary care physician will handle any further medical issues. Please note that  NO REFILLS for any discharge medications will be authorized once you are discharged, as it is imperative that you return to your primary care physician (or establish a relationship with a primary care physician if you do not have one) for your post hospital discharge needs so that they can reassess your need for  medications and monitor your lab values.  Time coordinating discharge: 35 minutes  SIGNED:  Marzetta Board, MD, PhD 02/01/2021, 8:41 AM

## 2021-02-01 NOTE — Consult Note (Signed)
[place] for heparin infusion / Warfarin Indication: atrial fibrillation  Allergies  Allergen Reactions  . Prednisone Palpitations    A. fib  . Pulmicort [Budesonide] Shortness Of Breath, Itching, Swelling and Other (See Comments)    Felt as if things were crawly on her  . Clonidine Other (See Comments)    Pt felt like things were crawling on her arms.  Amador Cunas [Loteprednol Etabonate]     Broke out around the eye  . Morphine And Related Itching and Other (See Comments)    "creepy crawly feeling"  . Trovan [Alatrofloxacin Mesylate] Other (See Comments)    "effected the whole system"  . Zelnorm [Tegaserod Maleate] Itching    Felt as if things were crawly on her   . Erythromycin Rash    Patient Measurements: Height: 5\' 6"  (167.6 cm) Weight: 81.4 kg (179 lb 7.3 oz) IBW/kg (Calculated) : 59.3 Heparin Dosing Weight: 75 kg   Vital Signs: Temp: 98.6 F (37 C) (03/30 2320) Temp Source: Oral (03/30 2320) BP: 104/87 (03/31 0157) Pulse Rate: 95 (03/31 0157)  Labs: Recent Labs    01/29/21 1819 01/29/21 2135 01/30/21 0435 01/30/21 1357 01/30/21 1750 01/31/21 0012 01/31/21 0800 01/31/21 1009 01/31/21 1805 02/01/21 0142 02/01/21 0249  HGB 14.1  --  11.6*  --  13.1  --   --  13.0  --  12.5  --   HCT 40.6  --  33.0*  --  37.9  --   --  38.6  --  37.1  --   PLT 296  --  223  --  274  --   --  262  --  240  --   APTT 29  --   --   --   --   --   --   --   --   --   --   LABPROT 19.5*  --  21.3*  --   --  20.5*  --   --   --  18.6*  --   INR 1.7*  --  1.9*  --   --  1.8*  --   --   --  1.6*  --   HEPARINUNFRC  --   --  0.58   < >  --  0.64 0.76*  --  0.52 0.40  --   CREATININE 0.93  --   --   --  0.95 1.04*  --   --   --  0.71  --   TROPONINIHS 18* 17  --   --   --   --   --   --   --   --  19*   < > = values in this interval not displayed.    Estimated Creatinine Clearance: 60.3 mL/min (by C-G formula based on SCr of 0.71  mg/dL).   Medical History: Past Medical History:  Diagnosis Date  . A-fib ([place])   . Chronic sinusitis   . Fibrocystic breast disease   . History of pneumonia 1999  . Hyperlipidemia   . Irritable bowel syndrome   . Lichen planus   . 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 Mild tricuspid insufficiency Jan 2012   ECHO, [place]  . Moderate mitral insufficiency JAN 2012   ECHO, [place]    Medications:  Warfarin PTA: Warfarin 1.5 mg on Sunday and Fri and 3 mg all other days INR goal: 2-3   Assessment: 80 y.o. female with  history of A. fib, on chronic coumadin, status post ablation presented with increasing SOB and planned cardioversion. Pharmacy has been consulted for heparin given subtherapeutic INR 1.7 on admission.  DDI with warfarin: amiodarone, which can increase INR (home med). Underwent TEE and cardioversion 3/30.  Date INR Warfarin 3/28 1.7 -- 3/29 1.9 1.5 mg 3/30 0.76 3 mg 3/31     1.6       3 mg   3/30 0012 HL 0.64 3/30 0800 HL 0.76  3/30 1805 HL 0.52 3/31 0142 HL 0.4, therapeutic X 2    Goal of Therapy:  Heparin level 0.3-0.7 units/ml Monitor platelets by anticoagulation protocol: Yes   Plan:  Heparin: 3/31:  HL @ 0142 = 0.4, therapeutic X 2  Will continue pt on current rate and recheck HL on 04/01 with AM labs.   Warfarin: INR is subtherapeutic. Warfarin 3 mg x 1 given (home dose). Daily INR ordered.   Shatori Bertucci D, PharmD 02/01/2021 3:23 AM

## 2021-02-02 DIAGNOSIS — J449 Chronic obstructive pulmonary disease, unspecified: Secondary | ICD-10-CM | POA: Diagnosis present

## 2021-02-02 DIAGNOSIS — I110 Hypertensive heart disease with heart failure: Secondary | ICD-10-CM | POA: Diagnosis present

## 2021-02-02 DIAGNOSIS — Z9189 Other specified personal risk factors, not elsewhere classified: Secondary | ICD-10-CM | POA: Diagnosis not present

## 2021-02-02 DIAGNOSIS — J329 Chronic sinusitis, unspecified: Secondary | ICD-10-CM | POA: Diagnosis present

## 2021-02-02 DIAGNOSIS — J90 Pleural effusion, not elsewhere classified: Secondary | ICD-10-CM | POA: Diagnosis not present

## 2021-02-02 DIAGNOSIS — Z885 Allergy status to narcotic agent status: Secondary | ICD-10-CM | POA: Diagnosis not present

## 2021-02-02 DIAGNOSIS — I420 Dilated cardiomyopathy: Secondary | ICD-10-CM | POA: Diagnosis present

## 2021-02-02 DIAGNOSIS — E782 Mixed hyperlipidemia: Secondary | ICD-10-CM | POA: Diagnosis present

## 2021-02-02 DIAGNOSIS — Z90722 Acquired absence of ovaries, bilateral: Secondary | ICD-10-CM | POA: Diagnosis not present

## 2021-02-02 DIAGNOSIS — Z79899 Other long term (current) drug therapy: Secondary | ICD-10-CM | POA: Diagnosis not present

## 2021-02-02 DIAGNOSIS — K589 Irritable bowel syndrome without diarrhea: Secondary | ICD-10-CM | POA: Diagnosis present

## 2021-02-02 DIAGNOSIS — C8599 Non-Hodgkin lymphoma, unspecified, extranodal and solid organ sites: Secondary | ICD-10-CM | POA: Diagnosis not present

## 2021-02-02 DIAGNOSIS — K760 Fatty (change of) liver, not elsewhere classified: Secondary | ICD-10-CM | POA: Diagnosis not present

## 2021-02-02 DIAGNOSIS — C50919 Malignant neoplasm of unspecified site of unspecified female breast: Secondary | ICD-10-CM | POA: Diagnosis not present

## 2021-02-02 DIAGNOSIS — J9811 Atelectasis: Secondary | ICD-10-CM | POA: Diagnosis not present

## 2021-02-02 DIAGNOSIS — E876 Hypokalemia: Secondary | ICD-10-CM | POA: Diagnosis present

## 2021-02-02 DIAGNOSIS — I4891 Unspecified atrial fibrillation: Secondary | ICD-10-CM | POA: Diagnosis not present

## 2021-02-02 DIAGNOSIS — Z9221 Personal history of antineoplastic chemotherapy: Secondary | ICD-10-CM | POA: Diagnosis not present

## 2021-02-02 DIAGNOSIS — I4819 Other persistent atrial fibrillation: Secondary | ICD-10-CM | POA: Diagnosis not present

## 2021-02-02 DIAGNOSIS — Z8572 Personal history of non-Hodgkin lymphomas: Secondary | ICD-10-CM | POA: Diagnosis not present

## 2021-02-02 DIAGNOSIS — K219 Gastro-esophageal reflux disease without esophagitis: Secondary | ICD-10-CM | POA: Diagnosis present

## 2021-02-02 DIAGNOSIS — I482 Chronic atrial fibrillation, unspecified: Secondary | ICD-10-CM | POA: Diagnosis not present

## 2021-02-02 DIAGNOSIS — Z85840 Personal history of malignant neoplasm of eye: Secondary | ICD-10-CM | POA: Diagnosis not present

## 2021-02-02 DIAGNOSIS — Z7722 Contact with and (suspected) exposure to environmental tobacco smoke (acute) (chronic): Secondary | ICD-10-CM | POA: Diagnosis present

## 2021-02-02 DIAGNOSIS — I340 Nonrheumatic mitral (valve) insufficiency: Secondary | ICD-10-CM | POA: Diagnosis present

## 2021-02-02 DIAGNOSIS — E041 Nontoxic single thyroid nodule: Secondary | ICD-10-CM | POA: Diagnosis not present

## 2021-02-02 DIAGNOSIS — I471 Supraventricular tachycardia: Secondary | ICD-10-CM | POA: Diagnosis present

## 2021-02-02 DIAGNOSIS — Z90710 Acquired absence of both cervix and uterus: Secondary | ICD-10-CM | POA: Diagnosis not present

## 2021-02-02 DIAGNOSIS — K5900 Constipation, unspecified: Secondary | ICD-10-CM | POA: Diagnosis present

## 2021-02-02 DIAGNOSIS — I5033 Acute on chronic diastolic (congestive) heart failure: Secondary | ICD-10-CM | POA: Diagnosis present

## 2021-02-02 DIAGNOSIS — I10 Essential (primary) hypertension: Secondary | ICD-10-CM | POA: Diagnosis not present

## 2021-02-02 DIAGNOSIS — Z79810 Long term (current) use of selective estrogen receptor modulators (SERMs): Secondary | ICD-10-CM | POA: Diagnosis not present

## 2021-02-02 DIAGNOSIS — J410 Simple chronic bronchitis: Secondary | ICD-10-CM | POA: Diagnosis not present

## 2021-02-02 DIAGNOSIS — I480 Paroxysmal atrial fibrillation: Secondary | ICD-10-CM | POA: Diagnosis not present

## 2021-02-02 DIAGNOSIS — I509 Heart failure, unspecified: Secondary | ICD-10-CM | POA: Diagnosis not present

## 2021-02-02 DIAGNOSIS — J811 Chronic pulmonary edema: Secondary | ICD-10-CM | POA: Diagnosis not present

## 2021-02-02 DIAGNOSIS — I4892 Unspecified atrial flutter: Secondary | ICD-10-CM | POA: Diagnosis present

## 2021-02-12 ENCOUNTER — Telehealth: Payer: Self-pay

## 2021-02-12 DIAGNOSIS — R791 Abnormal coagulation profile: Secondary | ICD-10-CM | POA: Diagnosis not present

## 2021-02-12 MED ORDER — OMEPRAZOLE 20 MG PO CPDR: 1.0000 | DELAYED_RELEASE_CAPSULE | Freq: Every day | ORAL | 0 refills | Status: DC

## 2021-02-12 MED ORDER — FUROSEMIDE 20 MG PO TABS: 20.0000 mg | ORAL_TABLET | Freq: Every day | ORAL | 0 refills | Status: DC

## 2021-02-12 NOTE — Telephone Encounter (Signed)
Patient to hospital follow up with Cardiology. No transition of care scheduled with pcp at tis time. Requests refill for furosemide and omeprazole. Sent to pharmacy on file. Next schedule OV 04/2021.

## 2021-02-12 NOTE — Addendum Note (Signed)
Addended byElpidio Galea T on: 02/12/2021 05:04 PM   Modules accepted: Orders

## 2021-02-12 NOTE — Telephone Encounter (Signed)
Pt needed these to [facility]

## 2021-02-12 NOTE — Telephone Encounter (Signed)
Refill sent to mail in pharmacy per patient request when called to verify hfu scheduled with Cardiology.

## 2021-02-20 NOTE — Telephone Encounter (Signed)
Pt called back and wanted to know why she still hasn't received her medication from [place]. I told her they were sent in on 4/11 and [facility] confirmed [facility] for refills, that she would need to call [facility] to see what the hold up was on her receiving her mediation and if they had her correct information Pt said that she shouldn't have to call the Pharmacy that's what we are for.  She stated "that she was getting tired of our office" and hung up   Rx refills  omeprazole (PRILOSEC) 20 MG capsule furosemide (LASIX) 20 MG tablet  This is patient first time using [facility] mail order

## 2021-02-21 MED ORDER — OMEPRAZOLE 20 MG PO CPDR: 1.0000 | DELAYED_RELEASE_CAPSULE | Freq: Every day | ORAL | 1 refills | Status: DC

## 2021-02-21 MED ORDER — FUROSEMIDE 20 MG PO TABS: 20.0000 mg | ORAL_TABLET | Freq: Every day | ORAL | 0 refills | Status: DC

## 2021-02-21 NOTE — Telephone Encounter (Signed)
Spoke with pt and she stated that she spoke with [facility] yesterday and they are shipping out her medication today.

## 2021-02-21 NOTE — Addendum Note (Signed)
Addended by: Adair Laundry on: 02/21/2021 10:20 AM   Modules accepted: Orders

## 2021-02-22 DIAGNOSIS — R791 Abnormal coagulation profile: Secondary | ICD-10-CM | POA: Diagnosis not present

## 2021-03-15 DIAGNOSIS — E782 Mixed hyperlipidemia: Secondary | ICD-10-CM | POA: Diagnosis not present

## 2021-03-15 DIAGNOSIS — I10 Essential (primary) hypertension: Secondary | ICD-10-CM | POA: Diagnosis not present

## 2021-03-15 DIAGNOSIS — I480 Paroxysmal atrial fibrillation: Secondary | ICD-10-CM | POA: Diagnosis not present

## 2021-03-20 ENCOUNTER — Telehealth: Payer: Self-pay | Admitting: Internal Medicine

## 2021-03-20 DIAGNOSIS — R0789 Other chest pain: Secondary | ICD-10-CM | POA: Diagnosis not present

## 2021-03-20 DIAGNOSIS — I480 Paroxysmal atrial fibrillation: Secondary | ICD-10-CM | POA: Diagnosis not present

## 2021-03-20 DIAGNOSIS — R079 Chest pain, unspecified: Secondary | ICD-10-CM | POA: Diagnosis not present

## 2021-03-20 NOTE — Telephone Encounter (Signed)
Patient called in complaining about sharp pain at lower shoulder move from there to under breast. Patient was transferred to [facility]. [facility] was given the information and then patient transferred.

## 2021-03-20 NOTE — Telephone Encounter (Signed)
Patient stated she will talk to her cardiologist and denied going to ED.    [facility] Patient Name: Carol [name] Gender: Female DOB: 1941-06-23 Age: 80 Y 65 M 16 D Return Phone Number: [phone] (Primary), [phone] (Secondary) Address: City/ State/ Zip: [place]  [address] Client Carol [name] - [facility] Client Site [name] Physician [name] - MD Contact Type Call Who Is Calling Patient / Member / Family / Caregiver Call Type Triage / Clinical Caller Name [name] Relationship To Patient Other Return Phone Number [phone] (Primary) Chief Complaint Back Pain - General Reason for Call Symptomatic / Request for [name] states pt is complaining of pain below her right shoulder blade and radiates down into the front of her breast. Caller stats the office does not have available appts for today. Translation No Nurse Assessment Nurse: Harlow Mares, RN, Suanne Marker Date/Time Eilene Ghazi Time): 03/20/2021 1:41:56 PM Confirm and document reason for call. If symptomatic, describe symptoms. ---Caller states pt is complaining of pain below her right shoulder blade and radiates down into the front of her breast. Caller states the office does not have available appts for today. Reports that she had an ablation on the left side about 3 weeks ago. Does the patient have any new or worsening symptoms? ---Yes Will a triage be completed? ---Yes Related visit to physician within the last 2 weeks? ---No Does the PT have any chronic conditions? (i.e. diabetes, asthma, this includes High risk factors for pregnancy, etc.) ---Yes List chronic conditions. ---A-fib; (takes blood thinner); HTN; Is this a behavioral health or substance abuse call? ---No Guidelines Guideline Title Affirmed Question Affirmed Notes  Nurse Date/Time (Eastern Time) Shoulder Pain [1] Pain lasting > 5 minutes AND [2] pain also present in [place], [place] 03/20/2021 1:44:36 PM PLEASE NOTE: All timestamps contained within this report are represented as [place] Standard Time. CONFIDENTIALTY NOTICE: This fax transmission is intended only for the addressee. It contains information that is legally privileged, confidential or otherwise protected from use or disclosure. If you are not the intended recipient, you are strictly prohibited from reviewing, disclosing, copying using or disseminating any of this information or taking any action in reliance on or regarding this information. If you have received this fax in error, please notify us immediately by telephone so that we can arrange for its return to [place]. Phone: [phone], Toll-Free: [phone], Fax: [phone] Page: 2 of 2 Call Id: [id number] Guidelines Guideline Title Affirmed Question Affirmed Notes Nurse Date/Time Eilene Ghazi Time) chest (Exception: pain is clearly made worse by movement) Disp. Time Eilene Ghazi Time) Disposition Final User 03/20/2021 1:47:25 PM Go to ED Now Yes Harlow Mares, RN, Rosalyn Charters Disagree/Comply Disagree Caller Understands Yes PreDisposition InappropriateToAsk Care Advice Given Per Guideline GO TO ED NOW: * You need to be seen in the Emergency Department. * Go to the ED at ___________ [place] now. Drive carefully. NOTE TO TRIAGER - DRIVING: * Another adult should drive. * Patient should not delay going to the emergency department. * If immediate transportation is not available via car, rideshare (e.g., Lyft, Uber), or taxi, then the patient should be instructed to call [id number]. CALL EMS IF: * The patient passes out, starts acting confused or becomes too weak to stand. BRING MEDICINES: * Bring a list of your current medicines when you go to the Emergency Department (ER). * Bring the pill bottles too.  This will help the doctor (or NP/PA)  to make certain you are taking the right medicines and the right dose. CARE ADVICE given per Shoulder Pain (Adult) guideline Comments User: [name], RN Date/Time (Eastern Time): 03/20/2021 1:47:20 PM Caller refuses to to go the ED and states that she wishes to consult with Dr. [name] (her heart MD) first. Referrals GO TO FACILITY REFUSED

## 2021-03-21 ENCOUNTER — Telehealth: Payer: Self-pay | Admitting: Internal Medicine

## 2021-03-21 DIAGNOSIS — B0223 Postherpetic polyneuropathy: Secondary | ICD-10-CM | POA: Insufficient documentation

## 2021-03-21 DIAGNOSIS — B029 Zoster without complications: Secondary | ICD-10-CM | POA: Insufficient documentation

## 2021-03-21 MED ORDER — TRAMADOL HCL 50 MG PO TABS: 50.0000 mg | ORAL_TABLET | Freq: Three times a day (TID) | ORAL | 0 refills | Status: AC | PRN

## 2021-03-21 MED ORDER — GABAPENTIN 100 MG PO CAPS: 100.0000 mg | ORAL_CAPSULE | Freq: Three times a day (TID) | ORAL | 3 refills | Status: DC

## 2021-03-21 NOTE — Telephone Encounter (Signed)
Patient saw her cardiologist yesterday for rt shoulder pain to under her breast. Her cardiologist said it was shingles , he prescribed valacyclovir. He told her if she needed anything for pain to call her provider. She would like some medication for her pain.

## 2021-03-21 NOTE — Telephone Encounter (Signed)
I have sent two prescriptions to her pharmacy:  1) gabapentin:  For shingles pain:  100 mg every 8 hours. Dose can be increased gradually if needed. .  Higher doses MIGHT CAUSE DIZZINESS  2) tramadol;  50 mg every 8 hours..  Can be combined with gabapentin if needed for pain relief.

## 2021-03-21 NOTE — Telephone Encounter (Signed)
Patient has been informed.

## 2021-03-26 DIAGNOSIS — Z7901 Long term (current) use of anticoagulants: Secondary | ICD-10-CM | POA: Diagnosis not present

## 2021-03-29 MED ORDER — HYDROXYZINE PAMOATE 25 MG PO CAPS: 25.0000 mg | ORAL_CAPSULE | Freq: Three times a day (TID) | ORAL | 0 refills | Status: DC | PRN

## 2021-03-29 NOTE — Telephone Encounter (Signed)
Pt stated that she is still in pain with the shingles and is now itching really bad Pt wanted to know if something else could be called in

## 2021-03-29 NOTE — Telephone Encounter (Signed)
LMTCB

## 2021-03-29 NOTE — Telephone Encounter (Signed)
Has she increased the gabapentin dose to 200 mg three times daily? If not,  She can for the pain    hydroxyzine sent in for the itchig

## 2021-03-30 NOTE — Telephone Encounter (Signed)
PT called to return missed call

## 2021-03-30 NOTE — Telephone Encounter (Signed)
Spoke with pt to let her know that the Gabapentin can be increased to 200 mg three times daily and that Dr. Derrel Nip sent in a rx for hydroxyzine for the itching. The pt gave a verbal understanding and stated that she took a hydroxyzine last night and it really helped with the itching.

## 2021-04-11 ENCOUNTER — Encounter: Payer: Self-pay | Admitting: Internal Medicine

## 2021-04-11 ENCOUNTER — Other Ambulatory Visit: Payer: Self-pay

## 2021-04-11 ENCOUNTER — Ambulatory Visit (INDEPENDENT_AMBULATORY_CARE_PROVIDER_SITE_OTHER): Payer: Medicare Other | Admitting: Internal Medicine

## 2021-04-11 VITALS — BP 140/82 | HR 45 | Resp 99 | Ht 66.0 in | Wt 166.0 lb

## 2021-04-11 DIAGNOSIS — C8591 Non-Hodgkin lymphoma, unspecified, lymph nodes of head, face, and neck: Secondary | ICD-10-CM

## 2021-04-11 DIAGNOSIS — B0223 Postherpetic polyneuropathy: Secondary | ICD-10-CM | POA: Diagnosis not present

## 2021-04-11 DIAGNOSIS — D6869 Other thrombophilia: Secondary | ICD-10-CM

## 2021-04-11 DIAGNOSIS — E7800 Pure hypercholesterolemia, unspecified: Secondary | ICD-10-CM | POA: Diagnosis not present

## 2021-04-11 DIAGNOSIS — K760 Fatty (change of) liver, not elsewhere classified: Secondary | ICD-10-CM | POA: Diagnosis not present

## 2021-04-11 DIAGNOSIS — I4811 Longstanding persistent atrial fibrillation: Secondary | ICD-10-CM

## 2021-04-11 LAB — COMPREHENSIVE METABOLIC PANEL
ALT: 25 U/L (ref 0–35)
AST: 29 U/L (ref 0–37)
Albumin: 4.1 g/dL (ref 3.5–5.2)
Alkaline Phosphatase: 54 U/L (ref 39–117)
BUN: 16 mg/dL (ref 6–23)
CO2: 30 mEq/L (ref 19–32)
Calcium: 9.4 mg/dL (ref 8.4–10.5)
Chloride: 100 mEq/L (ref 96–112)
Creatinine, Ser: 0.78 mg/dL (ref 0.40–1.20)
GFR: 71.64 mL/min (ref >60.00)
Glucose, Bld: 85 mg/dL (ref 70–99)
Potassium: 3.9 mEq/L (ref 3.5–5.1)
Sodium: 137 mEq/L (ref 135–145)
Total Bilirubin: 0.7 mg/dL (ref 0.2–1.2)
Total Protein: 6.6 g/dL (ref 6.0–8.3)

## 2021-04-11 LAB — LIPID PANEL
Cholesterol: 223 mg/dL — ABNORMAL HIGH (ref 0–200)
HDL: 62.2 mg/dL (ref >39.00)
LDL Cholesterol: 143 mg/dL — ABNORMAL HIGH (ref 0–99)
NonHDL: 161.28
Total CHOL/HDL Ratio: 4
Triglycerides: 91 mg/dL (ref 0.0–149.0)
VLDL: 18.2 mg/dL (ref 0.0–40.0)

## 2021-04-11 MED ORDER — GABAPENTIN 100 MG PO CAPS: 200.0000 mg | ORAL_CAPSULE | Freq: Three times a day (TID) | ORAL | 1 refills | Status: DC

## 2021-04-11 NOTE — Patient Instructions (Signed)
Please start the rosuvastatin 5 mg twice WEEKLY  To see if you can tolerate it .  There is STILL some benefit to reduced dosing for placque stabilization .  I recommend getting the shingles vaccine next month

## 2021-04-11 NOTE — Progress Notes (Signed)
Subjective:  Patient ID: Carol [name], female    DOB: 09-Nov-1940  Age: 80 y.o. MRN: [id number]  CC: The primary encounter diagnosis was Hepatic steatosis. Diagnoses of Pure hypercholesterolemia, Acquired thrombophilia ([place]), Shingles (herpes zoster) polyneuropathy, Lymphoma of lymph nodes of head, face, and/or neck ([place]), and Longstanding persistent atrial fibrillation ([place]) were also pertinent to this visit.  HPI Carol [name] presents for follow up  This visit occurred during the SARS-CoV-2 public health emergency.  Safety protocols were in place, including screening questions prior to the visit, additional usage of staff PPE, and extensive cleaning of exam room while observing appropriate contact time as indicated for disinfecting solutions.   She is recovering from a Shingles episode mid may  that involved her right chest wall and back .  She was treated by cardiology with acyclovir .  The area is still very painful and itchy using gabapentin 200 mg tid and hyrdroxyzine prn itching , which has been very sedating.  She had not received the shingrix vaccine.    Postponed colonoscopy  Carol [name] managing a fib with amiodarone .  Initial presentation caused respiratory failure. Was admitted to [facility] for ablation followig unsuccessful  cardioversions x 4    Now controlled wit amio and metoprolol  Taking warfarin.  Carol [name] felt that the a fib occurred as a result of the COVID Vaccine ; she has had the booster but worried about getting the 2nd booster  Had a soft fall on a sand dune while vacationing, no injuries or bruising.  Taking coumadin ,  No bleeding issues   Took ibuprofen recently one time due to joint pain   Not taking a statin daily due to statin myalgias. (Trial of rosuvastatin 5 mg daily ) Has not tried it 2 times weekly   Outpatient Medications Prior to Visit  Medication Sig Dispense Refill   acetaminophen (TYLENOL) 500 MG tablet Take 500 mg by mouth every 6  (six) hours as needed for moderate pain or headache.     amiodarone (PACERONE) 200 MG tablet Take 1 tablet (200 mg total) by mouth 2 (two) times daily.     b complex vitamins capsule Take 1 capsule by mouth daily.     bisacodyl (DULCOLAX) 5 MG EC tablet Take 5 mg by mouth daily as needed for moderate constipation.     Cholecalciferol (VITAMIN D3) 125 MCG (5000 UT) TABS Take 5,000 Units by mouth daily.     diltiazem (CARDIZEM CD) 120 MG 24 hr capsule Take 1 capsule (120 mg total) by mouth daily.     diphenhydrAMINE (BENADRYL) 25 mg capsule Take 25 mg by mouth every 6 (six) hours as needed for allergies.     docusate sodium (COLACE) 100 MG capsule Take 100 mg by mouth daily as needed for mild constipation.     furosemide (LASIX) 20 MG tablet Take 1 tablet (20 mg total) by mouth daily. 90 tablet 0   Glucosamine-Chondroit-Vit C-Mn (GLUCOSAMINE 1500 COMPLEX PO) Take 1,500 mg by mouth daily.     Glycerin-Polysorbate 80 (REFRESH DRY EYE THERAPY OP) Place 1 drop into both eyes 2 (two) times daily.     hydrOXYzine (VISTARIL) 25 MG capsule Take 1 capsule (25 mg total) by mouth every 8 (eight) hours as needed. 30 capsule 0   magnesium oxide (MAG-OX) 400 MG tablet Take 400 mg by mouth in the morning and at bedtime.     metoprolol succinate (TOPROL-XL) 100 MG 24 hr tablet Take 1 tablet (100 mg total)  by mouth 2 (two) times daily. Take with or immediately following a meal.     Multiple Vitamin (MULTIVITAMIN WITH MINERALS) TABS tablet Take 0.5 tablets by mouth 2 (two) times daily.     Omega-3 Fatty Acids (FISH OIL) 1000 MG CAPS Take 2,000 mg by mouth in the morning, at noon, and at bedtime.     omeprazole (PRILOSEC) 20 MG capsule Take 1 capsule (20 mg total) by mouth daily. 90 capsule 1   vitamin E 1000 UNIT capsule Take 1,000 Units by mouth 2 (two) times daily.     warfarin (COUMADIN) 3 MG tablet Take 1.5-3 mg by mouth See admin instructions. Take 1.5 mg Fridays and Sundays and 3 mg other days of the week.  (Takes at night)     gabapentin (NEURONTIN) 100 MG capsule Take 1 capsule (100 mg total) by mouth 3 (three) times daily. 90 capsule 3   No facility-administered medications prior to visit.    Review of Systems;  Patient denies headache, fevers, malaise, unintentional weight loss, skin rash, eye pain, sinus congestion and sinus pain, sore throat, dysphagia,  hemoptysis , cough, dyspnea, wheezing, chest pain, palpitations, orthopnea, edema, abdominal pain, nausea, melena, diarrhea, constipation, flank pain, dysuria, hematuria, urinary  Frequency, nocturia, numbness, tingling, seizures,  Focal weakness, Loss of consciousness,  Tremor, insomnia, depression, anxiety, and suicidal ideation.      Objective:  BP 140/82 (BP Location: Left Arm, Patient Position: Sitting, Cuff Size: Normal)   Pulse (!) 45   Resp (!) 99   Ht 5\' 6"  (1.676 m)   Wt 166 lb (75.3 kg)   SpO2 99%   BMI 26.79 kg/m   BP Readings from Last 3 Encounters:  04/11/21 140/82  02/02/21 100/81  01/17/21 (!) 114/97    Wt Readings from Last 3 Encounters:  04/11/21 166 lb (75.3 kg)  02/01/21 178 lb 5.6 oz (80.9 kg)  01/17/21 164 lb (74.4 kg)    General appearance: alert, cooperative and appears stated age Ears: normal TM's and external ear canals both ears Throat: lips, mucosa, and tongue normal; teeth and gums normal Neck: no adenopathy, no carotid bruit, supple, symmetrical, trachea midline and thyroid not enlarged, symmetric, no tenderness/mass/nodules Back: symmetric, no curvature. ROM normal. No CVA tenderness. Lungs: clear to auscultation bilaterally Heart: regular rate and rhythm, S1, S2 normal, no murmur, click, rub or gallop Abdomen: soft, non-tender; bowel sounds normal; no masses,  no organomegaly Pulses: 2+ and symmetric Skin: Skin color, texture, turgor normal. No rashes or lesions Lymph nodes: Cervical, supraclavicular, and axillary nodes normal.  Lab Results  Component Value Date   HGBA1C 5.7  05/27/2016    Lab Results  Component Value Date   CREATININE 0.78 04/11/2021   CREATININE 0.71 02/01/2021   CREATININE 1.04 (H) 01/31/2021    Lab Results  Component Value Date   WBC 6.7 02/01/2021   HGB 12.5 02/01/2021   HCT 37.1 02/01/2021   PLT 240 02/01/2021   GLUCOSE 85 04/11/2021   CHOL 223 (H) 04/11/2021   TRIG 91.0 04/11/2021   HDL 62.20 04/11/2021   LDLDIRECT 156.0 04/05/2020   LDLCALC 143 (H) 04/11/2021   ALT 25 04/11/2021   AST 29 04/11/2021   NA 137 04/11/2021   K 3.9 04/11/2021   CL 100 04/11/2021   CREATININE 0.78 04/11/2021   BUN 16 04/11/2021   CO2 30 04/11/2021   TSH 1.85 08/25/2018   INR 1.6 (H) 02/01/2021   HGBA1C 5.7 05/27/2016    ECHO TEE  Result  Date: 01/31/2021    TRANSESOPHOGEAL ECHO REPORT   Patient Name:   KAIULANI SITTON Date of Exam: 01/31/2021 Medical Rec #:  [id number]                Height:       66.0 in Accession #:    [id number]               Weight:       179.4 lb Date of Birth:  [birth year]                BSA:          1.910 m Patient Age:    36 years                 BP:           114/73 mmHg Patient Gender: F                        HR:           121 bpm. Exam Location:  [facility] Procedure: Transesophageal Echo, Color Doppler, Cardiac Doppler and Saline            Contrast Bubble Study Indications:     Atrial Fibrillation  History:         Patient has no prior history of Echocardiogram examinations.                  Arrythmias:Atrial Fibrillation.  Sonographer:     Sherrie Sport RDCS (AE) Referring Phys:  [place] Diagnosing Phys: [name] MD PROCEDURE: The transesophogeal probe was passed without difficulty through the esophogus of the patient. Sedation performed by performing physician. The patient was monitored while under deep sedation. The patient developed no complications during the procedure. IMPRESSIONS  1. Left ventricular ejection fraction, by estimation, is 55 to 60%. The left ventricle has normal function.  2.  Right ventricular systolic function is normal. The right ventricular size is normal.  3. No left atrial/left atrial appendage thrombus was detected.  4. The mitral valve is normal in structure. Mild mitral valve regurgitation.  5. The aortic valve is normal in structure. Aortic valve regurgitation is trivial.  6. Agitated saline contrast bubble study was negative, with no evidence of any interatrial shunt. FINDINGS  Left Ventricle: Left ventricular ejection fraction, by estimation, is 55 to 60%. The left ventricle has normal function. The left ventricular internal cavity size was normal in size. Right Ventricle: The right ventricular size is normal. No increase in right ventricular wall thickness. Right ventricular systolic function is normal. Left Atrium: Left atrial size was normal in size. No left atrial/left atrial appendage thrombus was detected. Right Atrium: Right atrial size was normal in size. Pericardium: There is no evidence of pericardial effusion. Mitral Valve: The mitral valve is normal in structure. Mild mitral valve regurgitation. Tricuspid Valve: The tricuspid valve is normal in structure. Tricuspid valve regurgitation is trivial. Aortic Valve: The aortic valve is normal in structure. Aortic valve regurgitation is trivial. Pulmonic Valve: The pulmonic valve was normal in structure. Pulmonic valve regurgitation is trivial. Aorta: The aortic root and ascending aorta are structurally normal, with no evidence of dilitation. IAS/Shunts: No atrial level shunt detected by color flow Doppler. Agitated saline contrast was given intravenously to evaluate for intracardiac shunting. Agitated saline contrast bubble study was negative, with no evidence of any interatrial shunt. There  is no evidence of a patent foramen  ovale. There is no evidence of an atrial septal defect. [name] MD Electronically signed by [name] MD Signature Date/Time: 01/31/2021/5:20:22 PM    Final     Assessment & Plan:    Problem List Items Addressed This Visit       Unprioritized   Hyperlipidemia    Untreated due to statin myalgias with 5 mg daily rosuvastatin dose.  encouarged to resume at twice weekly dose   Lab Results  Component Value Date   CHOL 223 (H) 04/11/2021   HDL 62.20 04/11/2021   LDLCALC 143 (H) 04/11/2021   LDLDIRECT 156.0 04/05/2020   TRIG 91.0 04/11/2021   CHOLHDL 4 04/11/2021          Atrial fibrillation (HCC)    S/p ablation  , now managed with metoprolol and amiodarone.warfarin for anticoagulation.  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  She maintains periodic surveillance with her oncologist        Relevant Medications   gabapentin (NEURONTIN) 100 MG capsule   Acquired thrombophilia ([place])    Secondary to atrial fibrillation, chronic.  She is anticoagulated with warfarin . CBC is normal   Lab Results  Component Value Date   WBC 6.7 02/01/2021   HGB 12.5 02/01/2021   HCT 37.1 02/01/2021   MCV 93.5 02/01/2021   PLT 240 02/01/2021          Hepatic steatosis - Primary   Relevant Orders   Lipid panel (Completed)   Comprehensive metabolic panel (Completed)   Shingles (herpes zoster) polyneuropathy    Continued pain and itching in the right chest wall and back from May 18 outbreak.  Continue gabapentin and hydroxyzine       Relevant Medications   gabapentin (NEURONTIN) 100 MG capsule   I spent 30 mintutes dedicated to the care of this patient on the date of this encounter to include pre-visit review of his medical history,  Face-to-face time with the patient , and post visit ordering of testing and therapeutics.   I have changed Carol Councilman. [name]'s gabapentin. I am also having her maintain her diphenhydrAMINE, magnesium oxide, vitamin E, Fish  Oil, multivitamin with minerals, Vitamin D3, bisacodyl, Glycerin-Polysorbate 80 (REFRESH DRY EYE THERAPY OP), amiodarone, docusate sodium, Glucosamine-Chondroit-Vit C-Mn (GLUCOSAMINE 1500 COMPLEX PO), warfarin, b complex vitamins, acetaminophen, diltiazem, metoprolol succinate, omeprazole, furosemide, and hydrOXYzine.  Meds ordered this encounter  Medications   gabapentin (NEURONTIN) 100 MG capsule    Sig: Take 2 capsules (200 mg total) by mouth 3 (three) times daily.    Dispense:  540 capsule    Refill:  1    Medications Discontinued During This Encounter  Medication Reason   gabapentin (NEURONTIN) 100 MG capsule     Follow-up: Return in about 6 months (around 10/11/2021).   Crecencio Mc, MD

## 2021-04-13 NOTE — Assessment & Plan Note (Signed)
Continued pain and itching in the right chest wall and back from May 18 outbreak.  Continue gabapentin and hydroxyzine

## 2021-04-13 NOTE — Assessment & Plan Note (Addendum)
Low grade B cell,  Now in remission.  She maintains periodic surveillance with her oncologist

## 2021-04-13 NOTE — Assessment & Plan Note (Signed)
S/p ablation  , now managed with metoprolol and amiodarone.warfarin for anticoagulation.  currently rate controlled   Lab Results  Component Value Date   NA 137 04/11/2021   K 3.9 04/11/2021   CL 100 04/11/2021   CO2 30 04/11/2021

## 2021-04-13 NOTE — Assessment & Plan Note (Signed)
Untreated due to statin myalgias with 5 mg daily rosuvastatin dose.  encouarged to resume at twice weekly dose   Lab Results  Component Value Date   CHOL 223 (H) 04/11/2021   HDL 62.20 04/11/2021   LDLCALC 143 (H) 04/11/2021   LDLDIRECT 156.0 04/05/2020   TRIG 91.0 04/11/2021   CHOLHDL 4 04/11/2021

## 2021-04-13 NOTE — Assessment & Plan Note (Signed)
Secondary to atrial fibrillation, chronic.  She is anticoagulated with warfarin . CBC is normal   Lab Results  Component Value Date   WBC 6.7 02/01/2021   HGB 12.5 02/01/2021   HCT 37.1 02/01/2021   MCV 93.5 02/01/2021   PLT 240 02/01/2021

## 2021-04-25 DIAGNOSIS — Z7901 Long term (current) use of anticoagulants: Secondary | ICD-10-CM | POA: Diagnosis not present

## 2021-05-10 DIAGNOSIS — I480 Paroxysmal atrial fibrillation: Secondary | ICD-10-CM | POA: Diagnosis not present

## 2021-05-10 DIAGNOSIS — I493 Ventricular premature depolarization: Secondary | ICD-10-CM | POA: Diagnosis not present

## 2021-05-10 DIAGNOSIS — R791 Abnormal coagulation profile: Secondary | ICD-10-CM | POA: Diagnosis not present

## 2021-05-10 DIAGNOSIS — E782 Mixed hyperlipidemia: Secondary | ICD-10-CM | POA: Diagnosis not present

## 2021-05-10 DIAGNOSIS — I10 Essential (primary) hypertension: Secondary | ICD-10-CM | POA: Diagnosis not present

## 2021-05-29 DIAGNOSIS — Z7901 Long term (current) use of anticoagulants: Secondary | ICD-10-CM | POA: Diagnosis not present

## 2021-06-05 DIAGNOSIS — Z79899 Other long term (current) drug therapy: Secondary | ICD-10-CM | POA: Diagnosis not present

## 2021-06-05 DIAGNOSIS — Z5181 Encounter for therapeutic drug level monitoring: Secondary | ICD-10-CM | POA: Diagnosis not present

## 2021-06-05 DIAGNOSIS — I480 Paroxysmal atrial fibrillation: Secondary | ICD-10-CM | POA: Diagnosis not present

## 2021-06-07 DIAGNOSIS — Z7901 Long term (current) use of anticoagulants: Secondary | ICD-10-CM | POA: Diagnosis not present

## 2021-06-19 DIAGNOSIS — R791 Abnormal coagulation profile: Secondary | ICD-10-CM | POA: Diagnosis not present

## 2021-06-28 DIAGNOSIS — R791 Abnormal coagulation profile: Secondary | ICD-10-CM | POA: Diagnosis not present

## 2021-06-29 DIAGNOSIS — H35372 Puckering of macula, left eye: Secondary | ICD-10-CM | POA: Diagnosis not present

## 2021-07-18 DIAGNOSIS — R791 Abnormal coagulation profile: Secondary | ICD-10-CM | POA: Diagnosis not present

## 2021-07-23 ENCOUNTER — Telehealth: Payer: Self-pay | Admitting: Internal Medicine

## 2021-07-23 ENCOUNTER — Other Ambulatory Visit: Payer: Self-pay | Admitting: Internal Medicine

## 2021-07-23 MED ORDER — ACYCLOVIR 400 MG PO TABS: 400.0000 mg | ORAL_TABLET | Freq: Every day | ORAL | 0 refills | Status: DC

## 2021-07-23 MED ORDER — GABAPENTIN 100 MG PO CAPS: 200.0000 mg | ORAL_CAPSULE | Freq: Three times a day (TID) | ORAL | 1 refills | Status: DC

## 2021-07-23 NOTE — Telephone Encounter (Signed)
Patient is calling in to request a refill on her gabapentin.Please send it to the [facility].

## 2021-07-23 NOTE — Telephone Encounter (Signed)
I see that you have refilled the gabapentin.  I have refilled acyclovir

## 2021-07-23 NOTE — Telephone Encounter (Signed)
Patient is requesting a refill on her gabapentin (NEURONTIN) 100 MG capsule.

## 2021-07-23 NOTE — Telephone Encounter (Signed)
Patient informed, Due to the high volume of calls and your symptoms we have to forward your call to our Triage Nurse to expedient your call. Please hold for the transfer.  Patient transferred to Access Nurse. Due to having shingles in June and she is starting to have the lesions again that are painful and she is unsure what to do or if she can be prescribed the medicine from before. No openings in office or virtual.

## 2021-07-23 NOTE — Telephone Encounter (Signed)
Access nurse instructed patient to be evaluated at UC due to no availability. Patient refused to go to UC. She stated she wants a refill of acyclovir and gabapentin due to the rash in the same side and having the same sx on shingles.

## 2021-08-14 DIAGNOSIS — C44519 Basal cell carcinoma of skin of other part of trunk: Secondary | ICD-10-CM | POA: Diagnosis not present

## 2021-08-14 DIAGNOSIS — D2271 Melanocytic nevi of right lower limb, including hip: Secondary | ICD-10-CM | POA: Diagnosis not present

## 2021-08-14 DIAGNOSIS — D2262 Melanocytic nevi of left upper limb, including shoulder: Secondary | ICD-10-CM | POA: Diagnosis not present

## 2021-08-14 DIAGNOSIS — D485 Neoplasm of uncertain behavior of skin: Secondary | ICD-10-CM | POA: Diagnosis not present

## 2021-08-14 DIAGNOSIS — L821 Other seborrheic keratosis: Secondary | ICD-10-CM | POA: Diagnosis not present

## 2021-08-14 DIAGNOSIS — D2261 Melanocytic nevi of right upper limb, including shoulder: Secondary | ICD-10-CM | POA: Diagnosis not present

## 2021-08-14 DIAGNOSIS — Z85828 Personal history of other malignant neoplasm of skin: Secondary | ICD-10-CM | POA: Diagnosis not present

## 2021-08-16 ENCOUNTER — Ambulatory Visit: Payer: Medicare Other

## 2021-08-20 DIAGNOSIS — Z7901 Long term (current) use of anticoagulants: Secondary | ICD-10-CM | POA: Diagnosis not present

## 2021-09-11 DIAGNOSIS — I493 Ventricular premature depolarization: Secondary | ICD-10-CM | POA: Diagnosis not present

## 2021-09-11 DIAGNOSIS — I10 Essential (primary) hypertension: Secondary | ICD-10-CM | POA: Diagnosis not present

## 2021-09-11 DIAGNOSIS — E782 Mixed hyperlipidemia: Secondary | ICD-10-CM | POA: Diagnosis not present

## 2021-09-11 DIAGNOSIS — I480 Paroxysmal atrial fibrillation: Secondary | ICD-10-CM | POA: Diagnosis not present

## 2021-09-13 DIAGNOSIS — H02403 Unspecified ptosis of bilateral eyelids: Secondary | ICD-10-CM | POA: Diagnosis not present

## 2021-09-20 ENCOUNTER — Ambulatory Visit (INDEPENDENT_AMBULATORY_CARE_PROVIDER_SITE_OTHER): Payer: Medicare Other

## 2021-09-20 VITALS — Ht 66.0 in | Wt 166.0 lb

## 2021-09-20 DIAGNOSIS — Z0000 Encounter for general adult medical examination without abnormal findings: Secondary | ICD-10-CM | POA: Diagnosis not present

## 2021-09-20 DIAGNOSIS — Z7901 Long term (current) use of anticoagulants: Secondary | ICD-10-CM | POA: Diagnosis not present

## 2021-09-20 NOTE — Progress Notes (Signed)
Subjective:   Lashuna Tamashiro is a 80 y.o. female who presents for Medicare Annual (Subsequent) preventive examination.  Review of Systems    No ROS.  Medicare Wellness Virtual Visit.  Visual/audio telehealth visit, UTA vital signs.   See social history for additional risk factors.   Cardiac Risk Factors include: advanced age (>23men, >39 women)     Objective:    Today's Vitals   09/20/21 1404  Weight: 166 lb (75.3 kg)  Height: 5\' 6"  (1.676 m)   Body mass index is 26.79 kg/m.  Advanced Directives 09/20/2021 01/30/2021 01/29/2021 11/23/2020 08/15/2020 04/16/2019 01/27/2018  Does Patient Have a Medical Advance Directive? Yes Yes No Yes Yes Yes Yes  Type of Paramedic of [place];Living will Healthcare Power of Attorney - Living will;Healthcare Power of [place];Living will [place];Living will [place];Living will  Does patient want to make changes to medical advance directive? No - Patient declined - - - No - Patient declined No - Patient declined No - Patient declined  Copy of [place] in Chart? No - copy requested - - - No - copy requested No - copy requested No - copy requested  Would patient like information on creating a medical advance directive? - No - Patient declined No - Patient declined - - - -    Current Medications (verified) Outpatient Encounter Medications as of 09/20/2021  Medication Sig   acetaminophen (TYLENOL) 500 MG tablet Take 500 mg by mouth every 6 (six) hours as needed for moderate pain or headache.   acyclovir (ZOVIRAX) 400 MG tablet Take 1 tablet (400 mg total) by mouth 5 (five) times daily.   amiodarone (PACERONE) 200 MG tablet Take 1 tablet (200 mg total) by mouth 2 (two) times daily.   b complex vitamins capsule Take 1 capsule by mouth daily.   bisacodyl (DULCOLAX) 5 MG EC tablet Take 5 mg by mouth daily as needed for moderate  constipation.   Cholecalciferol (VITAMIN D3) 125 MCG (5000 UT) TABS Take 5,000 Units by mouth daily.   diltiazem (CARDIZEM CD) 120 MG 24 hr capsule Take 1 capsule (120 mg total) by mouth daily.   diphenhydrAMINE (BENADRYL) 25 mg capsule Take 25 mg by mouth every 6 (six) hours as needed for allergies.   docusate sodium (COLACE) 100 MG capsule Take 100 mg by mouth daily as needed for mild constipation.   furosemide (LASIX) 20 MG tablet TAKE 1 TABLET DAILY   gabapentin (NEURONTIN) 100 MG capsule Take 2 capsules (200 mg total) by mouth 3 (three) times daily.   Glucosamine-Chondroit-Vit C-Mn (GLUCOSAMINE 1500 COMPLEX PO) Take 1,500 mg by mouth daily.   Glycerin-Polysorbate 80 (REFRESH DRY EYE THERAPY OP) Place 1 drop into both eyes 2 (two) times daily.   hydrOXYzine (VISTARIL) 25 MG capsule Take 1 capsule (25 mg total) by mouth every 8 (eight) hours as needed.   magnesium oxide (MAG-OX) 400 MG tablet Take 400 mg by mouth in the morning and at bedtime.   metoprolol succinate (TOPROL-XL) 100 MG 24 hr tablet Take 1 tablet (100 mg total) by mouth 2 (two) times daily. Take with or immediately following a meal.   Multiple Vitamin (MULTIVITAMIN WITH MINERALS) TABS tablet Take 0.5 tablets by mouth 2 (two) times daily.   Omega-3 Fatty Acids (FISH OIL) 1000 MG CAPS Take 2,000 mg by mouth in the morning, at noon, and at bedtime.   omeprazole (PRILOSEC) 20 MG capsule Take  1 capsule (20 mg total) by mouth daily.   vitamin E 1000 UNIT capsule Take 1,000 Units by mouth 2 (two) times daily.   warfarin (COUMADIN) 3 MG tablet Take 1.5-3 mg by mouth See admin instructions. Take 1.5 mg Fridays and Sundays and 3 mg other days of the week. (Takes at night)   [DISCONTINUED] levalbuterol (XOPENEX HFA) 45 MCG/ACT inhaler Inhale 1-2 puffs into the lungs every 4 (four) hours as needed for wheezing.   No facility-administered encounter medications on file as of 09/20/2021.    Allergies (verified) Prednisone, Pulmicort  [budesonide], Clonidine, Lotemax [loteprednol etabonate], Morphine and related, Trovan [alatrofloxacin mesylate], Zelnorm [tegaserod maleate], and Erythromycin   History: Past Medical History:  Diagnosis Date   A-fib ([place])    Chronic sinusitis    Fibrocystic breast disease    History of pneumonia 1999   Hyperlipidemia    Irritable bowel syndrome    Lichen planus    lymphoma August 2013   Low grade B cell   Mild tricuspid insufficiency Jan 2012   E[facility], [place]   Moderate mitral insufficiency JAN 2012   E[facility], [place]   Past Surgical History:  Procedure Laterality Date   ABDOMINAL HYSTERECTOMY     precancerous cervix,     ABLATION OF DYSRHYTHMIC FOCUS  August 2013   [facility], Dr. Marcello Moores   BREAST SURGERY     bilateral, benign biopsies   CARDIOVERSION N/A 11/23/2020   Procedure: CARDIOVERSION;  Surgeon: Corey Skains, MD;  Location: [facility] ORS;  Service: Cardiovascular;  Laterality: N/A;   CARDIOVERSION N/A 01/17/2021   Procedure: CARDIOVERSION;  Surgeon: Corey Skains, MD;  Location: [facility] OR[facility];  Service: Cardiovascular;  Laterality: N/A;   CARDIOVERSION N/A 01/31/2021   Procedure: CARDIOVERSION;  Surgeon: Corey Skains, MD;  Location: [facility] ORS;  Service: Cardiovascular;  Laterality: N/A;   MAXILLARY SINUS LIFT  1990's   [name]   oophorectomy     squamous cell removal Right    right leg calf area.   TEE WITHOUT CARDIOVERSION N/A 01/31/2021   Procedure: TRANSESOPHAGEAL ECHOCARDIOGRAM (TEE);  Surgeon: Corey Skains, MD;  Location: [facility] ORS;  Service: Cardiovascular;  Laterality: N/A;   Family History  Problem Relation Age of Onset   Cancer Mother        Breast   Hyperlipidemia Father    Heart disease Father    Hypertension Father    Kidney disease Father    Cancer Maternal Grandfather        colon CA   Diverticulitis Sister    Social History   Socioeconomic History   Marital status: Married    Spouse name: [name]    Number of children: 2   Years of  education: Not on file   Highest education level: Not on file  Occupational History   Not on file  Tobacco Use   Smoking status: Never   Smokeless tobacco: Never  Vaping Use   Vaping Use: Not on file  Substance and Sexual Activity   Alcohol use: Yes    Alcohol/week: 4.0 standard drinks    Types: 4 Glasses of wine per week    Comment: Occ.   Drug use: No   Sexual activity: Not Currently    Birth control/protection: Post-menopausal  Other Topics Concern   Not on file  Social History Narrative   Lives at home with spouse    Social Determinants of Health   Financial Resource Strain: Low Risk    Difficulty of Paying Living Expenses:  Not hard at all  Food Insecurity: No Food Insecurity   Worried About Charity fundraiser in the Last Year: Never true   Ran Out of Food in the Last Year: Never true  Transportation Needs: No Transportation Needs   Lack of Transportation (Medical): No   Lack of Transportation (Non-Medical): No  Physical Activity: Insufficiently Active   Days of Exercise per Week: 5 days   Minutes of Exercise per Session: 20 min  Stress: No Stress Concern Present   Feeling of Stress : Not at all  Social Connections: Unknown   Frequency of Communication with Friends and Family: More than three times a week   Frequency of Social Gatherings with Friends and Family: More than three times a week   Attends Religious Services: Not on Electrical engineer or Organizations: Not on file   Attends Archivist Meetings: Not on file   Marital Status: Married    Tobacco Counseling Counseling given: Not Answered   Clinical Intake:  Pre-visit preparation completed: Yes        Diabetes: No  How often do you need to have someone help you when you read instructions, pamphlets, or other written materials from your doctor or pharmacy?: 1 - Never   Interpreter Needed?: No      Activities of Daily Living In your present state of health, do you have  any difficulty performing the following activities: 09/20/2021 01/30/2021  Hearing? N -  Vision? N -  Difficulty concentrating or making decisions? N -  Walking or climbing stairs? N -  Dressing or bathing? N -  Doing errands, shopping? N N  Preparing Food and eating ? N -  Using the Toilet? N -  In the past six months, have you accidently leaked urine? N -  Do you have problems with loss of bowel control? N -  Managing your Medications? N -  Managing your Finances? N -  Housekeeping or managing your Housekeeping? N -  Some recent data might be hidden    Patient Care Team: Crecencio Mc, MD as PCP - General (Internal Medicine) Corey Skains, MD as Referring Physician (Internal Medicine)  Indicate any recent Medical Services you may have received from other than [facility] providers in the past year (date may be approximate).     Assessment:   This is a routine wellness examination for [place].  Virtual Visit via Telephone Note  I connected with  [name] on 09/20/21 at  2:00 PM EST by telephone and verified that I am speaking with the correct person using two identifiers.  Location: Patient: home Provider: office Persons participating in the virtual visit: patient/Nurse Health Advisor   I discussed the limitations, risks, security and privacy concerns of performing an evaluation and management service by telephone and the availability of in person appointments. The patient expressed understanding and agreed to proceed.  Interactive audio and video telecommunications were attempted between this nurse and patient, however failed, due to patient having technical difficulties OR patient did not have access to video capability.  We continued and completed visit with audio only.  Some vital signs may be absent or patient reported.   Hearing/Vision screen Hearing Screening - Comments:: Patient is able to hear conversational tones without difficulty. No issues  reported. Vision Screening - Comments:: Followed by [facility]  Wear lenses when reading Cataract extraction, bilateral They have regular follow up with the ophthalmologist  Dietary issues and exercise  activities discussed: Current Exercise Habits: Home exercise routine, Intensity: Mild Healthy diet Good water intake   Goals Addressed               This Visit's Progress     Patient Stated     Reduce sugar intake (pt-stated)        Lose about 10lb Walk for exercise       Depression Screen PHQ 2/9 Scores 09/20/2021 04/11/2021 08/15/2020 04/16/2019 01/27/2018 01/08/2018 12/26/2016  PHQ - 2 Score 0 0 0 0 0 0 0  PHQ- 9 Score - 0 - - - 0 -    Fall Risk Fall Risk  09/20/2021 04/11/2021 10/11/2020 08/15/2020 04/05/2020  Falls in the past year? 0 0 0 0 1  Number falls in past yr: 0 - - 0 0  Comment - - - - -  Injury with Fall? - - - - 0  Follow up Falls evaluation completed Falls evaluation completed Falls evaluation completed Falls evaluation completed Falls evaluation completed    [place]: Home free of loose throw rugs in walkways, pet beds, electrical cords, etc? Yes  Adequate lighting in your home to reduce risk of falls? Yes   ASSISTIVE DEVICES UTILIZED TO PREVENT FALLS: Use of a cane, walker or w/c? No   TIMED UP AND GO: Was the test performed? No .   Cognitive Function: Patient is alert and oriented x3.  MMSE - Mini Mental State Exam 12/26/2016 11/27/2015  Orientation to time 5 5  Orientation to Place 5 5  Registration 3 3  Attention/ Calculation 4 5  Recall 2 3  Language- name 2 objects 2 2  Language- repeat 1 1  Language- follow 3 step command 3 3  Language- read & follow direction 1 1  Write a sentence 1 1  Copy design 1 1  Total score 28 30     6CIT Screen 09/20/2021 08/15/2020 04/16/2019 01/27/2018  What Year? 0 points 0 points 0 points 0 points  What month? 0 points 0 points 0 points 0 points  What time? 0 points  0 points 0 points 0 points  Count back from 20 0 points - 0 points 0 points  Months in reverse 0 points - 0 points 0 points  Repeat phrase 0 points - - 0 points  Total Score 0 - - 0    Immunizations Immunization History  Administered Date(s) Administered   Fluad Quad(high Dose 65+) 10/11/2020   Influenza, High Dose Seasonal PF 09/26/2016   Influenza, Seasonal, Injecte, Preservative Fre 11/24/2012   Influenza,inj,Quad PF,6+ Mos 08/24/2013, 09/15/2014, 08/30/2015, 09/15/2017, 09/09/2018   Influenza-Unspecified 08/04/2013, 08/07/2014, 09/19/2015, 08/10/2019   PFIZER Comirnaty(Gray Top)Covid-19 Tri-Sucrose Vaccine 11/30/2019, 12/21/2019, 08/23/2020   PFIZER(Purple Top)SARS-COV-2 Vaccination 11/30/2019, 12/21/2019, 08/23/2020   Pneumococcal Conjugate-13 11/27/2015   Pneumococcal Polysaccharide-23 11/05/2013, 04/05/2020   Tdap 11/28/2011   Zoster, Live 05/17/2008   Flu Vaccine status: Due, Education has been provided regarding the importance of this vaccine. Advised may receive this vaccine at local pharmacy or Health Dept. Aware to provide a copy of the vaccination record if obtained from local pharmacy or Health Dept. Verbalized acceptance and understanding. Deferred.   Shingrix Completed?: No.    Education has been provided regarding the importance of this vaccine. Patient has been advised to call insurance company to determine out of pocket expense if they have not yet received this vaccine. Advised may also receive vaccine at local pharmacy or Health Dept. Verbalized acceptance and understanding.  Screening Tests Health Maintenance  Topic Date Due   COVID-19 Vaccine (4 - Booster for Pfizer series) 10/06/2021 (Originally 10/18/2020)   Zoster Vaccines- Shingrix (1 of 2) 12/21/2021 (Originally 09/05/1960)   INFLUENZA VACCINE  02/01/2022 (Originally 06/04/2021)   COLONOSCOPY (Pts 45-20yrs Insurance coverage will need to be confirmed)  09/20/2022 (Originally 02/12/2012)   TETANUS/TDAP   11/27/2021   Pneumonia Vaccine 32+ Years old  Completed   DEXA SCAN  Completed   HPV VACCINES  Aged Out   Health Maintenance There are no preventive care reminders to display for this patient.  Colonoscopy/Cologuard- deferred per patient.   Mammogram- deferred per patient. Prefers to discuss with pcp prior to ordering. Last completed 07/2020.   Lung Cancer Screening: (Low Dose CT Chest recommended if Age 50-80 years, 30 pack-year currently smoking OR have quit w/in 15years.) does not qualify.   Hepatitis C Screening: does not qualify  Vision Screening: Recommended annual ophthalmology exams for early detection of glaucoma and other disorders of the eye.  Dental Screening: Recommended annual dental exams for proper oral hygiene  Community Resource Referral / Chronic Care Management: CRR required this visit?  No   CCM required this visit?  No      Plan:   Keep all routine maintenance appointments.   I have personally reviewed and noted the following in the patient's chart:   Medical and social history Use of alcohol, tobacco or illicit drugs  Current medications and supplements including opioid prescriptions. Not taking opioids.  Functional ability and status Nutritional status Physical activity Advanced directives List of other physicians Hospitalizations, surgeries, and ER visits in previous 12 months Vitals Screenings to include cognitive, depression, and falls Referrals and appointments  In addition, I have reviewed and discussed with patient certain preventive protocols, quality metrics, and best practice recommendations. A written personalized care plan for preventive services as well as general preventive health recommendations were provided to patient.     Varney Biles, LPN   39/01/91

## 2021-09-20 NOTE — Patient Instructions (Addendum)
Ms. Carol [name] , Thank you for taking time to come for your Medicare Wellness Visit. I appreciate your ongoing commitment to your health goals. Please review the following plan we discussed and let me know if I can assist you in the future.   These are the goals we discussed:  Goals       Patient Stated     Reduce sugar intake (pt-stated)      Lose about 10lb Walk for exercise        This is a list of the screening recommended for you and due dates:  Health Maintenance  Topic Date Due   COVID-19 Vaccine (4 - Booster for Pfizer series) 10/06/2021*   Zoster (Shingles) Vaccine (1 of 2) 12/21/2021*   Flu Shot  02/01/2022*   Colon Cancer Screening  09/20/2022*   Tetanus Vaccine  11/27/2021   Pneumonia Vaccine  Completed   DEXA scan (bone density measurement)  Completed   HPV Vaccine  Aged Out  *Topic was postponed. The date shown is not the original due date.    Advanced directives: End of life planning; Advance aging; Advanced directives discussed.  Copy of current HCPOA/Living Will requested.    Follow up in one year for your annual wellness visit    Preventive Care 65 Years and Older, Female Preventive care refers to lifestyle choices and visits with your health care provider that can promote health and wellness. What does preventive care include? A yearly physical exam. This is also called an annual well check. Dental exams once or twice a year. Routine eye exams. Ask your health care provider how often you should have your eyes checked. Personal lifestyle choices, including: Daily care of your teeth and gums. Regular physical activity. Eating a healthy diet. Avoiding tobacco and drug use. Limiting alcohol use. Practicing safe sex. Taking low-dose aspirin every day. Taking vitamin and mineral supplements as recommended by your health care provider. What happens during an annual well check? The services and screenings done by your health care provider during your annual  well check will depend on your age, overall health, lifestyle risk factors, and family history of disease. Counseling  Your health care provider may ask you questions about your: Alcohol use. Tobacco use. Drug use. Emotional well-being. Home and relationship well-being. Sexual activity. Eating habits. History of falls. Memory and ability to understand (cognition). Work and work Statistician. Reproductive health. Screening  You may have the following tests or measurements: Height, weight, and BMI. Blood pressure. Lipid and cholesterol levels. These may be checked every 5 years, or more frequently if you are over 60 years old. Skin check. Lung cancer screening. You may have this screening every year starting at age 98 if you have a 30-pack-year history of smoking and currently smoke or have quit within the past 15 years. Fecal occult blood test (FOBT) of the stool. You may have this test every year starting at age 38. Flexible sigmoidoscopy or colonoscopy. You may have a sigmoidoscopy every 5 years or a colonoscopy every 10 years starting at age 81. Hepatitis C blood test. Hepatitis B blood test. Sexually transmitted disease (STD) testing. Diabetes screening. This is done by checking your blood sugar (glucose) after you have not eaten for a while (fasting). You may have this done every 1-3 years. Bone density scan. This is done to screen for osteoporosis. You may have this done starting at age 59. Mammogram. This may be done every 1-2 years. Talk to your health care provider about how  often you should have regular mammograms. Talk with your health care provider about your test results, treatment options, and if necessary, the need for more tests. Vaccines  Your health care provider may recommend certain vaccines, such as: Influenza vaccine. This is recommended every year. Tetanus, diphtheria, and acellular pertussis (Tdap, Td) vaccine. You may need a Td booster every 10 years. Zoster  vaccine. You may need this after age 70. Pneumococcal 13-valent conjugate (PCV13) vaccine. One dose is recommended after age 28. Pneumococcal polysaccharide (PPSV23) vaccine. One dose is recommended after age 27. Talk to your health care provider about which screenings and vaccines you need and how often you need them. This information is not intended to replace advice given to you by your health care provider. Make sure you discuss any questions you have with your health care provider. Document Released: 11/17/2015 Document Revised: 07/10/2016 Document Reviewed: 08/22/2015 Elsevier Interactive Patient Education  2017 [place] Prevention in the Home Falls can cause injuries. They can happen to people of all ages. There are many things you can do to make your home safe and to help prevent falls. What can I do on the outside of my home? Regularly fix the edges of walkways and driveways and fix any cracks. Remove anything that might make you trip as you walk through a door, such as a raised step or threshold. Trim any bushes or trees on the path to your home. Use bright outdoor lighting. Clear any walking paths of anything that might make someone trip, such as rocks or tools. Regularly check to see if handrails are loose or broken. Make sure that both sides of any steps have handrails. Any raised decks and porches should have guardrails on the edges. Have any leaves, snow, or ice cleared regularly. Use sand or salt on walking paths during winter. Clean up any spills in your garage right away. This includes oil or grease spills. What can I do in the bathroom? Use night lights. Install grab bars by the toilet and in the tub and shower. Do not use towel bars as grab bars. Use non-skid mats or decals in the tub or shower. If you need to sit down in the shower, use a plastic, non-slip stool. Keep the floor dry. Clean up any water that spills on the floor as soon as it happens. Remove  soap buildup in the tub or shower regularly. Attach bath mats securely with double-sided non-slip rug tape. Do not have throw rugs and other things on the floor that can make you trip. What can I do in the bedroom? Use night lights. Make sure that you have a light by your bed that is easy to reach. Do not use any sheets or blankets that are too big for your bed. They should not hang down onto the floor. Have a firm chair that has side arms. You can use this for support while you get dressed. Do not have throw rugs and other things on the floor that can make you trip. What can I do in the kitchen? Clean up any spills right away. Avoid walking on wet floors. Keep items that you use a lot in easy-to-reach places. If you need to reach something above you, use a strong step stool that has a grab bar. Keep electrical cords out of the way. Do not use floor polish or wax that makes floors slippery. If you must use wax, use non-skid floor wax. Do not have throw rugs and other things  on the floor that can make you trip. What can I do with my stairs? Do not leave any items on the stairs. Make sure that there are handrails on both sides of the stairs and use them. Fix handrails that are broken or loose. Make sure that handrails are as long as the stairways. Check any carpeting to make sure that it is firmly attached to the stairs. Fix any carpet that is loose or worn. Avoid having throw rugs at the top or bottom of the stairs. If you do have throw rugs, attach them to the floor with carpet tape. Make sure that you have a light switch at the top of the stairs and the bottom of the stairs. If you do not have them, ask someone to add them for you. What else can I do to help prevent falls? Wear shoes that: Do not have high heels. Have rubber bottoms. Are comfortable and fit you well. Are closed at the toe. Do not wear sandals. If you use a stepladder: Make sure that it is fully opened. Do not climb a  closed stepladder. Make sure that both sides of the stepladder are locked into place. Ask someone to hold it for you, if possible. Clearly mark and make sure that you can see: Any grab bars or handrails. First and last steps. Where the edge of each step is. Use tools that help you move around (mobility aids) if they are needed. These include: Canes. Walkers. Scooters. Crutches. Turn on the lights when you go into a dark area. Replace any light bulbs as soon as they burn out. Set up your furniture so you have a clear path. Avoid moving your furniture around. If any of your floors are uneven, fix them. If there are any pets around you, be aware of where they are. Review your medicines with your doctor. Some medicines can make you feel dizzy. This can increase your chance of falling. Ask your doctor what other things that you can do to help prevent falls. This information is not intended to replace advice given to you by your health care provider. Make sure you discuss any questions you have with your health care provider. Document Released: 08/17/2009 Document Revised: 03/28/2016 Document Reviewed: 11/25/2014 Elsevier Interactive Patient Education  2017 [employer].

## 2021-09-21 ENCOUNTER — Encounter: Payer: Self-pay | Admitting: Family

## 2021-09-21 ENCOUNTER — Ambulatory Visit (INDEPENDENT_AMBULATORY_CARE_PROVIDER_SITE_OTHER): Payer: Medicare Other | Admitting: Family

## 2021-09-21 ENCOUNTER — Other Ambulatory Visit: Payer: Self-pay

## 2021-09-21 VITALS — BP 114/86 | HR 54 | Temp 98.1°F | Ht 66.0 in | Wt 182.0 lb

## 2021-09-21 DIAGNOSIS — I4891 Unspecified atrial fibrillation: Secondary | ICD-10-CM

## 2021-09-21 DIAGNOSIS — J40 Bronchitis, not specified as acute or chronic: Secondary | ICD-10-CM

## 2021-09-21 DIAGNOSIS — R051 Acute cough: Secondary | ICD-10-CM | POA: Diagnosis not present

## 2021-09-21 MED ORDER — AMOXICILLIN-POT CLAVULANATE 875-125 MG PO TABS: 1.0000 | ORAL_TABLET | Freq: Two times a day (BID) | ORAL | 0 refills | Status: DC

## 2021-09-21 NOTE — Patient Instructions (Signed)
Be sure to rest, drink plenty of fluids, and take antibiotic as prescribed which has been sent to your preferred pharmacy. Follow up if fever >101, if symptoms worsen or if symptoms are not improved in 3 days.   Please stop your over-the-counter cough medication that you have been taking as it may be making your Afib worse.  Please reach out to your cardiologist and let him know that you are back in atrial fibrillation, and also that we have prescribed an antibiotic so that they can adjust your Coumadin as necessary. Please continue medication as prescribed.  If any onset of chest pain palpitations and/or shortness of breath please go to the emergency room immediately and/or call 911.  It was a pleasure seeing you today! Please do not hesitate to reach out with any questions and or concerns.  Regards,   Eugenia Pancoast

## 2021-09-21 NOTE — Progress Notes (Signed)
Established Patient Office Visit  Subjective:  Patient ID: Carol [name], female    DOB: [birth year]  Age: 80 y.o. MRN: [id number]  CC:  Chief Complaint  Patient presents with   Bronchitis    HPI Carol [name] is here today with c/o three week h/o wet productive cough with green sputum, scratchy throat, nasal congestion, sinus pressure and dull headache.  Tested negative for covid one week ago.  No sore throat. No fever and or chills.  Has been taking robitussin for cough with only mild relief, and benadryl for allergies symptoms  with only mild relief.   Past Medical History:  Diagnosis Date   A-fib [facility])    Chronic sinusitis    Fibrocystic breast disease    History of pneumonia 1999   Hyperlipidemia    Irritable bowel syndrome    Lichen planus    lymphoma August 2013   Low grade B cell   Mild tricuspid insufficiency Jan 2012   ECHO, [place]   Moderate mitral insufficiency JAN 2012   E[facility], [place]    Past Surgical History:  Procedure Laterality Date   ABDOMINAL HYSTERECTOMY     precancerous cervix,     ABLATION OF DYSRHYTHMIC FOCUS  August 2013   [facility], Dr. Marcello Moores   BREAST SURGERY     bilateral, benign biopsies   CARDIOVERSION N/A 11/23/2020   Procedure: CARDIOVERSION;  Surgeon: Corey Skains, MD;  Location: [facility];  Service: Cardiovascular;  Laterality: N/A;   CARDIOVERSION N/A 01/17/2021   Procedure: CARDIOVERSION;  Surgeon: Corey Skains, MD;  Location: [facility];  Service: Cardiovascular;  Laterality: N/A;   CARDIOVERSION N/A 01/31/2021   Procedure: CARDIOVERSION;  Surgeon: Corey Skains, MD;  Location: [facility];  Service: Cardiovascular;  Laterality: N/A;   MAXILLARY SINUS LIFT  1990's   Carol [name]   oophorectomy     squamous cell removal Right    right leg calf area.   TEE WITHOUT CARDIOVERSION N/A 01/31/2021   Procedure: TRANSESOPHAGEAL ECHOCARDIOGRAM (TEE);  Surgeon: Corey Skains, MD;  Location: [facility];   Service: Cardiovascular;  Laterality: N/A;    Family History  Problem Relation Age of Onset   Cancer Mother        Breast   Hyperlipidemia Father    Heart disease Father    Hypertension Father    Kidney disease Father    Cancer Maternal Grandfather        colon CA   Diverticulitis Sister     Social History   Socioeconomic History   Marital status: Married    Spouse name: Carol [name]    Number of children: 2   Years of education: Not on file   Highest education level: Not on file  Occupational History   Not on file  Tobacco Use   Smoking status: Never   Smokeless tobacco: Never  Vaping Use   Vaping Use: Not on file  Substance and Sexual Activity   Alcohol use: Yes    Alcohol/week: 4.0 standard drinks    Types: 4 Glasses of wine per week    Comment: Occ.   Drug use: No   Sexual activity: Not Currently    Birth control/protection: Post-menopausal  Other Topics Concern   Not on file  Social History Narrative   Lives at home with spouse    Social Determinants of Health   Financial Resource Strain: Low Risk    Difficulty of Paying Living Expenses: Not hard at all  Food Insecurity:  No Food Insecurity   Worried About Charity fundraiser in the Last Year: Never true   Ran Out of Food in the Last Year: Never true  Transportation Needs: No Transportation Needs   Lack of Transportation (Medical): No   Lack of Transportation (Non-Medical): No  Physical Activity: Insufficiently Active   Days of Exercise per Week: 5 days   Minutes of Exercise per Session: 20 min  Stress: No Stress Concern Present   Feeling of Stress : Not at all  Social Connections: Unknown   Frequency of Communication with Friends and Family: More than three times a week   Frequency of Social Gatherings with Friends and Family: More than three times a week   Attends Religious Services: Not on Electrical engineer or Organizations: Not on file   Attends Archivist Meetings: Not on file    Marital Status: Married  Human resources officer Violence: Not At Risk   Fear of Current or Ex-Partner: No   Emotionally Abused: No   Physically Abused: No   Sexually Abused: No    Outpatient Medications Prior to Visit  Medication Sig Dispense Refill   acetaminophen (TYLENOL) 500 MG tablet Take 500 mg by mouth every 6 (six) hours as needed for moderate pain or headache.     amiodarone (PACERONE) 200 MG tablet Take 1 tablet (200 mg total) by mouth 2 (two) times daily.     b complex vitamins capsule Take 1 capsule by mouth daily.     bisacodyl (DULCOLAX) 5 MG EC tablet Take 5 mg by mouth daily as needed for moderate constipation.     Cholecalciferol (VITAMIN D3) 125 MCG (5000 UT) TABS Take 5,000 Units by mouth daily.     diltiazem (CARDIZEM CD) 120 MG 24 hr capsule Take 1 capsule (120 mg total) by mouth daily.     diphenhydrAMINE (BENADRYL) 25 mg capsule Take 25 mg by mouth every 6 (six) hours as needed for allergies.     docusate sodium (COLACE) 100 MG capsule Take 100 mg by mouth daily as needed for mild constipation.     furosemide (LASIX) 20 MG tablet TAKE 1 TABLET DAILY 90 tablet 0   gabapentin (NEURONTIN) 100 MG capsule Take 2 capsules (200 mg total) by mouth 3 (three) times daily. (Patient taking differently: Take 100 mg by mouth daily. Patient taking 100 mg in the morning and 200 mg at night.) 540 capsule 1   Glucosamine-Chondroit-Vit C-Mn (GLUCOSAMINE 1500 COMPLEX PO) Take 1,500 mg by mouth daily.     Glycerin-Polysorbate 80 (REFRESH DRY EYE THERAPY OP) Place 1 drop into both eyes 2 (two) times daily.     hydrOXYzine (VISTARIL) 25 MG capsule Take 1 capsule (25 mg total) by mouth every 8 (eight) hours as needed. 30 capsule 0   magnesium oxide (MAG-OX) 400 MG tablet Take 400 mg by mouth in the morning and at bedtime.     metoprolol succinate (TOPROL-XL) 100 MG 24 hr tablet Take 1 tablet (100 mg total) by mouth 2 (two) times daily. Take with or immediately following a meal.     Multiple Vitamin  (MULTIVITAMIN WITH MINERALS) TABS tablet Take 0.5 tablets by mouth 2 (two) times daily.     Omega-3 Fatty Acids (FISH OIL) 1000 MG CAPS Take 2,000 mg by mouth in the morning, at noon, and at bedtime.     omeprazole (PRILOSEC) 20 MG capsule Take 1 capsule (20 mg total) by mouth daily. 90 capsule 1  vitamin E 1000 UNIT capsule Take 1,000 Units by mouth 2 (two) times daily.     warfarin (COUMADIN) 3 MG tablet Take 1.5-3 mg by mouth See admin instructions. Take 1.5 mg Fridays and Sundays and 3 mg other days of the week. (Takes at night) ((Patient is taking 6 mg as of 09/21/2021 for 2 weeks then has blood work.))     acyclovir (ZOVIRAX) 400 MG tablet Take 1 tablet (400 mg total) by mouth 5 (five) times daily. (Patient not taking: Reported on 09/21/2021) 35 tablet 0   No facility-administered medications prior to visit.    Allergies  Allergen Reactions   Prednisone Palpitations    A. fib   Pulmicort [Budesonide] Shortness Of Breath, Itching, Swelling and Other (See Comments)    Felt as if things were crawly on her   Clonidine Other (See Comments)    Pt felt like things were crawling on her arms.   Lotemax [Loteprednol Etabonate]     Broke out around the eye   Morphine And Related Itching and Other (See Comments)    "creepy crawly feeling"   Trovan [Alatrofloxacin Mesylate] Other (See Comments)    "effected the whole system"   Zelnorm [Tegaserod Maleate] Itching    Felt as if things were crawly on her    Erythromycin Rash    ROS Review of Systems  Constitutional:  Negative for chills and fever.  HENT:  Positive for congestion, postnasal drip and sinus pressure. Negative for sore throat.   Respiratory:  Positive for cough (wet productive with green sputum). Negative for shortness of breath and wheezing.   Cardiovascular:  Positive for palpitations (occasional palpitations in the last week not at current). Negative for chest pain and leg swelling.  Gastrointestinal:  Blood in stool: acute  cough.  Neurological:  Positive for headaches (dull pressure like).     Objective:    Physical Exam Constitutional:      General: She is not in acute distress.    Appearance: Normal appearance. She is not ill-appearing.  HENT:     Right Ear: Tympanic membrane normal.     Left Ear: Tympanic membrane normal.     Nose: Nose normal. No congestion or rhinorrhea.     Mouth/Throat:     Mouth: Mucous membranes are moist.     Pharynx: No pharyngeal swelling, oropharyngeal exudate or posterior oropharyngeal erythema.     Tonsils: No tonsillar exudate.  Eyes:     Extraocular Movements: Extraocular movements intact.     Conjunctiva/sclera: Conjunctivae normal.     Pupils: Pupils are equal, round, and reactive to light.  Neck:     Thyroid: No thyroid mass.  Cardiovascular:     Rate and Rhythm: Tachycardia present. Rhythm irregularly irregular.  Pulmonary:     Effort: Pulmonary effort is normal.     Breath sounds: Normal breath sounds.  Lymphadenopathy:     Cervical:     Right cervical: No superficial cervical adenopathy.    Left cervical: No superficial cervical adenopathy.  Neurological:     Mental Status: She is alert.    BP 114/86   Pulse (!) 54   Temp 98.1 F (36.7 C) (Temporal)   Ht 5\' 6"  (1.676 m)   Wt 182 lb (82.6 kg)   SpO2 98%   BMI 29.38 kg/m  Wt Readings from Last 3 Encounters:  09/21/21 182 lb (82.6 kg)  09/20/21 166 lb (75.3 kg)  04/11/21 166 lb (75.3 kg)     There are  no preventive care reminders to display for this patient.  There are no preventive care reminders to display for this patient.  Lab Results  Component Value Date   TSH 1.85 08/25/2018   Lab Results  Component Value Date   WBC 6.7 02/01/2021   HGB 12.5 02/01/2021   HCT 37.1 02/01/2021   MCV 93.5 02/01/2021   PLT 240 02/01/2021   Lab Results  Component Value Date   NA 137 04/11/2021   K 3.9 04/11/2021   CO2 30 04/11/2021   GLUCOSE 85 04/11/2021   BUN 16 04/11/2021   CREATININE  0.78 04/11/2021   BILITOT 0.7 04/11/2021   ALKPHOS 54 04/11/2021   AST 29 04/11/2021   ALT 25 04/11/2021   PROT 6.6 04/11/2021   ALBUMIN 4.1 04/11/2021   CALCIUM 9.4 04/11/2021   ANIONGAP 9 02/01/2021   GFR 71.64 04/11/2021   Lab Results  Component Value Date   CHOL 223 (H) 04/11/2021   Lab Results  Component Value Date   HDL 62.20 04/11/2021   Lab Results  Component Value Date   LDLCALC 143 (H) 04/11/2021   Lab Results  Component Value Date   TRIG 91.0 04/11/2021   Lab Results  Component Value Date   CHOLHDL 4 04/11/2021   Lab Results  Component Value Date   HGBA1C 5.7 05/27/2016      Assessment & Plan:   Problem List Items Addressed This Visit       Cardiovascular and Mediastinum   Atrial fibrillation ([place])    Pt with afib today, asymptomatic and rate controlled at 54 bpm. Advised pt to reach out to cardiologist for f/u. Continue medication as prescribed. antbx also given today, pt advised to inform cardiologist on antbx due to use of coumadin as dose may need to be adjusted. If any cp palp and or sob pt to call 911 and or go to ER immediately.      Relevant Orders   EKG 12-Lead     Respiratory   Bronchitis    Antibiotic prescribed, take as directed. Advised patient on supportive measures:  Be sure to rest, drink plenty of fluids. Follow up if fever >101, if symptoms worsen or if symptoms are not improved in 3 days. Patient verbalizes understanding.        Relevant Medications   amoxicillin-clavulanate (AUGMENTIN) 875-125 MG tablet     Other   Acute cough - Primary    Pt refuses tessalon perrles. Advised pt not to take any otc cough medication with DM/expectorant as it may worsen her afib. Increase oral fluids. Start antbx as prescribed.       Meds ordered this encounter  Medications   amoxicillin-clavulanate (AUGMENTIN) 875-125 MG tablet    Sig: Take 1 tablet by mouth 2 (two) times daily.    Dispense:  20 tablet    Refill:  0    Order  Specific Question:   Supervising Provider    Answer:   Diona Browner, AMY E [[id number]]    Follow-up: Return in about 3 days (around 09/24/2021), or if symptoms worsen or fail to improve.    Eugenia Pancoast, FNP

## 2021-09-21 NOTE — Assessment & Plan Note (Signed)
Antibiotic prescribed, take as directed. Advised patient on supportive measures:  Be sure to rest, drink plenty of fluids. Follow up if fever >101, if symptoms worsen or if symptoms are not improved in 3 days. Patient verbalizes understanding.

## 2021-09-21 NOTE — Assessment & Plan Note (Addendum)
Pt with afib today, asymptomatic and rate controlled at 54 bpm. Advised pt to reach out to cardiologist for f/u. Continue medication as prescribed. antbx also given today, pt advised to inform cardiologist on antbx due to use of coumadin as dose may need to be adjusted. If any cp palp and or sob pt to call 911 and or go to ER immediately.

## 2021-09-21 NOTE — Assessment & Plan Note (Signed)
Pt refuses tessalon perrles. Advised pt not to take any otc cough medication with DM/expectorant as it may worsen her afib. Increase oral fluids. Start antbx as prescribed.

## 2021-10-02 DIAGNOSIS — C44519 Basal cell carcinoma of skin of other part of trunk: Secondary | ICD-10-CM | POA: Diagnosis not present

## 2021-10-03 ENCOUNTER — Ambulatory Visit: Payer: Medicare Other | Admitting: Internal Medicine

## 2021-10-03 DIAGNOSIS — R791 Abnormal coagulation profile: Secondary | ICD-10-CM | POA: Diagnosis not present

## 2021-10-05 DIAGNOSIS — I5032 Chronic diastolic (congestive) heart failure: Secondary | ICD-10-CM | POA: Diagnosis not present

## 2021-10-05 DIAGNOSIS — I480 Paroxysmal atrial fibrillation: Secondary | ICD-10-CM | POA: Diagnosis not present

## 2021-10-05 DIAGNOSIS — E782 Mixed hyperlipidemia: Secondary | ICD-10-CM | POA: Diagnosis not present

## 2021-10-05 DIAGNOSIS — I493 Ventricular premature depolarization: Secondary | ICD-10-CM | POA: Diagnosis not present

## 2021-10-05 DIAGNOSIS — I10 Essential (primary) hypertension: Secondary | ICD-10-CM | POA: Diagnosis not present

## 2021-10-05 DIAGNOSIS — I341 Nonrheumatic mitral (valve) prolapse: Secondary | ICD-10-CM | POA: Diagnosis not present

## 2021-10-07 ENCOUNTER — Emergency Department: Payer: Medicare Other

## 2021-10-07 ENCOUNTER — Other Ambulatory Visit: Payer: Self-pay

## 2021-10-07 ENCOUNTER — Inpatient Hospital Stay
Admission: EM | Admit: 2021-10-07 | Discharge: 2021-10-09 | DRG: 291 | Disposition: A | Discharge status: Home / Self Care | Payer: Medicare Other | Attending: Internal Medicine | Admitting: Internal Medicine

## 2021-10-07 DIAGNOSIS — Z90710 Acquired absence of both cervix and uterus: Secondary | ICD-10-CM

## 2021-10-07 DIAGNOSIS — I429 Cardiomyopathy, unspecified: Secondary | ICD-10-CM | POA: Diagnosis present

## 2021-10-07 DIAGNOSIS — I341 Nonrheumatic mitral (valve) prolapse: Secondary | ICD-10-CM | POA: Diagnosis present

## 2021-10-07 DIAGNOSIS — C8511 Unspecified B-cell lymphoma, lymph nodes of head, face, and neck: Secondary | ICD-10-CM | POA: Diagnosis not present

## 2021-10-07 DIAGNOSIS — B029 Zoster without complications: Secondary | ICD-10-CM | POA: Diagnosis present

## 2021-10-07 DIAGNOSIS — D6859 Other primary thrombophilia: Secondary | ICD-10-CM | POA: Diagnosis present

## 2021-10-07 DIAGNOSIS — I071 Rheumatic tricuspid insufficiency: Secondary | ICD-10-CM | POA: Diagnosis present

## 2021-10-07 DIAGNOSIS — Z20822 Contact with and (suspected) exposure to covid-19: Secondary | ICD-10-CM | POA: Diagnosis present

## 2021-10-07 DIAGNOSIS — J811 Chronic pulmonary edema: Secondary | ICD-10-CM | POA: Diagnosis not present

## 2021-10-07 DIAGNOSIS — R739 Hyperglycemia, unspecified: Secondary | ICD-10-CM | POA: Diagnosis present

## 2021-10-07 DIAGNOSIS — J90 Pleural effusion, not elsewhere classified: Secondary | ICD-10-CM | POA: Diagnosis not present

## 2021-10-07 DIAGNOSIS — J9601 Acute respiratory failure with hypoxia: Secondary | ICD-10-CM | POA: Diagnosis not present

## 2021-10-07 DIAGNOSIS — I509 Heart failure, unspecified: Secondary | ICD-10-CM

## 2021-10-07 DIAGNOSIS — D6869 Other thrombophilia: Secondary | ICD-10-CM | POA: Diagnosis present

## 2021-10-07 DIAGNOSIS — Z881 Allergy status to other antibiotic agents status: Secondary | ICD-10-CM

## 2021-10-07 DIAGNOSIS — E871 Hypo-osmolality and hyponatremia: Secondary | ICD-10-CM | POA: Diagnosis present

## 2021-10-07 DIAGNOSIS — I081 Rheumatic disorders of both mitral and tricuspid valves: Secondary | ICD-10-CM | POA: Diagnosis present

## 2021-10-07 DIAGNOSIS — Z83438 Family history of other disorder of lipoprotein metabolism and other lipidemia: Secondary | ICD-10-CM

## 2021-10-07 DIAGNOSIS — I480 Paroxysmal atrial fibrillation: Secondary | ICD-10-CM | POA: Diagnosis present

## 2021-10-07 DIAGNOSIS — J40 Bronchitis, not specified as acute or chronic: Secondary | ICD-10-CM | POA: Diagnosis present

## 2021-10-07 DIAGNOSIS — R000 Tachycardia, unspecified: Secondary | ICD-10-CM | POA: Diagnosis not present

## 2021-10-07 DIAGNOSIS — I493 Ventricular premature depolarization: Secondary | ICD-10-CM | POA: Diagnosis present

## 2021-10-07 DIAGNOSIS — K219 Gastro-esophageal reflux disease without esophagitis: Secondary | ICD-10-CM | POA: Diagnosis present

## 2021-10-07 DIAGNOSIS — Z885 Allergy status to narcotic agent status: Secondary | ICD-10-CM

## 2021-10-07 DIAGNOSIS — I340 Nonrheumatic mitral (valve) insufficiency: Secondary | ICD-10-CM | POA: Diagnosis present

## 2021-10-07 DIAGNOSIS — I110 Hypertensive heart disease with heart failure: Secondary | ICD-10-CM | POA: Diagnosis not present

## 2021-10-07 DIAGNOSIS — K760 Fatty (change of) liver, not elsewhere classified: Secondary | ICD-10-CM | POA: Diagnosis present

## 2021-10-07 DIAGNOSIS — Z79899 Other long term (current) drug therapy: Secondary | ICD-10-CM

## 2021-10-07 DIAGNOSIS — Z8249 Family history of ischemic heart disease and other diseases of the circulatory system: Secondary | ICD-10-CM

## 2021-10-07 DIAGNOSIS — Z7901 Long term (current) use of anticoagulants: Secondary | ICD-10-CM

## 2021-10-07 DIAGNOSIS — N6019 Diffuse cystic mastopathy of unspecified breast: Secondary | ICD-10-CM | POA: Diagnosis present

## 2021-10-07 DIAGNOSIS — E785 Hyperlipidemia, unspecified: Secondary | ICD-10-CM | POA: Diagnosis present

## 2021-10-07 DIAGNOSIS — K581 Irritable bowel syndrome with constipation: Secondary | ICD-10-CM | POA: Diagnosis present

## 2021-10-07 DIAGNOSIS — Z853 Personal history of malignant neoplasm of breast: Secondary | ICD-10-CM

## 2021-10-07 DIAGNOSIS — Z888 Allergy status to other drugs, medicaments and biological substances status: Secondary | ICD-10-CM

## 2021-10-07 DIAGNOSIS — I5023 Acute on chronic systolic (congestive) heart failure: Secondary | ICD-10-CM | POA: Diagnosis not present

## 2021-10-07 DIAGNOSIS — I4891 Unspecified atrial fibrillation: Secondary | ICD-10-CM | POA: Diagnosis not present

## 2021-10-07 DIAGNOSIS — Z90722 Acquired absence of ovaries, bilateral: Secondary | ICD-10-CM

## 2021-10-07 DIAGNOSIS — Z789 Other specified health status: Secondary | ICD-10-CM | POA: Diagnosis present

## 2021-10-07 DIAGNOSIS — F419 Anxiety disorder, unspecified: Secondary | ICD-10-CM | POA: Diagnosis present

## 2021-10-07 DIAGNOSIS — R0602 Shortness of breath: Secondary | ICD-10-CM | POA: Diagnosis not present

## 2021-10-07 DIAGNOSIS — L439 Lichen planus, unspecified: Secondary | ICD-10-CM | POA: Diagnosis present

## 2021-10-07 DIAGNOSIS — R069 Unspecified abnormalities of breathing: Secondary | ICD-10-CM | POA: Diagnosis not present

## 2021-10-07 DIAGNOSIS — Z8701 Personal history of pneumonia (recurrent): Secondary | ICD-10-CM

## 2021-10-07 LAB — COMPREHENSIVE METABOLIC PANEL
ALT: 126 U/L — ABNORMAL HIGH (ref 0–44)
AST: 84 U/L — ABNORMAL HIGH (ref 15–41)
Albumin: 3.8 g/dL (ref 3.5–5.0)
Alkaline Phosphatase: 87 U/L (ref 38–126)
Anion gap: 10 (ref 5–15)
BUN: 20 mg/dL (ref 8–23)
CO2: 23 mmol/L (ref 22–32)
Calcium: 8.8 mg/dL — ABNORMAL LOW (ref 8.9–10.3)
Chloride: 96 mmol/L — ABNORMAL LOW (ref 98–111)
Creatinine, Ser: 0.82 mg/dL (ref 0.44–1.00)
GFR, Estimated: >60 mL/min (ref >60)
Glucose, Bld: 167 mg/dL — ABNORMAL HIGH (ref 70–99)
Potassium: 4 mmol/L (ref 3.5–5.1)
Sodium: 129 mmol/L — ABNORMAL LOW (ref 135–145)
Total Bilirubin: 2 mg/dL — ABNORMAL HIGH (ref 0.3–1.2)
Total Protein: 6.7 g/dL (ref 6.5–8.1)

## 2021-10-07 LAB — PHOSPHORUS: Phosphorus: 4 mg/dL (ref 2.5–4.6)

## 2021-10-07 LAB — CBC WITH DIFFERENTIAL/PLATELET
Abs Immature Granulocytes: 0.04 10*3/uL (ref 0.00–0.07)
Basophils Absolute: 0 10*3/uL (ref 0.0–0.1)
Basophils Relative: 0 %
Eosinophils Absolute: 0 10*3/uL (ref 0.0–0.5)
Eosinophils Relative: 0 %
HCT: 38.6 % (ref 36.0–46.0)
Hemoglobin: 12.8 g/dL (ref 12.0–15.0)
Immature Granulocytes: 0 %
Lymphocytes Relative: 19 %
Lymphs Abs: 2 10*3/uL (ref 0.7–4.0)
MCH: 33.2 pg (ref 26.0–34.0)
MCHC: 33.2 g/dL (ref 30.0–36.0)
MCV: 100 fL (ref 80.0–100.0)
Monocytes Absolute: 0.6 10*3/uL (ref 0.1–1.0)
Monocytes Relative: 6 %
Neutro Abs: 8.1 10*3/uL — ABNORMAL HIGH (ref 1.7–7.7)
Neutrophils Relative %: 75 %
Platelets: 260 10*3/uL (ref 150–400)
RBC: 3.86 MIL/uL — ABNORMAL LOW (ref 3.87–5.11)
RDW: 15.8 % — ABNORMAL HIGH (ref 11.5–15.5)
WBC: 10.8 10*3/uL — ABNORMAL HIGH (ref 4.0–10.5)
nRBC: 0 % (ref 0.0–0.2)

## 2021-10-07 LAB — TSH: TSH: 2.704 u[IU]/mL (ref 0.350–4.500)

## 2021-10-07 LAB — MAGNESIUM: Magnesium: 2 mg/dL (ref 1.7–2.4)

## 2021-10-07 LAB — BRAIN NATRIURETIC PEPTIDE: B Natriuretic Peptide: 1585.4 pg/mL — ABNORMAL HIGH (ref 0.0–100.0)

## 2021-10-07 LAB — PROCALCITONIN: Procalcitonin: <0.1 ng/mL

## 2021-10-07 LAB — RESP PANEL BY RT-PCR (FLU A&B, COVID) ARPGX2
Influenza A by PCR: NEGATIVE
Influenza B by PCR: NEGATIVE
SARS Coronavirus 2 by RT PCR: NEGATIVE

## 2021-10-07 LAB — TROPONIN I (HIGH SENSITIVITY)
Troponin I (High Sensitivity): 14 ng/L (ref <18)
Troponin I (High Sensitivity): 14 ng/L (ref <18)

## 2021-10-07 LAB — CBG MONITORING, ED: Glucose-Capillary: 143 mg/dL — ABNORMAL HIGH (ref 70–99)

## 2021-10-07 LAB — PROTIME-INR
INR: 3 — ABNORMAL HIGH (ref 0.8–1.2)
Prothrombin Time: 31.2 seconds — ABNORMAL HIGH (ref 11.4–15.2)

## 2021-10-07 MED ORDER — AMIODARONE HCL 200 MG PO TABS: 300.0000 mg | ORAL_TABLET | Freq: Two times a day (BID) | ORAL | Status: DC

## 2021-10-07 MED ORDER — INSULIN ASPART 100 UNIT/ML IJ SOLN: 0.0000 [IU] | Freq: Three times a day (TID) | INTRAMUSCULAR | Status: DC

## 2021-10-07 MED ORDER — METOPROLOL SUCCINATE ER 100 MG PO TB24
100.0000 mg | ORAL_TABLET | Freq: Two times a day (BID) | ORAL | Status: DC
  Administered 2021-10-07 – 2021-10-09 (×4): 100 mg via ORAL
  Filled 2021-10-07: qty 2
  Filled 2021-10-07: qty 1
  Filled 2021-10-07: qty 2
  Filled 2021-10-07: qty 1

## 2021-10-07 MED ORDER — METOPROLOL TARTRATE 5 MG/5ML IV SOLN
5.0000 mg | Freq: Once | INTRAVENOUS | Status: AC
  Administered 2021-10-07: 19:00:00 5 mg via INTRAVENOUS
  Filled 2021-10-07: qty 5

## 2021-10-07 MED ORDER — BISACODYL 5 MG PO TBEC: 5.0000 mg | DELAYED_RELEASE_TABLET | Freq: Every day | ORAL | Status: DC | PRN

## 2021-10-07 MED ORDER — WARFARIN - PHARMACIST DOSING INPATIENT
Freq: Every day | Status: DC
  Filled 2021-10-07: qty 1

## 2021-10-07 MED ORDER — ALBUTEROL SULFATE (2.5 MG/3ML) 0.083% IN NEBU: 2.5000 mg | INHALATION_SOLUTION | Freq: Four times a day (QID) | RESPIRATORY_TRACT | Status: DC | PRN

## 2021-10-07 MED ORDER — MAGNESIUM OXIDE 400 MG PO TABS
400.0000 mg | ORAL_TABLET | Freq: Two times a day (BID) | ORAL | Status: DC
  Administered 2021-10-08 (×2): 400 mg via ORAL
  Filled 2021-10-07 (×6): qty 1

## 2021-10-07 MED ORDER — FUROSEMIDE 10 MG/ML IJ SOLN
40.0000 mg | Freq: Once | INTRAMUSCULAR | Status: AC
  Administered 2021-10-07: 17:00:00 40 mg via INTRAVENOUS
  Filled 2021-10-07: qty 4

## 2021-10-07 MED ORDER — AMIODARONE HCL 200 MG PO TABS
200.0000 mg | ORAL_TABLET | ORAL | Status: AC
  Administered 2021-10-07: 19:00:00 200 mg via ORAL
  Filled 2021-10-07: qty 1

## 2021-10-07 MED ORDER — GABAPENTIN 100 MG PO CAPS
100.0000 mg | ORAL_CAPSULE | Freq: Every day | ORAL | Status: DC
  Administered 2021-10-07: 21:00:00 100 mg via ORAL
  Filled 2021-10-07: qty 1

## 2021-10-07 MED ORDER — DOCUSATE SODIUM 100 MG PO CAPS: 100.0000 mg | ORAL_CAPSULE | Freq: Every day | ORAL | Status: DC | PRN

## 2021-10-07 MED ORDER — METOPROLOL TARTRATE 5 MG/5ML IV SOLN
5.0000 mg | INTRAVENOUS | Status: DC | PRN
  Administered 2021-10-08: 5 mg via INTRAVENOUS
  Filled 2021-10-07: qty 5

## 2021-10-07 MED ORDER — VITAMIN E 180 MG (400 UNIT) PO CAPS
800.0000 [IU] | ORAL_CAPSULE | Freq: Two times a day (BID) | ORAL | Status: DC
  Administered 2021-10-07 – 2021-10-08 (×3): 800 [IU] via ORAL
  Filled 2021-10-07 (×5): qty 2

## 2021-10-07 MED ORDER — ACETAMINOPHEN 500 MG PO TABS: 1000.0000 mg | ORAL_TABLET | Freq: Four times a day (QID) | ORAL | Status: DC | PRN

## 2021-10-07 MED ORDER — HYDROCOD POLST-CPM POLST ER 10-8 MG/5ML PO SUER: 5.0000 mL | Freq: Every evening | ORAL | Status: DC | PRN

## 2021-10-07 MED ORDER — PANTOPRAZOLE SODIUM 40 MG PO TBEC
40.0000 mg | DELAYED_RELEASE_TABLET | Freq: Every day | ORAL | Status: DC
  Administered 2021-10-08: 40 mg via ORAL
  Filled 2021-10-07: qty 1

## 2021-10-07 MED ORDER — GABAPENTIN 100 MG PO CAPS
200.0000 mg | ORAL_CAPSULE | Freq: Every day | ORAL | Status: DC
  Administered 2021-10-08: 200 mg via ORAL
  Filled 2021-10-07: qty 2

## 2021-10-07 MED ORDER — GABAPENTIN 100 MG PO CAPS
100.0000 mg | ORAL_CAPSULE | Freq: Every day | ORAL | Status: DC
  Administered 2021-10-08 – 2021-10-09 (×2): 100 mg via ORAL
  Filled 2021-10-07 (×2): qty 1

## 2021-10-07 MED ORDER — ONDANSETRON HCL 4 MG/2ML IJ SOLN: 4.0000 mg | Freq: Four times a day (QID) | INTRAMUSCULAR | Status: DC | PRN

## 2021-10-07 MED ORDER — INSULIN ASPART 100 UNIT/ML IJ SOLN: 0.0000 [IU] | Freq: Every day | INTRAMUSCULAR | Status: DC

## 2021-10-07 MED ORDER — METOPROLOL TARTRATE 5 MG/5ML IV SOLN
5.0000 mg | Freq: Once | INTRAVENOUS | Status: AC
  Administered 2021-10-07: 18:00:00 5 mg via INTRAVENOUS
  Filled 2021-10-07: qty 5

## 2021-10-07 MED ORDER — METOPROLOL TARTRATE 5 MG/5ML IV SOLN
5.0000 mg | Freq: Once | INTRAVENOUS | Status: AC
  Administered 2021-10-07: 16:00:00 5 mg via INTRAVENOUS
  Filled 2021-10-07: qty 5

## 2021-10-07 MED ORDER — ENOXAPARIN SODIUM 40 MG/0.4ML IJ SOSY: 40.0000 mg | PREFILLED_SYRINGE | INTRAMUSCULAR | Status: DC

## 2021-10-07 MED ORDER — ACETAMINOPHEN 650 MG RE SUPP
650.0000 mg | Freq: Four times a day (QID) | RECTAL | Status: DC | PRN
  Filled 2021-10-07: qty 1

## 2021-10-07 MED ORDER — HYDROXYZINE PAMOATE 25 MG PO CAPS
25.0000 mg | ORAL_CAPSULE | Freq: Three times a day (TID) | ORAL | Status: DC | PRN
  Filled 2021-10-07: qty 1

## 2021-10-07 MED ORDER — FUROSEMIDE 10 MG/ML IJ SOLN
40.0000 mg | Freq: Once | INTRAMUSCULAR | Status: AC
  Administered 2021-10-07: 19:00:00 40 mg via INTRAVENOUS
  Filled 2021-10-07: qty 4

## 2021-10-07 MED ORDER — HYDROCOD POLST-CPM POLST ER 10-8 MG/5ML PO SUER
5.0000 mL | Freq: Every day | ORAL | Status: DC
  Administered 2021-10-07 – 2021-10-08 (×2): 5 mL via ORAL
  Filled 2021-10-07 (×2): qty 5

## 2021-10-07 MED ORDER — ONDANSETRON HCL 4 MG PO TABS: 4.0000 mg | ORAL_TABLET | Freq: Four times a day (QID) | ORAL | Status: DC | PRN

## 2021-10-07 MED ORDER — WARFARIN SODIUM 5 MG PO TABS
5.0000 mg | ORAL_TABLET | ORAL | Status: AC
  Administered 2021-10-07: 5 mg via ORAL
  Filled 2021-10-07: qty 1

## 2021-10-07 MED ORDER — WARFARIN SODIUM 3 MG PO TABS
3.0000 mg | ORAL_TABLET | ORAL | Status: DC
  Filled 2021-10-07: qty 1

## 2021-10-07 NOTE — Consult Note (Signed)
ANTICOAGULATION CONSULT NOTE - Initial Consult  Pharmacy Consult for warfarin Indication: atrial fibrillation  Allergies  Allergen Reactions   Prednisone Palpitations    A. fib   Pulmicort [Budesonide] Shortness Of Breath, Itching, Swelling and Other (See Comments)    Felt as if things were crawly on her   Clonidine Other (See Comments)    Pt felt like things were crawling on her arms.   Lotemax [Loteprednol Etabonate]     Broke out around the eye   Morphine And Related Itching and Other (See Comments)    "creepy crawly feeling"   Trovan [Alatrofloxacin Mesylate] Other (See Comments)    "effected the whole system"   Zelnorm [Tegaserod Maleate] Itching    Felt as if things were crawly on her    Erythromycin Rash    Patient Measurements: Height: 5\' 6"  (167.6 cm) Weight: 84 kg (185 lb 3 oz) IBW/kg (Calculated) : 59.3   Vital Signs: Temp: 98.9 F (37.2 C) (12/04 1553) Temp Source: Oral (12/04 1553) BP: 125/86 (12/04 2200) Pulse Rate: 113 (12/04 2200)  Labs: Recent Labs    10/07/21 1553 10/07/21 2030  HGB 12.8  --   HCT 38.6  --   PLT 260  --   LABPROT 31.2*  --   INR 3.0*  --   CREATININE 0.82  --   TROPONINIHS 14 14    Estimated Creatinine Clearance: 58.8 mL/min (by C-G formula based on SCr of 0.82 mg/dL).   Medical History: Past Medical History:  Diagnosis Date   A-fib [facility])    Chronic sinusitis    Fibrocystic breast disease    History of pneumonia 1999   Hyperlipidemia    Irritable bowel syndrome    Lichen planus    lymphoma August 2013   Low grade B cell   Mild tricuspid insufficiency Jan 2012   E[facility], [place]   Moderate mitral insufficiency JAN 2012   [facility], [place]    Medications:  (Not in a hospital admission)   Assessment: Patient admitted with SOB and Atrial fibrillation. Noted ablation performed April/2022. PMH also includes HFpEF, HTN, mitral valve prolapse, PVCs, and recent dx of bronchitis.  Baseline DDI noted amiodarone  Most  recent outpatient warfarin instructions: Warfarin 5mg  x 3 days (11/30-12/2), then alternate 5mg  and 6mg  doses.  Date/time INR  Comment 12/4@1553  3.0  Warfarin 5mg  x1  Goal of Therapy:  INR 2-3 Monitor platelets by anticoagulation protocol: Yes   Plan:  Warfarin 5mg  x 1 NOW INR daily x 3 with AM labs  [name] PharmD, BCPS 10/07/2021 10:51 PM

## 2021-10-07 NOTE — ED Triage Notes (Signed)
Pt come from home, with SOB 97% on room air, pt has hx of afib with hr between 130 to 152, pt had hx of ablasion back in may, pt bp 137/107, pt is on warfarin

## 2021-10-07 NOTE — H&P (Signed)
History and Physical   Carol [name] OIZ:[id number] DOB: [birth year] DOA: 10/07/2021  PCP: Crecencio Mc, MD  Outpatient Specialists: Dr. Stevphen [facility] clinic cardiology Patient coming from: Home  I have personally briefly reviewed patient's old medical records in [place].  Chief Concern: Shortness of breath  HPI: Carol [name] is a 80 y.o. female with medical history significant for hypertension, atrial fibrillation history of ablation in end of May 2022 at [facility] Dr. Lavone Nian, currently on warfarin, history of fibrocystic breast disease, history of pneumonia, hyperlipidemia, IBS, lichen planus, history of lymphoma low-grade B-cell, mild tricuspid insufficiency, moderate mitral insufficiency, who presents emergency department for chief concerns of shortness of breath.  She reports the shortness of breath that started about two weeks ago. She states he shortness of breath is worse with laying down but she i feels short of breath when she was sitting up all night as well.  She went to Dr. Nehemiah Massed, who made changes to her medications on 10/05/2021 and she tried these changes with minimal improvements.  She denies fever, nausea, vomiting. She endorses abdominal pain with cough. She denies dyuria, hematuria, diarrhea. She endorses chronic constipation.  She denies known sick contacts.  Social history: She lives with her husband. She denies tobacco. She infrequently drinks etoh and has not had a drink in one year. She denies recreational drug use. She is retired and was a Educational psychologist.   Vaccination history: She has had three doses of covid 19  ROS: Constitutional: no weight change, no fever ENT/Mouth: no sore throat, no rhinorrhea Eyes: no eye pain, no vision changes Cardiovascular: no chest pain, + dyspnea,  no edema, no palpitations Respiratory: + cough, no sputum, no wheezing Gastrointestinal: no nausea, no vomiting, no diarrhea, no  constipation Genitourinary: no urinary incontinence, no dysuria, no hematuria Musculoskeletal: no arthralgias, no myalgias Skin: no skin lesions, no pruritus, Neuro: + weakness, no loss of consciousness, no syncope Psych: no anxiety, no depression, + decrease appetite Heme/Lymph: no bruising, no bleeding  ED Course: Discussed with emergency medicine provider, patient presents with concerns of atrial fibrillation with RVR.  Vitals in the emergency department was remarkable for temperature of 98.9, respiration rate of 15, and increased to 27, initial heart rate was 162/120, blood pressure 137/109, SPO2 of 97% on room air.  Labs in the emergency department was remarkable for serum sodium 129, potassium 4.0, chloride 96, bicarb 23, BUN of 20, serum creatinine of 0.82, nonfasting blood glucose 167, GFR greater than 60, T bili was 2.0, BNP was elevated at 1585.4, high sensitive troponin initially was 14, INR 3.0, PT is 31.2.  In the emergency department patient received metoprolol 5 mg IVP for total of 3 doses, amiodarone 200 mg p.o. once, furosemide 40 mg IV once followed by an additional dose of furosemide 40 mg IV as patient did not produce any urine for about 1 hour per EDP.  Patient also received an additional dose of metoprolol succinate 100 mg p.o. twice daily.  Assessment/Plan  Principal Problem:   Atrial fibrillation with RVR (HCC) Active Problems:   Lichen planus   Moderate mitral insufficiency   Mild tricuspid insufficiency   Hyperlipidemia   Atrial fibrillation (HCC)   History of breast cancer   S/P TAH-BSO (total abdominal hysterectomy and bilateral salpingo-oophorectomy)   Acquired thrombophilia (HCC)   Hepatic steatosis   Statin intolerance   Hyponatremia   Bronchitis   # Atrial fibrillation with RVR - etiology work-up in progress - Complete echo  ordered - Check procalcitonin, UA, TSH, magnesium, phosphorus - Admit to progressive cardiac, observation, telemetry -  Metoprolol tartrate 5 mg IVP, every hour as needed for heart rate greater than 120, 3 doses ordered - After patient has received metoprolol succinate and we will continue to monitor and if patient's heart rate does not stabilize, the plan is to order diltiazem gtt. for further rate control - Warfarin per pharmacy for A. fib  # Chronic atrial fibrillation-patient takes warfarin, warfarin per pharmacy ordered  # Hyperglycemia-insulin SSI with at bedtime coverage ordered  # Shingles in the right upper quadrant and under her breast - gabapentin helps - She states that she takes gabapentin 100 mg p.o. in the morning and 200 mg nightly and this helps her with the shingles pain  # GERD-PPI  # Anxiety and itching-hydroxyzine 25 mg p.o. every 8 hours as needed for anxiety and itching resumed  Chart reviewed.   Outpatient [facility] clinic cardiology with Dr. Nehemiah Massed for 10/05/2021:  -Increase amiodarone to 200 mg p.o. twice daily (from 200 mg once daily), increase metoprolol 100 mg p.o. daily (from metoprolol succinate 50 mg daily), continue warfarin with no change. -Chronic heart failure with preserved ejection fraction continue Lasix and metoprolol for management  Echo TEE on 01/31/2021: Estimated ejection fraction 55 to 60%, right ventricular systolic function is normal.  Right ventricular size is normal.  DVT prophylaxis: Warfarin Code Status: Full code Diet: Heart healthy Family Communication: no Disposition Plan: Pending clinical course Consults called: no Admission status: Progressive cardiac, observation, telemetry  Past Medical History:  Diagnosis Date   A-fib ([place])    Chronic sinusitis    Fibrocystic breast disease    History of pneumonia 1999   Hyperlipidemia    Irritable bowel syndrome    Lichen planus    lymphoma August 2013   Low grade B cell   Mild tricuspid insufficiency Jan 2012   ECHO, [place]   Moderate mitral insufficiency JAN 2012   ECHO, [place]   Past  Surgical History:  Procedure Laterality Date   ABDOMINAL HYSTERECTOMY     precancerous cervix,     ABLATION OF DYSRHYTHMIC FOCUS  August 2013   [facility], Dr. Marcello Moores   BREAST SURGERY     bilateral, benign biopsies   CARDIOVERSION N/A 11/23/2020   Procedure: CARDIOVERSION;  Surgeon: Corey Skains, MD;  Location: [facility] ORS;  Service: Cardiovascular;  Laterality: N/A;   CARDIOVERSION N/A 01/17/2021   Procedure: CARDIOVERSION;  Surgeon: Corey Skains, MD;  Location: [facility] ORS;  Service: Cardiovascular;  Laterality: N/A;   CARDIOVERSION N/A 01/31/2021   Procedure: CARDIOVERSION;  Surgeon: Corey Skains, MD;  Location: [facility] ORS;  Service: Cardiovascular;  Laterality: N/A;   MAXILLARY SINUS LIFT  1990's   [name]   oophorectomy     squamous cell removal Right    right leg calf area.   TEE WITHOUT CARDIOVERSION N/A 01/31/2021   Procedure: TRANSESOPHAGEAL ECHOCARDIOGRAM (TEE);  Surgeon: Corey Skains, MD;  Location: [facility] ORS;  Service: Cardiovascular;  Laterality: N/A;   Social History:  reports that she has never smoked. She has never used smokeless tobacco. She reports current alcohol use of about 4.0 standard drinks per week. She reports that she does not use drugs.  Allergies  Allergen Reactions   Prednisone Palpitations    A. fib   Pulmicort [Budesonide] Shortness Of Breath, Itching, Swelling and Other (See Comments)    Felt as if things were crawly on her   Clonidine Other (  See Comments)    Pt felt like things were crawling on her arms.   Lotemax [Loteprednol Etabonate]     Broke out around the eye   Morphine And Related Itching and Other (See Comments)    "creepy crawly feeling"   Trovan [Alatrofloxacin Mesylate] Other (See Comments)    "effected the whole system"   Zelnorm [Tegaserod Maleate] Itching    Felt as if things were crawly on her    Erythromycin Rash   Family History  Problem Relation Age of Onset   Cancer Mother        Breast   Hyperlipidemia Father     Heart disease Father    Hypertension Father    Kidney disease Father    Cancer Maternal Grandfather        colon CA   Diverticulitis Sister    Family history: Family history reviewed and not pertinent  Prior to Admission medications   Medication Sig Start Date End Date Taking? Authorizing Provider  acetaminophen (TYLENOL) 500 MG tablet Take 500 mg by mouth every 6 (six) hours as needed for moderate pain or headache.    [provider]  amiodarone (PACERONE) 200 MG tablet Take 1 tablet (200 mg total) by mouth 2 (two) times daily.    [provider]  amoxicillin-clavulanate (AUGMENTIN) 875-125 MG tablet Take 1 tablet by mouth 2 (two) times daily. 09/21/21   Eugenia Pancoast, FNP  b complex vitamins capsule Take 1 capsule by mouth daily.    [provider]  bisacodyl (DULCOLAX) 5 MG EC tablet Take 5 mg by mouth daily as needed for moderate constipation.    [provider]  Cholecalciferol (VITAMIN D3) 125 MCG (5000 UT) TABS Take 5,000 Units by mouth daily.    [provider]  diltiazem (CARDIZEM CD) 120 MG 24 hr capsule Take 1 capsule (120 mg total) by mouth daily. 02/01/21   Caren Griffins, MD  diphenhydrAMINE (BENADRYL) 25 mg capsule Take 25 mg by mouth every 6 (six) hours as needed for allergies.    [provider]  docusate sodium (COLACE) 100 MG capsule Take 100 mg by mouth daily as needed for mild constipation.    [provider]  furosemide (LASIX) 20 MG tablet TAKE 1 TABLET DAILY 07/23/21   Crecencio Mc, MD  gabapentin (NEURONTIN) 100 MG capsule Take 2 capsules (200 mg total) by mouth 3 (three) times daily. Patient taking differently: Take 100 mg by mouth daily. Patient taking 100 mg in the morning and 200 mg at night. 07/23/21   Crecencio Mc, MD  Glucosamine-Chondroit-Vit C-Mn (GLUCOSAMINE 1500 COMPLEX PO) Take 1,500 mg by mouth daily.    [provider]  Glycerin-Polysorbate 80 (REFRESH DRY EYE THERAPY OP)  Place 1 drop into both eyes 2 (two) times daily.    [provider]  hydrOXYzine (VISTARIL) 25 MG capsule Take 1 capsule (25 mg total) by mouth every 8 (eight) hours as needed. 03/29/21   Crecencio Mc, MD  magnesium oxide (MAG-OX) 400 MG tablet Take 400 mg by mouth in the morning and at bedtime.    [provider]  metoprolol succinate (TOPROL-XL) 100 MG 24 hr tablet Take 1 tablet (100 mg total) by mouth 2 (two) times daily. Take with or immediately following a meal. 02/01/21   Gherghe, Vella Redhead, MD  Multiple Vitamin (MULTIVITAMIN WITH MINERALS) TABS tablet Take 0.5 tablets by mouth 2 (two) times daily.    [provider]  Omega-3 Fatty Acids (  FISH OIL) 1000 MG CAPS Take 2,000 mg by mouth in the morning, at noon, and at bedtime.    [provider]  omeprazole (PRILOSEC) 20 MG capsule Take 1 capsule (20 mg total) by mouth daily. 02/21/21   Crecencio Mc, MD  vitamin E 1000 UNIT capsule Take 1,000 Units by mouth 2 (two) times daily.    [provider]  warfarin (COUMADIN) 3 MG tablet Take 1.5-3 mg by mouth See admin instructions. Take 1.5 mg Fridays and Sundays and 3 mg other days of the week. (Takes at night) ((Patient is taking 6 mg as of 09/21/2021 for 2 weeks then has blood work.))    [provider]  levalbuterol (XOPENEX HFA) 45 MCG/ACT inhaler Inhale 1-2 puffs into the lungs every 4 (four) hours as needed for wheezing. 08/13/12 08/13/12  Crecencio Mc, MD   Physical Exam: Vitals:   10/07/21 1800 10/07/21 1830 10/07/21 1900 10/07/21 1930  BP: (!) 113/98 (!) 127/95 (!) 113/97 (!) 118/104  Pulse: (!) 135 (!) 118 (!) 119 (!) 121  Resp: 17 19 (!) 25 (!) 26  Temp:      TempSrc:      SpO2: 97% 97% 99% 99%  Weight:      Height:       Constitutional: appears age-appropriate, NAD, calm, comfortable Eyes: PERRL, lids and conjunctivae normal ENMT: Mucous membranes are moist. Posterior pharynx clear of any exudate or lesions. Age-appropriate  dentition. Hearing appropriate Neck: normal, supple, no masses, no thyromegaly Respiratory: clear to auscultation bilaterally, no wheezing, no crackles. Normal respiratory effort. No accessory muscle use.  Cardiovascular: Regular rate and rhythm, no murmurs / rubs / gallops. No extremity edema. 2+ pedal pulses. No carotid bruits.  Abdomen: obese abdomen,+ tenderness at shingles, no masses palpated, no hepatosplenomegaly. Bowel sounds positive.  Musculoskeletal: no clubbing / cyanosis. No joint deformity upper and lower extremities. Good ROM, no contractures, no atrophy. Normal muscle tone.  Skin: Shingles + rashes, lesions. Skin changes in the right lower abdomen Neurologic: Sensation intact. Strength 5/5 in all 4. Bilateral varicose veins of the lower extremity Psychiatric: Normal judgment and insight. Alert and oriented x 3. Normal mood.   EKG: Ordered  Chest x-ray on Admission: I personally reviewed and I agree with radiologist reading as below.  DG Chest Portable 1 View  Result Date: 10/07/2021 CLINICAL DATA:  Shortness of breath. EXAM: PORTABLE CHEST 1 VIEW COMPARISON:  January 29, 2021 FINDINGS: Injectable port in stable position. Calcific atherosclerotic disease of the aorta. Enlarged cardiac silhouette. Small bilateral pleural effusions. Mild interstitial pulmonary edema. IMPRESSION: Enlarged cardiac silhouette with mild interstitial pulmonary edema and small bilateral pleural effusions. Electronically Signed   By: Fidela Salisbury M.D.   On: 10/07/2021 16:58   US ABDOMEN LIMITED RUQ (LIVER/GB)  Result Date: 10/07/2021 CLINICAL DATA:  Hyperbilirubinemia. EXAM: ULTRASOUND ABDOMEN LIMITED RIGHT UPPER QUADRANT COMPARISON:  July 21, 2017 FINDINGS: Gallbladder: No gallstones or wall thickening visualized. No sonographic Murphy sign noted by sonographer. Common bile duct: Diameter: 3 mm Liver: No focal lesion identified. Within normal limits in parenchymal echogenicity. Portal vein is  patent on color Doppler imaging with normal direction of blood flow towards the liver. Other: Right pleural effusion. IMPRESSION: 1. Unremarkable sonographic appearance of the liver and gallbladder. No biliary ductal dilatation. 2. Right pleural effusion. Electronically Signed   By: Dahlia Bailiff M.D.   On: 10/07/2021 19:42    Labs on Admission: I have personally reviewed following labs  CBC: Recent Labs  Lab 10/07/21 1553  WBC 10.8*  NEUTROABS 8.1*  HGB 12.8  HCT 38.6  MCV 100.0  PLT 616   Basic Metabolic Panel: Recent Labs  Lab 10/07/21 1553  NA 129*  K 4.0  CL 96*  CO2 23  GLUCOSE 167*  BUN 20  CREATININE 0.82  CALCIUM 8.8*   GFR: Estimated Creatinine Clearance: 58.8 mL/min (by C-G formula based on SCr of 0.82 mg/dL).  Liver Function Tests: Recent Labs  Lab 10/07/21 1553  AST 84*  ALT 126*  ALKPHOS 87  BILITOT 2.0*  PROT 6.7  ALBUMIN 3.8   Coagulation Profile: Recent Labs  Lab 10/07/21 1553  INR 3.0*   Urine analysis:    Component Value Date/Time   COLORURINE YELLOW (A) 01/30/2021 0510   APPEARANCEUR CLEAR (A) 01/30/2021 0510   LABSPEC 1.005 01/30/2021 0510   PHURINE 6.0 01/30/2021 0510   GLUCOSEU NEGATIVE 01/30/2021 0510   HGBUR NEGATIVE 01/30/2021 0510   BILIRUBINUR NEGATIVE 01/30/2021 0510   KETONESUR NEGATIVE 01/30/2021 0510   PROTEINUR NEGATIVE 01/30/2021 0510   NITRITE NEGATIVE 01/30/2021 0510   LEUKOCYTESUR NEGATIVE 01/30/2021 0510   Dr. Tobie Poet [facility]  If 7PM-7AM, please contact overnight-coverage provider If 7AM-7PM, please contact day coverage provider [email or web address]  10/07/2021, 9:12 PM

## 2021-10-07 NOTE — ED Provider Notes (Signed)
[facility] Department Provider Note  ____________________________________________   Event Date/Time   First MD Initiated Contact with Patient 10/07/21 1543     (approximate)  I have reviewed the triage vital signs and the nursing notes.   HISTORY  Chief Complaint Shortness of Breath and Atrial Fibrillation   HPI Carol [name] is a 80 y.o. female with a past medical history of paroxysmal atrial fibrillation status post ablation in April 2022 on Coumadin, amiodarone and metoprolol having recently increased metoprolol dose to 100 mg twice daily from 50 mg twice daily on 12/2, chronic heart failure with preserved ejection fraction, hypertension, mitral valve prolapse, frequent PVCs and recent diagnosis of bronchitis who presents for assessment of shortness of breath and palpitations.  Patient states she had a cough last week that seem to have improved but she feels her shortness of breath is gotten worse over the last couple days.  She feels she has also had worsening palpitations of the last couple days.  She denies any fevers, acute chest pain, acute abdominal pain, nausea, vomiting, diarrhea, burning with urination, headache, earache, sore throat, rash or extremity pain.  No recent falls or injuries.  She cannot specify any acute weight gain but think she has been gaining weight over the last couple of weeks or months.  He does endorse orthopnea.         Past Medical History:  Diagnosis Date   A-fib [facility])    Chronic sinusitis    Fibrocystic breast disease    History of pneumonia 1999   Hyperlipidemia    Irritable bowel syndrome    Lichen planus    lymphoma August 2013   Low grade B cell   Mild tricuspid insufficiency Jan 2012   ECHO, [place]   Moderate mitral insufficiency JAN 2012   ECHO, [place]    Patient Active Problem List   Diagnosis Date Noted   Bronchitis 09/21/2021   Shingles (herpes zoster) polyneuropathy  03/21/2021   Leg edema 01/29/2021   Hyponatremia 01/29/2021   Acute cough 10/31/2020   Ill-defined cerebrovascular disease 10/12/2020   Statin intolerance 01/08/2018   Hepatic steatosis 07/23/2017   Neck pain 03/28/2017   Encounter for preventive health examination 11/30/2015   Acquired thrombophilia ([place]) 05/16/2015   Screening for colon cancer 11/07/2014   S/P TAH-BSO (total abdominal hysterectomy and bilateral salpingo-oophorectomy) 11/07/2014   History of breast cancer 06/06/2014   Medicare annual wellness visit, subsequent 05/17/2014   Lymphoma of lymph nodes of head, face, and/or neck ([place]) 08/15/2012   Malignant neoplasm of orbit ([place]) 08/12/2012   Atrial fibrillation ([place]) 89/38/1017   Lichen planus    Irritable bowel syndrome    Moderate mitral insufficiency    Mild tricuspid insufficiency    Hyperlipidemia     Past Surgical History:  Procedure Laterality Date   ABDOMINAL HYSTERECTOMY     precancerous cervix,     ABLATION OF DYSRHYTHMIC FOCUS  August 2013   [facility], Dr. Marcello Moores   BREAST SURGERY     bilateral, benign biopsies   CARDIOVERSION N/A 11/23/2020   Procedure: CARDIOVERSION;  Surgeon: Corey Skains, MD;  Location: [facility];  Service: Cardiovascular;  Laterality: N/A;   CARDIOVERSION N/A 01/17/2021   Procedure: CARDIOVERSION;  Surgeon: Corey Skains, MD;  Location: [facility];  Service: Cardiovascular;  Laterality: N/A;   CARDIOVERSION N/A 01/31/2021   Procedure: CARDIOVERSION;  Surgeon: Corey Skains, MD;  Location: [facility];  Service: Cardiovascular;  Laterality: N/A;  MAXILLARY SINUS LIFT  1990's   [name]   oophorectomy     squamous cell removal Right    right leg calf area.   TEE WITHOUT CARDIOVERSION N/A 01/31/2021   Procedure: TRANSESOPHAGEAL ECHOCARDIOGRAM (TEE);  Surgeon: Corey Skains, MD;  Location: [facility];  Service: Cardiovascular;  Laterality: N/A;    Prior to Admission medications   Medication Sig Start Date End Date Taking?  Authorizing Provider  acetaminophen (TYLENOL) 500 MG tablet Take 500 mg by mouth every 6 (six) hours as needed for moderate pain or headache.    [provider]  amiodarone (PACERONE) 200 MG tablet Take 1 tablet (200 mg total) by mouth 2 (two) times daily.    [provider]  amoxicillin-clavulanate (AUGMENTIN) 875-125 MG tablet Take 1 tablet by mouth 2 (two) times daily. 09/21/21   Eugenia Pancoast, FNP  b complex vitamins capsule Take 1 capsule by mouth daily.    [provider]  bisacodyl (DULCOLAX) 5 MG EC tablet Take 5 mg by mouth daily as needed for moderate constipation.    [provider]  Cholecalciferol (VITAMIN D3) 125 MCG (5000 UT) TABS Take 5,000 Units by mouth daily.    [provider]  diltiazem (CARDIZEM CD) 120 MG 24 hr capsule Take 1 capsule (120 mg total) by mouth daily. 02/01/21   Caren Griffins, MD  diphenhydrAMINE (BENADRYL) 25 mg capsule Take 25 mg by mouth every 6 (six) hours as needed for allergies.    [provider]  docusate sodium (COLACE) 100 MG capsule Take 100 mg by mouth daily as needed for mild constipation.    [provider]  furosemide (LASIX) 20 MG tablet TAKE 1 TABLET DAILY 07/23/21   Crecencio Mc, MD  gabapentin (NEURONTIN) 100 MG capsule Take 2 capsules (200 mg total) by mouth 3 (three) times daily. Patient taking differently: Take 100 mg by mouth daily. Patient taking 100 mg in the morning and 200 mg at night. 07/23/21   Crecencio Mc, MD  Glucosamine-Chondroit-Vit C-Mn (GLUCOSAMINE 1500 COMPLEX PO) Take 1,500 mg by mouth daily.    [provider]  Glycerin-Polysorbate 80 (REFRESH DRY EYE THERAPY OP) Place 1 drop into both eyes 2 (two) times daily.    [provider]  hydrOXYzine (VISTARIL) 25 MG capsule Take 1 capsule (25 mg total) by mouth every 8 (eight) hours as needed. 03/29/21   Crecencio Mc, MD  magnesium oxide (MAG-OX) 400 MG tablet Take 400 mg by mouth in the  morning and at bedtime.    [provider]  metoprolol succinate (TOPROL-XL) 100 MG 24 hr tablet Take 1 tablet (100 mg total) by mouth 2 (two) times daily. Take with or immediately following a meal. 02/01/21   Gherghe, Vella Redhead, MD  Multiple Vitamin (MULTIVITAMIN WITH MINERALS) TABS tablet Take 0.5 tablets by mouth 2 (two) times daily.    [provider]  Omega-3 Fatty Acids (FISH OIL) 1000 MG CAPS Take 2,000 mg by mouth in the morning, at noon, and at bedtime.    [provider]  omeprazole (PRILOSEC) 20 MG capsule Take 1 capsule (20 mg total) by mouth daily. 02/21/21   Crecencio Mc, MD  vitamin E 1000 UNIT capsule Take 1,000 Units by mouth 2 (two) times daily.    [provider]  warfarin (COUMADIN) 3 MG tablet Take 1.5-3 mg by mouth See admin instructions. Take 1.5 mg Fridays and Sundays and 3 mg other days of the week. (Takes  at night) ((Patient is taking 6 mg as of 09/21/2021 for 2 weeks then has blood work.))    [provider]  levalbuterol (XOPENEX HFA) 45 MCG/ACT inhaler Inhale 1-2 puffs into the lungs every 4 (four) hours as needed for wheezing. 08/13/12 08/13/12  Crecencio Mc, MD    Allergies Prednisone, Pulmicort [budesonide], Clonidine, Lotemax [loteprednol etabonate], Morphine and related, Trovan [alatrofloxacin mesylate], Zelnorm [tegaserod maleate], and Erythromycin  Family History  Problem Relation Age of Onset   Cancer Mother        Breast   Hyperlipidemia Father    Heart disease Father    Hypertension Father    Kidney disease Father    Cancer Maternal Grandfather        colon CA   Diverticulitis Sister     Social History Social History   Tobacco Use   Smoking status: Never   Smokeless tobacco: Never  Substance Use Topics   Alcohol use: Yes    Alcohol/week: 4.0 standard drinks    Types: 4 Glasses of wine per week    Comment: Occ.   Drug use: No    Review of Systems  Review of Systems  Constitutional:   Negative for chills and fever.  HENT:  Negative for sore throat.   Eyes:  Negative for pain.  Respiratory:  Positive for cough (improved from last week but lingering) and shortness of breath. Negative for stridor.   Cardiovascular:  Positive for orthopnea. Negative for chest pain.  Gastrointestinal:  Negative for vomiting.  Skin:  Negative for rash.  Neurological:  Negative for seizures, loss of consciousness and headaches.  Psychiatric/Behavioral:  Negative for suicidal ideas.   All other systems reviewed and are negative.    ____________________________________________   PHYSICAL EXAM:  VITAL SIGNS: ED Triage Vitals  Enc Vitals Group     BP      Pulse      Resp      Temp      Temp src      SpO2      Weight      Height      Head Circumference      Peak Flow      Pain Score      Pain Loc      Pain Edu?      Excl. in [place]?    Vitals:   10/07/21 1830 10/07/21 1900  BP: (!) 127/95 (!) 113/97  Pulse: (!) 118 (!) 119  Resp: 19 (!) 25  Temp:    SpO2: 97% 99%   Physical Exam Vitals and nursing note reviewed.  Constitutional:      General: She is not in acute distress.    Appearance: She is well-developed.  HENT:     Head: Normocephalic and atraumatic.     Right Ear: External ear normal.     Left Ear: External ear normal.     Nose: Nose normal.  Eyes:     Conjunctiva/sclera: Conjunctivae normal.  Cardiovascular:     Rate and Rhythm: Tachycardia present. Rhythm irregular.     Heart sounds: No murmur heard. Pulmonary:     Effort: Pulmonary effort is normal. Tachypnea present. No respiratory distress.     Breath sounds: Decreased breath sounds and rales present.  Abdominal:     Palpations: Abdomen is soft.     Tenderness: There is no abdominal tenderness.  Musculoskeletal:        General: No swelling.     Cervical back: Neck  supple.  Skin:    General: Skin is warm and dry.     Capillary Refill: Capillary refill takes less than 2 seconds.  Neurological:      Mental Status: She is alert and oriented to person, place, and time.  Psychiatric:        Mood and Affect: Mood normal.     ____________________________________________   LABS (all labs ordered are listed, but only abnormal results are displayed)  Labs Reviewed  CBC WITH DIFFERENTIAL/PLATELET - Abnormal; Notable for the following components:      Result Value   WBC 10.8 (*)    RBC 3.86 (*)    RDW 15.8 (*)    Neutro Abs 8.1 (*)    All other components within normal limits  COMPREHENSIVE METABOLIC PANEL - Abnormal; Notable for the following components:   Sodium 129 (*)    Chloride 96 (*)    Glucose, Bld 167 (*)    Calcium 8.8 (*)    AST 84 (*)    ALT 126 (*)    Total Bilirubin 2.0 (*)    All other components within normal limits  PROTIME-INR - Abnormal; Notable for the following components:   Prothrombin Time 31.2 (*)    INR 3.0 (*)    All other components within normal limits  BRAIN NATRIURETIC PEPTIDE - Abnormal; Notable for the following components:   B Natriuretic Peptide 1,585.4 (*)    All other components within normal limits  RESP PANEL BY RT-PCR (FLU A&B, COVID) ARPGX2  TSH  TROPONIN I (HIGH SENSITIVITY)  TROPONIN I (HIGH SENSITIVITY)   ____________________________________________  EKG  A. fib with a ventricular rate of 155 and some nonspecific ST changes in V2 without other clearance of acute ischemia or significant arrhythmia.  QTC is 511. ____________________________________________  RADIOLOGY  ED MD interpretation: Chest x-ray shows cardiomegaly and mild edema as well as small bilateral pleural effusions.  There is no clear focal consolidation, pneumothorax or other clear acute thoracic process.   Official radiology report(s): DG Chest Portable 1 View  Result Date: 10/07/2021 CLINICAL DATA:  Shortness of breath. EXAM: PORTABLE CHEST 1 VIEW COMPARISON:  January 29, 2021 FINDINGS: Injectable port in stable position. Calcific atherosclerotic disease of the  aorta. Enlarged cardiac silhouette. Small bilateral pleural effusions. Mild interstitial pulmonary edema. IMPRESSION: Enlarged cardiac silhouette with mild interstitial pulmonary edema and small bilateral pleural effusions. Electronically Signed   By: Fidela Salisbury M.D.   On: 10/07/2021 16:58    ____________________________________________   PROCEDURES  Procedure(s) performed (including Critical Care):  .Critical Care Performed by: Lucrezia Starch, MD Authorized by: Lucrezia Starch, MD   Critical care provider statement:    Critical care time (minutes):  30   Critical care was necessary to treat or prevent imminent or life-threatening deterioration of the following conditions:  Cardiac failure and respiratory failure   Critical care was time spent personally by me on the following activities:  Development of treatment plan with patient or surrogate, discussions with consultants, evaluation of patient's response to treatment, examination of patient, ordering and review of laboratory studies, ordering and review of radiographic studies, ordering and performing treatments and interventions, pulse oximetry, re-evaluation of patient's condition and review of old charts   ____________________________________________   INITIAL IMPRESSION / [place] / ED COURSE      Patient presents with above to history exam for assessment of couple days of worsening shortness of breath and palpitations in the setting of recently getting over bronchitis  last week.  No other clear associated sick symptoms.  On arrival she is tachycardic with heart rates in the 150s to 160s little bit tachypneic with otherwise stable vital signs on room air.  Differential includes acute CHF possibly precipitated by her RVR, other significant arrhythmia, ACS, PE, bronchitis, pneumonia, anemia and metabolic derangements.  A. fib with a ventricular rate of 155 and some nonspecific ST changes in V2 without other  clearance of acute ischemia or significant arrhythmia.  QTC is 511.  Troponin is 14 and not suggestive of ACS.  INR is therapeutic at 3 and thus I have low suspicion for PE at this time.  CBC shows WBC count of 10.8 without evidence of acute anemia and normal platelets.  BNP is elevated at 1585.  This supports the findings on x-ray noted below concerning for acute CHF.  CMP remarkable for some mild hyponatremia with a sodium of 129, glucose of 167 and mild transaminitis with an AST of 84 and a LT of 127.  Alk phos is 87 and overall patient has no abdominal pain or tenderness on exam to suggest an acute cholestatic process or acute hepatitis and I suspect this represents some vascular congestion.  T bili is slightly elevated as well at 2 and I will follow this up with a right upper quadrant ultrasound.   Chest x-ray shows cardiomegaly and mild edema as well as small bilateral pleural effusions.  There is no clear focal consolidation, pneumothorax or other clear acute thoracic process.   Initially patient treated with some IV doses of metoprolol which he seem to respond well to as well as 40 mg of Lasix that she takes 20 per chart review at home.  She required couple additional doses of IV metoprolol and while she had steadily improvements of her heart rate over several hours emergency room I will give an additional 40 mg as she has not yet been after 3 hours.  Her heart rate on several rechecks is now down to the 120s and no believe she requires diltiazem or esmolol drip.  Her SPO2 briefly dipped down to 89% and she was placed on 2 L nasal cannula with improvement back to 99%.  I believe she will continue to improve from RVR perspective with diuresis and has her home metoprolol that I reordered kicks in as well as the amiodarone.   I will admit to medicine service for further evaluation and management.       ____________________________________________   FINAL CLINICAL IMPRESSION(S) / ED  DIAGNOSES  Final diagnoses:  Bilirubinemia  Atrial fibrillation with RVR (HCC)  Acute on chronic congestive heart failure, unspecified heart failure type (HCC)  Acute respiratory failure with hypoxia (HCC)    Medications  metoprolol succinate (TOPROL-XL) 24 hr tablet 100 mg (100 mg Oral Given 10/07/21 1707)  metoprolol tartrate (LOPRESSOR) injection 5 mg (5 mg Intravenous Given 10/07/21 1615)  furosemide (LASIX) injection 40 mg (40 mg Intravenous Given 10/07/21 1728)  metoprolol tartrate (LOPRESSOR) injection 5 mg (5 mg Intravenous Given 10/07/21 1743)  metoprolol tartrate (LOPRESSOR) injection 5 mg (5 mg Intravenous Given 10/07/21 1831)  amiodarone (PACERONE) tablet 200 mg (200 mg Oral Given 10/07/21 1855)  furosemide (LASIX) injection 40 mg (40 mg Intravenous Given 10/07/21 1855)     ED Discharge Orders     None        Note:  This document was prepared using Dragon voice recognition software and may include unintentional dictation errors.    Lucrezia Starch, MD  10/07/21 1933

## 2021-10-08 ENCOUNTER — Observation Stay
Admit: 2021-10-08 | Discharge: 2021-10-08 | Disposition: A | Discharge status: Home / Self Care | Payer: Medicare Other | Attending: Internal Medicine | Admitting: Internal Medicine

## 2021-10-08 DIAGNOSIS — I110 Hypertensive heart disease with heart failure: Secondary | ICD-10-CM | POA: Diagnosis present

## 2021-10-08 DIAGNOSIS — D6859 Other primary thrombophilia: Secondary | ICD-10-CM | POA: Diagnosis present

## 2021-10-08 DIAGNOSIS — E871 Hypo-osmolality and hyponatremia: Secondary | ICD-10-CM | POA: Diagnosis present

## 2021-10-08 DIAGNOSIS — J9601 Acute respiratory failure with hypoxia: Secondary | ICD-10-CM | POA: Diagnosis present

## 2021-10-08 DIAGNOSIS — K219 Gastro-esophageal reflux disease without esophagitis: Secondary | ICD-10-CM | POA: Diagnosis present

## 2021-10-08 DIAGNOSIS — I081 Rheumatic disorders of both mitral and tricuspid valves: Secondary | ICD-10-CM | POA: Diagnosis present

## 2021-10-08 DIAGNOSIS — I341 Nonrheumatic mitral (valve) prolapse: Secondary | ICD-10-CM | POA: Diagnosis present

## 2021-10-08 DIAGNOSIS — E785 Hyperlipidemia, unspecified: Secondary | ICD-10-CM | POA: Diagnosis present

## 2021-10-08 DIAGNOSIS — I4891 Unspecified atrial fibrillation: Secondary | ICD-10-CM | POA: Diagnosis present

## 2021-10-08 DIAGNOSIS — Z79899 Other long term (current) drug therapy: Secondary | ICD-10-CM | POA: Diagnosis not present

## 2021-10-08 DIAGNOSIS — F419 Anxiety disorder, unspecified: Secondary | ICD-10-CM | POA: Diagnosis present

## 2021-10-08 DIAGNOSIS — I5023 Acute on chronic systolic (congestive) heart failure: Secondary | ICD-10-CM | POA: Diagnosis present

## 2021-10-08 DIAGNOSIS — K581 Irritable bowel syndrome with constipation: Secondary | ICD-10-CM | POA: Diagnosis present

## 2021-10-08 DIAGNOSIS — I480 Paroxysmal atrial fibrillation: Secondary | ICD-10-CM | POA: Diagnosis present

## 2021-10-08 DIAGNOSIS — Z20822 Contact with and (suspected) exposure to covid-19: Secondary | ICD-10-CM | POA: Diagnosis present

## 2021-10-08 DIAGNOSIS — I429 Cardiomyopathy, unspecified: Secondary | ICD-10-CM | POA: Diagnosis present

## 2021-10-08 DIAGNOSIS — N6019 Diffuse cystic mastopathy of unspecified breast: Secondary | ICD-10-CM | POA: Diagnosis present

## 2021-10-08 DIAGNOSIS — C8511 Unspecified B-cell lymphoma, lymph nodes of head, face, and neck: Secondary | ICD-10-CM | POA: Diagnosis present

## 2021-10-08 DIAGNOSIS — L439 Lichen planus, unspecified: Secondary | ICD-10-CM | POA: Diagnosis present

## 2021-10-08 DIAGNOSIS — I493 Ventricular premature depolarization: Secondary | ICD-10-CM | POA: Diagnosis present

## 2021-10-08 DIAGNOSIS — B029 Zoster without complications: Secondary | ICD-10-CM | POA: Diagnosis present

## 2021-10-08 DIAGNOSIS — Z7901 Long term (current) use of anticoagulants: Secondary | ICD-10-CM | POA: Diagnosis not present

## 2021-10-08 DIAGNOSIS — Z853 Personal history of malignant neoplasm of breast: Secondary | ICD-10-CM | POA: Diagnosis not present

## 2021-10-08 DIAGNOSIS — K760 Fatty (change of) liver, not elsewhere classified: Secondary | ICD-10-CM | POA: Diagnosis present

## 2021-10-08 DIAGNOSIS — R739 Hyperglycemia, unspecified: Secondary | ICD-10-CM | POA: Diagnosis present

## 2021-10-08 LAB — GLUCOSE, CAPILLARY
Glucose-Capillary: 121 mg/dL — ABNORMAL HIGH (ref 70–99)
Glucose-Capillary: 129 mg/dL — ABNORMAL HIGH (ref 70–99)

## 2021-10-08 LAB — HEMOGLOBIN A1C
Hgb A1c MFr Bld: 5.7 % — ABNORMAL HIGH (ref 4.8–5.6)
Hgb A1c MFr Bld: 5.7 % — ABNORMAL HIGH (ref 4.8–5.6)
Mean Plasma Glucose: 117 mg/dL
Mean Plasma Glucose: 117 mg/dL

## 2021-10-08 LAB — ECHOCARDIOGRAM COMPLETE
AR max vel: 1.65 cm2
AV Area VTI: 1.78 cm2
AV Area mean vel: 1.55 cm2
AV Mean grad: 1 mmHg
AV Peak grad: 2.1 mmHg
Ao pk vel: 0.73 m/s
Area-P 1/2: 4.99 cm2
Height: 66 in
S' Lateral: 4.1 cm
Weight: 2962.98 oz

## 2021-10-08 LAB — CBG MONITORING, ED
Glucose-Capillary: 91 mg/dL (ref 70–99)
Glucose-Capillary: 118 mg/dL — ABNORMAL HIGH (ref 70–99)
Glucose-Capillary: 181 mg/dL — ABNORMAL HIGH (ref 70–99)

## 2021-10-08 LAB — URINALYSIS, COMPLETE (UACMP) WITH MICROSCOPIC
Bilirubin Urine: NEGATIVE
Glucose, UA: NEGATIVE mg/dL
Ketones, ur: NEGATIVE mg/dL
Nitrite: NEGATIVE
Protein, ur: NEGATIVE mg/dL
Specific Gravity, Urine: 1.02 (ref 1.005–1.030)
pH: 6 (ref 5.0–8.0)

## 2021-10-08 LAB — CBC
HCT: 38.3 % (ref 36.0–46.0)
Hemoglobin: 13 g/dL (ref 12.0–15.0)
MCH: 33 pg (ref 26.0–34.0)
MCHC: 33.9 g/dL (ref 30.0–36.0)
MCV: 97.2 fL (ref 80.0–100.0)
Platelets: 248 10*3/uL (ref 150–400)
RBC: 3.94 MIL/uL (ref 3.87–5.11)
RDW: 15.6 % — ABNORMAL HIGH (ref 11.5–15.5)
WBC: 9.6 10*3/uL (ref 4.0–10.5)
nRBC: 0 % (ref 0.0–0.2)

## 2021-10-08 LAB — BASIC METABOLIC PANEL
Anion gap: 8 (ref 5–15)
BUN: 21 mg/dL (ref 8–23)
CO2: 27 mmol/L (ref 22–32)
Calcium: 8.6 mg/dL — ABNORMAL LOW (ref 8.9–10.3)
Chloride: 99 mmol/L (ref 98–111)
Creatinine, Ser: 0.94 mg/dL (ref 0.44–1.00)
GFR, Estimated: >60 mL/min (ref >60)
Glucose, Bld: 115 mg/dL — ABNORMAL HIGH (ref 70–99)
Potassium: 3.6 mmol/L (ref 3.5–5.1)
Sodium: 134 mmol/L — ABNORMAL LOW (ref 135–145)

## 2021-10-08 LAB — PROTIME-INR
INR: 2.9 — ABNORMAL HIGH (ref 0.8–1.2)
Prothrombin Time: 30.2 seconds — ABNORMAL HIGH (ref 11.4–15.2)

## 2021-10-08 MED ORDER — ADULT MULTIVITAMIN W/MINERALS CH
0.5000 | ORAL_TABLET | Freq: Two times a day (BID) | ORAL | Status: DC
  Administered 2021-10-08 (×2): 0.5 via ORAL
  Filled 2021-10-08 (×2): qty 1

## 2021-10-08 MED ORDER — INFLUENZA VAC A&B SA ADJ QUAD 0.5 ML IM PRSY: 0.5000 mL | PREFILLED_SYRINGE | INTRAMUSCULAR | Status: DC

## 2021-10-08 MED ORDER — AMIODARONE HCL 100 MG PO TABS
200.0000 mg | ORAL_TABLET | Freq: Two times a day (BID) | ORAL | Status: DC
  Administered 2021-10-08 – 2021-10-09 (×3): 200 mg via ORAL
  Filled 2021-10-08: qty 2
  Filled 2021-10-08: qty 1
  Filled 2021-10-08 (×3): qty 2
  Filled 2021-10-08: qty 1

## 2021-10-08 MED ORDER — FUROSEMIDE 10 MG/ML IJ SOLN
20.0000 mg | Freq: Two times a day (BID) | INTRAMUSCULAR | Status: DC
  Administered 2021-10-08: 20 mg via INTRAVENOUS
  Filled 2021-10-08: qty 2

## 2021-10-08 MED ORDER — WARFARIN SODIUM 5 MG PO TABS
5.0000 mg | ORAL_TABLET | Freq: Once | ORAL | Status: AC
  Administered 2021-10-08: 5 mg via ORAL
  Filled 2021-10-08: qty 1

## 2021-10-08 MED ORDER — DILTIAZEM HCL ER COATED BEADS 120 MG PO CP24
120.0000 mg | ORAL_CAPSULE | Freq: Every day | ORAL | Status: DC
  Filled 2021-10-08: qty 1

## 2021-10-08 MED ORDER — DILTIAZEM HCL-DEXTROSE 125-5 MG/125ML-% IV SOLN (PREMIX)
5.0000 mg/h | INTRAVENOUS | Status: DC
  Administered 2021-10-08: 5 mg/h via INTRAVENOUS
  Administered 2021-10-08: 12.5 mg/h via INTRAVENOUS
  Administered 2021-10-08: 10 mg/h via INTRAVENOUS
  Administered 2021-10-08: 7.5 mg/h via INTRAVENOUS
  Administered 2021-10-09: 12.5 mg/h via INTRAVENOUS
  Filled 2021-10-08 (×2): qty 125

## 2021-10-08 MED ORDER — DILTIAZEM HCL 25 MG/5ML IV SOLN: 15.0000 mg | Freq: Once | INTRAVENOUS | Status: DC

## 2021-10-08 MED ORDER — DILTIAZEM LOAD VIA INFUSION
15.0000 mg | Freq: Once | INTRAVENOUS | Status: AC
  Administered 2021-10-08: 15 mg via INTRAVENOUS
  Filled 2021-10-08: qty 15

## 2021-10-08 MED ORDER — VITAMIN D3 25 MCG (1000 UNIT) PO TABS
5000.0000 [IU] | ORAL_TABLET | Freq: Every day | ORAL | Status: DC
Start: 2021-10-08 — End: 2021-10-09
  Administered 2021-10-08: 5000 [IU] via ORAL
  Filled 2021-10-08 (×3): qty 5

## 2021-10-08 MED ORDER — DILTIAZEM HCL ER COATED BEADS 120 MG PO CP24: 240.0000 mg | ORAL_CAPSULE | Freq: Every day | ORAL | Status: DC

## 2021-10-08 NOTE — Progress Notes (Addendum)
[facility] NAME: Carol [name]    MR#:  [id number]  DATE OF BIRTH:  04-26-1941  SUBJECTIVE:  patient came in with increasing shortness of breath and leg swelling. She said she may have taken a little bit of extra salt food over Thanksgiving. Remains in a fib heart rate in the 100s intermittently jumps to 125. Shortness of breath. Received Lasix yesterday. Husband at bedside. Patient denies chest  REVIEW OF SYSTEMS:   Review of Systems  Constitutional:  Negative for chills, fever and weight loss.  HENT:  Negative for ear discharge, ear pain and nosebleeds.   Eyes:  Negative for blurred vision, pain and discharge.  Respiratory:  Positive for shortness of breath. Negative for sputum production, wheezing and stridor.   Cardiovascular:  Positive for palpitations and leg swelling. Negative for chest pain, orthopnea and PND.  Gastrointestinal:  Negative for abdominal pain, diarrhea, nausea and vomiting.  Genitourinary:  Negative for frequency and urgency.  Musculoskeletal:  Negative for back pain and joint pain.  Neurological:  Positive for weakness. Negative for sensory change, speech change and focal weakness.  Psychiatric/Behavioral:  Negative for depression and hallucinations. The patient is not nervous/anxious.   Tolerating Diet: Tolerating PT:   DRUG ALLERGIES:   Allergies  Allergen Reactions   Prednisone Palpitations    A. fib   Pulmicort [Budesonide] Shortness Of Breath, Itching, Swelling and Other (See Comments)    Felt as if things were crawly on her   Clonidine Other (See Comments)    Pt felt like things were crawling on her arms.   Lotemax [Loteprednol Etabonate]     Broke out around the eye   Morphine And Related Itching and Other (See Comments)    "creepy crawly feeling"   Trovan [Alatrofloxacin Mesylate] Other (See Comments)    "effected the whole system"   Zelnorm [Tegaserod Maleate] Itching    Felt as if things  were crawly on her    Erythromycin Rash    VITALS:  Blood pressure 100/78, pulse (!) 120, temperature 97.7 F (36.5 C), resp. rate 20, height 5\' 6"  (1.676 m), weight 83.5 kg, SpO2 96 %.  PHYSICAL EXAMINATION:   Physical Exam  GENERAL:  80 y.o.-year-old patient lying in the bed with no acute distress.  HEENT: Head atraumatic, normocephalic. Oropharynx and nasopharynx clear.  LUNGS:decreased breath sounds bilaterally, no wheezing, few bibasilar rales, rhonchi. No use of accessory muscles of respiration.  CARDIOVASCULAR: S1, S2 normal. No murmurs, rubs, or gallops. Irregular heart rhythm ABDOMEN: Soft, nontender, nondistended. Bowel sounds present. No organomegaly or mass.  EXTREMITIES: ++ edema b/l.    NEUROLOGIC: nonfocal PSYCHIATRIC:  patient is alert and oriented x 3.  SKIN: No obvious rash, lesion, or ulcer.   LABORATORY PANEL:  CBC Recent Labs  Lab 10/08/21 0819  WBC 9.6  HGB 13.0  HCT 38.3  PLT 248    Chemistries  Recent Labs  Lab 10/07/21 1553 10/07/21 2030 10/08/21 0819  NA 129*  --  134*  K 4.0  --  3.6  CL 96*  --  99  CO2 23  --  27  GLUCOSE 167*  --  115*  BUN 20  --  21  CREATININE 0.82  --  0.94  CALCIUM 8.8*  --  8.6*  MG  --  2.0  --   AST 84*  --   --   ALT 126*  --   --   ALKPHOS 87  --   --  BILITOT 2.0*  --   --    Cardiac Enzymes No results for input(s): TROPONINI in the last 168 hours. RADIOLOGY:  DG Chest Portable 1 View  Result Date: 10/07/2021 CLINICAL DATA:  Shortness of breath. EXAM: PORTABLE CHEST 1 VIEW COMPARISON:  January 29, 2021 FINDINGS: Injectable port in stable position. Calcific atherosclerotic disease of the aorta. Enlarged cardiac silhouette. Small bilateral pleural effusions. Mild interstitial pulmonary edema. IMPRESSION: Enlarged cardiac silhouette with mild interstitial pulmonary edema and small bilateral pleural effusions. Electronically Signed   By: Fidela Salisbury M.D.   On: 10/07/2021 16:58   ECHOCARDIOGRAM  COMPLETE  Result Date: 10/08/2021    ECHOCARDIOGRAM REPORT   Patient Name:   Carol [name] Date of Exam: 10/08/2021 Medical Rec #:  [id number]                Height:       66.0 in Accession #:    [id number]               Weight:       185.2 lb Date of Birth:  Mar 12, 1941                BSA:          1.935 m Patient Age:    80 years                 BP:           122/91 mmHg Patient Gender: F                        HR:           123 bpm. Exam Location:  [facility] Procedure: 2D Echo, Color Doppler and Cardiac Doppler Indications:     Atrial Fibrillation I48.91  History:         Patient has prior history of Echocardiogram examinations, most                  recent 01/31/2021. Arrythmias:Atrial Fibrillation; Risk                  Factors:Dyslipidemia. Moderate Mitral regugitation,                  Mild tricuspid regurgitation.  Sonographer:     Sherrie Sport Referring Phys:  [id number] AMY N COX Diagnosing Phys: [name] MD  Sonographer Comments: Suboptimal apical window. IMPRESSIONS  1. Left ventricular ejection fraction, by estimation, is 35 to 40%. The left ventricle has moderately decreased function. The left ventricle demonstrates global hypokinesis. The left ventricular internal cavity size was mildly dilated. There is mild left ventricular hypertrophy. Left ventricular diastolic function could not be evaluated.  2. Right ventricular systolic function is normal. The right ventricular size is normal.  3. Left atrial size was mildly dilated.  4. Right atrial size was mildly dilated.  5. The mitral valve is normal in structure. Moderate to severe mitral valve regurgitation.  6. The aortic valve is normal in structure. Aortic valve regurgitation is not visualized. FINDINGS  Left Ventricle: Left ventricular ejection fraction, by estimation, is 35 to 40%. The left ventricle has moderately decreased function. The left ventricle demonstrates global hypokinesis. The left ventricular internal cavity size was mildly  dilated. There is mild left ventricular hypertrophy. Left ventricular diastolic function could not be evaluated. Right Ventricle: The right ventricular size is normal. No increase in right ventricular wall thickness. Right ventricular systolic function is normal.  Left Atrium: Left atrial size was mildly dilated. Right Atrium: Right atrial size was mildly dilated. Pericardium: There is no evidence of pericardial effusion. Mitral Valve: The mitral valve is normal in structure. Moderate to severe mitral valve regurgitation. Tricuspid Valve: The tricuspid valve is normal in structure. Tricuspid valve regurgitation is mild. Aortic Valve: The aortic valve is normal in structure. Aortic valve regurgitation is not visualized. Aortic valve mean gradient measures 1.0 mmHg. Aortic valve peak gradient measures 2.1 mmHg. Aortic valve area, by VTI measures 1.78 cm. Pulmonic Valve: The pulmonic valve was normal in structure. Pulmonic valve regurgitation is not visualized. Aorta: The aortic root and ascending aorta are structurally normal, with no evidence of dilitation. IAS/Shunts: No atrial level shunt detected by color flow Doppler.  LEFT VENTRICLE PLAX 2D LVIDd:         4.80 cm LVIDs:         4.10 cm LV PW:         0.90 cm LV IVS:        1.05 cm LVOT diam:     2.00 cm LV SV:         18 LV SV Index:   9 LVOT Area:     3.14 cm  RIGHT VENTRICLE RV Basal diam:  4.60 cm RV S prime:     10.10 cm/s TAPSE (M-mode): 2.8 cm LEFT ATRIUM              Index        RIGHT ATRIUM           Index LA diam:        4.80 cm  2.48 cm/m   RA Area:     31.00 cm LA Vol (A2C):   172.0 ml 88.87 ml/m  RA Volume:   110.00 ml 56.83 ml/m LA Vol (A4C):   95.0 ml  49.08 ml/m LA Biplane Vol: 131.0 ml 67.68 ml/m  AORTIC VALVE                    PULMONIC VALVE AV Area (Vmax):    1.65 cm     PV Vmax:       0.33 m/s AV Area (Vmean):   1.55 cm     PV Vmean:      24.900 cm/s AV Area (VTI):     1.78 cm     PV VTI:        0.049 m AV Vmax:           73.20  cm/s   PV Peak grad:  0.4 mmHg AV Vmean:          54.600 cm/s  PV Mean grad:  0.0 mmHg AV VTI:            0.103 m AV Peak Grad:      2.1 mmHg AV Mean Grad:      1.0 mmHg LVOT Vmax:         38.50 cm/s LVOT Vmean:        27.000 cm/s LVOT VTI:          0.058 m LVOT/AV VTI ratio: 0.57  AORTA Ao Root diam: 3.48 cm MITRAL VALVE               TRICUSPID VALVE MV Area (PHT): 4.99 cm    TR Peak grad:   24.2 mmHg MV Decel Time: 152 msec    TR Vmax:        246.00 cm/s MV E velocity:  87.80 cm/s                            SHUNTS                            Systemic VTI:  0.06 m                            Systemic Diam: 2.00 cm [name] MD Electronically signed by [name] MD Signature Date/Time: 10/08/2021/1:33:20 PM    Final    US ABDOMEN LIMITED RUQ (LIVER/GB)  Result Date: 10/07/2021 CLINICAL DATA:  Hyperbilirubinemia. EXAM: ULTRASOUND ABDOMEN LIMITED RIGHT UPPER QUADRANT COMPARISON:  July 21, 2017 FINDINGS: Gallbladder: No gallstones or wall thickening visualized. No sonographic Murphy sign noted by sonographer. Common bile duct: Diameter: 3 mm Liver: No focal lesion identified. Within normal limits in parenchymal echogenicity. Portal vein is patent on color Doppler imaging with normal direction of blood flow towards the liver. Other: Right pleural effusion. IMPRESSION: 1. Unremarkable sonographic appearance of the liver and gallbladder. No biliary ductal dilatation. 2. Right pleural effusion. Electronically Signed   By: Dahlia Bailiff M.D.   On: 10/07/2021 19:42   ASSESSMENT AND PLAN:  Carol [name] is a 80 y.o. female with medical history significant for hypertension, atrial fibrillation history of ablation in end of May 2022 at [facility] Dr. Lavone Nian, currently on warfarin, history of fibrocystic breast disease, history of pneumonia, hyperlipidemia, IBS, lichen planus, history of lymphoma low-grade B-cell, mild tricuspid insufficiency, moderate mitral insufficiency, who presents emergency  department for chief concerns of shortness of breath.  A fib you're on chronic with RVR secondary to acute on chronic congestive heart failure systolic -- patient currently on amiodarone 200 mg BID,, increased dose of Cardizem CD2 240 by cardiology, metoprolol hundred milligrams BID -- Dr. Nehemiah Massed cardiology plans for cardioversion -- continue warfarin  acute on chronic congestive heart failure systolic severe MR -- patient is echo this admission showed EF of 35 to 40% -- patient received 80 mg Lasix yesterday. Will continue Lasix 20 mg BID -- monitor input output -- monitor creatinine --- low salt diet  Hyperglycemia without diagnosis of diabetes -- A1c in April 2022 was 6.2 -- will continue sliding scale -- check A1c  nerve pain secondary to shingles -- continue gabapentin  [name] -- continue PPI   Procedures: Family communication : husband at bedside Consults : cardiology CODE STATUS: full DVT Prophylaxis : warfarin Level of care: Progressive Status is: Inpatient  Remains inpatient appropriate because: CHF, a fib with RVR  patient scheduled for cardioversion tomorrow      TOTAL TIME TAKING CARE OF THIS PATIENT: 40 minutes.  >50% time spent on counselling and coordination of care  Note: This dictation was prepared with Dragon dictation along with smaller phrase technology. Any transcriptional errors that result from this process are unintentional.  Fritzi Mandes M.D    [facility]   CC: Primary care physician; Crecencio Mc, MD Patient ID: Carol [name], female   DOB: Oct 02, 1941, 80 y.o.   MRN: [id number]

## 2021-10-08 NOTE — ED Notes (Addendum)
Pt ambulatory to the restroom to have BM., no assistance needed.

## 2021-10-08 NOTE — ED Notes (Signed)
Informed RN bed assigned 

## 2021-10-08 NOTE — Progress Notes (Signed)
Cardizem gtt started.  Patient having no chest pain but is having some SOB

## 2021-10-08 NOTE — Consult Note (Signed)
ANTICOAGULATION CONSULT NOTE - Initial Consult  Pharmacy Consult for warfarin Indication: atrial fibrillation  Allergies  Allergen Reactions   Prednisone Palpitations    A. fib   Pulmicort [Budesonide] Shortness Of Breath, Itching, Swelling and Other (See Comments)    Felt as if things were crawly on her   Clonidine Other (See Comments)    Pt felt like things were crawling on her arms.   Lotemax [Loteprednol Etabonate]     Broke out around the eye   Morphine And Related Itching and Other (See Comments)    "creepy crawly feeling"   Trovan [Alatrofloxacin Mesylate] Other (See Comments)    "effected the whole system"   Zelnorm [Tegaserod Maleate] Itching    Felt as if things were crawly on her    Erythromycin Rash    Patient Measurements: Height: 5\' 6"  (167.6 cm) Weight: 84 kg (185 lb 3 oz) IBW/kg (Calculated) : 59.3   Vital Signs: BP: 104/80 (12/05 0930) Pulse Rate: 112 (12/05 0930)  Labs: Recent Labs    10/07/21 1553 10/07/21 2030 10/08/21 0819  HGB 12.8  --  13.0  HCT 38.6  --  38.3  PLT 260  --  248  LABPROT 31.2*  --  30.2*  INR 3.0*  --  2.9*  CREATININE 0.82  --  0.94  TROPONINIHS 14 14  --      Estimated Creatinine Clearance: 51.3 mL/min (by C-G formula based on SCr of 0.94 mg/dL).   Medical History: Past Medical History:  Diagnosis Date   A-fib [facility])    Chronic sinusitis    Fibrocystic breast disease    History of pneumonia 1999   Hyperlipidemia    Irritable bowel syndrome    Lichen planus    lymphoma August 2013   Low grade B cell   Mild tricuspid insufficiency Jan 2012   ECHO, [place]   Moderate mitral insufficiency JAN 2012   ECHO, [place]    Medications:  (Not in a hospital admission)  Assessment: Patient admitted with SOB and Atrial fibrillation. Noted ablation performed April/2022. PMH also includes HFpEF, HTN, mitral valve prolapse, PVCs, and recent dx of bronchitis.  Baseline DDI noted amiodarone: Recently increased 12/2  from 200 mg daily to BID  Most recent outpatient warfarin instructions: Warfarin 5mg  x 3 days (11/30-12/2), then alternate 5mg  and 6mg  doses.  Date/time INR  Comment 12/4  3.0  Warfarin 5mg   12/5  2.9  Warfarin 5 mg x 1  Goal of Therapy:  INR 2-3 Monitor platelets by anticoagulation protocol: Yes   Plan:  INR therapeutic (2.9)  Will repeat warfarin dose of Warfarin 5mg  x 1 tonight  INR daily x 3 with AM labs  Dorothe Pea, PharmD, BCPS Clinical Pharmacist   10/08/2021 11:38 AM

## 2021-10-08 NOTE — Progress Notes (Signed)
*  PRELIMINARY RESULTS* Echocardiogram 2D Echocardiogram has been performed.  Sherrie Sport 10/08/2021, 12:51 PM

## 2021-10-08 NOTE — Consult Note (Addendum)
[facility] Consultation Note  Patient ID: Carol [name], MRN: [id number], DOB/AGE: 1941-03-26 80 y.o. Admit date: 10/07/2021   Date of Consult: 10/08/2021 Primary Physician: Crecencio Mc, MD Primary Cardiologist: Dr. Nehemiah Massed  Chief Complaint:  Chief Complaint  Patient presents with   Shortness of Breath   Atrial Fibrillation   Reason for Consult:  Atrial fibrillation with RVR  HPI: 80 y.o. female with a past medical history of paroxysmal atrial fibrillation status post ablation in April 2022, chronic heart failure with preserved ejection fraction, hypertension, mitral valve prolapse, frequent PVCs.  Patient was recently evaluated as an outpatient on 10/05/2021 where she was found to be in atrial fibrillation with rapid ventricular rate of 150 bpm by EKG.  At that appointment her amiodarone was increased to 200 mg twice daily, metoprolol succinate increased to 100 mg once daily and her anticoagulation with warfarin was continued.  Patient states that over the weekend, her shortness of breath on exertion became progressively worse and she felt that she needed evaluation at the ED.  On presentation to the ED she was found to continue to be in atrial fibrillation with rapid ventricular rate of 155 bpm, otherwise stable.  Patient was also noted to have small bilateral pleural effusions by CXR and an elevated BNP of 1585, likely acute CHF precipitated by atrial fibrillation with RVR.  She is currently being treated with IV Lasix.  Her heart rate is being managed with IV doses of metoprolol as well as the addition of diltiazem for additional heart rate control.  She remains on amiodarone 200 mg twice daily.  Patient currently denies any problems with chest pain, lower extremity edema.  Echocardiogram on 10/08/2021 shows reduced ejection fraction of 35-40%.  Moderately decreased left ventricular systolic function.  Normal right ventricular systolic function.  Mild biatrial  enlargement.  Moderate to severe MR.  Past Medical History:  Diagnosis Date   A-fib [facility])    Chronic sinusitis    Fibrocystic breast disease    History of pneumonia 1999   Hyperlipidemia    Irritable bowel syndrome    Lichen planus    lymphoma August 2013   Low grade B cell   Mild tricuspid insufficiency Jan 2012   ECHO, [place]   Moderate mitral insufficiency JAN 2012   ECHO, [place]      Surgical History:  Past Surgical History:  Procedure Laterality Date   ABDOMINAL HYSTERECTOMY     precancerous cervix,     ABLATION OF DYSRHYTHMIC FOCUS  August 2013   [facility], Dr. Marcello Moores   BREAST SURGERY     bilateral, benign biopsies   CARDIOVERSION N/A 11/23/2020   Procedure: CARDIOVERSION;  Surgeon: Corey Skains, MD;  Location: [place] [facility];  Service: Cardiovascular;  Laterality: N/A;   CARDIOVERSION N/A 01/17/2021   Procedure: CARDIOVERSION;  Surgeon: Corey Skains, MD;  Location: [facility];  Service: Cardiovascular;  Laterality: N/A;   CARDIOVERSION N/A 01/31/2021   Procedure: CARDIOVERSION;  Surgeon: Corey Skains, MD;  Location: [facility];  Service: Cardiovascular;  Laterality: N/A;   MAXILLARY SINUS LIFT  1990's   [name]   oophorectomy     squamous cell removal Right    right leg calf area.   TEE WITHOUT CARDIOVERSION N/A 01/31/2021   Procedure: TRANSESOPHAGEAL ECHOCARDIOGRAM (TEE);  Surgeon: Corey Skains, MD;  Location: [facility];  Service: Cardiovascular;  Laterality: N/A;     Home Meds: Prior to Admission medications   Medication Sig Start Date End  Date Taking? Authorizing Provider  amiodarone (PACERONE) 200 MG tablet Take 200 mg by mouth 2 (two) times daily.   Yes [provider]  b complex vitamins capsule Take 1 capsule by mouth daily.   Yes [provider]  Cholecalciferol (VITAMIN D3) 125 MCG (5000 UT) TABS Take 5,000 Units by mouth daily.   Yes [provider]  furosemide (LASIX) 20 MG tablet TAKE 1 TABLET DAILY 07/23/21  Yes  Crecencio Mc, MD  gabapentin (NEURONTIN) 100 MG capsule Take 2 capsules (200 mg total) by mouth 3 (three) times daily. Patient taking differently: Take 100 mg by mouth daily. Patient taking 100 mg in the morning and 200 mg at night. 07/23/21  Yes Crecencio Mc, MD  Glucosamine-Chondroit-Vit C-Mn (GLUCOSAMINE 1500 COMPLEX PO) Take 1,500 mg by mouth daily.   Yes [provider]  Glycerin-Polysorbate 80 (REFRESH DRY EYE THERAPY OP) Place 1 drop into both eyes 2 (two) times daily.   Yes [provider]  magnesium oxide (MAG-OX) 400 MG tablet Take 400 mg by mouth in the morning and at bedtime.   Yes [provider]  metoprolol succinate (TOPROL-XL) 100 MG 24 hr tablet Take 1 tablet (100 mg total) by mouth 2 (two) times daily. Take with or immediately following a meal. Patient taking differently: Take 100 mg by mouth daily. Take with or immediately following a meal. 02/01/21  Yes Gherghe, Vella Redhead, MD  Multiple Vitamin (MULTIVITAMIN WITH MINERALS) TABS tablet Take 0.5 tablets by mouth 2 (two) times daily.   Yes [provider]  Omega-3 Fatty Acids (FISH OIL) 1000 MG CAPS Take 2,000 mg by mouth in the morning, at noon, and at bedtime.   Yes [provider]  omeprazole (PRILOSEC) 20 MG capsule Take 1 capsule (20 mg total) by mouth daily. 02/21/21  Yes Crecencio Mc, MD  vitamin E 1000 UNIT capsule Take 1,000 Units by mouth 2 (two) times daily.   Yes [provider]  warfarin (COUMADIN) 3 MG tablet Take 5-6 mg by mouth See admin instructions. Alternate 5 mg and 6 mg daily   Yes [provider]  acetaminophen (TYLENOL) 500 MG tablet Take 500 mg by mouth every 6 (six) hours as needed for moderate pain or headache.    [provider]  bisacodyl (DULCOLAX) 5 MG EC tablet Take 5 mg by mouth daily as needed for moderate constipation.    [provider]  diltiazem (CARDIZEM CD) 120 MG 24 hr capsule Take 1 capsule (120 mg total) by  mouth daily. 02/01/21   Caren Griffins, MD  diphenhydrAMINE (BENADRYL) 25 mg capsule Take 25 mg by mouth every 6 (six) hours as needed for allergies.    [provider]  docusate sodium (COLACE) 100 MG capsule Take 100 mg by mouth daily as needed for mild constipation.    [provider]  hydrOXYzine (VISTARIL) 25 MG capsule Take 1 capsule (25 mg total) by mouth every 8 (eight) hours as needed. 03/29/21   Crecencio Mc, MD  levalbuterol [facility] HFA) 45 MCG/ACT inhaler Inhale 1-2 puffs into the lungs every 4 (four) hours as needed for wheezing. 08/13/12 08/13/12  Crecencio Mc, MD    Inpatient Medications:   amiodarone  200 mg Oral BID   chlorpheniramine-HYDROcodone  5 mL Oral QHS   cholecalciferol  5,000 Units Oral Daily   [START ON 10/09/2021] diltiazem  240 mg Oral Daily   furosemide  20 mg Intravenous BID   gabapentin  100 mg Oral Daily   gabapentin  200 mg Oral QHS   insulin aspart  0-15 Units Subcutaneous TID WC   insulin aspart  0-5 Units Subcutaneous QHS   magnesium oxide  400 mg Oral BID   metoprolol succinate  100 mg Oral BID   multivitamin with minerals  0.5 tablet Oral BID   pantoprazole  40 mg Oral Daily   Vitamin E  800 Units Oral BID   warfarin  5 mg Oral ONCE-1600   Warfarin - Pharmacist Dosing Inpatient   Does not apply q1600     Allergies:  Allergies  Allergen Reactions   Prednisone Palpitations    A. fib   Pulmicort [Budesonide] Shortness Of Breath, Itching, Swelling and Other (See Comments)    Felt as if things were crawly on her   Clonidine Other (See Comments)    Pt felt like things were crawling on her arms.   Lotemax [Loteprednol Etabonate]     Broke out around the eye   Morphine And Related Itching and Other (See Comments)    "creepy crawly feeling"   Trovan [Alatrofloxacin Mesylate] Other (See Comments)    "effected the whole system"   Zelnorm [Tegaserod Maleate] Itching    Felt as if things were crawly on her     Erythromycin Rash    Social History   Socioeconomic History   Marital status: Married    Spouse name: [name]    Number of children: 2   Years of education: Not on file   Highest education level: Not on file  Occupational History   Not on file  Tobacco Use   Smoking status: Never   Smokeless tobacco: Never  Vaping Use   Vaping Use: Not on file  Substance and Sexual Activity   Alcohol use: Yes    Alcohol/week: 4.0 standard drinks    Types: 4 Glasses of wine per week    Comment: Occ.   Drug use: No   Sexual activity: Not Currently    Birth control/protection: Post-menopausal  Other Topics Concern   Not on file  Social History Narrative   Lives at home with spouse    Social Determinants of Health   Financial Resource Strain: Low Risk    Difficulty of Paying Living Expenses: Not hard at all  Food Insecurity: No Food Insecurity   Worried About Charity fundraiser in the Last Year: Never true   Arboriculturist in the Last Year: Never true  Transportation Needs: No Transportation Needs   Lack of Transportation (Medical): No   Lack of Transportation (Non-Medical): No  Physical Activity: Insufficiently Active   Days of Exercise per Week: 5 days   Minutes of Exercise per Session: 20 min  Stress: No Stress Concern Present   Feeling of Stress : Not at all  Social Connections: Unknown   Frequency of Communication with Friends and Family: More than three times a week   Frequency of Social Gatherings with Friends and Family: More than three times a week   Attends Religious Services: Not on Electrical engineer or Organizations: Not on file   Attends Archivist Meetings: Not on file   Marital Status: Married  Human resources officer Violence: Not At Risk   Fear of Current or Ex-Partner: No   Emotionally Abused: No   Physically Abused: No   Sexually Abused: No     Family History  Problem Relation Age of Onset   Cancer Mother  Breast   Hyperlipidemia  Father    Heart disease Father    Hypertension Father    Kidney disease Father    Cancer Maternal Grandfather        colon CA   Diverticulitis Sister      Review of Systems Positive for shortness of breath, palpitations Negative for: General:  chills, fever, night sweats or weight changes.  Cardiovascular: PND orthopnea syncope dizziness  Dermatological skin lesions rashes Respiratory: Cough congestion Urologic: Frequent urination urination at night and hematuria Abdominal: negative for nausea, vomiting, diarrhea, bright red blood per rectum, melena, or hematemesis Neurologic: negative for visual changes, and/or hearing changes  All other systems reviewed and are otherwise negative except as noted above.  Labs: No results for input(s): CKTOTAL, CKMB, TROPONINI in the last 72 hours. Lab Results  Component Value Date   WBC 9.6 10/08/2021   HGB 13.0 10/08/2021   HCT 38.3 10/08/2021   MCV 97.2 10/08/2021   PLT 248 10/08/2021    Recent Labs  Lab 10/07/21 1553 10/08/21 0819  NA 129* 134*  K 4.0 3.6  CL 96* 99  CO2 23 27  BUN 20 21  CREATININE 0.82 0.94  CALCIUM 8.8* 8.6*  PROT 6.7  --   BILITOT 2.0*  --   ALKPHOS 87  --   ALT 126*  --   AST 84*  --   GLUCOSE 167* 115*   Lab Results  Component Value Date   CHOL 223 (H) 04/11/2021   HDL 62.20 04/11/2021   LDLCALC 143 (H) 04/11/2021   TRIG 91.0 04/11/2021   No results found for: DDIMER  Radiology/Studies:  DG Chest Portable 1 View  Result Date: 10/07/2021 CLINICAL DATA:  Shortness of breath. EXAM: PORTABLE CHEST 1 VIEW COMPARISON:  January 29, 2021 FINDINGS: Injectable port in stable position. Calcific atherosclerotic disease of the aorta. Enlarged cardiac silhouette. Small bilateral pleural effusions. Mild interstitial pulmonary edema. IMPRESSION: Enlarged cardiac silhouette with mild interstitial pulmonary edema and small bilateral pleural effusions. Electronically Signed   By: Fidela Salisbury M.D.   On:  10/07/2021 16:58   ECHOCARDIOGRAM COMPLETE  Result Date: 10/08/2021    ECHOCARDIOGRAM REPORT   Patient Name:   KESS MCILWAIN Date of Exam: 10/08/2021 Medical Rec #:  [id number]                Height:       66.0 in Accession #:    [id number]               Weight:       185.2 lb Date of Birth:  [birth year]                BSA:          1.935 m Patient Age:    9 years                 BP:           122/91 mmHg Patient Gender: F                        HR:           123 bpm. Exam Location:  [facility] Procedure: 2D Echo, Color Doppler and Cardiac Doppler Indications:     Atrial Fibrillation I48.91  History:         Patient has prior history of Echocardiogram examinations, most  recent 01/31/2021. Arrythmias:Atrial Fibrillation; Risk                  Factors:Dyslipidemia. Moderate Mitral regugitation,                  Mild tricuspid regurgitation.  Sonographer:     Sherrie Sport Referring Phys:  [id number] AMY N COX Diagnosing Phys: [name] MD  Sonographer Comments: Suboptimal apical window. IMPRESSIONS  1. Left ventricular ejection fraction, by estimation, is 35 to 40%. The left ventricle has moderately decreased function. The left ventricle demonstrates global hypokinesis. The left ventricular internal cavity size was mildly dilated. There is mild left ventricular hypertrophy. Left ventricular diastolic function could not be evaluated.  2. Right ventricular systolic function is normal. The right ventricular size is normal.  3. Left atrial size was mildly dilated.  4. Right atrial size was mildly dilated.  5. The mitral valve is normal in structure. Moderate to severe mitral valve regurgitation.  6. The aortic valve is normal in structure. Aortic valve regurgitation is not visualized. FINDINGS  Left Ventricle: Left ventricular ejection fraction, by estimation, is 35 to 40%. The left ventricle has moderately decreased function. The left ventricle demonstrates global hypokinesis. The left ventricular  internal cavity size was mildly dilated. There is mild left ventricular hypertrophy. Left ventricular diastolic function could not be evaluated. Right Ventricle: The right ventricular size is normal. No increase in right ventricular wall thickness. Right ventricular systolic function is normal. Left Atrium: Left atrial size was mildly dilated. Right Atrium: Right atrial size was mildly dilated. Pericardium: There is no evidence of pericardial effusion. Mitral Valve: The mitral valve is normal in structure. Moderate to severe mitral valve regurgitation. Tricuspid Valve: The tricuspid valve is normal in structure. Tricuspid valve regurgitation is mild. Aortic Valve: The aortic valve is normal in structure. Aortic valve regurgitation is not visualized. Aortic valve mean gradient measures 1.0 mmHg. Aortic valve peak gradient measures 2.1 mmHg. Aortic valve area, by VTI measures 1.78 cm. Pulmonic Valve: The pulmonic valve was normal in structure. Pulmonic valve regurgitation is not visualized. Aorta: The aortic root and ascending aorta are structurally normal, with no evidence of dilitation. IAS/Shunts: No atrial level shunt detected by color flow Doppler.  LEFT VENTRICLE PLAX 2D LVIDd:         4.80 cm LVIDs:         4.10 cm LV PW:         0.90 cm LV IVS:        1.05 cm LVOT diam:     2.00 cm LV SV:         18 LV SV Index:   9 LVOT Area:     3.14 cm  RIGHT VENTRICLE RV Basal diam:  4.60 cm RV S prime:     10.10 cm/s TAPSE (M-mode): 2.8 cm LEFT ATRIUM              Index        RIGHT ATRIUM           Index LA diam:        4.80 cm  2.48 cm/m   RA Area:     31.00 cm LA Vol (A2C):   172.0 ml 88.87 ml/m  RA Volume:   110.00 ml 56.83 ml/m LA Vol (A4C):   95.0 ml  49.08 ml/m LA Biplane Vol: 131.0 ml 67.68 ml/m  AORTIC VALVE  PULMONIC VALVE AV Area (Vmax):    1.65 cm     PV Vmax:       0.33 m/s AV Area (Vmean):   1.55 cm     PV Vmean:      24.900 cm/s AV Area (VTI):     1.78 cm     PV VTI:         0.049 m AV Vmax:           73.20 cm/s   PV Peak grad:  0.4 mmHg AV Vmean:          54.600 cm/s  PV Mean grad:  0.0 mmHg AV VTI:            0.103 m AV Peak Grad:      2.1 mmHg AV Mean Grad:      1.0 mmHg LVOT Vmax:         38.50 cm/s LVOT Vmean:        27.000 cm/s LVOT VTI:          0.058 m LVOT/AV VTI ratio: 0.57  AORTA Ao Root diam: 3.48 cm MITRAL VALVE               TRICUSPID VALVE MV Area (PHT): 4.99 cm    TR Peak grad:   24.2 mmHg MV Decel Time: 152 msec    TR Vmax:        246.00 cm/s MV E velocity: 87.80 cm/s                            SHUNTS                            Systemic VTI:  0.06 m                            Systemic Diam: 2.00 cm [name] MD Electronically signed by [name] MD Signature Date/Time: 10/08/2021/1:33:20 PM    Final    US ABDOMEN LIMITED RUQ (LIVER/GB)  Result Date: 10/07/2021 CLINICAL DATA:  Hyperbilirubinemia. EXAM: ULTRASOUND ABDOMEN LIMITED RIGHT UPPER QUADRANT COMPARISON:  July 21, 2017 FINDINGS: Gallbladder: No gallstones or wall thickening visualized. No sonographic Murphy sign noted by sonographer. Common bile duct: Diameter: 3 mm Liver: No focal lesion identified. Within normal limits in parenchymal echogenicity. Portal vein is patent on color Doppler imaging with normal direction of blood flow towards the liver. Other: Right pleural effusion. IMPRESSION: 1. Unremarkable sonographic appearance of the liver and gallbladder. No biliary ductal dilatation. 2. Right pleural effusion. Electronically Signed   By: Dahlia Bailiff M.D.   On: 10/07/2021 19:42    EKG: No EKG is currently available for review  Weights: Filed Weights   10/07/21 1553  Weight: 84 kg     Physical Exam: Blood pressure (!) 122/91, pulse (!) 123, temperature 98.9 F (37.2 C), temperature source Oral, resp. rate (!) 25, height 5\' 6"  (1.676 m), weight 84 kg, SpO2 99 %. Body mass index is 29.89 kg/m. General: Well developed, well nourished, in no acute distress. Head eyes ears nose  throat: Normocephalic, atraumatic, sclera non-icteric, no xanthomas, nares are without discharge. No apparent thyromegaly and/or mass  Lungs: Normal respiratory effort.  no wheezes, no rales, no rhonchi.  Heart: Irregular rhythm and tachycardic rate with normal S1 S2. no murmur gallop, no rub, PMI is normal size and placement,  carotid upstroke normal without bruit, jugular venous pressure is normal Abdomen: Soft, non-tender, non-distended with normoactive bowel sounds. No hepatomegaly. No rebound/guarding. No obvious abdominal masses. Abdominal aorta is normal size without bruit Extremities: Trace edema. no cyanosis, no clubbing, no ulcers  Peripheral : 2+ bilateral upper extremity pulses, 2+ bilateral femoral pulses, 2+ bilateral dorsal pedal pulse Neuro: Alert and oriented. No facial asymmetry. No focal deficit. Moves all extremities spontaneously. Musculoskeletal: Normal muscle tone without kyphosis Psych:  Responds to questions appropriately with a normal affect.    Assessment: 80 year old female with a past medical history of paroxysmal atrial fibrillation s/p recent ablation on 02/2021 with recurrence of atrial fibrillation with RVR currently remaining at 120-130 bpm on combination of amiodarone, diltiazem, metoprolol.  Patient also with acute on chronic congestive heart failure with reduced ejection fraction of 35-40%, small bilateral pleural effusions on CXR, likely secondary to A. fib with RVR.  Plan: -Plan for TEE/Cardioversion tomorrow due to inconsistent INR with Warfarin over the last 3 weeks for possible conversion to normal sinus rhythm -continue amiodarone 200 mg BID for rhythm control -increase diltiazem to 240 mg once daily for better heart rate control -continue metoprolol 100 mg BID without changes -continue lasix for fluid overload and bilateral pleural effusions -continue warfarin for anticoagulations and stroke risk reduction with atrial fibrillation  Signed, Jettie Booze  PA-C [facility] 10/08/2021, 2:27 PM The patient has been interviewed and examined. I agree with assessment and plan above. [name] MD [facility]

## 2021-10-09 ENCOUNTER — Inpatient Hospital Stay: Payer: Medicare Other | Admitting: Anesthesiology

## 2021-10-09 ENCOUNTER — Inpatient Hospital Stay
Admit: 2021-10-09 | Discharge: 2021-10-09 | Disposition: A | Discharge status: Home / Self Care | Payer: Medicare Other | Attending: Internal Medicine | Admitting: Internal Medicine

## 2021-10-09 ENCOUNTER — Encounter
Admission: EM | Disposition: A | Discharge status: Home / Self Care | Payer: Self-pay | Source: Home / Self Care | Attending: Internal Medicine

## 2021-10-09 HISTORY — PX: TEE WITHOUT CARDIOVERSION: SHX5443

## 2021-10-09 HISTORY — PX: CARDIOVERSION: SHX1299

## 2021-10-09 LAB — GLUCOSE, CAPILLARY
Glucose-Capillary: 103 mg/dL — ABNORMAL HIGH (ref 70–99)
Glucose-Capillary: 103 mg/dL — ABNORMAL HIGH (ref 70–99)

## 2021-10-09 LAB — PROTIME-INR
INR: 3.3 — ABNORMAL HIGH (ref 0.8–1.2)
Prothrombin Time: 33.3 seconds — ABNORMAL HIGH (ref 11.4–15.2)

## 2021-10-09 SURGERY — CARDIOVERSION
Anesthesia: General

## 2021-10-09 SURGERY — ECHOCARDIOGRAM, TRANSESOPHAGEAL
Anesthesia: General

## 2021-10-09 MED ORDER — SODIUM CHLORIDE 0.9 % IV SOLN: INTRAVENOUS | Status: DC

## 2021-10-09 MED ORDER — PROPOFOL 10 MG/ML IV BOLUS
INTRAVENOUS | Status: DC | PRN
  Administered 2021-10-09: 30 mg via INTRAVENOUS
  Administered 2021-10-09: 70 mg via INTRAVENOUS
  Administered 2021-10-09: 40 mg via INTRAVENOUS

## 2021-10-09 MED ORDER — BUTAMBEN-TETRACAINE-BENZOCAINE 2-2-14 % EX AERO
INHALATION_SPRAY | CUTANEOUS | Status: AC
  Filled 2021-10-09: qty 5

## 2021-10-09 MED ORDER — EPHEDRINE SULFATE 50 MG/ML IJ SOLN
INTRAMUSCULAR | Status: DC | PRN
  Administered 2021-10-09 (×3): 5 mg via INTRAVENOUS
  Administered 2021-10-09: 10 mg via INTRAVENOUS

## 2021-10-09 MED ORDER — DILTIAZEM HCL ER COATED BEADS 120 MG PO CP24: 240.0000 mg | ORAL_CAPSULE | Freq: Every day | ORAL | 0 refills | Status: DC

## 2021-10-09 MED ORDER — PHENYLEPHRINE HCL (PRESSORS) 10 MG/ML IV SOLN
INTRAVENOUS | Status: DC | PRN
  Administered 2021-10-09: 80 ug via INTRAVENOUS

## 2021-10-09 MED ORDER — WARFARIN SODIUM 2.5 MG PO TABS
2.5000 mg | ORAL_TABLET | Freq: Once | ORAL | Status: DC
  Filled 2021-10-09: qty 1

## 2021-10-09 MED ORDER — DEXMEDETOMIDINE (PRECEDEX) IN NS 20 MCG/5ML (4 MCG/ML) IV SYRINGE
PREFILLED_SYRINGE | INTRAVENOUS | Status: DC | PRN
  Administered 2021-10-09: 8 ug via INTRAVENOUS

## 2021-10-09 MED ORDER — PHENYLEPHRINE HCL-NACL 20-0.9 MG/250ML-% IV SOLN
INTRAVENOUS | Status: AC
  Filled 2021-10-09: qty 250

## 2021-10-09 MED ORDER — PROPOFOL 10 MG/ML IV BOLUS
INTRAVENOUS | Status: AC
  Filled 2021-10-09: qty 20

## 2021-10-09 MED ORDER — LIDOCAINE VISCOUS HCL 2 % MT SOLN
OROMUCOSAL | Status: AC
  Filled 2021-10-09: qty 15

## 2021-10-09 NOTE — Consult Note (Signed)
ANTICOAGULATION CONSULT NOTE - Follow Up  Pharmacy Consult for warfarin Indication: atrial fibrillation  Allergies  Allergen Reactions   Prednisone Palpitations    A. fib   Pulmicort [Budesonide] Shortness Of Breath, Itching, Swelling and Other (See Comments)    Felt as if things were crawly on her   Clonidine Other (See Comments)    Pt felt like things were crawling on her arms.   Lotemax [Loteprednol Etabonate]     Broke out around the eye   Morphine And Related Itching and Other (See Comments)    "creepy crawly feeling"   Trovan [Alatrofloxacin Mesylate] Other (See Comments)    "effected the whole system"   Zelnorm [Tegaserod Maleate] Itching    Felt as if things were crawly on her    Erythromycin Rash    Patient Measurements: Height: 5\' 6"  (167.6 cm) Weight: 83.5 kg (184 lb 1.4 oz) IBW/kg (Calculated) : 59.3   Vital Signs: Temp: 98 F (36.7 C) (12/06 0740) Temp Source: Oral (12/06 0500) BP: 124/94 (12/06 0740) Pulse Rate: 83 (12/06 0740)  Labs: Recent Labs    10/07/21 1553 10/07/21 2030 10/08/21 0819 10/09/21 0350  HGB 12.8  --  13.0  --   HCT 38.6  --  38.3  --   PLT 260  --  248  --   LABPROT 31.2*  --  30.2* 33.3*  INR 3.0*  --  2.9* 3.3*  CREATININE 0.82  --  0.94  --   TROPONINIHS 14 14  --   --      Estimated Creatinine Clearance: 51.1 mL/min (by C-G formula based on SCr of 0.94 mg/dL).   Medical History: Past Medical History:  Diagnosis Date   A-fib [facility])    Chronic sinusitis    Fibrocystic breast disease    History of pneumonia 1999   Hyperlipidemia    Irritable bowel syndrome    Lichen planus    lymphoma August 2013   Low grade B cell   Mild tricuspid insufficiency Jan 2012   ECHO, [place]   Moderate mitral insufficiency JAN 2012   ECHO, [place]    Medications:  Medications Prior to Admission  Medication Sig Dispense Refill Last Dose   amiodarone (PACERONE) 200 MG tablet Take 200 mg by mouth 2 (two) times daily.   10/07/2021    b complex vitamins capsule Take 1 capsule by mouth daily.   10/07/2021   Cholecalciferol (VITAMIN D3) 125 MCG (5000 UT) TABS Take 5,000 Units by mouth daily.   10/06/2021   furosemide (LASIX) 20 MG tablet TAKE 1 TABLET DAILY 90 tablet 0 10/07/2021 at 0800   gabapentin (NEURONTIN) 100 MG capsule Take 2 capsules (200 mg total) by mouth 3 (three) times daily. (Patient taking differently: Take 100 mg by mouth daily. Patient taking 100 mg in the morning and 200 mg at night.) 540 capsule 1 10/07/2021 at 0800   Glucosamine-Chondroit-Vit C-Mn (GLUCOSAMINE 1500 COMPLEX PO) Take 1,500 mg by mouth daily.   10/06/2021   Glycerin-Polysorbate 80 (REFRESH DRY EYE THERAPY OP) Place 1 drop into both eyes 2 (two) times daily.   10/06/2021   magnesium oxide (MAG-OX) 400 MG tablet Take 400 mg by mouth in the morning and at bedtime.   10/07/2021   metoprolol succinate (TOPROL-XL) 100 MG 24 hr tablet Take 1 tablet (100 mg total) by mouth 2 (two) times daily. Take with or immediately following a meal. (Patient taking differently: Take 100 mg by mouth daily. Take with or immediately following a  meal.)   10/07/2021   Multiple Vitamin (MULTIVITAMIN WITH MINERALS) TABS tablet Take 0.5 tablets by mouth 2 (two) times daily.   10/06/2021   Omega-3 Fatty Acids (FISH OIL) 1000 MG CAPS Take 2,000 mg by mouth in the morning, at noon, and at bedtime.   10/07/2021   omeprazole (PRILOSEC) 20 MG capsule Take 1 capsule (20 mg total) by mouth daily. 90 capsule 1 10/07/2021   vitamin E 1000 UNIT capsule Take 1,000 Units by mouth 2 (two) times daily.   10/06/2021   warfarin (COUMADIN) 3 MG tablet Take 5-6 mg by mouth See admin instructions. Alternate 5 mg and 6 mg daily   10/06/2021 at 1800   acetaminophen (TYLENOL) 500 MG tablet Take 500 mg by mouth every 6 (six) hours as needed for moderate pain or headache.   prn at unknown   bisacodyl (DULCOLAX) 5 MG EC tablet Take 5 mg by mouth daily as needed for moderate constipation.   prn at unknown    diltiazem (CARDIZEM CD) 120 MG 24 hr capsule Take 1 capsule (120 mg total) by mouth daily.   prn at unknown   diphenhydrAMINE (BENADRYL) 25 mg capsule Take 25 mg by mouth every 6 (six) hours as needed for allergies.   prn at unknown   docusate sodium (COLACE) 100 MG capsule Take 100 mg by mouth daily as needed for mild constipation.   prn at unknown   hydrOXYzine (VISTARIL) 25 MG capsule Take 1 capsule (25 mg total) by mouth every 8 (eight) hours as needed. 30 capsule 0 prn at unknown    Assessment: Patient admitted with SOB and Atrial fibrillation. Noted ablation performed April/2022. PMH also includes HFpEF, HTN, mitral valve prolapse, PVCs, and recent dx of bronchitis.  Baseline DDI noted amiodarone: Recently increased 12/2 from 200 mg daily to 200mg  BID  Most recent outpatient warfarin instructions: Warfarin 5mg  x 3 days (11/30-12/2), then alternate 5mg  and 6mg  doses.  Date/time INR  Comment 12/4  3.0  Warfarin 5mg  x1 12/5  2.9  Warfarin 5 mg x 1 12/6  3.3  Warfarin 2.5mg  x1 today  Goal of Therapy:  INR 2-3 Monitor platelets by anticoagulation protocol: Yes   Plan:  INR slightly supra-therapeutic , but patient undergoing TEE & cardioversion today. Important to keep therapeutic for the next 4 weeks. Will order lower dose of warfarin 2.5mg  x1 (50% reduction on daily dose) Continue to monitor INR with AM labs and dose accordantly  INR daily x 3 with AM labs  Josey Dettmann Rodriguez-Guzman PharmD, BCPS 10/09/2021 8:11 AM

## 2021-10-09 NOTE — Progress Notes (Signed)
*  PRELIMINARY RESULTS* Echocardiogram Echocardiogram Transesophageal has been performed.  Carol [name] 10/09/2021, 8:52 AM

## 2021-10-09 NOTE — Transfer of Care (Signed)
Immediate Anesthesia Transfer of Care Note  Patient: Carol [name]  Procedure(s) Performed: Procedure(s): CARDIOVERSION (N/A) TRANSESOPHAGEAL ECHOCARDIOGRAM (TEE) (N/A)  Patient Location: PACU and Short Stay  Anesthesia Type:General  Level of Consciousness: awake, alert  and oriented  Airway & Oxygen Therapy: Patient Spontanous Breathing and Patient connected to nasal cannula oxygen  Post-op Assessment: Report given to RN and Post -op Vital signs reviewed and stable  Post vital signs: Reviewed and stable  Last Vitals:  Vitals:   10/09/21 0850 10/09/21 0851  BP:  (!) 79/47  Pulse: (!) 46 (!) 59  Resp: 18 20  Temp:    SpO2: 15% 94%    Complications: No apparent anesthesia complications

## 2021-10-09 NOTE — Anesthesia Preprocedure Evaluation (Addendum)
Anesthesia Evaluation  Patient identified by MRN, date of birth, ID band Patient awake    Reviewed: Allergy & Precautions, NPO status , Patient's Chart, lab work & pertinent test results  History of Anesthesia Complications Negative for: history of anesthetic complications  Airway Mallampati: III   Neck ROM: Full    Dental  (+)    Pulmonary COPD,    Pulmonary exam normal breath sounds clear to auscultation       Cardiovascular +CHF (with preserved EF)  + dysrhythmias (paroxysmal a fib with RVR on warfarin)  Rhythm:Irregular Rate:Tachycardia     Neuro/Psych negative neurological ROS     GI/Hepatic negative GI ROS,   Endo/Other  negative endocrine ROS  Renal/GU negative Renal ROS     Musculoskeletal   Abdominal   Peds  Hematology Lymphoma    Anesthesia Other Findings   Reproductive/Obstetrics                            Anesthesia Physical Anesthesia Plan  ASA: 3  Anesthesia Plan: General   Post-op Pain Management:    Induction: Intravenous  PONV Risk Score and Plan: 3 and Propofol infusion, TIVA and Treatment may vary due to age or medical condition  Airway Management Planned: Natural Airway  Additional Equipment:   Intra-op Plan:   Post-operative Plan:   Informed Consent: I have reviewed the patients History and Physical, chart, labs and discussed the procedure including the risks, benefits and alternatives for the proposed anesthesia with the patient or authorized representative who has indicated his/her understanding and acceptance.       Plan Discussed with: CRNA  Anesthesia Plan Comments: (LMA/GETA backup discussed.  Patient consented for risks of anesthesia including but not limited to:  - adverse reactions to medications - damage to eyes, teeth, lips or other oral mucosa - nerve damage due to positioning  - sore throat or hoarseness - damage to heart, brain,  nerves, lungs, other parts of body or loss of life  Informed patient about role of CRNA in peri- and intra-operative care.  Patient voiced understanding.)        Anesthesia Quick Evaluation

## 2021-10-09 NOTE — CV Procedure (Signed)
Electrical Cardioversion Procedure Note Carol [name] [id number] 1941-05-19  Procedure: Electrical Cardioversion Indications:  Paroxysmal non valvular atrial fibrillation  Procedure Details Consent: Risks of procedure as well as the alternatives and risks of each were explained to the (patient/caregiver).  Consent for procedure obtained. Time Out: Verified patient identification, verified procedure, site/side was marked, verified correct patient position, special equipment/implants available, medications/allergies/relevent history reviewed, required imaging and test results available.  Performed  Patient placed on cardiac monitor, pulse oximetry, supplemental oxygen as necessary.  Sedation given: Propofol and versed as per anesthesia  Pacer pads placed anterior and posterior chest.  Cardioverted 1 time(s).  Cardioverted at 120J.  Evaluation Findings: Post procedure EKG shows: NSR Complications: None Patient did tolerate procedure well.   Carol [name] M.D. [facility] [place] 10/09/2021, 8:44 AM

## 2021-10-09 NOTE — Progress Notes (Signed)
Patient alert and oriented, vss, no complaints of pain.  D/c telemetry and piv. No questions. Will be escorted out of hospital via wheelchair by volunteers.   

## 2021-10-09 NOTE — Anesthesia Preprocedure Evaluation (Signed)
Anesthesia Evaluation  Patient identified by MRN, date of birth, ID band Patient awake    Reviewed: Allergy & Precautions, NPO status , Patient's Chart, lab work & pertinent test results  History of Anesthesia Complications Negative for: history of anesthetic complications  Airway Mallampati: III   Neck ROM: Full    Dental  (+)    Pulmonary COPD,    Pulmonary exam normal breath sounds clear to auscultation       Cardiovascular +CHF (with preserved EF)  + dysrhythmias (paroxysmal a fib with RVR on warfarin)  Rhythm:Irregular Rate:Tachycardia     Neuro/Psych negative neurological ROS     GI/Hepatic negative GI ROS,   Endo/Other  negative endocrine ROS  Renal/GU negative Renal ROS     Musculoskeletal   Abdominal   Peds  Hematology Lymphoma    Anesthesia Other Findings   Reproductive/Obstetrics                             Anesthesia Physical  Anesthesia Plan  ASA: 3  Anesthesia Plan: General   Post-op Pain Management:    Induction: Intravenous  PONV Risk Score and Plan: 3 and Propofol infusion, TIVA and Treatment may vary due to age or medical condition  Airway Management Planned: Natural Airway  Additional Equipment:   Intra-op Plan:   Post-operative Plan:   Informed Consent: I have reviewed the patients History and Physical, chart, labs and discussed the procedure including the risks, benefits and alternatives for the proposed anesthesia with the patient or authorized representative who has indicated his/her understanding and acceptance.       Plan Discussed with: CRNA  Anesthesia Plan Comments: (LMA/GETA backup discussed.  Patient consented for risks of anesthesia including but not limited to:  - adverse reactions to medications - damage to eyes, teeth, lips or other oral mucosa - nerve damage due to positioning  - sore throat or hoarseness - damage to heart,  brain, nerves, lungs, other parts of body or loss of life  Informed patient about role of CRNA in peri- and intra-operative care.  Patient voiced understanding.)        Anesthesia Quick Evaluation

## 2021-10-09 NOTE — Anesthesia Procedure Notes (Signed)
Date/Time: 10/09/2021 8:28 AM Performed by: Doreen Salvage, CRNA Pre-anesthesia Checklist: Patient identified, Emergency Drugs available, Suction available and Patient being monitored Patient Re-evaluated:Patient Re-evaluated prior to induction Oxygen Delivery Method: Nasal cannula Induction Type: IV induction Dental Injury: Teeth and Oropharynx as per pre-operative assessment  Comments: Nasal cannula with etCO2 monitoring

## 2021-10-09 NOTE — CV Procedure (Signed)
Transesophageal echocardiogram preliminary report  Carol [name] [id number] 08-08-1941  Preliminary diagnosis  Atrial fibrillation with or without cardioversion  Postprocedural diagnosis  Atrial fibrillation without left atrial appendage thrombus  Time out A timeout was performed by the nursing staff and physicians specifically identifying the procedure performed, identification of the patient, the type of sedation, all allergies and medications, all pertinent medical history, and presedation assessment of nasopharynx. The patient and or family understand the risks of the procedure including the rare risks of death, stroke, heart attack, esophogeal perforation, sore throat, and reaction to medications given.  Moderate sedation During this procedure the patient has received general anesthesia by the anesthesia documentation.  The patient had continued monitoring of heart rate, oxygenation, blood pressure, respiratory rate, and extent of signs of sedation throughout the entire procedure.  The patient received this moderate sedation over a period of 22 minutes.  Both the nursing staff and I were present during the procedure when the patient had moderate sedation for 100% of the time.  Treatment considerations  Electrical cardioversion of atrial fibrillation due to no evidence of atrial appendage thrombus  For further details of transesophageal echocardiogram please refer to final report.  Signed,  Corey Skains M.D. [facility] 10/09/2021 8:43 AM

## 2021-10-09 NOTE — Discharge Summary (Signed)
[facility] NAME: Carol [name]    MR#:  [id number]  DATE OF BIRTH:  14-Aug-1941  DATE OF ADMISSION:  10/07/2021 ADMITTING PHYSICIAN: Fritzi Mandes, MD  DATE OF DISCHARGE: 10/09/2021  PRIMARY CARE PHYSICIAN: Crecencio Mc, MD    ADMISSION DIAGNOSIS:  Bilirubinemia [E80.6] Acute respiratory failure with hypoxia (HCC) [J96.01] Atrial fibrillation with RVR (HCC) [I48.91] Acute on chronic congestive heart failure, unspecified heart failure type ([place]) [I50.9] A-fib ([place]) [I48.91]  DISCHARGE DIAGNOSIS:  acute on chronic congestive heart failure systolic a fib with RVR status post cardioversion cardiomyopathy EF 35%, severe MR  SECONDARY DIAGNOSIS:   Past Medical History:  Diagnosis Date   A-fib ([place])    Chronic sinusitis    Fibrocystic breast disease    History of pneumonia 1999   Hyperlipidemia    Irritable bowel syndrome    Lichen planus    lymphoma August 2013   Low grade B cell   Mild tricuspid insufficiency Jan 2012   ECHO, [place]   Moderate mitral insufficiency JAN 2012   ECHO, [place] COURSE:  Carol [name] is a 80 y.o. female with medical history significant for hypertension, atrial fibrillation history of ablation in end of May 2022 at [facility] Dr. Lavone Nian, currently on warfarin, history of fibrocystic breast disease, history of pneumonia, hyperlipidemia, IBS, lichen planus, history of lymphoma low-grade B-cell, mild tricuspid insufficiency, moderate mitral insufficiency, who presents emergency department for chief concerns of shortness of breath.   A fib you're on chronic with RVR secondary to acute on chronic congestive heart failure systolic -- patient currently on amiodarone 200 mg BID,, increased dose of Cardizem CD2 240 by cardiology, metoprolol hundred milligrams BID -- Dr. Nehemiah Massed cardiology plans for cardioversion -- continue warfarin --12/6-- patient is status post cardioversion. She  is in sinus rhythm. Blood pressure stable. Initially it was a bit low after the procedure however recovered. Patient feels fine. No chest pain. -- Dr. Nehemiah Massed is okay with patient discharging home on amiodarone, metoprolol, diltiazem current dosage. Adjustment with meds will be made as outpatient follow-up  -- INR 3.3 continue warfarin  acute on chronic congestive heart failure systolic severe MR -- patient is echo this admission showed EF of 35 to 40% -- patient received 80 mg Lasix yesterday. Will continue Lasix 20 mg BID -- monitor input output -- monitor creatinine --- low salt diet -- switch to daily Lasix 20 mg at home.   Hyperglycemia without diagnosis of diabetes -- A1c in April 2022 was 6.2   nerve pain secondary to shingles -- continue gabapentin   [name] -- continue PPI     Procedures: cardioversion Family communication : husband and son at bedside Consults : cardiology CODE STATUS: full DVT Prophylaxis : warfarin Level of care: Progressive Status is: Inpatient  hemodynamically stable. Okay from cardiology standpoint for discharge. CONSULTS OBTAINED:  Treatment Team:  Corey Skains, MD  DRUG ALLERGIES:   Allergies  Allergen Reactions   Prednisone Palpitations    A. fib   Pulmicort [Budesonide] Shortness Of Breath, Itching, Swelling and Other (See Comments)    Felt as if things were crawly on her   Clonidine Other (See Comments)    Pt felt like things were crawling on her arms.   Lotemax [Loteprednol Etabonate]     Broke out around the eye   Morphine And Related Itching and Other (See Comments)    "creepy crawly feeling"   IT consultant  Mesylate] Other (See Comments)    "effected the whole system"   Zelnorm [Tegaserod Maleate] Itching    Felt as if things were crawly on her    Erythromycin Rash    DISCHARGE MEDICATIONS:   Allergies as of 10/09/2021       Reactions   Prednisone Palpitations   A. fib   Pulmicort [budesonide]  Shortness Of Breath, Itching, Swelling, Other (See Comments)   Felt as if things were crawly on her   Clonidine Other (See Comments)   Pt felt like things were crawling on her arms.   Lotemax [loteprednol Etabonate]    Broke out around the eye   Morphine And Related Itching, Other (See Comments)   "creepy crawly feeling"   Trovan [alatrofloxacin Mesylate] Other (See Comments)   "effected the whole system"   Zelnorm [tegaserod Maleate] Itching   Felt as if things were crawly on her   Erythromycin Rash        Medication List     TAKE these medications    acetaminophen 500 MG tablet Commonly known as: TYLENOL Take 500 mg by mouth every 6 (six) hours as needed for moderate pain or headache.   amiodarone 200 MG tablet Commonly known as: PACERONE Take 200 mg by mouth 2 (two) times daily.   b complex vitamins capsule Take 1 capsule by mouth daily.   bisacodyl 5 MG EC tablet Commonly known as: DULCOLAX Take 5 mg by mouth daily as needed for moderate constipation.   diltiazem 120 MG 24 hr capsule Commonly known as: CARDIZEM CD Take 2 capsules (240 mg total) by mouth daily. What changed: how much to take   diphenhydrAMINE 25 mg capsule Commonly known as: BENADRYL Take 25 mg by mouth every 6 (six) hours as needed for allergies.   docusate sodium 100 MG capsule Commonly known as: COLACE Take 100 mg by mouth daily as needed for mild constipation.   Fish Oil 1000 MG Caps Take 2,000 mg by mouth in the morning, at noon, and at bedtime.   furosemide 20 MG tablet Commonly known as: LASIX TAKE 1 TABLET DAILY   gabapentin 100 MG capsule Commonly known as: NEURONTIN Take 2 capsules (200 mg total) by mouth 3 (three) times daily. What changed:  how much to take when to take this additional instructions   GLUCOSAMINE 1500 COMPLEX PO Take 1,500 mg by mouth daily.   hydrOXYzine 25 MG capsule Commonly known as: VISTARIL Take 1 capsule (25 mg total) by mouth every 8 (eight)  hours as needed.   magnesium oxide 400 MG tablet Commonly known as: MAG-OX Take 400 mg by mouth in the morning and at bedtime.   metoprolol succinate 100 MG 24 hr tablet Commonly known as: TOPROL-XL Take 1 tablet (100 mg total) by mouth 2 (two) times daily. Take with or immediately following a meal. What changed: when to take this   multivitamin with minerals Tabs tablet Take 0.5 tablets by mouth 2 (two) times daily.   omeprazole 20 MG capsule Commonly known as: PRILOSEC Take 1 capsule (20 mg total) by mouth daily.   REFRESH DRY EYE THERAPY OP Place 1 drop into both eyes 2 (two) times daily.   Vitamin D3 125 MCG (5000 UT) Tabs Take 5,000 Units by mouth daily.   vitamin E 1000 UNIT capsule Take 1,000 Units by mouth 2 (two) times daily.   warfarin 3 MG tablet Commonly known as: COUMADIN Take 5-6 mg by mouth See admin instructions. Alternate 5 mg and  6 mg daily        If you experience worsening of your admission symptoms, develop shortness of breath, life threatening emergency, suicidal or homicidal thoughts you must seek medical attention immediately by calling 911 or calling your MD immediately  if symptoms less severe.  You Must read complete instructions/literature along with all the possible adverse reactions/side effects for all the Medicines you take and that have been prescribed to you. Take any new Medicines after you have completely understood and accept all the possible adverse reactions/side effects.   Please note  You were cared for by a hospitalist during your hospital stay. If you have any questions about your discharge medications or the care you received while you were in the hospital after you are discharged, you can call the unit and asked to speak with the hospitalist on call if the hospitalist that took care of you is not available. Once you are discharged, your primary care physician will handle any further medical issues. Please note that NO REFILLS for  any discharge medications will be authorized once you are discharged, as it is imperative that you return to your primary care physician (or establish a relationship with a primary care physician if you do not have one) for your aftercare needs so that they can reassess your need for medications and monitor your lab values. Today   SUBJECTIVE   doing well. Post cardioversion. Eager to go home.  VITAL SIGNS:  Blood pressure 123/81, pulse 62, temperature 97.7 F (36.5 C), resp. rate 18, height 5\' 6"  (1.676 m), weight 83.5 kg, SpO2 96 %.  I/O:   Intake/Output Summary (Last 24 hours) at 10/09/2021 1151 Last data filed at 10/09/2021 0850 Gross per 24 hour  Intake 533.6 ml  Output 1000 ml  Net -466.4 ml    PHYSICAL EXAMINATION:  GENERAL:  80 y.o.-year-old patient lying in the bed with no acute distress.  LUNGS: decreased breath sounds bilaterally, no wheezing, rales,rhonchi or crepitation. No use of accessory muscles of respiration.  CARDIOVASCULAR: S1, S2 normal. No murmurs, rubs, or gallops.  ABDOMEN: Soft, non-tender, non-distended. Bowel sounds present. No organomegaly or mass.  EXTREMITIES: No pedal edema, cyanosis, or clubbing.  NEUROLOGIC: non-focal PSYCHIATRIC:  patient is alert and oriented x 3.  SKIN: No obvious rash, lesion, or ulcer.   DATA REVIEW:   CBC  Recent Labs  Lab 10/08/21 0819  WBC 9.6  HGB 13.0  HCT 38.3  PLT 248    Chemistries  Recent Labs  Lab 10/07/21 1553 10/07/21 2030 10/08/21 0819  NA 129*  --  134*  K 4.0  --  3.6  CL 96*  --  99  CO2 23  --  27  GLUCOSE 167*  --  115*  BUN 20  --  21  CREATININE 0.82  --  0.94  CALCIUM 8.8*  --  8.6*  MG  --  2.0  --   AST 84*  --   --   ALT 126*  --   --   ALKPHOS 87  --   --   BILITOT 2.0*  --   --     Microbiology Results   Recent Results (from the past 240 hour(s))  Resp Panel by RT-PCR (Flu A&B, Covid) Nasopharyngeal Swab     Status: None   Collection Time: 10/07/21  3:53 PM   Specimen:  Nasopharyngeal Swab; Nasopharyngeal(NP) swabs in vial transport medium  Result Value Ref Range Status   SARS Coronavirus 2 by  RT PCR NEGATIVE NEGATIVE Final    Comment: (NOTE) SARS-CoV-2 target nucleic acids are NOT DETECTED.  The SARS-CoV-2 RNA is generally detectable in upper respiratory specimens during the acute phase of infection. The lowest concentration of SARS-CoV-2 viral copies this assay can detect is 138 copies/mL. A negative result does not preclude SARS-Cov-2 infection and should not be used as the sole basis for treatment or other patient management decisions. A negative result may occur with  improper specimen collection/handling, submission of specimen other than nasopharyngeal swab, presence of viral mutation(s) within the areas targeted by this assay, and inadequate number of viral copies(<138 copies/mL). A negative result must be combined with clinical observations, patient history, and epidemiological information. The expected result is Negative.  Fact Sheet for Patients:  EntrepreneurPulse.com.au  Fact Sheet for Healthcare Providers:  IncredibleEmployment.be  This test is no t yet approved or cleared by the [place] FDA and  has been authorized for detection and/or diagnosis of SARS-CoV-2 by FDA under an Emergency Use Authorization (EUA). This EUA will remain  in effect (meaning this test can be used) for the duration of the COVID-19 declaration under Section 564(b)(1) of the Act, 21 U.S.C.section 360bbb-3(b)(1), unless the authorization is terminated  or revoked sooner.       Influenza A by PCR NEGATIVE NEGATIVE Final   Influenza B by PCR NEGATIVE NEGATIVE Final    Comment: (NOTE) The Xpert Xpress SARS-CoV-2/FLU/RSV plus assay is intended as an aid in the diagnosis of influenza from Nasopharyngeal swab specimens and should not be used as a sole basis for treatment. Nasal washings and aspirates are unacceptable for  Xpert Xpress SARS-CoV-2/FLU/RSV testing.  Fact Sheet for Patients: EntrepreneurPulse.com.au  Fact Sheet for Healthcare Providers: IncredibleEmployment.be  This test is not yet approved or cleared by the [place] FDA and has been authorized for detection and/or diagnosis of SARS-CoV-2 by FDA under an Emergency Use Authorization (EUA). This EUA will remain in effect (meaning this test can be used) for the duration of the COVID-19 declaration under Section 564(b)(1) of the Act, 21 U.S.C. section 360bbb-3(b)(1), unless the authorization is terminated or revoked.  Performed at [facility], [address], [place], [place] [address]     RADIOLOGY:  DG Chest Portable 1 View  Result Date: 10/07/2021 CLINICAL DATA:  Shortness of breath. EXAM: PORTABLE CHEST 1 VIEW COMPARISON:  January 29, 2021 FINDINGS: Injectable port in stable position. Calcific atherosclerotic disease of the aorta. Enlarged cardiac silhouette. Small bilateral pleural effusions. Mild interstitial pulmonary edema. IMPRESSION: Enlarged cardiac silhouette with mild interstitial pulmonary edema and small bilateral pleural effusions. Electronically Signed   By: Fidela Salisbury M.D.   On: 10/07/2021 16:58   ECHOCARDIOGRAM COMPLETE  Result Date: 10/08/2021    ECHOCARDIOGRAM REPORT   Patient Name:   LANAYAH GARTLEY Date of Exam: 10/08/2021 Medical Rec #:  [id number]                Height:       66.0 in Accession #:    [id number]               Weight:       185.2 lb Date of Birth:  1941/08/30                BSA:          1.935 m Patient Age:    12 years                 BP:  122/91 mmHg Patient Gender: F                        HR:           123 bpm. Exam Location:  [facility] Procedure: 2D Echo, Color Doppler and Cardiac Doppler Indications:     Atrial Fibrillation I48.91  History:         Patient has prior history of Echocardiogram examinations, most                  recent  01/31/2021. Arrythmias:Atrial Fibrillation; Risk                  Factors:Dyslipidemia. Moderate Mitral regugitation,                  Mild tricuspid regurgitation.  Sonographer:     Sherrie Sport Referring Phys:  [id number] AMY N COX Diagnosing Phys: [name] MD  Sonographer Comments: Suboptimal apical window. IMPRESSIONS  1. Left ventricular ejection fraction, by estimation, is 35 to 40%. The left ventricle has moderately decreased function. The left ventricle demonstrates global hypokinesis. The left ventricular internal cavity size was mildly dilated. There is mild left ventricular hypertrophy. Left ventricular diastolic function could not be evaluated.  2. Right ventricular systolic function is normal. The right ventricular size is normal.  3. Left atrial size was mildly dilated.  4. Right atrial size was mildly dilated.  5. The mitral valve is normal in structure. Moderate to severe mitral valve regurgitation.  6. The aortic valve is normal in structure. Aortic valve regurgitation is not visualized. FINDINGS  Left Ventricle: Left ventricular ejection fraction, by estimation, is 35 to 40%. The left ventricle has moderately decreased function. The left ventricle demonstrates global hypokinesis. The left ventricular internal cavity size was mildly dilated. There is mild left ventricular hypertrophy. Left ventricular diastolic function could not be evaluated. Right Ventricle: The right ventricular size is normal. No increase in right ventricular wall thickness. Right ventricular systolic function is normal. Left Atrium: Left atrial size was mildly dilated. Right Atrium: Right atrial size was mildly dilated. Pericardium: There is no evidence of pericardial effusion. Mitral Valve: The mitral valve is normal in structure. Moderate to severe mitral valve regurgitation. Tricuspid Valve: The tricuspid valve is normal in structure. Tricuspid valve regurgitation is mild. Aortic Valve: The aortic valve is normal in  structure. Aortic valve regurgitation is not visualized. Aortic valve mean gradient measures 1.0 mmHg. Aortic valve peak gradient measures 2.1 mmHg. Aortic valve area, by VTI measures 1.78 cm. Pulmonic Valve: The pulmonic valve was normal in structure. Pulmonic valve regurgitation is not visualized. Aorta: The aortic root and ascending aorta are structurally normal, with no evidence of dilitation. IAS/Shunts: No atrial level shunt detected by color flow Doppler.  LEFT VENTRICLE PLAX 2D LVIDd:         4.80 cm LVIDs:         4.10 cm LV PW:         0.90 cm LV IVS:        1.05 cm LVOT diam:     2.00 cm LV SV:         18 LV SV Index:   9 LVOT Area:     3.14 cm  RIGHT VENTRICLE RV Basal diam:  4.60 cm RV S prime:     10.10 cm/s TAPSE (M-mode): 2.8 cm LEFT ATRIUM  Index        RIGHT ATRIUM           Index LA diam:        4.80 cm  2.48 cm/m   RA Area:     31.00 cm LA Vol (A2C):   172.0 ml 88.87 ml/m  RA Volume:   110.00 ml 56.83 ml/m LA Vol (A4C):   95.0 ml  49.08 ml/m LA Biplane Vol: 131.0 ml 67.68 ml/m  AORTIC VALVE                    PULMONIC VALVE AV Area (Vmax):    1.65 cm     PV Vmax:       0.33 m/s AV Area (Vmean):   1.55 cm     PV Vmean:      24.900 cm/s AV Area (VTI):     1.78 cm     PV VTI:        0.049 m AV Vmax:           73.20 cm/s   PV Peak grad:  0.4 mmHg AV Vmean:          54.600 cm/s  PV Mean grad:  0.0 mmHg AV VTI:            0.103 m AV Peak Grad:      2.1 mmHg AV Mean Grad:      1.0 mmHg LVOT Vmax:         38.50 cm/s LVOT Vmean:        27.000 cm/s LVOT VTI:          0.058 m LVOT/AV VTI ratio: 0.57  AORTA Ao Root diam: 3.48 cm MITRAL VALVE               TRICUSPID VALVE MV Area (PHT): 4.99 cm    TR Peak grad:   24.2 mmHg MV Decel Time: 152 msec    TR Vmax:        246.00 cm/s MV E velocity: 87.80 cm/s                            SHUNTS                            Systemic VTI:  0.06 m                            Systemic Diam: 2.00 cm [name] MD Electronically signed by [name] MD Signature Date/Time: 10/08/2021/1:33:20 PM    Final    US ABDOMEN LIMITED RUQ (LIVER/GB)  Result Date: 10/07/2021 CLINICAL DATA:  Hyperbilirubinemia. EXAM: ULTRASOUND ABDOMEN LIMITED RIGHT UPPER QUADRANT COMPARISON:  July 21, 2017 FINDINGS: Gallbladder: No gallstones or wall thickening visualized. No sonographic Murphy sign noted by sonographer. Common bile duct: Diameter: 3 mm Liver: No focal lesion identified. Within normal limits in parenchymal echogenicity. Portal vein is patent on color Doppler imaging with normal direction of blood flow towards the liver. Other: Right pleural effusion. IMPRESSION: 1. Unremarkable sonographic appearance of the liver and gallbladder. No biliary ductal dilatation. 2. Right pleural effusion. Electronically Signed   By: Dahlia Bailiff M.D.   On: 10/07/2021 19:42     CODE STATUS:     Code Status Orders  (From admission, onward)  Start     Ordered   10/07/21 1933  Full code  Continuous        10/07/21 1934           Code Status History     Date Active Date Inactive Code Status Order ID Comments User Context   01/30/2021 0517 02/02/2021 0743 Full Code [id number]  Elwyn Reach, MD ED      Advance Directive Documentation    [place] Most Recent Value  Type of Advance Directive Living will  Pre-existing out of facility DNR order (yellow form or pink MOST form) --  "MOST" Form in Place? --        TOTAL TIME TAKING CARE OF THIS PATIENT: 40 minutes.    Fritzi Mandes M.D  [facility]    CC: Primary care physician; Crecencio Mc, MD

## 2021-10-09 NOTE — Anesthesia Postprocedure Evaluation (Signed)
Anesthesia Post Note  Patient: Carol [name]  Procedure(s) Performed: CARDIOVERSION TRANSESOPHAGEAL ECHOCARDIOGRAM (TEE)  Patient location during evaluation: PACU Anesthesia Type: General Level of consciousness: awake and alert, oriented and patient cooperative Pain management: pain level controlled Vital Signs Assessment: post-procedure vital signs reviewed and stable Respiratory status: spontaneous breathing, nonlabored ventilation and respiratory function stable Cardiovascular status: blood pressure returned to baseline and stable Postop Assessment: adequate PO intake Anesthetic complications: no   No notable events documented.   Last Vitals:  Vitals:   10/09/21 0900 10/09/21 0915  BP: (!) 77/45 96/63  Pulse: (!) 56 (!) 57  Resp: 15 17  Temp:    SpO2: 96% 94%    Last Pain:  Vitals:   10/09/21 0915  TempSrc:   PainSc: 0-No pain                 Darrin Nipper

## 2021-10-09 NOTE — Progress Notes (Signed)
[facility] Encounter Note  Patient: Carol [name] / Admit Date: 10/07/2021 / Date of Encounter: 10/09/2021, 8:45 AM   Subjective: Patient overall has felt well overnight with no evidence of congestive heart failure symptoms with better heart rate control of atrial fibrillation. Transesophageal echocardiogram showing moderate global LV systolic dysfunction with ejection fraction of 35% with moderate biatrial enlargement and spontaneous contrast of left atrium but no evidence of left atrial appendage thrombus.  There was no significant valvular heart disease Patient underwent electrical cardioversion to normal sinus rhythm without complication  Review of Systems: Positive for: Shortness of breath Negative for: Vision change, hearing change, syncope, dizziness, nausea, vomiting,diarrhea, bloody stool, stomach pain, cough, congestion, diaphoresis, urinary frequency, urinary pain,skin lesions, skin rashes Others previously listed  Objective: Telemetry: Normal sinus rhythm with PVCs Physical Exam: Blood pressure 104/74, pulse (!) 101, temperature 97.8 F (36.6 C), temperature source Oral, resp. rate (!) 22, height 5\' 6"  (1.676 m), weight 83.5 kg, SpO2 97 %. Body mass index is 29.71 kg/m. General: Well developed, well nourished, in no acute distress. Head: Normocephalic, atraumatic, sclera non-icteric, no xanthomas, nares are without discharge. Neck: No apparent masses Lungs: Normal respirations with no wheezes, no rhonchi, no rales , few crackles   Heart: Regular rate and rhythm, normal S1 S2, no murmur, no rub, no gallop, PMI is normal size and placement, carotid upstroke normal without bruit, jugular venous pressure normal Abdomen: Soft, non-tender, non-distended with normoactive bowel sounds. No hepatosplenomegaly. Abdominal aorta is normal size without bruit Extremities: No edema, no clubbing, no cyanosis, no ulcers,  Peripheral: 2+ radial, 2+ femoral, 2+  dorsal pedal pulses Neuro: Alert and oriented. Moves all extremities spontaneously. Psych:  Responds to questions appropriately with a normal affect.   Intake/Output Summary (Last 24 hours) at 10/09/2021 0845 Last data filed at 10/09/2021 0500 Gross per 24 hour  Intake 133.6 ml  Output 1000 ml  Net -866.4 ml    Inpatient Medications:   [MAR Hold] amiodarone  200 mg Oral BID   butamben-tetracaine-benzocaine       [MAR Hold] chlorpheniramine-HYDROcodone  5 mL Oral QHS   [MAR Hold] cholecalciferol  5,000 Units Oral Daily   [MAR Hold] diltiazem  240 mg Oral Daily   [MAR Hold] furosemide  20 mg Intravenous BID   [MAR Hold] gabapentin  100 mg Oral Daily   [MAR Hold] gabapentin  200 mg Oral QHS   influenza vaccine adjuvanted  0.5 mL Intramuscular Tomorrow-1000   [MAR Hold] insulin aspart  0-15 Units Subcutaneous TID WC   [MAR Hold] insulin aspart  0-5 Units Subcutaneous QHS   lidocaine       [MAR Hold] magnesium oxide  400 mg Oral BID   [MAR Hold] metoprolol succinate  100 mg Oral BID   [MAR Hold] multivitamin with minerals  0.5 tablet Oral BID   [MAR Hold] pantoprazole  40 mg Oral Daily   [MAR Hold] Vitamin E  800 Units Oral BID   warfarin  2.5 mg Oral ONCE-1600   [MAR Hold] Warfarin - Pharmacist Dosing Inpatient   Does not apply q1600   Infusions:   sodium chloride     sodium chloride     diltiazem (CARDIZEM) infusion 12.5 mg/hr (10/09/21 0440)    Labs: Recent Labs    10/07/21 1553 10/07/21 2030 10/08/21 0819  NA 129*  --  134*  K 4.0  --  3.6  CL 96*  --  99  CO2 23  --  27  GLUCOSE 167*  --  115*  BUN 20  --  21  CREATININE 0.82  --  0.94  CALCIUM 8.8*  --  8.6*  MG  --  2.0  --   PHOS  --  4.0  --    Recent Labs    10/07/21 1553  AST 84*  ALT 126*  ALKPHOS 87  BILITOT 2.0*  PROT 6.7  ALBUMIN 3.8   Recent Labs    10/07/21 1553 10/08/21 0819  WBC 10.8* 9.6  NEUTROABS 8.1*  --   HGB 12.8 13.0  HCT 38.6 38.3  MCV 100.0 97.2  PLT 260 248   No  results for input(s): CKTOTAL, CKMB, TROPONINI in the last 72 hours. Invalid input(s): POCBNP Recent Labs    10/08/21 0819  HGBA1C 5.7*     Weights: Filed Weights   10/07/21 1553 10/08/21 1519  Weight: 84 kg 83.5 kg     Radiology/Studies:  DG Chest Portable 1 View  Result Date: 10/07/2021 CLINICAL DATA:  Shortness of breath. EXAM: PORTABLE CHEST 1 VIEW COMPARISON:  January 29, 2021 FINDINGS: Injectable port in stable position. Calcific atherosclerotic disease of the aorta. Enlarged cardiac silhouette. Small bilateral pleural effusions. Mild interstitial pulmonary edema. IMPRESSION: Enlarged cardiac silhouette with mild interstitial pulmonary edema and small bilateral pleural effusions. Electronically Signed   By: Fidela Salisbury M.D.   On: 10/07/2021 16:58   ECHOCARDIOGRAM COMPLETE  Result Date: 10/08/2021    ECHOCARDIOGRAM REPORT   Patient Name:   Carol [name] Date of Exam: 10/08/2021 Medical Rec #:  [id number]                Height:       66.0 in Accession #:    [id number]               Weight:       185.2 lb Date of Birth:  July 18, 1941                BSA:          1.935 m Patient Age:    80 years                 BP:           122/91 mmHg Patient Gender: F                        HR:           123 bpm. Exam Location:  [facility] Procedure: 2D Echo, Color Doppler and Cardiac Doppler Indications:     Atrial Fibrillation I48.91  History:         Patient has prior history of Echocardiogram examinations, most                  recent 01/31/2021. Arrythmias:Atrial Fibrillation; Risk                  Factors:Dyslipidemia. Moderate Mitral regugitation,                  Mild tricuspid regurgitation.  Sonographer:     Sherrie Sport Referring Phys:  [id number] AMY N COX Diagnosing Phys: [name] MD  Sonographer Comments: Suboptimal apical window. IMPRESSIONS  1. Left ventricular ejection fraction, by estimation, is 35 to 40%. The left ventricle has moderately decreased function. The left ventricle  demonstrates global hypokinesis. The left ventricular internal cavity size was mildly dilated. There is mild left ventricular hypertrophy. Left ventricular  diastolic function could not be evaluated.  2. Right ventricular systolic function is normal. The right ventricular size is normal.  3. Left atrial size was mildly dilated.  4. Right atrial size was mildly dilated.  5. The mitral valve is normal in structure. Moderate to severe mitral valve regurgitation.  6. The aortic valve is normal in structure. Aortic valve regurgitation is not visualized. FINDINGS  Left Ventricle: Left ventricular ejection fraction, by estimation, is 35 to 40%. The left ventricle has moderately decreased function. The left ventricle demonstrates global hypokinesis. The left ventricular internal cavity size was mildly dilated. There is mild left ventricular hypertrophy. Left ventricular diastolic function could not be evaluated. Right Ventricle: The right ventricular size is normal. No increase in right ventricular wall thickness. Right ventricular systolic function is normal. Left Atrium: Left atrial size was mildly dilated. Right Atrium: Right atrial size was mildly dilated. Pericardium: There is no evidence of pericardial effusion. Mitral Valve: The mitral valve is normal in structure. Moderate to severe mitral valve regurgitation. Tricuspid Valve: The tricuspid valve is normal in structure. Tricuspid valve regurgitation is mild. Aortic Valve: The aortic valve is normal in structure. Aortic valve regurgitation is not visualized. Aortic valve mean gradient measures 1.0 mmHg. Aortic valve peak gradient measures 2.1 mmHg. Aortic valve area, by VTI measures 1.78 cm. Pulmonic Valve: The pulmonic valve was normal in structure. Pulmonic valve regurgitation is not visualized. Aorta: The aortic root and ascending aorta are structurally normal, with no evidence of dilitation. IAS/Shunts: No atrial level shunt detected by color flow Doppler.  LEFT  VENTRICLE PLAX 2D LVIDd:         4.80 cm LVIDs:         4.10 cm LV PW:         0.90 cm LV IVS:        1.05 cm LVOT diam:     2.00 cm LV SV:         18 LV SV Index:   9 LVOT Area:     3.14 cm  RIGHT VENTRICLE RV Basal diam:  4.60 cm RV S prime:     10.10 cm/s TAPSE (M-mode): 2.8 cm LEFT ATRIUM              Index        RIGHT ATRIUM           Index LA diam:        4.80 cm  2.48 cm/m   RA Area:     31.00 cm LA Vol (A2C):   172.0 ml 88.87 ml/m  RA Volume:   110.00 ml 56.83 ml/m LA Vol (A4C):   95.0 ml  49.08 ml/m LA Biplane Vol: 131.0 ml 67.68 ml/m  AORTIC VALVE                    PULMONIC VALVE AV Area (Vmax):    1.65 cm     PV Vmax:       0.33 m/s AV Area (Vmean):   1.55 cm     PV Vmean:      24.900 cm/s AV Area (VTI):     1.78 cm     PV VTI:        0.049 m AV Vmax:           73.20 cm/s   PV Peak grad:  0.4 mmHg AV Vmean:          54.600 cm/s  PV Mean grad:  0.0 mmHg  AV VTI:            0.103 m AV Peak Grad:      2.1 mmHg AV Mean Grad:      1.0 mmHg LVOT Vmax:         38.50 cm/s LVOT Vmean:        27.000 cm/s LVOT VTI:          0.058 m LVOT/AV VTI ratio: 0.57  AORTA Ao Root diam: 3.48 cm MITRAL VALVE               TRICUSPID VALVE MV Area (PHT): 4.99 cm    TR Peak grad:   24.2 mmHg MV Decel Time: 152 msec    TR Vmax:        246.00 cm/s MV E velocity: 87.80 cm/s                            SHUNTS                            Systemic VTI:  0.06 m                            Systemic Diam: 2.00 cm [name] MD Electronically signed by [name] MD Signature Date/Time: 10/08/2021/1:33:20 PM    Final    US ABDOMEN LIMITED RUQ (LIVER/GB)  Result Date: 10/07/2021 CLINICAL DATA:  Hyperbilirubinemia. EXAM: ULTRASOUND ABDOMEN LIMITED RIGHT UPPER QUADRANT COMPARISON:  July 21, 2017 FINDINGS: Gallbladder: No gallstones or wall thickening visualized. No sonographic Murphy sign noted by sonographer. Common bile duct: Diameter: 3 mm Liver: No focal lesion identified. Within normal limits in parenchymal  echogenicity. Portal vein is patent on color Doppler imaging with normal direction of blood flow towards the liver. Other: Right pleural effusion. IMPRESSION: 1. Unremarkable sonographic appearance of the liver and gallbladder. No biliary ductal dilatation. 2. Right pleural effusion. Electronically Signed   By: Dahlia Bailiff M.D.   On: 10/07/2021 19:42     Assessment and Recommendation  80 y.o. female with known paroxysmal nonvalvular atrial fibrillation status post ablation earlier this year with an acute bronchitis type symptoms earlier in the week and atrial fibrillation with rapid ventricular rate with moderate LV systolic dysfunction likely secondary to the atrial fibrillation with rapid rate.  Patient has improved with congestive heart failure symptoms with intravenous Lasix and electrical cardioversion to normal sinus rhythm 1.  Continuation of oral amiodarone at 200 mg twice per day for maintenance of normal sinus rhythm 2.  Continuation of diltiazem metoprolol combination orally but will discontinue intravenous diltiazem at this time 3.  Continuation of anticoagulation with warfarin with goal INR between 2 and 3 4.  No further cardiac diagnostics necessary at this time 5.  Continue low-dose oral Lasix at this time for acute systolic dysfunction congestive heart failure secondary to atrial fibrillation with rapid ventricular rate now improved 6.  If ambulating well late this evening with no evidence of further significant symptoms and maintaining normal sinus rhythm would be okay for discharged home from cardiac standpoint with follow-up next week  Signed, [name] M.D. FACC

## 2021-10-10 ENCOUNTER — Telehealth: Payer: Self-pay

## 2021-10-10 ENCOUNTER — Encounter: Payer: Self-pay | Admitting: Internal Medicine

## 2021-10-10 NOTE — Telephone Encounter (Signed)
Transition Care Management Unsuccessful Follow-up Telephone Call  Date of discharge and from where:  10/09/21  Attempts:  1st Attempt  Reason for unsuccessful TCM follow-up call:  Unable to reach patient. Will follow.

## 2021-10-11 MED ORDER — CHERATUSSIN AC 100-10 MG/5ML PO SOLN: 5.0000 mL | Freq: Three times a day (TID) | ORAL | 0 refills | Status: DC | PRN

## 2021-10-11 NOTE — Telephone Encounter (Signed)
Cheratussin sent to pharmacy in chart  [facility]

## 2021-10-11 NOTE — Telephone Encounter (Signed)
Transition Care Management Follow-up Telephone Call Date of discharge and from where: 10/10/21-[facility] How have you been since you were released from the hospital? Patient reports intermittent shortness of breath with movement which leaves her tired. Chest congestion with clear or pale yellow phlegm. Dry cough. Taking Robitussin which is not helping at bedtime. Does not take cough pearls because they do not work for her. Denies headache, blurred vision, fever, dizziness, chest pain/pressure. Currently having another Shingles outbreak onset 2-3 weeks ago; taking gabapentin for nerve pain as 1 in the morning and 2 at night. Okay to reschedule as video visit if preferred by pcp but would rather come in. Any questions or concerns? Can a cough suppressant with codeine be sent to local [facility] on file to take at bedtime? Rested better when given this in the hospital.   Items Reviewed: Did the pt receive and understand the discharge instructions provided? Yes  Medications obtained and verified? Yes  Any new allergies since your discharge? No  Dietary orders reviewed? Yes, low salt Do you have support at home? Yes   Home Care and Equipment/Supplies: Were home health services ordered? no  Functional Questionnaire: (I = Independent and D = Dependent) ADLs: I  Follow up appointments reviewed:  PCP Hospital f/u appt confirmed? Yes  Scheduled to see PCP on 10/15/21 @ 9:30. [facility] f/u appt confirmed? Yes  Scheduled to see Cardiology on 12/16.  Are transportation arrangements needed? No  If their condition worsens, is the pt aware to call PCP or go to the Emergency Dept.? Yes Was the patient provided with contact information for the PCP's office or ED? Yes Was to pt encouraged to call back with questions or concerns? Yes

## 2021-10-11 NOTE — Addendum Note (Signed)
Addended by: Crecencio Mc on: 10/11/2021 08:33 PM   Modules accepted: Orders

## 2021-10-12 NOTE — Telephone Encounter (Signed)
Spoke with pt to let her know of the medication that has been sent in for her.

## 2021-10-15 ENCOUNTER — Encounter: Payer: Self-pay | Admitting: Internal Medicine

## 2021-10-15 ENCOUNTER — Ambulatory Visit (INDEPENDENT_AMBULATORY_CARE_PROVIDER_SITE_OTHER): Payer: Medicare Other | Admitting: Internal Medicine

## 2021-10-15 ENCOUNTER — Ambulatory Visit (INDEPENDENT_AMBULATORY_CARE_PROVIDER_SITE_OTHER): Payer: Medicare Other

## 2021-10-15 ENCOUNTER — Other Ambulatory Visit: Payer: Self-pay

## 2021-10-15 VITALS — BP 130/84 | HR 64 | Temp 97.5°F | Ht 66.0 in | Wt 179.6 lb

## 2021-10-15 DIAGNOSIS — R7401 Elevation of levels of liver transaminase levels: Secondary | ICD-10-CM

## 2021-10-15 DIAGNOSIS — D6869 Other thrombophilia: Secondary | ICD-10-CM

## 2021-10-15 DIAGNOSIS — R059 Cough, unspecified: Secondary | ICD-10-CM | POA: Diagnosis not present

## 2021-10-15 DIAGNOSIS — R0602 Shortness of breath: Secondary | ICD-10-CM | POA: Diagnosis not present

## 2021-10-15 DIAGNOSIS — I4891 Unspecified atrial fibrillation: Secondary | ICD-10-CM

## 2021-10-15 DIAGNOSIS — I517 Cardiomegaly: Secondary | ICD-10-CM | POA: Diagnosis not present

## 2021-10-15 DIAGNOSIS — R051 Acute cough: Secondary | ICD-10-CM

## 2021-10-15 DIAGNOSIS — J811 Chronic pulmonary edema: Secondary | ICD-10-CM | POA: Diagnosis not present

## 2021-10-15 DIAGNOSIS — Z09 Encounter for follow-up examination after completed treatment for conditions other than malignant neoplasm: Secondary | ICD-10-CM | POA: Diagnosis not present

## 2021-10-15 DIAGNOSIS — I4811 Longstanding persistent atrial fibrillation: Secondary | ICD-10-CM | POA: Diagnosis not present

## 2021-10-15 DIAGNOSIS — R600 Localized edema: Secondary | ICD-10-CM

## 2021-10-15 LAB — CBC WITH DIFFERENTIAL/PLATELET
Basophils Absolute: 0.1 10*3/uL (ref 0.0–0.1)
Basophils Relative: 0.9 % (ref 0.0–3.0)
Eosinophils Absolute: 0.2 10*3/uL (ref 0.0–0.7)
Eosinophils Relative: 3.3 % (ref 0.0–5.0)
HCT: 39 % (ref 36.0–46.0)
Hemoglobin: 13.2 g/dL (ref 12.0–15.0)
Lymphocytes Relative: 22.1 % (ref 12.0–46.0)
Lymphs Abs: 1.3 10*3/uL (ref 0.7–4.0)
MCHC: 33.8 g/dL (ref 30.0–36.0)
MCV: 97.6 fl (ref 78.0–100.0)
Monocytes Absolute: 0.6 10*3/uL (ref 0.1–1.0)
Monocytes Relative: 9.5 % (ref 3.0–12.0)
Neutro Abs: 3.9 10*3/uL (ref 1.4–7.7)
Neutrophils Relative %: 64.2 % (ref 43.0–77.0)
Platelets: 271 10*3/uL (ref 150.0–400.0)
RBC: 4 Mil/uL (ref 3.87–5.11)
RDW: 15.6 % — ABNORMAL HIGH (ref 11.5–15.5)
WBC: 6.1 10*3/uL (ref 4.0–10.5)

## 2021-10-15 LAB — COMPREHENSIVE METABOLIC PANEL
ALT: 54 U/L — ABNORMAL HIGH (ref 0–35)
AST: 35 U/L (ref 0–37)
Albumin: 3.9 g/dL (ref 3.5–5.2)
Alkaline Phosphatase: 80 U/L (ref 39–117)
BUN: 11 mg/dL (ref 6–23)
CO2: 31 mEq/L (ref 19–32)
Calcium: 8.9 mg/dL (ref 8.4–10.5)
Chloride: 102 mEq/L (ref 96–112)
Creatinine, Ser: 0.85 mg/dL (ref 0.40–1.20)
GFR: 64.39 mL/min (ref >60.00)
Glucose, Bld: 82 mg/dL (ref 70–99)
Potassium: 3.6 mEq/L (ref 3.5–5.1)
Sodium: 141 mEq/L (ref 135–145)
Total Bilirubin: 0.9 mg/dL (ref 0.2–1.2)
Total Protein: 6.1 g/dL (ref 6.0–8.3)

## 2021-10-15 LAB — BRAIN NATRIURETIC PEPTIDE: Pro B Natriuretic peptide (BNP): 1651 pg/mL — ABNORMAL HIGH (ref 0.0–100.0)

## 2021-10-15 MED ORDER — FUROSEMIDE 40 MG PO TABS: 40.0000 mg | ORAL_TABLET | Freq: Every day | ORAL | 1 refills | Status: DC

## 2021-10-15 MED ORDER — CHERATUSSIN AC 100-10 MG/5ML PO SOLN: 5.0000 mL | Freq: Three times a day (TID) | ORAL | 0 refills | Status: DC | PRN

## 2021-10-15 NOTE — Patient Instructions (Addendum)
Consolidate your lasix doses to morning only:  take   40 mg daily (starting today)   monitor  Your weight daily  in the morning after urinating and take an extra 20 mg  dose if only if your overnight weight gain is 2 lbs,  or your weekly weight gain is 5 lbs.

## 2021-10-15 NOTE — Progress Notes (Signed)
Subjective:  Patient ID: Carol [name], female    DOB: June 02, 1941  Age: 80 y.o. MRN: [id number]  CC: The primary encounter diagnosis was Acute cough. Diagnoses of Transaminitis, Shortness of breath, Leg edema, Atrial fibrillation with RVR ([place]), Longstanding persistent atrial fibrillation ([place]), Acquired thrombophilia ([place]), and Hospital discharge follow-up were also pertinent to this visit.  HPI Carol [name] presents for hospital follow up  Chief Complaint  Patient presents with   Hospitalization [facility] follow up on A. Fib.    This visit occurred during the SARS-CoV-2 public health emergency.  Safety protocols were in place, including screening questions prior to the visit, additional usage of staff PPE, and extensive cleaning of exam room while observing appropriate contact time as indicated for disinfecting solutions.   Admitted to [facility] on dec 4 and discharged home  on Dec 6 Admitting diagnoses :    1) acute resp failure with hypoxia secondary to acute on chronic CHF  caused by atrial fib with RVR.  Cxr noted pulmonary edema and bilateral effusions  She Was cardioverted on Dec 6 by Nehemiah Massed.  Diuresed with 80 mg lasix  dose and discharged home on daily dose of  20 mg lasix,  amiodarone 200 mg bid cardizem 240 mg and metoprolol 100 mg bid .  Ef 35 to 40%  by in house ECHO.  Taking warfarin  inr was 3.3 on dec 6  takes 6 mg alt with 5 mg   2) Transaminitis with Elevated Bili:  ultrasound of abd normal   Since discharge she has been  short of breath, still retaining fluid.  Dyspnea is worse when getting into bed but not when lying down.  Has been takig lasix 20 mg bid  to manage the fluid retention  Started coughing prior to discharge,  reports that her cough has been productive of purulent sputum.   Denies fevers,  facial pain and sinus congestion.      All labs , imaging studies and progress notes from admission were reviewed with patient today       Lab Results  Component Value Date   HGBA1C 5.7 (H) 10/08/2021      Outpatient Medications Prior to Visit  Medication Sig Dispense Refill   acetaminophen (TYLENOL) 500 MG tablet Take 500 mg by mouth every 6 (six) hours as needed for moderate pain or headache.     amiodarone (PACERONE) 200 MG tablet Take 200 mg by mouth 2 (two) times daily.     b complex vitamins capsule Take 1 capsule by mouth daily.     bisacodyl (DULCOLAX) 5 MG EC tablet Take 5 mg by mouth daily as needed for moderate constipation.     Cholecalciferol (VITAMIN D3) 125 MCG (5000 UT) TABS Take 5,000 Units by mouth daily.     diltiazem (CARDIZEM CD) 120 MG 24 hr capsule Take 2 capsules (240 mg total) by mouth daily. 30 capsule 0   diphenhydrAMINE (BENADRYL) 25 mg capsule Take 25 mg by mouth every 6 (six) hours as needed for allergies.     docusate sodium (COLACE) 100 MG capsule Take 100 mg by mouth daily as needed for mild constipation.     gabapentin (NEURONTIN) 100 MG capsule Take 2 capsules (200 mg total) by mouth 3 (three) times daily. (Patient taking differently: Take 100 mg by mouth daily. Patient taking 100 mg in the morning and 200 mg at night.) 540 capsule 1   Glucosamine-Chondroit-Vit C-Mn (GLUCOSAMINE 1500 COMPLEX PO) Take  1,500 mg by mouth daily.     Glycerin-Polysorbate 80 (REFRESH DRY EYE THERAPY OP) Place 1 drop into both eyes 2 (two) times daily.     hydrOXYzine (VISTARIL) 25 MG capsule Take 1 capsule (25 mg total) by mouth every 8 (eight) hours as needed. 30 capsule 0   magnesium oxide (MAG-OX) 400 MG tablet Take 400 mg by mouth in the morning and at bedtime.     metoprolol succinate (TOPROL-XL) 50 MG 24 hr tablet Take 100 mg by mouth daily.     Multiple Vitamin (MULTIVITAMIN WITH MINERALS) TABS tablet Take 0.5 tablets by mouth 2 (two) times daily.     Omega-3 Fatty Acids (FISH OIL) 1000 MG CAPS Take 2,000 mg by mouth in the morning, at noon, and at bedtime.     omeprazole (PRILOSEC) 20 MG capsule Take 1  capsule (20 mg total) by mouth daily. 90 capsule 1   vitamin E 1000 UNIT capsule Take 1,000 Units by mouth 2 (two) times daily.     warfarin (COUMADIN) 3 MG tablet Take 5-6 mg by mouth See admin instructions. Alternate 5 mg and 6 mg daily     warfarin (COUMADIN) 6 MG tablet Take 6 mg by mouth daily.     furosemide (LASIX) 20 MG tablet TAKE 1 TABLET DAILY 90 tablet 0   guaiFENesin-codeine (CHERATUSSIN AC) 100-10 MG/5ML syrup Take 5 mLs by mouth 3 (three) times daily as needed for cough. 120 mL 0   metoprolol succinate (TOPROL-XL) 100 MG 24 hr tablet Take 1 tablet (100 mg total) by mouth 2 (two) times daily. Take with or immediately following a meal. (Patient not taking: Reported on 10/15/2021)     No facility-administered medications prior to visit.    Review of Systems;  Patient denies headache, fevers, malaise, unintentional weight loss, skin rash, eye pain, sinus congestion and sinus pain, sore throat, dysphagia,  hemoptysis,  chest pain, palpitations, orthopnea, edema, abdominal pain, nausea, melena, diarrhea, constipation, flank pain, dysuria, hematuria, urinary  Frequency, nocturia, numbness, tingling, seizures,  Focal weakness, Loss of consciousness,  Tremor, insomnia, depression, anxiety, and suicidal ideation.      Objective:  BP 130/84 (BP Location: Left Arm, Patient Position: Sitting, Cuff Size: Normal)   Pulse 64   Temp (!) 97.5 F (36.4 C) (Temporal)   Ht 5\' 6"  (1.676 m)   Wt 179 lb 9.6 oz (81.5 kg)   SpO2 99%   BMI 28.99 kg/m   BP Readings from Last 3 Encounters:  10/15/21 130/84  10/09/21 123/81  09/21/21 114/86    Wt Readings from Last 3 Encounters:  10/15/21 179 lb 9.6 oz (81.5 kg)  10/08/21 184 lb 1.4 oz (83.5 kg)  09/21/21 182 lb (82.6 kg)    General appearance: alert, cooperative and appears stated age Ears: normal TM's and external ear canals both ears Throat: lips, mucosa, and tongue normal; teeth and gums normal Neck: no adenopathy, no carotid bruit,  supple, symmetrical, trachea midline and thyroid not enlarged, symmetric, no tenderness/mass/nodules Back: symmetric, no curvature. ROM normal. No CVA tenderness. Lungs: clear to auscultation bilaterally Heart: regular rate and rhythm, S1, S2 normal, no murmur, click, rub or gallop Abdomen: soft, non-tender; bowel sounds normal; no masses,  no organomegaly Pulses: 2+ and symmetric Skin: Skin color, texture, turgor normal. No rashes or lesions Lymph nodes: Cervical, supraclavicular, and axillary nodes normal.  Lab Results  Component Value Date   HGBA1C 5.7 (H) 10/08/2021   HGBA1C 5.7 (H) 10/07/2021   HGBA1C 5.7 05/27/2016  Lab Results  Component Value Date   CREATININE 0.85 10/15/2021   CREATININE 0.94 10/08/2021   CREATININE 0.82 10/07/2021    Lab Results  Component Value Date   WBC 6.1 10/15/2021   HGB 13.2 10/15/2021   HCT 39.0 10/15/2021   PLT 271.0 10/15/2021   GLUCOSE 82 10/15/2021   CHOL 223 (H) 04/11/2021   TRIG 91.0 04/11/2021   HDL 62.20 04/11/2021   LDLDIRECT 156.0 04/05/2020   LDLCALC 143 (H) 04/11/2021   ALT 54 (H) 10/15/2021   AST 35 10/15/2021   NA 141 10/15/2021   K 3.6 10/15/2021   CL 102 10/15/2021   CREATININE 0.85 10/15/2021   BUN 11 10/15/2021   CO2 31 10/15/2021   TSH 2.704 10/07/2021   INR 3.3 (H) 10/09/2021   HGBA1C 5.7 (H) 10/08/2021    ECHO TEE  Result Date: 10/09/2021    TRANSESOPHOGEAL ECHO REPORT   Patient Name:   KAMICA FLORANCE Date of Exam: 10/09/2021 Medical Rec #:  [id number]                Height:       66.0 in Accession #:    [id number]               Weight:       184.1 lb Date of Birth:  14-Oct-1941                BSA:          1.931 m Patient Age:    27 years                 BP:           83/53 mmHg Patient Gender: F                        HR:           46 bpm. Exam Location:  [facility] Procedure: Transesophageal Echo, Cardiac Doppler and Color Doppler Indications:     Not listed under reason for exam on TEE check-in  History:          Patient has prior history of Echocardiogram examinations, most                  recent 10/08/2021. Arrythmias:Atrial Fibrillation; Risk                  Factors:Dyslipidemia.  Sonographer:     Sherrie Sport Referring Phys:  [place] Diagnosing Phys: [name] MD PROCEDURE: The transesophogeal probe was passed without difficulty through the esophogus of the patient. Sedation performed by performing physician. The patient developed no complications during the procedure. IMPRESSIONS  1. Left ventricular ejection fraction, by estimation, is 35 to 40%. The left ventricle has moderately decreased function. The left ventricle demonstrates global hypokinesis. The left ventricular internal cavity size was mildly dilated.  2. Right ventricular systolic function is normal. The right ventricular size is normal.  3. Left atrial size was moderately dilated. No left atrial/left atrial appendage thrombus was detected.  4. The mitral valve is normal in structure. Mild mitral valve regurgitation.  5. The aortic valve is normal in structure. Aortic valve regurgitation is trivial.  6. Agitated saline contrast bubble study was negative, with no evidence of any interatrial shunt. FINDINGS  Left Ventricle: Left ventricular ejection fraction, by estimation, is 35 to 40%. The left ventricle has moderately decreased function. The left ventricle demonstrates global hypokinesis.  The left ventricular internal cavity size was mildly dilated. Right Ventricle: The right ventricular size is normal. No increase in right ventricular wall thickness. Right ventricular systolic function is normal. Left Atrium: Left atrial size was moderately dilated. Spontaneous echo contrast was present. No left atrial/left atrial appendage thrombus was detected. Right Atrium: Right atrial size was normal in size. Pericardium: There is no evidence of pericardial effusion. Mitral Valve: The mitral valve is normal in structure. Mild mitral valve  regurgitation. Tricuspid Valve: The tricuspid valve is normal in structure. Tricuspid valve regurgitation is mild. Aortic Valve: The aortic valve is normal in structure. Aortic valve regurgitation is trivial. Pulmonic Valve: The pulmonic valve was normal in structure. Pulmonic valve regurgitation is trivial. Aorta: The aortic root and ascending aorta are structurally normal, with no evidence of dilitation. IAS/Shunts: No atrial level shunt detected by color flow Doppler. Agitated saline contrast bubble study was negative, with no evidence of any interatrial shunt. There is no evidence of a patent foramen ovale. There is no evidence of an atrial septal defect. [name] MD Electronically signed by [name] MD Signature Date/Time: 10/09/2021/1:13:46 PM    Final     Assessment & Plan:   Problem List Items Addressed This Visit     Transaminitis    History of fatty liver.  Current Liver ultrasound done in house showed no acute changes. and all enzymes are either normalized or trending down based on repeat done today   Lab Results  Component Value Date   ALT 54 (H) 10/15/2021   AST 35 10/15/2021   ALKPHOS 80 10/15/2021   BILITOT 0.9 10/15/2021         Relevant Orders   Comprehensive metabolic panel (Completed)   Leg edema    Pitting,  Noted today along with occasional rales on lung exam. .  Advised to consolidate lasix into a 40 mg morning dose and weigh daily       Hospital discharge follow-up    Patient is stable post discharge and has had all of her questions  about discharge plans addressed at the visit today for hospital follow up. All labs , imaging studies and progress notes from admission were reviewed with patient today        Atrial fibrillation with RVR ([place])    Rate is now controlled.  Warfarin level was supra  therapeutic  On Dec6 , managed by [facility]  Lab Results  Component Value Date   INR 3.3 (H) 10/09/2021   INR 2.9 (H) 10/08/2021   INR 3.0 (H) 10/07/2021          Relevant Medications   metoprolol succinate (TOPROL-XL) 50 MG 24 hr tablet   warfarin (COUMADIN) 6 MG tablet   furosemide (LASIX) 40 MG tablet   Atrial fibrillation ([place])    Persistent with recent admission for RVR .  Now with decreased systolic function per in house ECHO showing EF has fallen from 60% previously to 40% . Rate is currently controlled       Relevant Medications   metoprolol succinate (TOPROL-XL) 50 MG 24 hr tablet   warfarin (COUMADIN) 6 MG tablet   furosemide (LASIX) 40 MG tablet   Acute cough - Primary    With purulent sputum reported by patient   No other symptoms.  Repeat chest x ray notes no infiltrates but persistent PVC and cardiomegaly .  Will not prescribed abx but will  increase lasix dose to 40 mg qam and increase as needed to return weight  to baseline.       Relevant Orders   DG Chest 2 View (Completed)   CBC with Differential/Platelet (Completed)   Acquired thrombophilia ([place])    secondary to chronic atrial fibrillatin. Patient has no signs of bleeding and is advised to notify her specialists prior to any procedure that may required suspension of warfarin      Other Visit Diagnoses     Shortness of breath       Relevant Orders   B Nat Peptide (Completed)       I have changed Geryl Councilman. Cush's furosemide. I am also having her maintain her diphenhydrAMINE, magnesium oxide, vitamin E, Fish Oil, multivitamin with minerals, Vitamin D3, bisacodyl, Glycerin-Polysorbate 80 (REFRESH DRY EYE THERAPY OP), amiodarone, docusate sodium, Glucosamine-Chondroit-Vit C-Mn (GLUCOSAMINE 1500 COMPLEX PO), warfarin, b complex vitamins, acetaminophen, omeprazole, hydrOXYzine, gabapentin, diltiazem, metoprolol succinate, warfarin, and Cheratussin AC.  Meds ordered this encounter  Medications   guaiFENesin-codeine (CHERATUSSIN AC) 100-10 MG/5ML syrup    Sig: Take 5 mLs by mouth 3 (three) times daily as needed for cough.    Dispense:  120 mL    Refill:  0    furosemide (LASIX) 40 MG tablet    Sig: Take 1 tablet (40 mg total) by mouth daily. More if directed by physician    Dispense:  90 tablet    Refill:  1     I provided  30 minutes of  face-to-face time during this encounter reviewing patient's current problems and past surgeries, labs and imaging studies, providing counseling on the above mentioned problems , and coordination  of care .   Follow-up: No follow-ups on file.   Crecencio Mc, MD

## 2021-10-16 DIAGNOSIS — Z09 Encounter for follow-up examination after completed treatment for conditions other than malignant neoplasm: Secondary | ICD-10-CM | POA: Insufficient documentation

## 2021-10-16 DIAGNOSIS — R7401 Elevation of levels of liver transaminase levels: Secondary | ICD-10-CM | POA: Insufficient documentation

## 2021-10-16 NOTE — Assessment & Plan Note (Signed)
secondary to chronic atrial fibrillatin. Patient has no signs of bleeding and is advised to notify her specialists prior to any procedure that may required suspension of warfarin 

## 2021-10-16 NOTE — Assessment & Plan Note (Addendum)
With purulent sputum reported by patient   No other symptoms.  Repeat chest x ray notes no infiltrates but persistent PVC and cardiomegaly .  Will not prescribed abx but will  increase lasix dose to 40 mg qam and increase as needed to return weight to baseline.

## 2021-10-16 NOTE — Assessment & Plan Note (Signed)
Pitting,  Noted today along with occasional rales on lung exam. .  Advised to consolidate lasix into a 40 mg morning dose and weigh daily

## 2021-10-16 NOTE — Assessment & Plan Note (Signed)
Patient is stable post discharge and has had all of her questions  about discharge plans addressed at the visit today for hospital follow up. All labs , imaging studies and progress notes from admission were reviewed with patient today

## 2021-10-16 NOTE — Assessment & Plan Note (Signed)
Rate is now controlled.  Warfarin level was supra  therapeutic  On Dec6 , managed by [facility]  Lab Results  Component Value Date   INR 3.3 (H) 10/09/2021   INR 2.9 (H) 10/08/2021   INR 3.0 (H) 10/07/2021

## 2021-10-16 NOTE — Assessment & Plan Note (Signed)
Persistent with recent admission for RVR .  Now with decreased systolic function per in house ECHO showing EF has fallen from 60% previously to 40% . Rate is currently controlled

## 2021-10-16 NOTE — Assessment & Plan Note (Addendum)
History of fatty liver.  Current Liver ultrasound done in house showed no acute changes. and all enzymes are either normalized or trending down based on repeat done today   Lab Results  Component Value Date   ALT 54 (H) 10/15/2021   AST 35 10/15/2021   ALKPHOS 80 10/15/2021   BILITOT 0.9 10/15/2021

## 2021-10-19 DIAGNOSIS — I10 Essential (primary) hypertension: Secondary | ICD-10-CM | POA: Diagnosis not present

## 2021-10-19 DIAGNOSIS — I341 Nonrheumatic mitral (valve) prolapse: Secondary | ICD-10-CM | POA: Diagnosis not present

## 2021-10-19 DIAGNOSIS — I5032 Chronic diastolic (congestive) heart failure: Secondary | ICD-10-CM | POA: Diagnosis not present

## 2021-10-19 DIAGNOSIS — E782 Mixed hyperlipidemia: Secondary | ICD-10-CM | POA: Diagnosis not present

## 2021-10-19 DIAGNOSIS — R0602 Shortness of breath: Secondary | ICD-10-CM | POA: Diagnosis not present

## 2021-10-19 DIAGNOSIS — I480 Paroxysmal atrial fibrillation: Secondary | ICD-10-CM | POA: Diagnosis not present

## 2021-10-22 ENCOUNTER — Telehealth: Payer: Self-pay | Admitting: Internal Medicine

## 2021-10-22 DIAGNOSIS — R791 Abnormal coagulation profile: Secondary | ICD-10-CM | POA: Diagnosis not present

## 2021-10-22 NOTE — Telephone Encounter (Signed)
Spoke with pt and she stated that she has been scheduled with Joycelyn Schmid, NP tomorrow at 8 am. Pt stated that she has found out what that her INR level was very off and causing her blood to be thin so pt has been advised to stop her coumadin for four days. Pt would like to still be seen for the wheezing.

## 2021-10-22 NOTE — Telephone Encounter (Signed)
Patient called in was prescribe bt Dr Derrel Nip cough meds, Has been taking meds for week but now spitting up blood , wheezing  xfer access nurse

## 2021-10-23 ENCOUNTER — Ambulatory Visit (INDEPENDENT_AMBULATORY_CARE_PROVIDER_SITE_OTHER): Payer: Medicare Other

## 2021-10-23 ENCOUNTER — Other Ambulatory Visit: Payer: Self-pay

## 2021-10-23 ENCOUNTER — Encounter: Payer: Self-pay | Admitting: Family

## 2021-10-23 ENCOUNTER — Ambulatory Visit (INDEPENDENT_AMBULATORY_CARE_PROVIDER_SITE_OTHER): Payer: Medicare Other | Admitting: Family

## 2021-10-23 VITALS — BP 150/78 | HR 49 | Temp 97.7°F | Ht 66.0 in | Wt 175.4 lb

## 2021-10-23 DIAGNOSIS — I517 Cardiomegaly: Secondary | ICD-10-CM | POA: Diagnosis not present

## 2021-10-23 DIAGNOSIS — I509 Heart failure, unspecified: Secondary | ICD-10-CM

## 2021-10-23 DIAGNOSIS — J811 Chronic pulmonary edema: Secondary | ICD-10-CM | POA: Diagnosis not present

## 2021-10-23 DIAGNOSIS — R051 Acute cough: Secondary | ICD-10-CM | POA: Diagnosis not present

## 2021-10-23 DIAGNOSIS — R059 Cough, unspecified: Secondary | ICD-10-CM | POA: Diagnosis not present

## 2021-10-23 LAB — BASIC METABOLIC PANEL
BUN: 15 mg/dL (ref 6–23)
CO2: 29 mEq/L (ref 19–32)
Calcium: 9.4 mg/dL (ref 8.4–10.5)
Chloride: 103 mEq/L (ref 96–112)
Creatinine, Ser: 0.86 mg/dL (ref 0.40–1.20)
GFR: 63.48 mL/min (ref >60.00)
Glucose, Bld: 88 mg/dL (ref 70–99)
Potassium: 3.6 mEq/L (ref 3.5–5.1)
Sodium: 140 mEq/L (ref 135–145)

## 2021-10-23 LAB — BRAIN NATRIURETIC PEPTIDE: Pro B Natriuretic peptide (BNP): 649 pg/mL — ABNORMAL HIGH (ref 0.0–100.0)

## 2021-10-23 NOTE — Progress Notes (Signed)
Subjective:    Patient ID: Carol [name], female    DOB: 06/30/1941, 80 y.o.   MRN: [id number]  CC: Carol [name] is a 80 y.o. female who presents today for an acute visit.    HPI: Complains of cough x 4 weeks, some improvement. SOB has resolved.  She notes less phlegm. Cough during day and night. Endorses nasal congestion. Cough is not worse with activity.  She continues to experience intermittent palpiations. No associated CP. Leg swelling has resolved. She is taking cheritussin with relief.  She has been coughing 'brown tinge' mucous and then yesterday coughed up bright red blood mixed in dark green mucous size of nickel. No other bleeding.  No fever, sinus pain, ear sore throat.   Currently holding warfarin for 4 days per Dr Nehemiah Massed.     Treated for cough 09/21/21 with augmentin with improvement however didn't complelely resolve. Negative covid two weeks ago.  Husband positive covid 10/16/21  History of proximal atrial fibrillation, chronic heart failure with preserved ejection fraction. She had  ablation April 2022. Hospitalized 10/07/21 for atrial fibrillation RVR underwent cardioversion 10/10/2019 discharged on metoprolol, diltiazem, amiodarone  She follow-up with cardiology 10/19/2021 which discussed shortness of breath on exertion, extremity swelling.  She is compliant with Lasix 40mg  daily.  10/09/21 Echocardiogram TEE with an EF of 35 to 40%  Amiodarone was decreased to 200 mg once daily due to bradycardia, metoprolol 100 mg continue and warfarin 3 and 6mg  continued.   Never smoked History of lymphoma  She was seen by primary care physician 10/15/2021 for cough, shortness of breath.  CXR showed cardiomegaly with pulmonary vascular congestion . increased Lasix to 40 mg every morning  History of acquired thrombophilia  INR 7.4 one day ago  BNP 1651 eight days ago HISTORY:  Past Medical History:  Diagnosis Date   A-fib ([place])    Chronic  sinusitis    Fibrocystic breast disease    History of pneumonia 1999   Hyperlipidemia    Irritable bowel syndrome    Lichen planus    lymphoma August 2013   Low grade B cell   Mild tricuspid insufficiency Jan 2012   ECHO, [place]   Moderate mitral insufficiency JAN 2012   ECHO, [place]   Past Surgical History:  Procedure Laterality Date   ABDOMINAL HYSTERECTOMY     precancerous cervix,     ABLATION OF DYSRHYTHMIC FOCUS  August 2013   [facility], Dr. Marcello Moores   BREAST SURGERY     bilateral, benign biopsies   CARDIOVERSION N/A 11/23/2020   Procedure: CARDIOVERSION;  Surgeon: Corey Skains, MD;  Location: [facility];  Service: Cardiovascular;  Laterality: N/A;   CARDIOVERSION N/A 01/17/2021   Procedure: CARDIOVERSION;  Surgeon: Corey Skains, MD;  Location: [facility];  Service: Cardiovascular;  Laterality: N/A;   CARDIOVERSION N/A 01/31/2021   Procedure: CARDIOVERSION;  Surgeon: Corey Skains, MD;  Location: [facility];  Service: Cardiovascular;  Laterality: N/A;   CARDIOVERSION N/A 10/09/2021   Procedure: CARDIOVERSION;  Surgeon: Corey Skains, MD;  Location: [facility];  Service: Cardiovascular;  Laterality: N/A;   MAXILLARY SINUS LIFT  1990's   [name]   oophorectomy     squamous cell removal Right    right leg calf area.   TEE WITHOUT CARDIOVERSION N/A 01/31/2021   Procedure: TRANSESOPHAGEAL ECHOCARDIOGRAM (TEE);  Surgeon: Corey Skains, MD;  Location: [facility];  Service: Cardiovascular;  Laterality: N/A;   TEE WITHOUT CARDIOVERSION N/A 10/09/2021  Procedure: TRANSESOPHAGEAL ECHOCARDIOGRAM (TEE);  Surgeon: Corey Skains, MD;  Location: [facility];  Service: Cardiovascular;  Laterality: N/A;   Family History  Problem Relation Age of Onset   Cancer Mother        Breast   Hyperlipidemia Father    Heart disease Father    Hypertension Father    Kidney disease Father    Cancer Maternal Grandfather        colon CA   Diverticulitis Sister     Allergies:  Prednisone, Pulmicort [budesonide], Clonidine, Lotemax [loteprednol etabonate], Morphine and related, Trovan [alatrofloxacin mesylate], Zelnorm [tegaserod maleate], and Erythromycin Current Outpatient Medications on File Prior to Visit  Medication Sig Dispense Refill   acetaminophen (TYLENOL) 500 MG tablet Take 500 mg by mouth every 6 (six) hours as needed for moderate pain or headache.     amiodarone (PACERONE) 200 MG tablet Take 200 mg by mouth 2 (two) times daily.     b complex vitamins capsule Take 1 capsule by mouth daily.     bisacodyl (DULCOLAX) 5 MG EC tablet Take 5 mg by mouth daily as needed for moderate constipation.     Cholecalciferol (VITAMIN D3) 125 MCG (5000 UT) TABS Take 5,000 Units by mouth daily.     diltiazem (CARDIZEM CD) 120 MG 24 hr capsule Take 2 capsules (240 mg total) by mouth daily. 30 capsule 0   diphenhydrAMINE (BENADRYL) 25 mg capsule Take 25 mg by mouth every 6 (six) hours as needed for allergies.     docusate sodium (COLACE) 100 MG capsule Take 100 mg by mouth daily as needed for mild constipation.     furosemide (LASIX) 40 MG tablet Take 1 tablet (40 mg total) by mouth daily. More if directed by physician 90 tablet 1   gabapentin (NEURONTIN) 100 MG capsule Take 2 capsules (200 mg total) by mouth 3 (three) times daily. (Patient taking differently: Take 100 mg by mouth daily. Patient taking 100 mg in the morning and 200 mg at night.) 540 capsule 1   Glucosamine-Chondroit-Vit C-Mn (GLUCOSAMINE 1500 COMPLEX PO) Take 1,500 mg by mouth daily.     Glycerin-Polysorbate 80 (REFRESH DRY EYE THERAPY OP) Place 1 drop into both eyes 2 (two) times daily.     guaiFENesin-codeine (CHERATUSSIN AC) 100-10 MG/5ML syrup Take 5 mLs by mouth 3 (three) times daily as needed for cough. 120 mL 0   hydrOXYzine (VISTARIL) 25 MG capsule Take 1 capsule (25 mg total) by mouth every 8 (eight) hours as needed. 30 capsule 0   magnesium oxide (MAG-OX) 400 MG tablet Take 400 mg by mouth in the morning  and at bedtime.     metoprolol succinate (TOPROL-XL) 50 MG 24 hr tablet Take 100 mg by mouth daily.     Multiple Vitamin (MULTIVITAMIN WITH MINERALS) TABS tablet Take 0.5 tablets by mouth 2 (two) times daily.     Omega-3 Fatty Acids (FISH OIL) 1000 MG CAPS Take 2,000 mg by mouth in the morning, at noon, and at bedtime.     omeprazole (PRILOSEC) 20 MG capsule Take 1 capsule (20 mg total) by mouth daily. 90 capsule 1   vitamin E 1000 UNIT capsule Take 1,000 Units by mouth 2 (two) times daily.     warfarin (COUMADIN) 3 MG tablet Take 5-6 mg by mouth See admin instructions. Alternate 5 mg and 6 mg daily     warfarin (COUMADIN) 6 MG tablet Take 6 mg by mouth daily.     [DISCONTINUED] levalbuterol (XOPENEX HFA) 45  MCG/ACT inhaler Inhale 1-2 puffs into the lungs every 4 (four) hours as needed for wheezing. 1 Inhaler 3   No current facility-administered medications on file prior to visit.    Social History   Tobacco Use   Smoking status: Never   Smokeless tobacco: Never  Substance Use Topics   Alcohol use: Yes    Alcohol/week: 4.0 standard drinks    Types: 4 Glasses of wine per week    Comment: Occ.   Drug use: No    Review of Systems  Constitutional:  Negative for chills and fever.  HENT:  Positive for congestion. Negative for nosebleeds, sinus pressure and sore throat.   Respiratory:  Positive for cough. Negative for shortness of breath (resolved).   Cardiovascular:  Negative for chest pain, palpitations and leg swelling (resolved).  Gastrointestinal:  Negative for nausea and vomiting.  Neurological:  Negative for headaches.     Objective:    BP (!) 150/78 (BP Location: Left Arm, Patient Position: Sitting, Cuff Size: Normal)    Pulse (!) 49    Temp 97.7 F (36.5 C) (Oral)    Ht 5\' 6"  (1.676 m)    Wt 175 lb 6.4 oz (79.6 kg)    SpO2 99%    BMI 28.31 kg/m   Wt Readings from Last 3 Encounters:  10/23/21 175 lb 6.4 oz (79.6 kg)  10/15/21 179 lb 9.6 oz (81.5 kg)  10/08/21 184 lb 1.4 oz  (83.5 kg)    Physical Exam Vitals reviewed.  Constitutional:      Appearance: She is well-developed.  HENT:     Head: Normocephalic and atraumatic.     Right Ear: Hearing, tympanic membrane, ear canal and external ear normal. No decreased hearing noted. No drainage, swelling or tenderness. No middle ear effusion. No foreign body. Tympanic membrane is not erythematous or bulging.     Left Ear: Hearing, tympanic membrane, ear canal and external ear normal. No decreased hearing noted. No drainage, swelling or tenderness.  No middle ear effusion. No foreign body. Tympanic membrane is not erythematous or bulging.     Nose: Nose normal. No rhinorrhea.     Right Sinus: No maxillary sinus tenderness or frontal sinus tenderness.     Left Sinus: No maxillary sinus tenderness or frontal sinus tenderness.     Mouth/Throat:     Pharynx: Uvula midline. No oropharyngeal exudate or posterior oropharyngeal erythema.     Tonsils: No tonsillar abscesses.  Eyes:     Conjunctiva/sclera: Conjunctivae normal.  Cardiovascular:     Rate and Rhythm: Regular rhythm.     Pulses: Normal pulses.     Heart sounds: Normal heart sounds.  Pulmonary:     Effort: Pulmonary effort is normal.     Breath sounds: Normal breath sounds. No wheezing, rhonchi or rales.  Musculoskeletal:     Right lower leg: No edema.     Left lower leg: No edema.  Lymphadenopathy:     Head:     Right side of head: No submental, submandibular, tonsillar, preauricular, posterior auricular or occipital adenopathy.     Left side of head: No submental, submandibular, tonsillar, preauricular, posterior auricular or occipital adenopathy.     Cervical: No cervical adenopathy.  Skin:    General: Skin is warm and dry.  Neurological:     Mental Status: She is alert.  Psychiatric:        Speech: Speech normal.        Behavior: Behavior normal.  Thought Content: Thought content normal.       Assessment & Plan:   Problem List Items  Addressed This Visit       Other   Acute cough - Primary    Afebrile.  Patient is in no acute respiratory distress.  Her weight is down 4 pounds in the last 8 days.  Shortness of breath and leg swelling have resolved .she has been compliant with her Lasix 40 mg increased from 20mg  QD.  No adventitious lung sounds.  She has had an ongoing cough approximately 4 weeks and was treated initially with Augmentin with some improvement however no resolution of symptoms.  Symptoms improved with escalation of lasix.  She will continue following with cardiology due to her supratherapeutic INR. She iscurrently she is holding warfarin for 4 days per Dr. Sharyne [name].  Advised Mucinex and to continue Cheratussin.  Pending BNP, BMP and chest x-ray .      Relevant Orders   Basic metabolic panel (Completed)   Brain natriuretic peptide (Completed)   DG Chest 2 View (Completed)   Other Visit Diagnoses     Congestive heart failure, unspecified HF chronicity, unspecified heart failure type ([place])       Relevant Orders   Brain natriuretic peptide (Completed)         I am having Rodina P. Marsala maintain her diphenhydrAMINE, magnesium oxide, vitamin E, Fish Oil, multivitamin with minerals, Vitamin D3, bisacodyl, Glycerin-Polysorbate 80 (REFRESH DRY EYE THERAPY OP), amiodarone, docusate sodium, Glucosamine-Chondroit-Vit C-Mn (GLUCOSAMINE 1500 COMPLEX PO), warfarin, b complex vitamins, acetaminophen, omeprazole, hydrOXYzine, gabapentin, diltiazem, metoprolol succinate, warfarin, Cheratussin AC, and furosemide.   No orders of the defined types were placed in this encounter.   Return precautions given.   Risks, benefits, and alternatives of the medications and treatment plan prescribed today were discussed, and patient expressed understanding.   Education regarding symptom management and diagnosis given to patient on AVS.  Continue to follow with Crecencio Mc, MD for routine health maintenance.   Carol [name] and I agreed with plan.   Mable Paris, FNP

## 2021-10-23 NOTE — Assessment & Plan Note (Signed)
Afebrile.  Patient is in no acute respiratory distress.  Her weight is down 4 pounds in the last 8 days.  Shortness of breath and leg swelling have resolved .she has been compliant with her Lasix 40 mg increased from 20mg  QD.  No adventitious lung sounds.  She has had an ongoing cough approximately 4 weeks and was treated initially with Augmentin with some improvement however no resolution of symptoms.  Symptoms improved with escalation of lasix.  She will continue following with cardiology due to her supratherapeutic INR. She iscurrently she is holding warfarin for 4 days per Dr. Sharyne Peach.  Advised Mucinex and to continue Cheratussin.  Pending BNP, BMP and chest x-ray .

## 2021-10-23 NOTE — Patient Instructions (Addendum)
Start mucinex (  plain version ) for congestion.  This will help loosen thick congestion.  Please make sure you are drinking plenty of water.  Continue Cheratussin cough syrup  After we get back chest x-ray and labs will make a decision as it relates to antibiotic therapy.

## 2021-10-24 ENCOUNTER — Ambulatory Visit: Payer: Medicare Other | Admitting: Adult Health

## 2021-10-24 ENCOUNTER — Other Ambulatory Visit: Payer: Self-pay | Admitting: Family

## 2021-10-24 DIAGNOSIS — R051 Acute cough: Secondary | ICD-10-CM

## 2021-10-24 MED ORDER — ALBUTEROL SULFATE HFA 108 (90 BASE) MCG/ACT IN AERS: 2.0000 | INHALATION_SPRAY | Freq: Four times a day (QID) | RESPIRATORY_TRACT | 0 refills | Status: DC | PRN

## 2021-10-30 DIAGNOSIS — R791 Abnormal coagulation profile: Secondary | ICD-10-CM | POA: Diagnosis not present

## 2021-11-01 DIAGNOSIS — I5032 Chronic diastolic (congestive) heart failure: Secondary | ICD-10-CM | POA: Diagnosis not present

## 2021-11-01 DIAGNOSIS — R0602 Shortness of breath: Secondary | ICD-10-CM | POA: Diagnosis not present

## 2021-11-01 DIAGNOSIS — I480 Paroxysmal atrial fibrillation: Secondary | ICD-10-CM | POA: Diagnosis not present

## 2021-11-04 ENCOUNTER — Other Ambulatory Visit: Payer: Self-pay | Admitting: Internal Medicine

## 2021-11-06 DIAGNOSIS — R791 Abnormal coagulation profile: Secondary | ICD-10-CM | POA: Diagnosis not present

## 2021-11-16 DIAGNOSIS — I341 Nonrheumatic mitral (valve) prolapse: Secondary | ICD-10-CM | POA: Diagnosis not present

## 2021-11-16 DIAGNOSIS — I480 Paroxysmal atrial fibrillation: Secondary | ICD-10-CM | POA: Diagnosis not present

## 2021-11-16 DIAGNOSIS — J410 Simple chronic bronchitis: Secondary | ICD-10-CM | POA: Diagnosis not present

## 2021-11-16 DIAGNOSIS — Z23 Encounter for immunization: Secondary | ICD-10-CM | POA: Diagnosis not present

## 2021-11-16 DIAGNOSIS — I493 Ventricular premature depolarization: Secondary | ICD-10-CM | POA: Diagnosis not present

## 2021-11-16 DIAGNOSIS — I10 Essential (primary) hypertension: Secondary | ICD-10-CM | POA: Diagnosis not present

## 2021-11-16 DIAGNOSIS — I5032 Chronic diastolic (congestive) heart failure: Secondary | ICD-10-CM | POA: Diagnosis not present

## 2021-11-16 DIAGNOSIS — Z7901 Long term (current) use of anticoagulants: Secondary | ICD-10-CM | POA: Diagnosis not present

## 2021-11-21 DIAGNOSIS — R791 Abnormal coagulation profile: Secondary | ICD-10-CM | POA: Diagnosis not present

## 2021-11-28 DIAGNOSIS — R791 Abnormal coagulation profile: Secondary | ICD-10-CM | POA: Diagnosis not present

## 2021-12-03 DIAGNOSIS — I480 Paroxysmal atrial fibrillation: Secondary | ICD-10-CM | POA: Diagnosis not present

## 2021-12-05 DIAGNOSIS — R791 Abnormal coagulation profile: Secondary | ICD-10-CM | POA: Diagnosis not present

## 2021-12-13 DIAGNOSIS — I5032 Chronic diastolic (congestive) heart failure: Secondary | ICD-10-CM | POA: Diagnosis not present

## 2021-12-13 DIAGNOSIS — I10 Essential (primary) hypertension: Secondary | ICD-10-CM | POA: Diagnosis not present

## 2021-12-13 DIAGNOSIS — E782 Mixed hyperlipidemia: Secondary | ICD-10-CM | POA: Diagnosis not present

## 2021-12-13 DIAGNOSIS — I341 Nonrheumatic mitral (valve) prolapse: Secondary | ICD-10-CM | POA: Diagnosis not present

## 2021-12-13 DIAGNOSIS — R791 Abnormal coagulation profile: Secondary | ICD-10-CM | POA: Diagnosis not present

## 2021-12-13 DIAGNOSIS — I480 Paroxysmal atrial fibrillation: Secondary | ICD-10-CM | POA: Diagnosis not present

## 2021-12-27 DIAGNOSIS — Z7901 Long term (current) use of anticoagulants: Secondary | ICD-10-CM | POA: Diagnosis not present

## 2022-01-10 DIAGNOSIS — I10 Essential (primary) hypertension: Secondary | ICD-10-CM | POA: Diagnosis not present

## 2022-01-10 DIAGNOSIS — I480 Paroxysmal atrial fibrillation: Secondary | ICD-10-CM | POA: Diagnosis not present

## 2022-01-10 DIAGNOSIS — Z7901 Long term (current) use of anticoagulants: Secondary | ICD-10-CM | POA: Diagnosis not present

## 2022-01-10 DIAGNOSIS — Z01818 Encounter for other preprocedural examination: Secondary | ICD-10-CM | POA: Diagnosis not present

## 2022-01-10 IMAGING — DX DG CHEST 2V
2 series · 2 of 2 positions shown · non-contrast
Comparison: 10/15/2021.

CLINICAL DATA: Cough, follow-up pulmonary vascular congestion

EXAM:
CHEST - 2 VIEW

[chest pa]
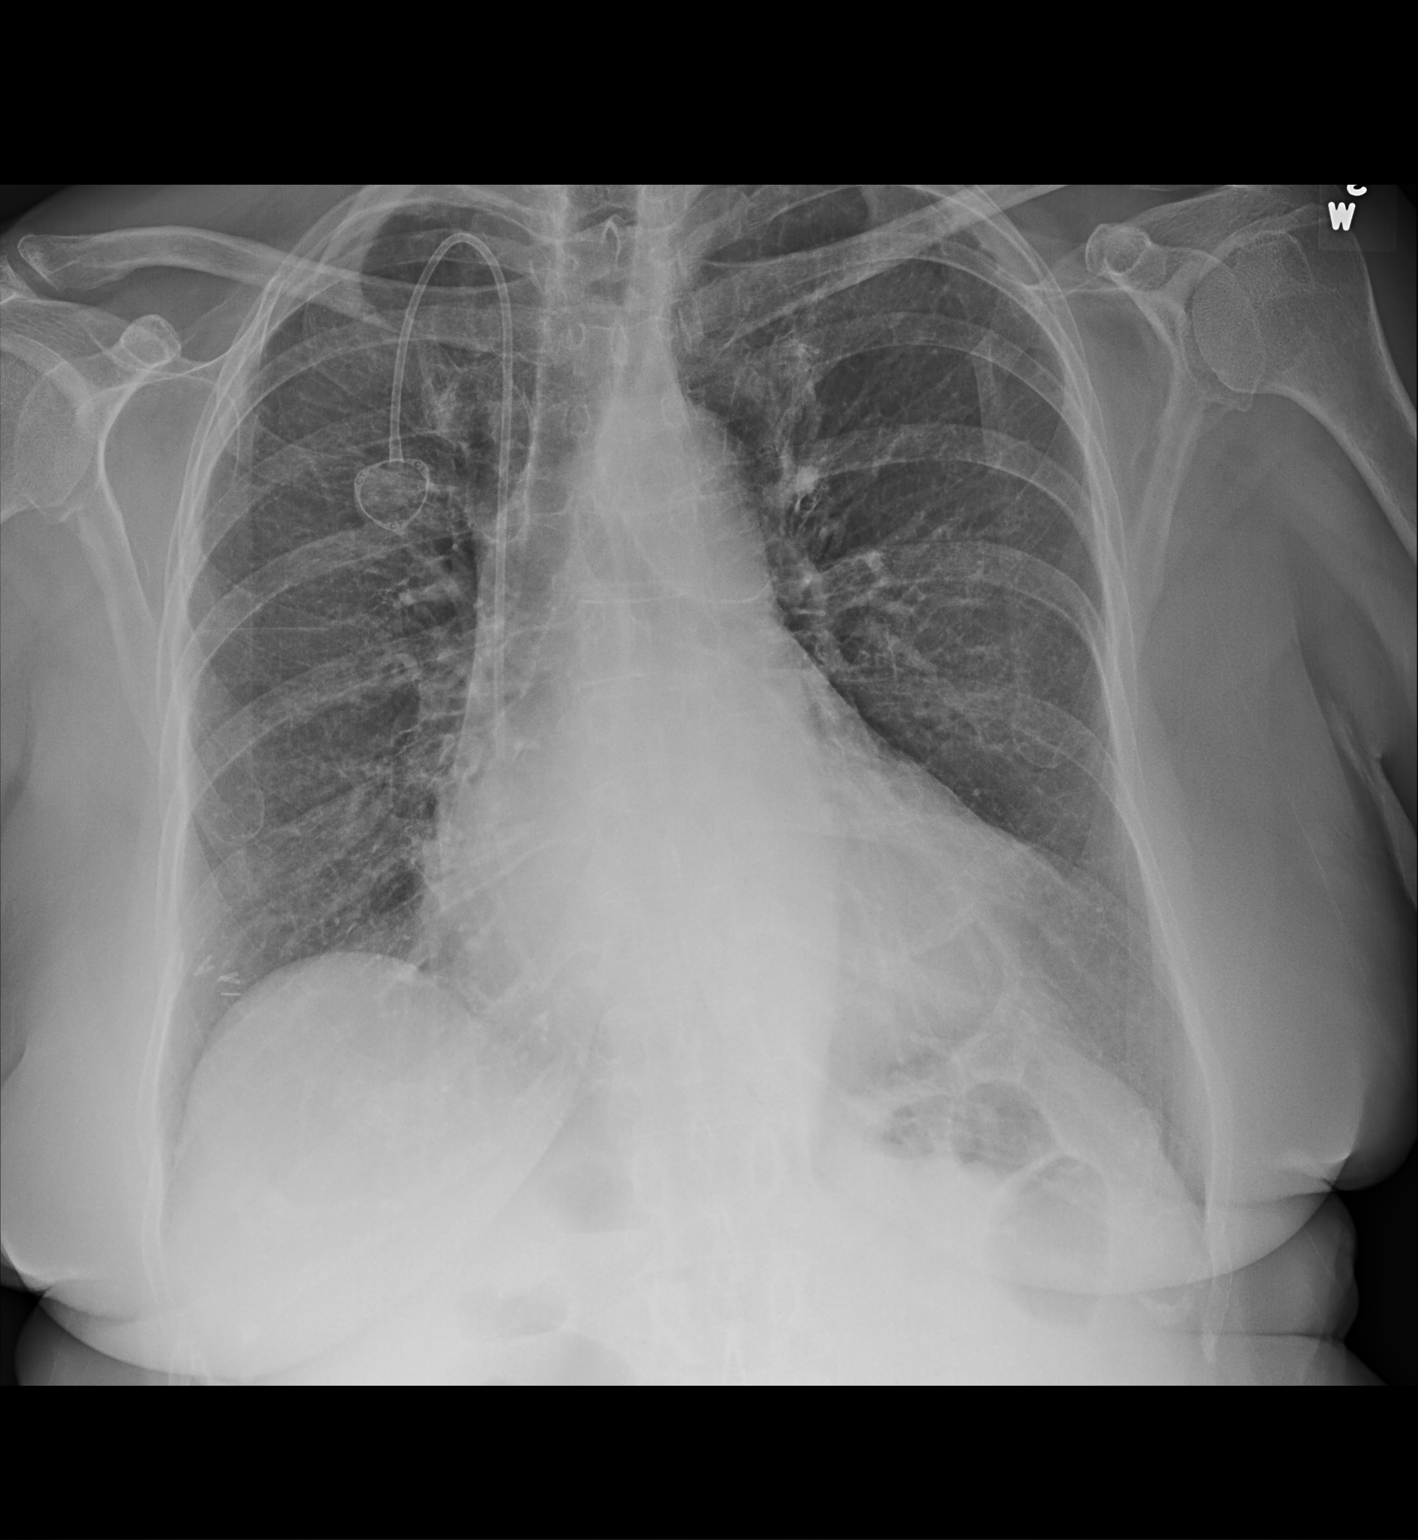

[chest lat]
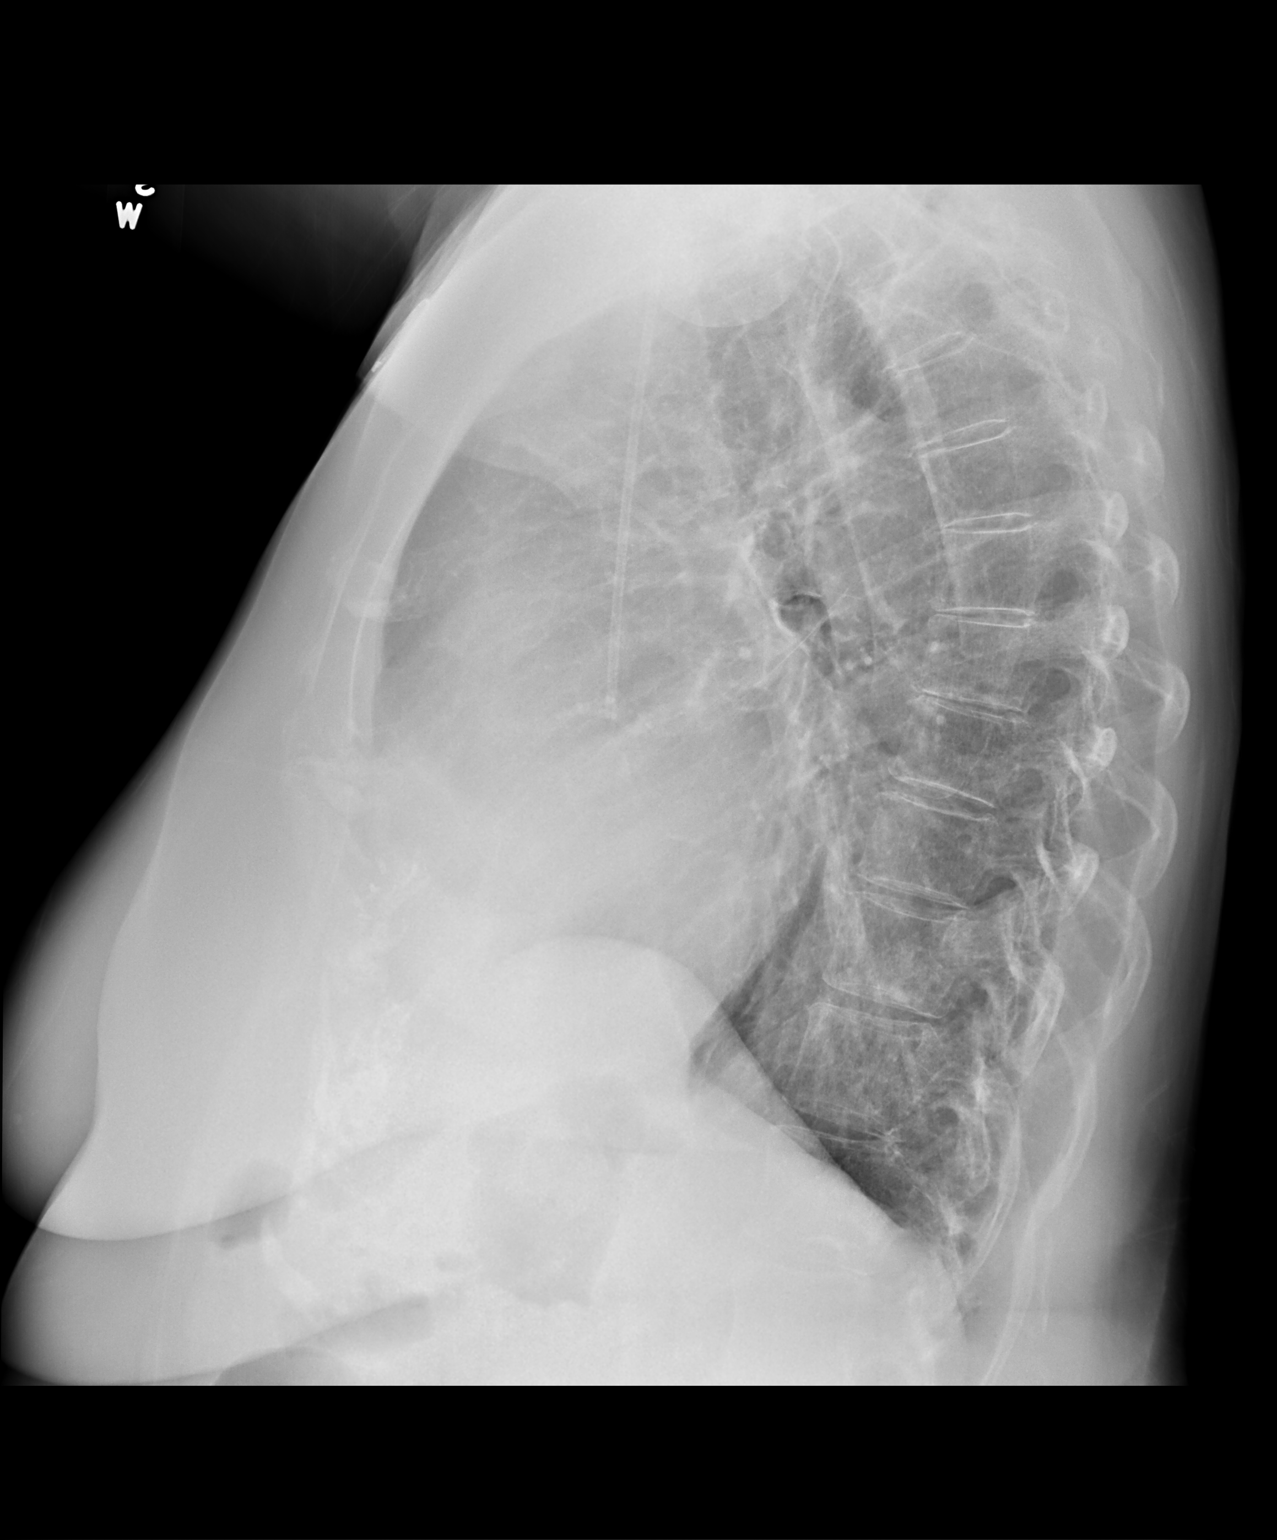

[2 of 2 positions shown; findings below may reference images not displayed]

FINDINGS: Similar enlargement of the cardiac silhouette. Similar position of a
right IJ Port-A-Cath with the tip projecting at the superior
cavoatrial junction. No consolidation. No visible pleural effusions
or pneumothorax.
IMPRESSION: 1. No evidence of acute cardiopulmonary disease.
2. Similar cardiomegaly.

## 2022-01-16 MED ORDER — SODIUM CHLORIDE 0.9 % IV SOLN: INTRAVENOUS | Status: DC

## 2022-01-17 ENCOUNTER — Ambulatory Visit: Payer: Medicare Other | Admitting: Certified Registered Nurse Anesthetist

## 2022-01-17 ENCOUNTER — Ambulatory Visit
Admission: RE | Admit: 2022-01-17 | Discharge: 2022-01-17 | Disposition: A | Discharge status: Home / Self Care | Payer: Medicare Other | Attending: Internal Medicine | Admitting: Internal Medicine

## 2022-01-17 ENCOUNTER — Encounter: Payer: Self-pay | Admitting: Internal Medicine

## 2022-01-17 ENCOUNTER — Other Ambulatory Visit: Payer: Self-pay

## 2022-01-17 ENCOUNTER — Encounter
Admission: RE | Disposition: A | Discharge status: Home / Self Care | Payer: Self-pay | Source: Home / Self Care | Attending: Internal Medicine

## 2022-01-17 DIAGNOSIS — I480 Paroxysmal atrial fibrillation: Secondary | ICD-10-CM | POA: Insufficient documentation

## 2022-01-17 DIAGNOSIS — I509 Heart failure, unspecified: Secondary | ICD-10-CM | POA: Diagnosis not present

## 2022-01-17 DIAGNOSIS — E785 Hyperlipidemia, unspecified: Secondary | ICD-10-CM | POA: Diagnosis not present

## 2022-01-17 DIAGNOSIS — Z8572 Personal history of non-Hodgkin lymphomas: Secondary | ICD-10-CM | POA: Diagnosis not present

## 2022-01-17 DIAGNOSIS — I4891 Unspecified atrial fibrillation: Secondary | ICD-10-CM | POA: Diagnosis not present

## 2022-01-17 DIAGNOSIS — I4811 Longstanding persistent atrial fibrillation: Secondary | ICD-10-CM

## 2022-01-17 DIAGNOSIS — Z7901 Long term (current) use of anticoagulants: Secondary | ICD-10-CM | POA: Diagnosis not present

## 2022-01-17 HISTORY — PX: CARDIOVERSION: SHX1299

## 2022-01-17 SURGERY — CARDIOVERSION
Anesthesia: General

## 2022-01-17 MED ORDER — PROPOFOL 10 MG/ML IV BOLUS
INTRAVENOUS | Status: DC | PRN
  Administered 2022-01-17: 50 mg via INTRAVENOUS

## 2022-01-17 NOTE — Transfer of Care (Signed)
Immediate Anesthesia Transfer of Care Note ? ?Patient: Carol [name] ? ?Procedure(s) Performed: CARDIOVERSION ? ?Patient Location: PACU ? ?Anesthesia Type:General ? ?Level of Consciousness: awake, alert  and oriented ? ?Airway & Oxygen Therapy: Patient Spontanous Breathing ? ?Post-op Assessment: Report given to RN and Post -op Vital signs reviewed and stable ? ?Post vital signs: Reviewed and stable ? ?Last Vitals:  ?Vitals Value Taken Time  ?BP 136/92 01/17/22 0803  ?Temp    ?Pulse 52 01/17/22 0805  ?Resp 22 01/17/22 0805  ?SpO2 94 % 01/17/22 0805  ?Vitals shown include unvalidated device data. ? ?Last Pain:  ?Vitals:  ? 01/17/22 0734  ?TempSrc: Oral  ?PainSc: 0-No pain  ?   ? ?  ? ?Complications: No notable events documented. ?

## 2022-01-17 NOTE — Anesthesia Preprocedure Evaluation (Signed)
Anesthesia Evaluation  ?Patient identified by MRN, date of birth, ID band ?Patient awake ? ? ? ?Reviewed: ?Allergy & Precautions, H&P , NPO status , Patient's Chart, lab work & pertinent test results, reviewed documented beta blocker date and time  ? ?Airway ?Mallampati: II ? ? ?Neck ROM: full ? ? ? Dental ? ?(+) Poor Dentition ?  ?Pulmonary ?neg pulmonary ROS,  ?  ?Pulmonary exam normal ? ? ? ? ? ? ? Cardiovascular ?+CHF  ?Normal cardiovascular exam ?Rhythm:regular Rate:Normal ? ? ?  ?Neuro/Psych ? Neuromuscular disease negative psych ROS  ? GI/Hepatic ?negative GI ROS, Neg liver ROS,   ?Endo/Other  ?negative endocrine ROS ? Renal/GU ?negative Renal ROS  ?negative genitourinary ?  ?Musculoskeletal ? ? Abdominal ?  ?Peds ? Hematology ?negative hematology ROS ?(+)   ?Anesthesia Other Findings ?Past Medical History: ?No date: A-fib [facility]) ?No date: Chronic sinusitis ?No date: Fibrocystic breast disease ?1999: History of pneumonia ?No date: Hyperlipidemia ?No date: Irritable bowel syndrome ?No date: Lichen planus ?August 2013: lymphoma ?    Comment:  Low grade B cell ?Jan 2012: Mild tricuspid insufficiency ?    Comment:  ECHO, [facility] ?JAN 2012: Moderate mitral insufficiency ?    Comment:  ECHO, [place] ?Past Surgical History: ?No date: ABDOMINAL HYSTERECTOMY ?    Comment:  precancerous cervix,   ?August 2013: ABLATION OF DYSRHYTHMIC FOCUS ?    Comment:  [facility], Dr. Marcello Moores ?No date: BREAST SURGERY ?    Comment:  bilateral, benign biopsies ?11/23/2020: CARDIOVERSION; N/A ?    Comment:  Procedure: CARDIOVERSION;  Surgeon: Corey Skains,  ?             MD;  Location: [facility];  Service: Cardiovascular;   ?             Laterality: N/A; ?01/17/2021: CARDIOVERSION; N/A ?    Comment:  Procedure: CARDIOVERSION;  Surgeon: Corey Skains,  ?             MD;  Location: [facility];  Service: Cardiovascular;   ?             Laterality: N/A; ?01/31/2021: CARDIOVERSION; N/A ?    Comment:  Procedure:  CARDIOVERSION;  Surgeon: Corey Skains,  ?             MD;  Location: [facility];  Service: Cardiovascular;   ?             Laterality: N/A; ?10/09/2021: CARDIOVERSION; N/A ?    Comment:  Procedure: CARDIOVERSION;  Surgeon: Corey Skains,  ?             MD;  Location: [facility];  Service: Cardiovascular;   ?             Laterality: N/A; ?1990's: MAXILLARY SINUS LIFT ?    Comment:  [name] ?No date: oophorectomy ?No date: squamous cell removal; Right ?    Comment:  right leg calf area. ?01/31/2021: TEE WITHOUT CARDIOVERSION; N/A ?    Comment:  Procedure: TRANSESOPHAGEAL ECHOCARDIOGRAM (TEE);   ?             Surgeon: Corey Skains, MD;  Location: [facility];   ?             Service: Cardiovascular;  Laterality: N/A; ?10/09/2021: TEE WITHOUT CARDIOVERSION; N/A ?    Comment:  Procedure: TRANSESOPHAGEAL ECHOCARDIOGRAM (TEE);   ?             Surgeon: Nehemiah Massed,  Everlean Cherry, MD;  Location: [facility];   ?             Service: Cardiovascular;  Laterality: N/A; ?BMI   ? Body Mass Index: 27.82 kg/m?  ?  ? Reproductive/Obstetrics ?negative OB ROS ? ?  ? ? ? ? ? ? ? ? ? ? ? ? ? ?  ?  ? ? ? ? ? ? ? ? ?Anesthesia Physical ?Anesthesia Plan ? ?ASA: 4 ? ?Anesthesia Plan: General  ? ?Post-op Pain Management:   ? ?Induction:  ? ?PONV Risk Score and Plan:  ? ?Airway Management Planned:  ? ?Additional Equipment:  ? ?Intra-op Plan:  ? ?Post-operative Plan:  ? ?Informed Consent: I have reviewed the patients History and Physical, chart, labs and discussed the procedure including the risks, benefits and alternatives for the proposed anesthesia with the patient or authorized representative who has indicated his/her understanding and acceptance.  ? ? ? ?Dental Advisory Given ? ?Plan Discussed with: CRNA ? ?Anesthesia Plan Comments:   ? ? ? ? ? ? ?Anesthesia Quick Evaluation ? ?

## 2022-01-17 NOTE — CV Procedure (Signed)
Electrical Cardioversion Procedure Note ?[name] ?[id number] ?13-Oct-1941 ? ?Procedure: Electrical Cardioversion ?Indications:  Paroxysmal non valvular atrial fibrillation ? ?Procedure Details ?Consent: Risks of procedure as well as the alternatives and risks of each were explained to the (patient/caregiver).  Consent for procedure obtained. ?Time Out: Verified patient identification, verified procedure, site/side was marked, verified correct patient position, special equipment/implants available, medications/allergies/relevent history reviewed, required imaging and test results available.  Performed ? ?Patient placed on cardiac monitor, pulse oximetry, supplemental oxygen as necessary.  ?Sedation given: Propofol and versed as per anesthesia  ?Pacer pads placed anterior and posterior chest. ? ?Cardioverted 2 time(s).  ?Cardioverted at 150J. ? ?Evaluation ?Findings: Post procedure EKG shows: NSR ?Complications: None ?Patient did tolerate procedure well. ? ? ?[name] M.D. FACC ?01/17/2022, 8:09 AM ? ? ? ?

## 2022-01-17 NOTE — Anesthesia Postprocedure Evaluation (Signed)
Anesthesia Post Note ? ?Patient: Carol [name] ? ?Procedure(s) Performed: CARDIOVERSION ? ?Patient location during evaluation: PACU ?Anesthesia Type: General ?Level of consciousness: awake and alert ?Pain management: pain level controlled ?Vital Signs Assessment: post-procedure vital signs reviewed and stable ?Respiratory status: spontaneous breathing, nonlabored ventilation, respiratory function stable and patient connected to nasal cannula oxygen ?Cardiovascular status: blood pressure returned to baseline and stable ?Postop Assessment: no apparent nausea or vomiting ?Anesthetic complications: no ? ? ?No notable events documented. ? ? ?Last Vitals:  ?Vitals:  ? 01/17/22 0830 01/17/22 0845  ?BP: 117/75 118/83  ?Pulse: (!) 57 66  ?Resp: 16 15  ?Temp:    ?SpO2: 96% 97%  ?  ?Last Pain:  ?Vitals:  ? 01/17/22 0845  ?TempSrc:   ?PainSc: 0-No pain  ? ? ?  ?  ?  ?  ?  ?  ? ?Molli Barrows ? ? ? ? ?

## 2022-02-01 DIAGNOSIS — I10 Essential (primary) hypertension: Secondary | ICD-10-CM | POA: Diagnosis not present

## 2022-02-01 DIAGNOSIS — I493 Ventricular premature depolarization: Secondary | ICD-10-CM | POA: Diagnosis not present

## 2022-02-01 DIAGNOSIS — I4891 Unspecified atrial fibrillation: Secondary | ICD-10-CM | POA: Diagnosis not present

## 2022-02-01 DIAGNOSIS — I480 Paroxysmal atrial fibrillation: Secondary | ICD-10-CM | POA: Diagnosis not present

## 2022-02-01 DIAGNOSIS — I5032 Chronic diastolic (congestive) heart failure: Secondary | ICD-10-CM | POA: Diagnosis not present

## 2022-02-01 DIAGNOSIS — E782 Mixed hyperlipidemia: Secondary | ICD-10-CM | POA: Diagnosis not present

## 2022-02-01 DIAGNOSIS — I341 Nonrheumatic mitral (valve) prolapse: Secondary | ICD-10-CM | POA: Diagnosis not present

## 2022-02-06 DIAGNOSIS — Z7901 Long term (current) use of anticoagulants: Secondary | ICD-10-CM | POA: Diagnosis not present

## 2022-02-21 DIAGNOSIS — D0359 Melanoma in situ of other part of trunk: Secondary | ICD-10-CM | POA: Diagnosis not present

## 2022-02-21 DIAGNOSIS — Z85828 Personal history of other malignant neoplasm of skin: Secondary | ICD-10-CM | POA: Diagnosis not present

## 2022-02-21 DIAGNOSIS — D2271 Melanocytic nevi of right lower limb, including hip: Secondary | ICD-10-CM | POA: Diagnosis not present

## 2022-02-21 DIAGNOSIS — D2261 Melanocytic nevi of right upper limb, including shoulder: Secondary | ICD-10-CM | POA: Diagnosis not present

## 2022-02-21 DIAGNOSIS — C44519 Basal cell carcinoma of skin of other part of trunk: Secondary | ICD-10-CM | POA: Diagnosis not present

## 2022-02-21 DIAGNOSIS — D485 Neoplasm of uncertain behavior of skin: Secondary | ICD-10-CM | POA: Diagnosis not present

## 2022-02-21 DIAGNOSIS — L570 Actinic keratosis: Secondary | ICD-10-CM | POA: Diagnosis not present

## 2022-02-21 DIAGNOSIS — D2262 Melanocytic nevi of left upper limb, including shoulder: Secondary | ICD-10-CM | POA: Diagnosis not present

## 2022-02-21 DIAGNOSIS — X32XXXA Exposure to sunlight, initial encounter: Secondary | ICD-10-CM | POA: Diagnosis not present

## 2022-02-28 DIAGNOSIS — D0359 Melanoma in situ of other part of trunk: Secondary | ICD-10-CM | POA: Diagnosis not present

## 2022-03-04 ENCOUNTER — Other Ambulatory Visit: Payer: Self-pay

## 2022-03-04 ENCOUNTER — Emergency Department: Payer: Medicare Other

## 2022-03-04 ENCOUNTER — Ambulatory Visit (INDEPENDENT_AMBULATORY_CARE_PROVIDER_SITE_OTHER): Payer: Medicare Other | Admitting: Family

## 2022-03-04 ENCOUNTER — Telehealth: Payer: Medicare Other | Admitting: Family Medicine

## 2022-03-04 ENCOUNTER — Encounter: Payer: Self-pay | Admitting: Family

## 2022-03-04 ENCOUNTER — Inpatient Hospital Stay
Admission: EM | Admit: 2022-03-04 | Discharge: 2022-03-12 | DRG: 862 | Disposition: A | Discharge status: Home / Self Care | Payer: Medicare Other | Source: Ambulatory Visit | Attending: Internal Medicine | Admitting: Internal Medicine

## 2022-03-04 DIAGNOSIS — E872 Acidosis, unspecified: Secondary | ICD-10-CM | POA: Diagnosis present

## 2022-03-04 DIAGNOSIS — Z881 Allergy status to other antibiotic agents status: Secondary | ICD-10-CM

## 2022-03-04 DIAGNOSIS — Z8249 Family history of ischemic heart disease and other diseases of the circulatory system: Secondary | ICD-10-CM

## 2022-03-04 DIAGNOSIS — I420 Dilated cardiomyopathy: Secondary | ICD-10-CM

## 2022-03-04 DIAGNOSIS — E785 Hyperlipidemia, unspecified: Secondary | ICD-10-CM | POA: Diagnosis present

## 2022-03-04 DIAGNOSIS — E871 Hypo-osmolality and hyponatremia: Secondary | ICD-10-CM | POA: Diagnosis present

## 2022-03-04 DIAGNOSIS — I700 Atherosclerosis of aorta: Secondary | ICD-10-CM | POA: Diagnosis not present

## 2022-03-04 DIAGNOSIS — A419 Sepsis, unspecified organism: Secondary | ICD-10-CM | POA: Insufficient documentation

## 2022-03-04 DIAGNOSIS — U071 COVID-19: Secondary | ICD-10-CM | POA: Diagnosis present

## 2022-03-04 DIAGNOSIS — N61 Mastitis without abscess: Secondary | ICD-10-CM | POA: Diagnosis present

## 2022-03-04 DIAGNOSIS — A4101 Sepsis due to Methicillin susceptible Staphylococcus aureus: Secondary | ICD-10-CM | POA: Diagnosis present

## 2022-03-04 DIAGNOSIS — Y838 Other surgical procedures as the cause of abnormal reaction of the patient, or of later complication, without mention of misadventure at the time of the procedure: Secondary | ICD-10-CM | POA: Diagnosis present

## 2022-03-04 DIAGNOSIS — Z885 Allergy status to narcotic agent status: Secondary | ICD-10-CM | POA: Diagnosis not present

## 2022-03-04 DIAGNOSIS — Z8572 Personal history of non-Hodgkin lymphomas: Secondary | ICD-10-CM | POA: Diagnosis not present

## 2022-03-04 DIAGNOSIS — Z841 Family history of disorders of kidney and ureter: Secondary | ICD-10-CM

## 2022-03-04 DIAGNOSIS — I5032 Chronic diastolic (congestive) heart failure: Secondary | ICD-10-CM | POA: Diagnosis present

## 2022-03-04 DIAGNOSIS — Z800 Family history of malignant neoplasm of digestive organs: Secondary | ICD-10-CM

## 2022-03-04 DIAGNOSIS — L03313 Cellulitis of chest wall: Secondary | ICD-10-CM | POA: Diagnosis not present

## 2022-03-04 DIAGNOSIS — I5030 Unspecified diastolic (congestive) heart failure: Secondary | ICD-10-CM

## 2022-03-04 DIAGNOSIS — C4352 Malignant melanoma of skin of breast: Secondary | ICD-10-CM | POA: Diagnosis present

## 2022-03-04 DIAGNOSIS — L0390 Cellulitis, unspecified: Secondary | ICD-10-CM | POA: Diagnosis not present

## 2022-03-04 DIAGNOSIS — R0602 Shortness of breath: Secondary | ICD-10-CM | POA: Diagnosis not present

## 2022-03-04 DIAGNOSIS — T8141XA Infection following a procedure, superficial incisional surgical site, initial encounter: Principal | ICD-10-CM | POA: Diagnosis present

## 2022-03-04 DIAGNOSIS — Z888 Allergy status to other drugs, medicaments and biological substances status: Secondary | ICD-10-CM | POA: Diagnosis not present

## 2022-03-04 DIAGNOSIS — I4891 Unspecified atrial fibrillation: Secondary | ICD-10-CM | POA: Diagnosis present

## 2022-03-04 DIAGNOSIS — R079 Chest pain, unspecified: Secondary | ICD-10-CM | POA: Diagnosis not present

## 2022-03-04 DIAGNOSIS — I480 Paroxysmal atrial fibrillation: Secondary | ICD-10-CM | POA: Diagnosis present

## 2022-03-04 DIAGNOSIS — R6520 Severe sepsis without septic shock: Secondary | ICD-10-CM | POA: Diagnosis not present

## 2022-03-04 DIAGNOSIS — T8144XA Sepsis following a procedure, initial encounter: Secondary | ICD-10-CM | POA: Diagnosis present

## 2022-03-04 DIAGNOSIS — N179 Acute kidney failure, unspecified: Secondary | ICD-10-CM | POA: Diagnosis present

## 2022-03-04 DIAGNOSIS — Z48817 Encounter for surgical aftercare following surgery on the skin and subcutaneous tissue: Secondary | ICD-10-CM | POA: Diagnosis not present

## 2022-03-04 DIAGNOSIS — T8149XA Infection following a procedure, other surgical site, initial encounter: Secondary | ICD-10-CM

## 2022-03-04 DIAGNOSIS — Z7901 Long term (current) use of anticoagulants: Secondary | ICD-10-CM

## 2022-03-04 HISTORY — DX: COVID-19: U07.1

## 2022-03-04 LAB — CBC
HCT: 38.4 % (ref 36.0–46.0)
Hemoglobin: 12.7 g/dL (ref 12.0–15.0)
MCH: 31.9 pg (ref 26.0–34.0)
MCHC: 33.1 g/dL (ref 30.0–36.0)
MCV: 96.5 fL (ref 80.0–100.0)
Platelets: 208 10*3/uL (ref 150–400)
RBC: 3.98 MIL/uL (ref 3.87–5.11)
RDW: 14.3 % (ref 11.5–15.5)
WBC: 20 10*3/uL — ABNORMAL HIGH (ref 4.0–10.5)
nRBC: 0 % (ref 0.0–0.2)

## 2022-03-04 LAB — LACTIC ACID, PLASMA
Lactic Acid, Venous: 2.6 mmol/L (ref 0.5–1.9)
Lactic Acid, Venous: 1.5 mmol/L (ref 0.5–1.9)

## 2022-03-04 LAB — BASIC METABOLIC PANEL
Anion gap: 17 — ABNORMAL HIGH (ref 5–15)
BUN: 37 mg/dL — ABNORMAL HIGH (ref 8–23)
CO2: 17 mmol/L — ABNORMAL LOW (ref 22–32)
Calcium: 8.2 mg/dL — ABNORMAL LOW (ref 8.9–10.3)
Chloride: 87 mmol/L — ABNORMAL LOW (ref 98–111)
Creatinine, Ser: 2.2 mg/dL — ABNORMAL HIGH (ref 0.44–1.00)
GFR, Estimated: 22 mL/min — ABNORMAL LOW (ref >60)
Glucose, Bld: 109 mg/dL — ABNORMAL HIGH (ref 70–99)
Potassium: 4.7 mmol/L (ref 3.5–5.1)
Sodium: 121 mmol/L — ABNORMAL LOW (ref 135–145)

## 2022-03-04 LAB — PROTIME-INR
INR: 2.8 — ABNORMAL HIGH (ref 0.8–1.2)
Prothrombin Time: 29 seconds — ABNORMAL HIGH (ref 11.4–15.2)

## 2022-03-04 MED ORDER — LACTATED RINGERS IV SOLN: INTRAVENOUS | Status: AC

## 2022-03-04 MED ORDER — MOLNUPIRAVIR EUA 200MG CAPSULE: 4.0000 | ORAL_CAPSULE | Freq: Two times a day (BID) | ORAL | 0 refills | Status: DC

## 2022-03-04 MED ORDER — VANCOMYCIN HCL IN DEXTROSE 1-5 GM/200ML-% IV SOLN
1000.0000 mg | Freq: Once | INTRAVENOUS | Status: DC
  Filled 2022-03-04: qty 200

## 2022-03-04 MED ORDER — LACTATED RINGERS IV BOLUS
500.0000 mL | Freq: Once | INTRAVENOUS | Status: AC
  Administered 2022-03-04: 500 mL via INTRAVENOUS

## 2022-03-04 MED ORDER — VANCOMYCIN HCL 1750 MG/350ML IV SOLN
1750.0000 mg | Freq: Once | INTRAVENOUS | Status: AC
  Administered 2022-03-04: 1750 mg via INTRAVENOUS
  Filled 2022-03-04: qty 350

## 2022-03-04 MED ORDER — FENTANYL CITRATE PF 50 MCG/ML IJ SOSY
25.0000 ug | PREFILLED_SYRINGE | Freq: Once | INTRAMUSCULAR | Status: AC
Start: 2022-03-04 — End: 2022-03-04
  Administered 2022-03-04: 25 ug via INTRAVENOUS
  Filled 2022-03-04: qty 1

## 2022-03-04 MED ORDER — LACTATED RINGERS IV BOLUS: 1000.0000 mL | Freq: Once | INTRAVENOUS | Status: DC

## 2022-03-04 MED ORDER — SODIUM CHLORIDE 0.9 % IV SOLN
2.0000 g | Freq: Once | INTRAVENOUS | Status: AC
  Administered 2022-03-04: 2 g via INTRAVENOUS
  Filled 2022-03-04: qty 20

## 2022-03-04 NOTE — Progress Notes (Signed)
PHARMACY -  BRIEF ANTIBIOTIC NOTE  ? ?Pharmacy has received consult(s) for Vancomycin from an ED provider.  The patient's profile has been reviewed for ht/wt/allergies/indication/available labs.   ? ?One time order(s) placed for Vanc 1750 mg per pt wt 79.4 kg. ? ?Further antibiotics/pharmacy consults should be ordered by admitting physician if indicated.       ?                ?Thank you, ?Renda Rolls, PharmD, MBA ?03/04/2022 ?10:47 PM ? ? ?

## 2022-03-04 NOTE — ED Provider Notes (Signed)
? ?[facility] ?Provider Note ? ? ? Event Date/Time  ? First MD Initiated Contact with Patient 03/04/22 2213   ?  (approximate) ? ? ?History  ? ?Breast Problem ? ? ?HPI ? ?Carol [name] is a 81 y.o. female   who on review of nurse practitioner note by family medicine clinic from today has coronavirus ? ?Patient also reports recent right breast excision for melanoma.  She had surgery on Thursday and noticed redness and some pain and swelling Friday saw her doctor Monday and started doxycycline but reports she had increasing pain swelling redness and fatigue.  She has had pain over her right chest.  She is body aches but no shortness of breath and was diagnosed with coronavirus recently as well currently on day 5.  Reports her symptoms of what she believed coronavirus are improving but she has increasing swelling redness and occasional amounts of bloody drainage coming from her right chest wound ? ?Currently taking doxycycline ? ?  ? ? ?Physical Exam  ? ?Triage Vital Signs: ?ED Triage Vitals  ?Enc Vitals Group  ?   BP 03/04/22 2116 (!) 126/105  ?   Pulse Rate 03/04/22 2116 86  ?   Resp 03/04/22 2116 20  ?   Temp 03/04/22 2116 98.8 ?F (37.1 ?C)  ?   Temp Source 03/04/22 2116 Oral  ?   SpO2 03/04/22 2116 100 %  ?   Weight 03/04/22 2118 175 lb (79.4 kg)  ?   Height 03/04/22 2118 '5\' 6"'$  (1.676 m)  ?   Head Circumference --   ?   Peak Flow --   ?   Pain Score 03/04/22 2118 8  ?   Pain Loc --   ?   Pain Edu? --   ?   Excl. in [place]? --   ? ? ?Most recent vital signs: ?Vitals:  ? 03/04/22 2230 03/04/22 2323  ?BP: (!) 158/129 (!) 115/59  ?Pulse: 69 79  ?Resp: (!) 21 16  ?Temp:    ?SpO2: 98% 99%  ? ? ? ?General: Awake, no distress.  She and husband both very pleasant.  She appears mildly ill ?Mucous membranes dry ?CV:  Good peripheral perfusion.  Normal rate no murmur ?Right upper chest wall surrounding her incision is notably slightly edematous, erythematous and warm to touch.  There is no active  bleeding or oozing noted from her surgical site and there is no obvious discrete abscess on exam but the entire right upper breast appears erythematous and warm to the touch.  Shiny in nature as well. ?Resp:  Normal effort.  Clear lungs ?Abd:  No distention.  ?Other:  No lower extremity edema or swelling ? ? ?ED Results / Procedures / Treatments  ? ?Labs ?(all labs ordered are listed, but only abnormal results are displayed) ?Labs Reviewed  ?CBC - Abnormal; Notable for the following components:  ?    Result Value  ? WBC 20.0 (*)   ? All other components within normal limits  ?BASIC METABOLIC PANEL - Abnormal; Notable for the following components:  ? Sodium 121 (*)   ? Chloride 87 (*)   ? CO2 17 (*)   ? Glucose, Bld 109 (*)   ? BUN 37 (*)   ? Creatinine, Ser 2.20 (*)   ? Calcium 8.2 (*)   ? GFR, Estimated 22 (*)   ? Anion gap 17 (*)   ? All other components within normal limits  ?LACTIC ACID, PLASMA - Abnormal;  Notable for the following components:  ? Lactic Acid, Venous 2.6 (*)   ? All other components within normal limits  ?PROTIME-INR - Abnormal; Notable for the following components:  ? Prothrombin Time 29.0 (*)   ? INR 2.8 (*)   ? All other components within normal limits  ?CULTURE, BLOOD (ROUTINE X 2)  ?CULTURE, BLOOD (ROUTINE X 2)  ?LACTIC ACID, PLASMA  ?PROTIME-INR  ?CORTISOL-AM, BLOOD  ?PROCALCITONIN  ?BASIC METABOLIC PANEL  ?CBC  ? ? ? ?EKG ? ? ? ? ?RADIOLOGY ? ?CT of the chest is interpreted by for gross pathology, notable inflammatory changes are noticed along the right breast region.  Defer to radiologist for more detailed report as noted below ? ?DG Chest 2 View ? ?Result Date: 03/04/2022 ?CLINICAL DATA:  Shortness of breath, history of recent melanoma removal from the right breast EXAM: CHEST - 2 VIEW COMPARISON:  10/23/2021 FINDINGS: Cardiac shadow is enlarged but stable. Right chest wall port is seen in satisfactory position. The lungs are well aerated bilaterally. Elevation of the right hemidiaphragm  is seen. No acute bony abnormality is noted. IMPRESSION: No acute abnormality noted. Electronically Signed   By: Inez Catalina M.D.   On: 03/04/2022 22:01  ? ?CT Chest Wo Contrast ? ?Result Date: 03/04/2022 ?CLINICAL DATA:  Melanoma removal from right breast 5 days ago, pain and swelling at surgical site, erythema EXAM: CT CHEST WITHOUT CONTRAST TECHNIQUE: Multidetector CT imaging of the chest was performed following the standard protocol without IV contrast. RADIATION DOSE REDUCTION: This exam was performed according to the departmental dose-optimization program which includes automated exposure control, adjustment of the mA and/or kV according to patient size and/or use of iterative reconstruction technique. COMPARISON:  03/04/2022, 05/06/2012 FINDINGS: Cardiovascular: Limited unenhanced imaging of the heart and great vessels demonstrates no pericardial effusion. Mild cardiomegaly. Normal caliber of the thoracic aorta. Moderate atherosclerosis of the aorta and coronary vasculature. Evaluation of the vascular lumen is limited without IV contrast. Right chest wall port via internal jugular approach, tip within the superior vena cava. Mediastinum/Nodes: No enlarged mediastinal or axillary lymph nodes. Thyroid gland, trachea, and esophagus demonstrate no significant findings. Lungs/Pleura: No acute airspace disease, effusion, or pneumothorax. Central airways are widely patent. Upper Abdomen: No acute abnormality. Musculoskeletal: There is d ?iffuse subcutaneous fat stranding within the upper aspect of the right breast and anterior upper right chest wall. No evidence of fluid collection or abscess on this unenhanced exam. Surgical clips and scarring within the right breast consistent with prior lumpectomy. There are no acute or destructive bony lesions. Reconstructed images demonstrate no additional findings. IMPRESSION: 1. Diffuse subcutaneous fat stranding throughout the right anterior chest wall and right breast,  consistent with cellulitis. No evidence of fluid collection or abscess on this unenhanced exam. 2. Mild cardiomegaly. 3. Aortic Atherosclerosis (ICD10-I70.0). Coronary artery atherosclerosis. Electronically Signed   By: Randa Ngo M.D.   On: 03/04/2022 23:14   ? ? ? ?PROCEDURES: ? ?Critical Care performed: Yes, see critical care procedure note(s) ? ?CRITICAL CARE ?Performed by: Delman Kitten ? ? ?Total critical care time: 30 minutes ? ?Critical care time was exclusive of separately billable procedures and treating other patients. ? ?Critical care was necessary to treat or prevent imminent or life-threatening deterioration. ? ?Critical care was time spent personally by me on the following activities: development of treatment plan with patient and/or surrogate as well as nursing, discussions with consultants, evaluation of patient's response to treatment, examination of patient, obtaining history from patient or surrogate,  ordering and performing treatments and interventions, ordering and review of laboratory studies, ordering and review of radiographic studies, pulse oximetry and re-evaluation of patient's condition. ? ?Patient meets sepsis criteria.  Respiratory rate greater than 20, elevated white count and right cellulitic infection.  Code sepsis initiated, broad-spectrum antibiotics ? ?Procedures ? ? ?MEDICATIONS ORDERED IN ED: ?Medications  ?lactated ringers infusion ( Intravenous New Bag/Given 03/04/22 2323)  ?vancomycin (VANCOREADY) IVPB 1750 mg/350 mL (1,750 mg Intravenous New Bag/Given 03/04/22 2322)  ?molnupiravir EUA (LAGEVRIO) capsule 800 mg (has no administration in time range)  ?amiodarone (PACERONE) tablet 200 mg (has no administration in time range)  ?melatonin tablet 5 mg (has no administration in time range)  ?cefTRIAXone (ROCEPHIN) 2 g in sodium chloride 0.9 % 100 mL IVPB (has no administration in time range)  ?acetaminophen (TYLENOL) tablet 650 mg (has no administration in time range)  ?  Or   ?acetaminophen (TYLENOL) suppository 650 mg (has no administration in time range)  ?ondansetron (ZOFRAN) tablet 4 mg (has no administration in time range)  ?  Or  ?ondansetron (ZOFRAN) injection 4 mg (has no admin

## 2022-03-04 NOTE — ED Notes (Signed)
Lactic 2.6; acuity level changed; pt will be taken to next available exam room ?

## 2022-03-04 NOTE — ED Notes (Signed)
Pt returned from radiology at this time.  

## 2022-03-04 NOTE — Assessment & Plan Note (Signed)
Fortunately mild symptoms.  No acute respiratory distress.  Discussed molnupiravir. Opted not to use paxlovid due to drug interactions.  ?I have counseled on lacking long term safely and effectiveness data of medication,molnupiravir.  Explained EUA for molnupiravir. Criteria met for consideration of  Molnupiravir :  covid positive, patient older than 82 years old, started within 5 days of symptom onset ? ?Counseled on adverse effects including nausea, dizziness, diarrhea. ? ?Patient is most comfortable and desires to start [place] . INR will be rechecked in 3 days time. She will call me if INR is abnormal.  ? ?

## 2022-03-04 NOTE — Sepsis Progress Note (Signed)
Elink following Code Sepsis. 

## 2022-03-04 NOTE — Progress Notes (Signed)
Verbal consent for services obtained from Carol [name] prior to services given to TELEPHONE visit:  ? ?Location of call: ? provider at work ?Carol [name] at home ? ?Names of all persons present for services: Carol Paris, NP and Carol [name] ?Chief complaint:  ?Covid positive ?Symptoms started x 5 days ago ?Endorses myalgia which has improved.  ?No fever, cough, joint swelling, sob, leg swelling, cp, palpitations.  ?Eating and drinking.  ?Covid vaccinated  ?H/o melanoma  ?H/o atrial fibrillation-Compliant with warfarin , amiodarone ?INR to be recheck on Wednesday 03/06/22 by Dr Bea Laura ? ?CHF- compliant with lasix 87m ?She is not taking a daily weight however doesn't think she 'gained any weight.'  ? ?H/o lymphoma ? ?She cannot take prednisone, albuterol due to palpitations ? ?Never smoker ?GFR 53 01/10/22 ? ?Wt Readings from Last 3 Encounters:  ?01/17/22 175 lb (79.4 kg)  ?10/23/21 175 lb 6.4 oz (79.6 kg)  ?10/15/21 179 lb 9.6 oz (81.5 kg)  ? ? ?A/P/next steps: ? ?Problem List Items Addressed This Visit   ? ?  ? Other  ? COVID-19 - Primary  ?  Fortunately mild symptoms.  No acute respiratory distress.  Discussed molnupiravir. Opted not to use paxlovid due to drug interactions.  ?I have counseled on lacking long term safely and effectiveness data of medication,molnupiravir.  Explained EUA for molnupiravir. Criteria met for consideration of  Molnupiravir :  covid positive, Carol [name] older than 82years old, started within 5 days of symptom onset ? ?Counseled on adverse effects including nausea, dizziness, diarrhea. ? ?Carol [name] is most comfortable and [place]. INR will be rechecked in 3 days time. She will call me if INR is abnormal.  ? ?  ?  ? Relevant Medications  ? molnupiravir EUA (LAGEVRIO) 200 mg CAPS capsule  ? ? ? ?I spent 11 min  discussing plan of care over the phone.  ? ? ? ? ? ? ? ?

## 2022-03-04 NOTE — Progress Notes (Signed)
CODE SEPSIS - PHARMACY COMMUNICATION ? ?**Broad Spectrum Antibiotics should be administered within 1 hour of Sepsis diagnosis** ? ?Time Code Sepsis Called/Page Received: 2224 ? ?Antibiotics Ordered: Ceftriaxone and Vancomycin ? ?Time of 1st antibiotic administration: 2242 ? ?Renda Rolls, PharmD, MBA ?03/04/2022 ?10:47 PM ? ?

## 2022-03-04 NOTE — Patient Instructions (Signed)
Please call me if INR on 03/06/22 is abnormal.  ? ?You may start PLAIN Mucinex ( guaifenesin) which you can help break up thick congestion.  Please ensure you are drinking plenty of water with this medication. ? ?Please stay in quarantine per cdc guidelines.  ? ?If you test positive for COVID-19, stay home for at least 5 days and isolate from others in your home. You are likely most infectious during these first 5 days. Wear a high-quality mask if you must be around others at home and in public. Do not go places where you are unable to wear a mask. ? ? ?We discussed starting Molnupiravir which is an unapproved drug that is authorized for use under an Emergency Use Authorization.  There are no adequate, approved, available products for the treatment of COVID-19 in adults who have mild-to-moderate COVID-19 and are at high risk for progressing to severe COVID-19, including hospitalization or death. ? ?I have sent  Molnupiravir to your pharmacy. Please call pharmacy so they bring medication out to your car and you do not have to go inside.  ? ?This medication is not recommended in pregnancy.  ?  ?COMMON SIDE EFFECTS: ?Diarrhea ?Nausea ?dizziness ?  ?If your COVID-19 symptoms get worse, get medical help right away. Call 911 if you experience symptoms such as worsening cough, trouble breathing, chest pain that doesn't go away, confusion, a hard time staying awake, and pale or blue-colored skin.This medication won't prevent all COVID-19 cases from getting worse.  ? ?Molnupiravir Oral Capsules ?What is this medication? ?MOLNUPIRAVIR (mol nue pir a vir) treats COVID-19. It is an antiviral medication. It may decrease the risk of developing severe symptoms of COVID-19. It may also decrease the chance of going to the hospital. This medication is not approved by the FDA. The FDA has authorized emergency use of thismedication during the COVID-19 pandemic. ?This medicine may be used for other purposes; ask your health care provider  orpharmacist if you have questions. ?What should I tell my care team before I take this medication? ?They need to know if you have any of these conditions: ?Any allergies ?Any serious illness ?An unusual or allergic reaction to molnupiravir, other medications, foods, dyes, or preservatives ?Pregnant or trying to get pregnant ?Breast-feeding ?How should I use this medication? ?Take this medication by mouth with water. Take it as directed on the prescription label at the same time every day. Do not cut, crush or chew this medication. Swallow the capsules whole. You can take it with or without food. If it upsets your stomach, take it with food. Take all of this medication unless your care team tells you to stop it early. Keep taking it even if youthink you are better. ?Talk to your care team about the use of this medication in children. Specialcare may be needed. ?Overdosage: If you think you have taken too much of this medicine contact apoison control center or emergency room at once. ?NOTE: This medicine is only for you. Do not share this medicine with others. ?What if I miss a dose? ?If you miss a dose, take it as soon as you can unless it is more than 10 hours late. If it is more than 10 hours late, skip the missed dose. Take the next dose at the normal time. Do not take extra or 2 doses at the same time to makeup for the missed dose. ?What may interact with this medication? ?Interactions have not been studied. ?This list may not describe all possible  interactions. Give your health care provider a list of all the medicines, herbs, non-prescription drugs, or dietary supplements you use. Also tell them if you smoke, drink alcohol, or use illegaldrugs. Some items may interact with your medicine. ?What should I watch for while using this medication? ?Your condition will be monitored carefully while you are receiving this medication. Visit your care team for regular checkups. Tell your care team ifyour symptoms do not  start to get better or if they get worse. ?Do not become pregnant while taking this medication. You may need a pregnancy test before starting this medication. Women must use a reliable form of birth control while taking this medication and for 4 days after stopping the medication. Women should inform their care team if they wish to become pregnant or think they might be pregnant. Men should not father a child while taking this medication and for 3 months after stopping it. There is potential for serious harm to an unborn child. Talk to your care team for more information. Do not breast-feed an infant while taking this medication and for 4 days afterstopping the medication. ?What side effects may I notice from receiving this medication? ?Side effects that you should report to your care team as soon as possible: ?Allergic reactions-skin rash, itching, hives, swelling of the face, lips, tongue, or throat ?Side effects that usually do not require medical attention (report these toyour care team if they continue or are bothersome): ?Diarrhea ?Dizziness ?Nausea ?This list may not describe all possible side effects. Call your doctor for medical advice about side effects. You may report side effects to FDA at1-800-FDA-1088. ?Where should I keep my medication? ?Keep out of the reach of children and pets. ?Store at room temperature between 20 and 25 degrees C (68 and 77 degrees F).Get rid of any unused medication after the expiration date. ?To get rid of medications that are no longer needed or have expired: ?Take the medication to a medication take-back program. Check with your pharmacy or law enforcement to find a location. ?If you cannot return the medication, check the label or package insert to see if the medication should be thrown out in the garbage or flushed down the toilet. If you are not sure, ask your care team. If it is safe to put it in the trash, take the medication out of the container. Mix the medication with  cat litter, dirt, coffee grounds, or other unwanted substance. Seal the mixture in a bag or container. Put it in the trash. ?NOTE: This sheet is a summary. It may not cover all possible information. If you have questions about this medicine, talk to your doctor, pharmacist, orhealth care provider. ?? 2022 Elsevier/Gold Standard (2020-10-30 16:16:01) ? ? ? ? ?

## 2022-03-04 NOTE — ED Triage Notes (Signed)
Pt presents via POV c/o bleeding and redness to right breast. Reports had melanoma removed from right breast on Thursday. Reports increased pain, swelling, and redness to right breast since.  ?

## 2022-03-05 DIAGNOSIS — L0390 Cellulitis, unspecified: Secondary | ICD-10-CM | POA: Diagnosis not present

## 2022-03-05 DIAGNOSIS — N179 Acute kidney failure, unspecified: Secondary | ICD-10-CM | POA: Diagnosis present

## 2022-03-05 DIAGNOSIS — I420 Dilated cardiomyopathy: Secondary | ICD-10-CM

## 2022-03-05 DIAGNOSIS — Z7901 Long term (current) use of anticoagulants: Secondary | ICD-10-CM

## 2022-03-05 DIAGNOSIS — C4352 Malignant melanoma of skin of breast: Secondary | ICD-10-CM | POA: Diagnosis present

## 2022-03-05 DIAGNOSIS — A419 Sepsis, unspecified organism: Secondary | ICD-10-CM | POA: Diagnosis present

## 2022-03-05 DIAGNOSIS — I5032 Chronic diastolic (congestive) heart failure: Secondary | ICD-10-CM | POA: Diagnosis present

## 2022-03-05 DIAGNOSIS — A4101 Sepsis due to Methicillin susceptible Staphylococcus aureus: Secondary | ICD-10-CM | POA: Diagnosis present

## 2022-03-05 DIAGNOSIS — T8144XA Sepsis following a procedure, initial encounter: Secondary | ICD-10-CM | POA: Diagnosis present

## 2022-03-05 DIAGNOSIS — E785 Hyperlipidemia, unspecified: Secondary | ICD-10-CM | POA: Diagnosis present

## 2022-03-05 DIAGNOSIS — T8141XA Infection following a procedure, superficial incisional surgical site, initial encounter: Secondary | ICD-10-CM | POA: Diagnosis present

## 2022-03-05 DIAGNOSIS — R6520 Severe sepsis without septic shock: Secondary | ICD-10-CM

## 2022-03-05 DIAGNOSIS — N61 Mastitis without abscess: Secondary | ICD-10-CM | POA: Diagnosis present

## 2022-03-05 DIAGNOSIS — U071 COVID-19: Secondary | ICD-10-CM | POA: Diagnosis present

## 2022-03-05 DIAGNOSIS — Z881 Allergy status to other antibiotic agents status: Secondary | ICD-10-CM | POA: Diagnosis not present

## 2022-03-05 DIAGNOSIS — Z8572 Personal history of non-Hodgkin lymphomas: Secondary | ICD-10-CM | POA: Diagnosis not present

## 2022-03-05 DIAGNOSIS — E871 Hypo-osmolality and hyponatremia: Secondary | ICD-10-CM | POA: Diagnosis present

## 2022-03-05 DIAGNOSIS — Z8249 Family history of ischemic heart disease and other diseases of the circulatory system: Secondary | ICD-10-CM | POA: Diagnosis not present

## 2022-03-05 DIAGNOSIS — Z885 Allergy status to narcotic agent status: Secondary | ICD-10-CM | POA: Diagnosis not present

## 2022-03-05 DIAGNOSIS — Z841 Family history of disorders of kidney and ureter: Secondary | ICD-10-CM | POA: Diagnosis not present

## 2022-03-05 DIAGNOSIS — Y838 Other surgical procedures as the cause of abnormal reaction of the patient, or of later complication, without mention of misadventure at the time of the procedure: Secondary | ICD-10-CM | POA: Diagnosis present

## 2022-03-05 DIAGNOSIS — E872 Acidosis, unspecified: Secondary | ICD-10-CM | POA: Diagnosis present

## 2022-03-05 DIAGNOSIS — I5030 Unspecified diastolic (congestive) heart failure: Secondary | ICD-10-CM

## 2022-03-05 DIAGNOSIS — I480 Paroxysmal atrial fibrillation: Secondary | ICD-10-CM | POA: Diagnosis present

## 2022-03-05 DIAGNOSIS — Z800 Family history of malignant neoplasm of digestive organs: Secondary | ICD-10-CM | POA: Diagnosis not present

## 2022-03-05 DIAGNOSIS — Z888 Allergy status to other drugs, medicaments and biological substances status: Secondary | ICD-10-CM | POA: Diagnosis not present

## 2022-03-05 LAB — PROTIME-INR
INR: 3.2 — ABNORMAL HIGH (ref 0.8–1.2)
Prothrombin Time: 32.6 seconds — ABNORMAL HIGH (ref 11.4–15.2)

## 2022-03-05 LAB — CBC
HCT: 35.1 % — ABNORMAL LOW (ref 36.0–46.0)
Hemoglobin: 11.8 g/dL — ABNORMAL LOW (ref 12.0–15.0)
MCH: 31.7 pg (ref 26.0–34.0)
MCHC: 33.6 g/dL (ref 30.0–36.0)
MCV: 94.4 fL (ref 80.0–100.0)
Platelets: 195 10*3/uL (ref 150–400)
RBC: 3.72 MIL/uL — ABNORMAL LOW (ref 3.87–5.11)
RDW: 14.3 % (ref 11.5–15.5)
WBC: 17.1 10*3/uL — ABNORMAL HIGH (ref 4.0–10.5)
nRBC: 0 % (ref 0.0–0.2)

## 2022-03-05 LAB — CORTISOL-AM, BLOOD: Cortisol - AM: 41.4 ug/dL — ABNORMAL HIGH (ref 6.7–22.6)

## 2022-03-05 LAB — BASIC METABOLIC PANEL
Anion gap: 10 (ref 5–15)
BUN: 35 mg/dL — ABNORMAL HIGH (ref 8–23)
CO2: 22 mmol/L (ref 22–32)
Calcium: 8.6 mg/dL — ABNORMAL LOW (ref 8.9–10.3)
Chloride: 94 mmol/L — ABNORMAL LOW (ref 98–111)
Creatinine, Ser: 1.85 mg/dL — ABNORMAL HIGH (ref 0.44–1.00)
GFR, Estimated: 27 mL/min — ABNORMAL LOW (ref >60)
Glucose, Bld: 107 mg/dL — ABNORMAL HIGH (ref 70–99)
Potassium: 4.4 mmol/L (ref 3.5–5.1)
Sodium: 126 mmol/L — ABNORMAL LOW (ref 135–145)

## 2022-03-05 LAB — PROCALCITONIN: Procalcitonin: 2.32 ng/mL

## 2022-03-05 MED ORDER — GABAPENTIN 100 MG PO CAPS
100.0000 mg | ORAL_CAPSULE | Freq: Every morning | ORAL | Status: DC
  Administered 2022-03-05 – 2022-03-12 (×8): 100 mg via ORAL
  Filled 2022-03-05 (×8): qty 1

## 2022-03-05 MED ORDER — SODIUM CHLORIDE 0.9 % IV SOLN: 2.0000 g | INTRAVENOUS | Status: DC

## 2022-03-05 MED ORDER — GABAPENTIN 100 MG PO CAPS
200.0000 mg | ORAL_CAPSULE | Freq: Every day | ORAL | Status: DC
  Administered 2022-03-05 – 2022-03-11 (×7): 200 mg via ORAL
  Filled 2022-03-05 (×7): qty 2

## 2022-03-05 MED ORDER — WARFARIN - PHARMACIST DOSING INPATIENT
Freq: Every day | Status: DC
Start: 2022-03-05 — End: 2022-03-12
  Filled 2022-03-05: qty 1

## 2022-03-05 MED ORDER — MOLNUPIRAVIR EUA 200MG CAPSULE
4.0000 | ORAL_CAPSULE | Freq: Two times a day (BID) | ORAL | Status: AC
  Administered 2022-03-05 – 2022-03-08 (×9): 800 mg via ORAL
  Filled 2022-03-05: qty 4

## 2022-03-05 MED ORDER — ONDANSETRON HCL 4 MG/2ML IJ SOLN: 4.0000 mg | Freq: Four times a day (QID) | INTRAMUSCULAR | Status: DC | PRN

## 2022-03-05 MED ORDER — VANCOMYCIN HCL IN DEXTROSE 1-5 GM/200ML-% IV SOLN
1000.0000 mg | INTRAVENOUS | Status: DC
  Administered 2022-03-05: 1000 mg via INTRAVENOUS
  Filled 2022-03-05: qty 200

## 2022-03-05 MED ORDER — MELATONIN 5 MG PO TABS: 5.0000 mg | ORAL_TABLET | Freq: Every evening | ORAL | Status: DC | PRN

## 2022-03-05 MED ORDER — PANTOPRAZOLE SODIUM 40 MG PO TBEC
40.0000 mg | DELAYED_RELEASE_TABLET | Freq: Every day | ORAL | Status: DC
  Administered 2022-03-05 – 2022-03-12 (×8): 40 mg via ORAL
  Filled 2022-03-05 (×7): qty 1

## 2022-03-05 MED ORDER — ACETAMINOPHEN 325 MG PO TABS: 650.0000 mg | ORAL_TABLET | Freq: Four times a day (QID) | ORAL | Status: DC | PRN

## 2022-03-05 MED ORDER — ACETAMINOPHEN 650 MG RE SUPP: 650.0000 mg | Freq: Four times a day (QID) | RECTAL | Status: DC | PRN

## 2022-03-05 MED ORDER — GABAPENTIN 100 MG PO CAPS: 200.0000 mg | ORAL_CAPSULE | Freq: Every day | ORAL | Status: DC

## 2022-03-05 MED ORDER — WARFARIN SODIUM 3 MG PO TABS
3.0000 mg | ORAL_TABLET | Freq: Once | ORAL | Status: AC
  Administered 2022-03-05: 3 mg via ORAL
  Filled 2022-03-05: qty 1

## 2022-03-05 MED ORDER — GABAPENTIN 100 MG PO CAPS: 100.0000 mg | ORAL_CAPSULE | Freq: Every day | ORAL | Status: DC

## 2022-03-05 MED ORDER — VANCOMYCIN HCL 500 MG/100ML IV SOLN: 500.0000 mg | INTRAVENOUS | Status: DC

## 2022-03-05 MED ORDER — DILTIAZEM HCL ER 180 MG PO TB24
180.0000 mg | ORAL_TABLET | Freq: Every day | ORAL | Status: DC
  Administered 2022-03-05 – 2022-03-12 (×8): 180 mg via ORAL
  Filled 2022-03-05 (×14): qty 1

## 2022-03-05 MED ORDER — MORPHINE SULFATE (PF) 2 MG/ML IV SOLN: 2.0000 mg | INTRAVENOUS | Status: DC | PRN

## 2022-03-05 MED ORDER — SODIUM CHLORIDE 0.9 % IV SOLN
2.0000 g | INTRAVENOUS | Status: DC
  Administered 2022-03-05: 2 g via INTRAVENOUS
  Filled 2022-03-05: qty 12.5

## 2022-03-05 MED ORDER — ONDANSETRON HCL 4 MG PO TABS: 4.0000 mg | ORAL_TABLET | Freq: Four times a day (QID) | ORAL | Status: DC | PRN

## 2022-03-05 MED ORDER — AMIODARONE HCL 200 MG PO TABS
200.0000 mg | ORAL_TABLET | Freq: Two times a day (BID) | ORAL | Status: DC
  Administered 2022-03-05 – 2022-03-12 (×16): 200 mg via ORAL
  Filled 2022-03-05 (×16): qty 1

## 2022-03-05 NOTE — Assessment & Plan Note (Addendum)
--  fever, leukocytosis and elevated lactic acid, source is cellulitis ?

## 2022-03-05 NOTE — Progress Notes (Signed)
Admission profile updated. ?

## 2022-03-05 NOTE — Progress Notes (Signed)
Pharmacy Antibiotic Note ? ?Carol [name] is a 81 y.o. female admitted on 03/04/2022 with cellulitis.  Pharmacy has been consulted for Vancomycin dosing x 7 days. ? ?Plan: Scr 2.2 > 1.85 ?Pt given initial dose of Vancomycin 1750 (5/01 2200); adjust maintenance to '1000mg'$  q36h (5/02 2200) ?Goal AUC 400-550. ?Expected AUC: 508 ?SCr used: 1.85 ; Vd 0.72; IBW ? ?New consult for Cefepime for tx of wound infection ?Continue Cefepime 2 gm q24h per indication and renal fxn. ? ?Height: '5\' 6"'$  (167.6 cm) ?Weight: 79.4 kg (175 lb) ?IBW/kg (Calculated) : 59.3 ? ?Temp (24hrs), Avg:98.8 ?F (37.1 ?C), Min:98.8 ?F (37.1 ?C), Max:98.8 ?F (37.1 ?C) ? ?Recent Labs  ?Lab 03/04/22 ?2128 03/04/22 ?2238 03/05/22 ?0424  ?WBC 20.0*  --  17.1*  ?CREATININE 2.20*  --  1.85*  ?LATICACIDVEN 2.6* 1.5  --   ? ?  ?Estimated Creatinine Clearance: 25.3 mL/min (A) (by C-G formula based on SCr of 1.85 mg/dL (H)).   ? ?Allergies  ?Allergen Reactions  ? Prednisone Palpitations  ?  A. fib  ? Pulmicort [Budesonide] Shortness Of Breath, Itching, Swelling and Other (See Comments)  ?  Felt as if things were crawly on her  ? Clonidine Other (See Comments)  ?  Pt felt like things were crawling on her arms.  ? Lotemax [Loteprednol Etabonate]   ?  Broke out around the eye  ? Morphine And Related Itching and Other (See Comments)  ?  "creepy crawly feeling"  ? Trovan [Alatrofloxacin Mesylate] Other (See Comments)  ?  "effected the whole system"  ? Zelnorm [Tegaserod Maleate] Itching  ?  Felt as if things were crawly on her ?  ? Erythromycin Rash  ? ? ?Antimicrobials this admission: ?5/01 Ceftriaxone >> x 1 dose ?5/01 Vancomycin >> x 7 days ?5/02 Cefepime >>  ? ?Microbiology results: ?5/01 BCx: Pending ? ?Thank you for allowing pharmacy to be a part of this patient?s care. ? ?Renda Rolls, PharmD, MBA ?03/05/2022 ?10:40 AM ? ? ?

## 2022-03-05 NOTE — Progress Notes (Signed)
Pharmacy Antibiotic Note ? ?Carol [name] is a 81 y.o. female admitted on 03/04/2022 with cellulitis.  Pharmacy has been consulted for Vancomycin dosing x 7 days. ? ?Plan: ?Pt given initial dose of Vancomycin 1750 ?Vancomycin 500 mg IV Q 24 hrs.  ?Goal AUC 400-550. ?Expected AUC: 437.7 ?SCr used: 2.2 ? ?New consult for Cefepime for tx of wound infection ?D/C Ceftriaxone ?Cefepime 2 gm q24h per indication and renal fxn. ? ?Height: '5\' 6"'$  (167.6 cm) ?Weight: 79.4 kg (175 lb) ?IBW/kg (Calculated) : 59.3 ? ?Temp (24hrs), Avg:98.8 ?F (37.1 ?C), Min:98.8 ?F (37.1 ?C), Max:98.8 ?F (37.1 ?C) ? ?Recent Labs  ?Lab 03/04/22 ?2128 03/04/22 ?2238  ?WBC 20.0*  --   ?CREATININE 2.20*  --   ?LATICACIDVEN 2.6* 1.5  ? ?  ?Estimated Creatinine Clearance: 21.3 mL/min (A) (by C-G formula based on SCr of 2.2 mg/dL (H)).   ? ?Allergies  ?Allergen Reactions  ? Prednisone Palpitations  ?  A. fib  ? Pulmicort [Budesonide] Shortness Of Breath, Itching, Swelling and Other (See Comments)  ?  Felt as if things were crawly on her  ? Clonidine Other (See Comments)  ?  Pt felt like things were crawling on her arms.  ? Lotemax [Loteprednol Etabonate]   ?  Broke out around the eye  ? Morphine And Related Itching and Other (See Comments)  ?  "creepy crawly feeling"  ? Trovan [Alatrofloxacin Mesylate] Other (See Comments)  ?  "effected the whole system"  ? Zelnorm [Tegaserod Maleate] Itching  ?  Felt as if things were crawly on her ?  ? Erythromycin Rash  ? ? ?Antimicrobials this admission: ?5/01 Ceftriaxone >> x 1 dose ?5/01 Vancomycin >> x 7 days ?5/02 Cefepime >>  ? ?Microbiology results: ?5/01 BCx: Pending ? ?Thank you for allowing pharmacy to be a part of this patient?s care. ? ?Renda Rolls, PharmD, MBA ?03/05/2022 ?4:20 AM ? ? ?

## 2022-03-05 NOTE — Progress Notes (Signed)
?  Progress Note ? ? ?Patient: Carol [name] XBJ:[id number] DOB: [birth year] DOA: 03/04/2022     0 ?DOS: the patient was seen and examined on 03/05/2022 ?  ?Brief hospital course: ?No notes on file ? ?Assessment and Plan: ?* Severe sepsis ([place]) ?--fever, leukocytosis and elevated lactic acid, source is cellulitis ? ?Surgical wound infection ?Patient is s/p resection of melanoma on 4/27 and reports increased drainage ?--CT chest showed Diffuse subcutaneous fat stranding throughout the right anterior chest wall and right breast, consistent with cellulitis. No evidence of fluid collection or abscess on this unenhanced exam. ?--started on vanc and cefepime on presentation ?Plan: ?--cont vanc and cefepime for now ? ?AKI (acute kidney injury) with anion gap metabolic acidosis ([place]) ?Suspect prerenal and related to sepsis ?Continue IV hydration for today ? ?Hyponatremia ?Suspecting hypovolemic ?Improved with IVF. ? ?Atrial fibrillation s/p ablation 2022, s/p cardioversion 02/2022 [facility]) ?--currently rate controlled ?Plan: ?--Continue amiodarone and warfarin ?--resume home cardizem ?--hold home Toprol for now due to soft BP ? ?Warfarin anticoagulation ?Pharmacy to adjust warfarin for goal INR 2-3 ? ?(HFpEF) heart failure with preserved ejection fraction ([place]) ?Compensated ?--hold home Lasix 2/2 AKI ? ?COVID-19 virus infection ?Asymptomatic, no hypoxia ?Continue molnupiravir from 5/1 to 5/6 ? ? ? ? ?  ? ?Subjective:  ?Pt reported swelling in her right lateral chest, axilla which made it hard for her to raise her right arm. ? ? ?Physical Exam: ? ?Constitutional: NAD, AAOx3 ?HEENT: conjunctivae and lids normal, EOMI ?CV: No cyanosis.   ?RESP: normal respiratory effort, on RA ?Neuro: II - XII grossly intact.   ?Psych: Normal mood and affect.  Appropriate judgement and reason ? ?Photo taken on 03/05/22 ? ? ? ?Data Reviewed: ? ?Family Communication:  ? ?Disposition: ?Status is: Inpatient ? ? Planned Discharge Destination:  Home ? ? ? ?No charge note. ? ?Author: ?Enzo Bi, MD ?03/05/2022 5:23 PM ? ?For on call review [email or web address].  ?

## 2022-03-05 NOTE — ED Notes (Signed)
Dr. Duncan @ the bedside. 

## 2022-03-05 NOTE — Progress Notes (Signed)
Pharmacy Antibiotic Note ? ?Carol [name] is a 81 y.o. female admitted on 03/04/2022 with cellulitis.  Pharmacy has been consulted for Vancomycin dosing x 7 days. ? ?Plan: ?Pt given initial dose of Vancomycin 1750 ?Vancomycin 500 mg IV Q 24 hrs.  ?Goal AUC 400-550. ?Expected AUC: 437.7 ?SCr used: 2.2 ? ? ?Height: '5\' 6"'$  (167.6 cm) ?Weight: 79.4 kg (175 lb) ?IBW/kg (Calculated) : 59.3 ? ?Temp (24hrs), Avg:98.8 ?F (37.1 ?C), Min:98.8 ?F (37.1 ?C), Max:98.8 ?F (37.1 ?C) ? ?Recent Labs  ?Lab 03/04/22 ?2128 03/04/22 ?2238  ?WBC 20.0*  --   ?CREATININE 2.20*  --   ?LATICACIDVEN 2.6* 1.5  ?  ?Estimated Creatinine Clearance: 21.3 mL/min (A) (by C-G formula based on SCr of 2.2 mg/dL (H)).   ? ?Allergies  ?Allergen Reactions  ? Prednisone Palpitations  ?  A. fib  ? Pulmicort [Budesonide] Shortness Of Breath, Itching, Swelling and Other (See Comments)  ?  Felt as if things were crawly on her  ? Clonidine Other (See Comments)  ?  Pt felt like things were crawling on her arms.  ? Lotemax [Loteprednol Etabonate]   ?  Broke out around the eye  ? Morphine And Related Itching and Other (See Comments)  ?  "creepy crawly feeling"  ? Trovan [Alatrofloxacin Mesylate] Other (See Comments)  ?  "effected the whole system"  ? Zelnorm [Tegaserod Maleate] Itching  ?  Felt as if things were crawly on her ?  ? Erythromycin Rash  ? ? ?Antimicrobials this admission: ?5/01 Ceftriaxone >> x 7 days ?5/01 Vancomycin >> x 7 days ? ?Microbiology results: ?5/01 BCx: Pending ? ?Thank you for allowing pharmacy to be a part of this patient?s care. ? ?Renda Rolls, PharmD, MBA ?03/05/2022 ?3:54 AM ? ? ?

## 2022-03-05 NOTE — ED Notes (Signed)
Pt attempted to stand to get to the [facility] but was too weak to do so; pt agreed to a purewick.  ?

## 2022-03-05 NOTE — Assessment & Plan Note (Addendum)
Asymptomatic, no hypoxia ?Continue molnupiravir from 5/1 to 5/6 ?

## 2022-03-05 NOTE — Assessment & Plan Note (Addendum)
Patient is s/p resection of melanoma on 4/27 and reports increased drainage ?--CT chest showed Diffuse subcutaneous fat stranding throughout the right anterior chest wall and right breast, consistent with cellulitis. No evidence of fluid collection or abscess on this unenhanced exam. ?--started on vanc and cefepime on presentation ?Plan: ?--cont vanc and cefepime for now ?

## 2022-03-05 NOTE — Consult Note (Signed)
ANTICOAGULATION CONSULT NOTE - Consult ? ?Pharmacy Consult for warfarin management ?Indication: atrial fibrillation ? ?Allergies  ?Allergen Reactions  ? Prednisone Palpitations  ?  A. fib  ? Pulmicort [Budesonide] Shortness Of Breath, Itching, Swelling and Other (See Comments)  ?  Felt as if things were crawly on her  ? Clonidine Other (See Comments)  ?  Pt felt like things were crawling on her arms.  ? Lotemax [Loteprednol Etabonate]   ?  Broke out around the eye  ? Morphine And Related Itching and Other (See Comments)  ?  "creepy crawly feeling"  ? Trovan [Alatrofloxacin Mesylate] Other (See Comments)  ?  "effected the whole system"  ? Zelnorm [Tegaserod Maleate] Itching  ?  Felt as if things were crawly on her ?  ? Erythromycin Rash  ? ? ?Patient Measurements: ?Height: '5\' 6"'$  (167.6 cm) ?Weight: 79.4 kg (175 lb) ?IBW/kg (Calculated) : 59.3 ?Heparin Dosing Weight:  ? ?Vital Signs: ?BP: 108/60 (05/02 0900) ?Pulse Rate: 79 (05/02 0900) ? ?Labs: ?Recent Labs  ?  03/04/22 ?2128 03/05/22 ?0424  ?HGB 12.7 11.8*  ?HCT 38.4 35.1*  ?PLT 208 195  ?LABPROT 29.0* 32.6*  ?INR 2.8* 3.2*  ?CREATININE 2.20* 1.85*  ? ? ?Estimated Creatinine Clearance: 25.3 mL/min (A) (by C-G formula based on SCr of 1.85 mg/dL (H)). ? ? ?Medications:  ?VKA PTA was taking '5mg'$  on Mon/Fri  & '4mg'$  All other days of the week. ? ?DDIs:  ?amio - pt has been on chronically at stable dose. No concerns/adjustments needed at this time. ?Antibiotics (Vanc/CFP) - likely to increase INR. Will need to adjust per result and watch time of discontinuation. ?Tylenol if >2g/day can increase INR - current utilization not concerning ? ?Assessment: ?81yo female HFpEF, A-fib (on amiodarone and Coumadin s/p ablation April 2022 and multiple cardioversions most recently 02/2022), & (s/p resection of melanoma right breast on 02/28/22) presenting to ED with increased pain, swelling and redness to the right breast in spite of doxycycline started a few days prior. ?   ?Goal of  Therapy:  ?INR 2-3 ?Monitor platelets by anticoagulation protocol: Yes ?  ?Plan:  ?INR 2.8>3.2 slightly above goal (delta of 0.4). (last dose - '5mg'$  PTA); today's usual VKA dose would have been '4mg'$ , however will reduce slightly to '3mg'$  tonight given rise in an INR likely from broad spectrum antibiotics and poor intake PTA. ?CTM daily INR and adjust as needed. ? ?Shanon Brow Ghadeer Kastelic ?03/05/2022,10:54 AM ? ? ?

## 2022-03-05 NOTE — Assessment & Plan Note (Addendum)
Compensated ?--hold home Lasix 2/2 AKI ?

## 2022-03-05 NOTE — H&P (Signed)
?History and Physical  ? ? ?Patient: Carol [name] ZOX:[id number] DOB: 02-23-1941 ?DOA: 03/04/2022 ?DOS: the patient was seen and examined on 03/05/2022 ?PCP: Crecencio Mc, MD  ?Patient coming from: Home ? ?Chief Complaint:  ?Chief Complaint  ?Patient presents with  ? Breast Problem  ? ? ?HPI: Carol [name] is a 81 y.o. female with medical history significant for HFpEF, A-fib on amiodarone and Coumadin s/p ablation April 2022 and multiple cardioversions most recently 02/2022, who is s/p resection of melanoma right breast on 02/2722 who presents to the ED with increased pain, swelling and redness to the right breast in spite of doxycycline started a few days prior.  Patient also complains of generalized malaise and congestion and tested positive for COVID 5 days ago but states symptoms of congestion have been improving. ?ED course and data review: Afebrile with pulse of 86 respirations 20 with BP 126/105 going as low as 115/59.  WBC 20,000 with lactic acid 1.5.  Sodium 121, creatinine 2.20 up from baseline of 0.86 with bicarb 17 and anion gap 17.  INR 2.8 ?EKG, personally viewed and interpreted: NSR at 79 with nonspecific ST-T wave changes.  CT chest without contrast shows the following ?IMPRESSION: ?1. Diffuse subcutaneous fat stranding throughout the right anterior ?chest wall and right breast, consistent with cellulitis. No evidence ?of fluid collection or abscess on this unenhanced exam. ?2. Mild cardiomegaly. ? ?Patient given an IV fluid bolus, started on Rocephin and vancomycin.  Hospitalist consulted for admission.  ? ?Review of Systems: As mentioned in the history of present illness. All other systems reviewed and are negative. ?Past Medical History:  ?Diagnosis Date  ? A-fib ([place])   ? Chronic sinusitis   ? Fibrocystic breast disease   ? History of pneumonia 1999  ? Hyperlipidemia   ? Irritable bowel syndrome   ? Lichen planus   ? lymphoma August 2013  ? Low grade B cell  ? Mild tricuspid  insufficiency Jan 2012  ? ECHO, [place]  ? Moderate mitral insufficiency JAN 2012  ? ECHO, [place]  ? ?Past Surgical History:  ?Procedure Laterality Date  ? ABDOMINAL HYSTERECTOMY    ? precancerous cervix,    ? ABLATION OF DYSRHYTHMIC FOCUS  August 2013  ? [place], Dr. Marcello Moores  ? BREAST SURGERY    ? bilateral, benign biopsies  ? CARDIOVERSION N/A 11/23/2020  ? Procedure: CARDIOVERSION;  Surgeon: Corey Skains, MD;  Location: [facility] ORS;  Service: Cardiovascular;  Laterality: N/A;  ? CARDIOVERSION N/A 01/17/2021  ? Procedure: CARDIOVERSION;  Surgeon: Corey Skains, MD;  Location: [facility];  Service: Cardiovascular;  Laterality: N/A;  ? CARDIOVERSION N/A 01/31/2021  ? Procedure: CARDIOVERSION;  Surgeon: Corey Skains, MD;  Location: [facility];  Service: Cardiovascular;  Laterality: N/A;  ? CARDIOVERSION N/A 10/09/2021  ? Procedure: CARDIOVERSION;  Surgeon: Corey Skains, MD;  Location: [facility];  Service: Cardiovascular;  Laterality: N/A;  ? CARDIOVERSION N/A 01/17/2022  ? Procedure: CARDIOVERSION;  Surgeon: Corey Skains, MD;  Location: [facility];  Service: Cardiovascular;  Laterality: N/A;  ? MAXILLARY SINUS LIFT  1990's  ? Mare Ferrari, [facility]  ? oophorectomy    ? squamous cell removal Right   ? right leg calf area.  ? TEE WITHOUT CARDIOVERSION N/A 01/31/2021  ? Procedure: TRANSESOPHAGEAL ECHOCARDIOGRAM (TEE);  Surgeon: Corey Skains, MD;  Location: [facility];  Service: Cardiovascular;  Laterality: N/A;  ? TEE WITHOUT CARDIOVERSION N/A 10/09/2021  ? Procedure: TRANSESOPHAGEAL ECHOCARDIOGRAM (TEE);  Surgeon: [name]  J, MD;  Location: [facility];  Service: Cardiovascular;  Laterality: N/A;  ? ?Social History:  reports that she has never smoked. She has never used smokeless tobacco. She reports current alcohol use of about 4.0 standard drinks per week. She reports that she does not use drugs. ? ?Allergies  ?Allergen Reactions  ? Prednisone Palpitations  ?  A. fib  ? Pulmicort [Budesonide] Shortness Of Breath,  Itching, Swelling and Other (See Comments)  ?  Felt as if things were crawly on her  ? Clonidine Other (See Comments)  ?  Pt felt like things were crawling on her arms.  ? Lotemax [Loteprednol Etabonate]   ?  Broke out around the eye  ? Morphine And Related Itching and Other (See Comments)  ?  "creepy crawly feeling"  ? Trovan [Alatrofloxacin Mesylate] Other (See Comments)  ?  "effected the whole system"  ? Zelnorm [Tegaserod Maleate] Itching  ?  Felt as if things were crawly on her ?  ? Erythromycin Rash  ? ? ?Family History  ?Problem Relation Age of Onset  ? Cancer Mother   ?     Breast  ? Hyperlipidemia Father   ? Heart disease Father   ? Hypertension Father   ? Kidney disease Father   ? Cancer Maternal Grandfather   ?     colon CA  ? Diverticulitis Sister   ? ? ?Prior to Admission medications   ?Medication Sig Start Date End Date Taking? Authorizing Provider  ?acetaminophen (TYLENOL) 500 MG tablet Take 1,000 mg by mouth every 6 (six) hours as needed for moderate pain or headache.    [provider]  ?albuterol (VENTOLIN HFA) 108 (90 Base) MCG/ACT inhaler Inhale 2 puffs into the lungs every 6 (six) hours as needed for wheezing or shortness of breath (cough). ?Patient not taking: Reported on 03/04/2022 10/24/21   Burnard Hawthorne, FNP  ?amiodarone (PACERONE) 200 MG tablet Take 200 mg by mouth 2 (two) times daily.    [provider]  ?b complex vitamins capsule Take 1 capsule by mouth daily.    [provider]  ?bisacodyl (DULCOLAX) 5 MG EC tablet Take 5 mg by mouth daily as needed for moderate constipation.    [provider]  ?Cholecalciferol (VITAMIN D) 50 MCG (2000 UT) tablet Take 2,000 Units by mouth daily.    [provider]  ?diltiazem (DILACOR XR) 180 MG 24 hr capsule Take 180 mg by mouth daily.    [provider]  ?diphenhydrAMINE (BENADRYL) 25 mg capsule Take 25 mg by mouth every 6 (six) hours as needed for allergies.    [provider]   ?docusate sodium (COLACE) 100 MG capsule Take 100 mg by mouth daily as needed for mild constipation.    [provider]  ?furosemide (LASIX) 40 MG tablet Take 1 tablet (40 mg total) by mouth daily. More if directed by physician 10/15/21   Crecencio Mc, MD  ?gabapentin (NEURONTIN) 100 MG capsule Take 2 capsules (200 mg total) by mouth 3 (three) times daily. ?Patient taking differently: Take 100-200 mg by mouth See admin instructions. Patient taking 100 mg in the morning and 200 mg at night. 07/23/21   Crecencio Mc, MD  ?Glucosamine-Chondroit-Vit C-Mn (GLUCOSAMINE 1500 COMPLEX PO) Take 1 tablet by mouth 2 (two) times daily.    [provider]  ?Glycerin-Polysorbate 80 (REFRESH DRY EYE THERAPY OP) Place 1 drop into both eyes 2 (two) times daily as needed (dry eyes).  [provider]  ?MAGNESIUM CITRATE PO Take 400 mg by mouth 2 (two) times daily.    [provider]  ?Melatonin 5 MG CAPS Take 5 mg by mouth at bedtime as needed (sleep).    [provider]  ?metoprolol succinate (TOPROL-XL) 50 MG 24 hr tablet Take 50-100 mg by mouth See admin instructions. Take 100 mg in the morning and 50 mg at night 10/05/21   [provider]  ?molnupiravir EUA (LAGEVRIO) 200 mg CAPS capsule Take 4 capsules (800 mg total) by mouth 2 (two) times daily for 5 days. 03/04/22 03/09/22  Burnard Hawthorne, FNP  ?Multiple Vitamin (MULTIVITAMIN WITH MINERALS) TABS tablet Take 0.5 tablets by mouth 2 (two) times daily.    [provider]  ?Omega-3 Fatty Acids (FISH OIL) 1000 MG CAPS Take 2,000 mg by mouth in the morning, at noon, and at bedtime.    [provider]  ?omeprazole (PRILOSEC) 20 MG capsule TAKE 1 CAPSULE DAILY 11/06/21   Crecencio Mc, MD  ?rosuvastatin (CRESTOR) 5 MG tablet Take 5 mg by mouth 2 (two) times a week. Mondays and Thursdays    [provider]  ?vitamin E 1000 UNIT capsule Take 1,000 Units by mouth 2 (two) times daily.    [provider]  ?warfarin (COUMADIN) 4 MG tablet Take 4 mg by mouth See admin instructions. Take 4 mg daily on Sun, Tues, Wed, Thurs, and Sat    [provider]  ?warfarin (COUMADIN) 5 MG tablet Take 5 mg by

## 2022-03-05 NOTE — Assessment & Plan Note (Addendum)
Suspecting hypovolemic ?Improved with IVF. ?

## 2022-03-05 NOTE — Assessment & Plan Note (Signed)
Pharmacy to adjust warfarin for goal INR 2-3 ?

## 2022-03-05 NOTE — Assessment & Plan Note (Addendum)
--  currently rate controlled ?Plan: ?--Continue amiodarone and warfarin ?--resume home cardizem ?--hold home Toprol for now due to soft BP ?

## 2022-03-05 NOTE — Assessment & Plan Note (Addendum)
Suspect prerenal and related to sepsis ?Continue IV hydration for today ?

## 2022-03-06 DIAGNOSIS — A419 Sepsis, unspecified organism: Secondary | ICD-10-CM | POA: Diagnosis not present

## 2022-03-06 DIAGNOSIS — R6520 Severe sepsis without septic shock: Secondary | ICD-10-CM | POA: Diagnosis not present

## 2022-03-06 LAB — CBC
HCT: 31.5 % — ABNORMAL LOW (ref 36.0–46.0)
Hemoglobin: 11 g/dL — ABNORMAL LOW (ref 12.0–15.0)
MCH: 32.3 pg (ref 26.0–34.0)
MCHC: 34.9 g/dL (ref 30.0–36.0)
MCV: 92.4 fL (ref 80.0–100.0)
Platelets: 192 10*3/uL (ref 150–400)
RBC: 3.41 MIL/uL — ABNORMAL LOW (ref 3.87–5.11)
RDW: 14.2 % (ref 11.5–15.5)
WBC: 13.5 10*3/uL — ABNORMAL HIGH (ref 4.0–10.5)
nRBC: 0 % (ref 0.0–0.2)

## 2022-03-06 LAB — BASIC METABOLIC PANEL
Anion gap: 7 (ref 5–15)
BUN: 21 mg/dL (ref 8–23)
CO2: 22 mmol/L (ref 22–32)
Calcium: 8.2 mg/dL — ABNORMAL LOW (ref 8.9–10.3)
Chloride: 105 mmol/L (ref 98–111)
Creatinine, Ser: 0.94 mg/dL (ref 0.44–1.00)
GFR, Estimated: >60 mL/min (ref >60)
Glucose, Bld: 113 mg/dL — ABNORMAL HIGH (ref 70–99)
Potassium: 3.1 mmol/L — ABNORMAL LOW (ref 3.5–5.1)
Sodium: 134 mmol/L — ABNORMAL LOW (ref 135–145)

## 2022-03-06 LAB — MAGNESIUM: Magnesium: 2 mg/dL (ref 1.7–2.4)

## 2022-03-06 LAB — PROTIME-INR
INR: 2.9 — ABNORMAL HIGH (ref 0.8–1.2)
Prothrombin Time: 29.9 seconds — ABNORMAL HIGH (ref 11.4–15.2)

## 2022-03-06 MED ORDER — DIPHENHYDRAMINE HCL 25 MG PO CAPS
25.0000 mg | ORAL_CAPSULE | Freq: Four times a day (QID) | ORAL | Status: DC | PRN
  Administered 2022-03-12: 25 mg via ORAL
  Filled 2022-03-06: qty 1

## 2022-03-06 MED ORDER — VANCOMYCIN HCL 1250 MG/250ML IV SOLN
1250.0000 mg | INTRAVENOUS | Status: AC
  Administered 2022-03-07 – 2022-03-10 (×5): 1250 mg via INTRAVENOUS
  Filled 2022-03-06 (×5): qty 250

## 2022-03-06 MED ORDER — GABAPENTIN 100 MG PO CAPS: 100.0000 mg | ORAL_CAPSULE | ORAL | Status: DC

## 2022-03-06 MED ORDER — VITAMIN D 25 MCG (1000 UNIT) PO TABS
2000.0000 [IU] | ORAL_TABLET | Freq: Every day | ORAL | Status: DC
  Administered 2022-03-06 – 2022-03-12 (×7): 2000 [IU] via ORAL
  Filled 2022-03-06 (×7): qty 2

## 2022-03-06 MED ORDER — BISACODYL 5 MG PO TBEC: 5.0000 mg | DELAYED_RELEASE_TABLET | Freq: Every day | ORAL | Status: DC | PRN

## 2022-03-06 MED ORDER — POTASSIUM CHLORIDE CRYS ER 20 MEQ PO TBCR
40.0000 meq | EXTENDED_RELEASE_TABLET | Freq: Once | ORAL | Status: AC
  Administered 2022-03-06: 40 meq via ORAL
  Filled 2022-03-06: qty 2

## 2022-03-06 MED ORDER — WARFARIN SODIUM 4 MG PO TABS
4.0000 mg | ORAL_TABLET | Freq: Once | ORAL | Status: AC
  Administered 2022-03-06: 4 mg via ORAL
  Filled 2022-03-06: qty 1

## 2022-03-06 MED ORDER — ROSUVASTATIN CALCIUM 10 MG PO TABS
5.0000 mg | ORAL_TABLET | ORAL | Status: DC
  Administered 2022-03-07 – 2022-03-11 (×2): 5 mg via ORAL
  Filled 2022-03-06 (×2): qty 1

## 2022-03-06 MED ORDER — FUROSEMIDE 40 MG PO TABS
40.0000 mg | ORAL_TABLET | Freq: Every day | ORAL | Status: DC
  Administered 2022-03-06 – 2022-03-12 (×7): 40 mg via ORAL
  Filled 2022-03-06 (×7): qty 1

## 2022-03-06 MED ORDER — SODIUM CHLORIDE 0.9 % IV SOLN
2.0000 g | Freq: Two times a day (BID) | INTRAVENOUS | Status: DC
  Administered 2022-03-06 – 2022-03-07 (×4): 2 g via INTRAVENOUS
  Filled 2022-03-06 (×5): qty 12.5

## 2022-03-06 NOTE — Consult Note (Addendum)
ANTICOAGULATION CONSULT NOTE - Consult ? ?Pharmacy Consult for warfarin management ?Indication: atrial fibrillation ? ?Allergies  ?Allergen Reactions  ? Prednisone Palpitations  ?  A. fib  ? Pulmicort [Budesonide] Shortness Of Breath, Itching, Swelling and Other (See Comments)  ?  Felt as if things were crawly on her  ? Clonidine Other (See Comments)  ?  Pt felt like things were crawling on her arms.  ? Lotemax [Loteprednol Etabonate]   ?  Broke out around the eye  ? Morphine And Related Itching and Other (See Comments)  ?  "creepy crawly feeling"  ? Trovan [Alatrofloxacin Mesylate] Other (See Comments)  ?  "effected the whole system"  ? Zelnorm [Tegaserod Maleate] Itching  ?  Felt as if things were crawly on her ?  ? Erythromycin Rash  ? ? ?Patient Measurements: ?Height: '5\' 6"'$  (167.6 cm) ?Weight: 79.4 kg (175 lb) ?IBW/kg (Calculated) : 59.3 ?Heparin Dosing Weight:  ? ?Vital Signs: ?Temp: 98.4 ?F (36.9 ?C) (05/03 0749) ?Temp Source: Oral (05/03 0749) ?BP: 131/52 (05/03 0749) ?Pulse Rate: 79 (05/03 0749) ? ?Labs: ?Recent Labs  ?  03/04/22 ?2128 03/05/22 ?0424 03/06/22 ?0600  ?HGB 12.7 11.8* 11.0*  ?HCT 38.4 35.1* 31.5*  ?PLT 208 195 192  ?LABPROT 29.0* 32.6* 29.9*  ?INR 2.8* 3.2* 2.9*  ?CREATININE 2.20* 1.85* 0.94  ? ? ?Estimated Creatinine Clearance: 49.9 mL/min (by C-G formula based on SCr of 0.94 mg/dL). ? ? ?Medications:  ?VKA PTA was taking '5mg'$  on Mon/Fri  & '4mg'$  All other days of the week. ? ?DDIs:  ?amio - pt has been on chronically at stable dose. No concerns/adjustments needed at this time. ?Antibiotics (Vanc/CFP) - likely to increase INR. Will need to adjust per result and watch time of discontinuation. ?Tylenol if >2g/day can increase INR - current utilization not concerning ? ?Assessment: ?81yo female HFpEF, A-fib (on amiodarone and Coumadin s/p ablation April 2022 and multiple cardioversions most recently 02/2022), & (s/p resection of melanoma right breast on 02/28/22) presenting to ED with increased pain,  swelling and redness to the right breast in spite of doxycycline started a few days prior. ?   ?Goal of Therapy:  ?INR 2-3 ?Monitor platelets by anticoagulation protocol: Yes ? ?Date INR Dose ?5.1 2.8 '5mg'$  -pt reported ?5/2 3.2 '3mg'$  ?5/3 2.9 '4mg'$  ? ?Plan:  ?INR 3.2 >2.9. Give warfarin PO '4mg'$  x1  ?CTM daily INR and adjust as needed. ?Weekly CBC ? ?Darrick Penna ?03/06/2022,8:28 AM ? ? ?

## 2022-03-06 NOTE — Evaluation (Signed)
Physical Therapy Evaluation ?Patient Details ?Name: Carol [name] ?MRN: [id number] ?DOB: July 22, 1941 ?Today's Date: 03/06/2022 ? ?History of Present Illness ? presented to ER secondary to redness, pain, edema to R chest/breast (s/p melanoma resection); admitted for management of sepsis related to R breast/chest cellulitis.  ?Clinical Impression ? Patient resting in bed upon arrival to session; alert and oriented to basic information.  Follows commands and eager for OOB efforts.  Endorses continued soreness and sensitivity to R chest/breast (FACES 4-6/10); significant erythema noted to area.  R shoulder elevation, all planes, generally limited by pain; otherwise, UE//LE strength and ROM grossly WFL for basic transfers and gait .  Able to complete bed mobility with mod indep; sit/stand, basic transfers and gait (31') with RW, cga/min assist for safety.  Slight unsteadiness with gait efforts; do recommend continued use f RW for optimal safety/indep.  ?Would benefit from skilled PT to address above deficits and promote optimal return to PLOF.; Recommend transition to HHPT upon discharge from acute hospitalization. ?   ?   ? ?Recommendations for follow up therapy are one component of a multi-disciplinary discharge planning process, led by the attending physician.  Recommendations may be updated based on patient status, additional functional criteria and insurance authorization. ? ?Follow Up Recommendations Home health PT ? ?  ?Assistance Recommended at Discharge PRN  ?Patient can return home with the following ? A little help with walking and/or transfers;A little help with bathing/dressing/bathroom ? ?  ?Equipment Recommendations Rolling walker (2 wheels)  ?Recommendations for Other Services ?    ?  ?Functional Status Assessment Patient has had a recent decline in their functional status and demonstrates the ability to make significant improvements in function in a reasonable and predictable amount of time.  ? ?   ?Precautions / Restrictions Precautions ?Precautions: Fall ?Precaution Comments: R chest/breast incision ?Restrictions ?Weight Bearing Restrictions: No  ? ?  ? ?Mobility ? Bed Mobility ?Overal bed mobility: Modified Independent ?  ?  ?  ?  ?  ?  ?  ?  ? ?Transfers ?Overall transfer level: Needs assistance ?Equipment used: Rolling walker (2 wheels) ?Transfers: Sit to/from Stand ?Sit to Stand: Min assist ?  ?  ?  ?  ?  ?General transfer comment: cuing for hand placement to prevent pulling on RW ?  ? ?Ambulation/Gait ?Ambulation/Gait assistance: Min assist ?Gait Distance (Feet): 35 Feet ?Assistive device: Rolling walker (2 wheels) ?  ?  ?  ?  ?General Gait Details: reciprocal stepping, fair step height/lenght; min cuing to maintain RW in contact with floor.  Anticipate progression in gait distance next session ? ?Stairs ?  ?  ?  ?  ?  ? ?Wheelchair Mobility ?  ? ?Modified Rankin (Stroke Patients Only) ?  ? ?  ? ?Balance Overall balance assessment: Needs assistance ?Sitting-balance support: No upper extremity supported, Feet supported ?Sitting balance-Leahy Scale: Good ?  ?  ?Standing balance support: Bilateral upper extremity supported ?Standing balance-Leahy Scale: Fair ?  ?  ?  ?  ?  ?  ?  ?  ?  ?  ?  ?  ?   ? ? ? ?Pertinent Vitals/Pain Pain Assessment ?Pain Assessment: Faces ?Faces Pain Scale: Hurts little more ?Pain Location: R chest/breast ?Pain Descriptors / Indicators: Grimacing, Aching ?Pain Intervention(s): Limited activity within patient's tolerance, Monitored during session, Repositioned  ? ? ?Home Living Family/patient expects to be discharged to:: Private residence ?Living Arrangements: Spouse/significant other ?Available Help at Discharge: Family ?Type of Home: House ?Home Access:  Stairs to enter ?  ?Entrance Stairs-Number of Steps: 2 (deep enough to use/place RW on each step) ?  ?Home Layout: One level ?Home Equipment: None ?   ?  ?Prior Function Prior Level of Function : Independent/Modified  Independent ?  ?  ?  ?  ?  ?  ?Mobility Comments: Indep with ADLs, household and community mobilization without assist device ?  ?  ? ? ?Hand Dominance  ?   ? ?  ?Extremity/Trunk Assessment  ? Upper Extremity Assessment ?Upper Extremity Assessment: Overall WFL for tasks assessed (R shoulder movement limited approx 25% of normal ROM due to post-op pain and swelling) ?  ? ?Lower Extremity Assessment ?Lower Extremity Assessment: Overall WFL for tasks assessed (grossly at least 4+/5 throughout) ?  ? ?   ?Communication  ? Communication: No difficulties  ?Cognition Arousal/Alertness: Awake/alert ?Behavior During Therapy: [facility] for tasks assessed/performed ?Overall Cognitive Status: Within Functional Limits for tasks assessed ?  ?  ?  ?  ?  ?  ?  ?  ?  ?  ?  ?  ?  ?  ?  ?  ?  ?  ?  ? ?  ?General Comments   ? ?  ?Exercises    ? ?Assessment/Plan  ?  ?PT Assessment Patient needs continued PT services  ?PT Problem List Decreased strength;Decreased range of motion;Decreased activity tolerance;Decreased balance;Decreased mobility;Decreased knowledge of use of DME;Decreased safety awareness;Decreased knowledge of precautions ? ?   ?  ?PT Treatment Interventions DME instruction;Gait training;Stair training;Functional mobility training;Therapeutic activities;Therapeutic exercise;Balance training;Patient/family education   ? ?PT Goals (Current goals can be found in the Care Plan section)  ?Acute Rehab PT Goals ?Patient Stated Goal: to return home; to get up and move around ?PT Goal Formulation: With patient ?Time For Goal Achievement: 03/20/22 ?Potential to Achieve Goals: Good ? ?  ?Frequency Min 2X/week ?  ? ? ?Co-evaluation   ?  ?  ?  ?  ? ? ?  ?AM-PAC PT "6 Clicks" Mobility  ?Outcome Measure Help needed turning from your back to your side while in a flat bed without using bedrails?: None ?Help needed moving from lying on your back to sitting on the side of a flat bed without using bedrails?: None ?Help needed moving to and from a  bed to a chair (including a wheelchair)?: A Little ?Help needed standing up from a chair using your arms (e.g., wheelchair or bedside chair)?: A Little ?Help needed to walk in hospital room?: A Little ?Help needed climbing 3-5 steps with a railing? : A Little ?6 Click Score: 20 ? ?  ?End of Session Equipment Utilized During Treatment: Gait belt ?Activity Tolerance: Patient tolerated treatment well ?Patient left: in chair;with call bell/phone within reach;with chair alarm set;with nursing/sitter in room ?Nurse Communication: Mobility status ?PT Visit Diagnosis: Muscle weakness (generalized) (M62.81);Difficulty in walking, not elsewhere classified (R26.2) ?  ? ?Time: 1610-9604 ?PT Time Calculation (min) (ACUTE ONLY): 20 min ? ? ?Charges:   PT Evaluation ?$PT Eval Moderate Complexity: 1 Mod ?  ?  ?   ?Kaitlynne Wenz H. Owens Shark, PT, DPT, NCS ?03/06/22, 10:33 PM ?[id number] ? ? ?

## 2022-03-06 NOTE — Progress Notes (Signed)
?PROGRESS NOTE ? ? ? ?Carol [name]  EGB:[id number] DOB: [birth year] DOA: 03/04/2022 ?PCP: Crecencio Mc, MD  ? ? ?Brief Narrative:  ?81 y.o. female with medical history significant for HFpEF, A-fib on amiodarone and Coumadin s/p ablation April 2022 and multiple cardioversions most recently 02/2022, who is s/p resection of melanoma right breast on 02/2722 who presents to the ED with increased pain, swelling and redness to the right breast in spite of doxycycline started a few days prior.  Patient also complains of generalized malaise and congestion and tested positive for COVID 5 days ago but states symptoms of congestion have been improving. ? ?Cellulitic changes have been improving ? ?Assessment & Plan: ?  ?Principal Problem: ?  Severe sepsis ([place]) ?Active Problems: ?  Surgical wound infection ?  AKI (acute kidney injury) with anion gap metabolic acidosis ([place]) ?  Hyponatremia ?  Atrial fibrillation s/p ablation 2022, s/p cardioversion 02/2022 [facility]) ?  Warfarin anticoagulation ?  COVID-19 virus infection ?  (HFpEF) heart failure with preserved ejection fraction ([place]) ? ?* Severe sepsis ([place]) ?--fever, leukocytosis and elevated lactic acid, source is cellulitis ?  ?Surgical wound infection ?Patient is s/p resection of melanoma on 4/27 and reports increased drainage ?--CT chest showed Diffuse subcutaneous fat stranding throughout the right anterior chest wall and right breast, consistent with cellulitis. No evidence of fluid collection or abscess on this unenhanced exam. ?--started on vanc and cefepime on presentation ?Plan: ?--cont vanc and cefepime for now ?-- Can likely de-escalate to oral agents within the next 1 to 2 days ?  ?AKI (acute kidney injury) with anion gap metabolic acidosis ([place]) ?Suspect prerenal and related to sepsis ?Continue IV hydration for today ?  ?Hyponatremia ?Suspecting hypovolemic ?Improved with IVF. ?  ?Atrial fibrillation s/p ablation 2022, s/p cardioversion 02/2022  [facility]) ?--currently rate controlled ?Plan: ?--Continue amiodarone and warfarin ?--resume home cardizem ?--hold home Toprol for now due to soft BP, can likely start in 24 hours ?  ?Warfarin anticoagulation ?Pharmacy to adjust warfarin for goal INR 2-3 ?  ?(HFpEF) heart failure with preserved ejection fraction ([place]) ?Compensated ?-- Restart home Lasix ?  ?COVID-19 virus infection ?Asymptomatic, no hypoxia ?Continue molnupiravir from 5/1 to 5/6 ?  ? ? ?DVT prophylaxis: Warfarin ?Code Status: Full ?Family Communication: Spouse Leea Rambeau [phone] on 5/3 ?Disposition Plan: Status is: Inpatient ?Remains inpatient appropriate because: Sepsis secondary to cellulitis.  Improving.  Discharge 1 to 2 days. ? ? ?Level of care: Telemetry Medical ? ?Consultants:  ?None ? ?Procedures:  ?None ? ?Antimicrobials: ?Vancomycin ?Cefepime ? ? ?Subjective: ?Seen and examined.  Resting comfortably in bed.  No visible distress.  Cellulitic change on chest is improving. ? ?Objective: ?Vitals:  ? 03/05/22 2032 03/06/22 0009 03/06/22 0606 03/06/22 0749  ?BP: 121/64 (!) 121/51 130/61 (!) 131/52  ?Pulse: 81 83 80 79  ?Resp: '18 20 18 18  '$ ?Temp: (!) 100.5 ?F (38.1 ?C) 99.5 ?F (37.5 ?C) 99 ?F (37.2 ?C) 98.4 ?F (36.9 ?C)  ?TempSrc: Oral Oral Oral Oral  ?SpO2: 96% 100% 96% 96%  ?Weight:      ?Height:      ? ? ?Intake/Output Summary (Last 24 hours) at 03/06/2022 1526 ?Last data filed at 03/06/2022 1129 ?Gross per 24 hour  ?Intake 250 ml  ?Output 2000 ml  ?Net -1750 ml  ? ?Filed Weights  ? 03/04/22 2118  ?Weight: 79.4 kg  ? ? ?Examination: ? ?General exam: No acute distress ?Respiratory system: Lungs clear.  Normal work of breathing.  Room  air ?Cardiovascular system: S1-S2, RRR, no murmurs, no pedal edema ?Gastrointestinal system: Soft, NT/ND, normal bowel sounds ?Central nervous system: Alert and oriented. No focal neurological deficits. ?Extremities: Symmetric 5 x 5 power. ?Skin: Extensive cellulitic changes on the chest, greater on the right side,  erythematous, warm, tender to touch ?Psychiatry: Judgement and insight appear normal. Mood & affect appropriate.  ? ? ? ?Data Reviewed: I have personally reviewed following labs and imaging studies ? ?CBC: ?Recent Labs  ?Lab 03/04/22 ?2128 03/05/22 ?0424 03/06/22 ?0600  ?WBC 20.0* 17.1* 13.5*  ?HGB 12.7 11.8* 11.0*  ?HCT 38.4 35.1* 31.5*  ?MCV 96.5 94.4 92.4  ?PLT 208 195 192  ? ?Basic Metabolic Panel: ?Recent Labs  ?Lab 03/04/22 ?2128 03/05/22 ?0424 03/06/22 ?0600  ?NA 121* 126* 134*  ?K 4.7 4.4 3.1*  ?CL 87* 94* 105  ?CO2 17* 22 22  ?GLUCOSE 109* 107* 113*  ?BUN 37* 35* 21  ?CREATININE 2.20* 1.85* 0.94  ?CALCIUM 8.2* 8.6* 8.2*  ?MG  --   --  2.0  ? ?GFR: ?Estimated Creatinine Clearance: 49.9 mL/min (by C-G formula based on SCr of 0.94 mg/dL). ?Liver Function Tests: ?No results for input(s): AST, ALT, ALKPHOS, BILITOT, PROT, ALBUMIN in the last 168 hours. ?No results for input(s): LIPASE, AMYLASE in the last 168 hours. ?No results for input(s): AMMONIA in the last 168 hours. ?Coagulation Profile: ?Recent Labs  ?Lab 03/04/22 ?2128 03/05/22 ?0424 03/06/22 ?0600  ?INR 2.8* 3.2* 2.9*  ? ?Cardiac Enzymes: ?No results for input(s): CKTOTAL, CKMB, CKMBINDEX, TROPONINI in the last 168 hours. ?BNP (last 3 results) ?Recent Labs  ?  10/15/21 ?1025 10/23/21 ?0849  ?PROBNP 1,651.0* 649.0*  ? ?HbA1C: ?No results for input(s): HGBA1C in the last 72 hours. ?CBG: ?No results for input(s): GLUCAP in the last 168 hours. ?Lipid Profile: ?No results for input(s): CHOL, HDL, LDLCALC, TRIG, CHOLHDL, LDLDIRECT in the last 72 hours. ?Thyroid Function Tests: ?No results for input(s): TSH, T4TOTAL, FREET4, T3FREE, THYROIDAB in the last 72 hours. ?Anemia Panel: ?No results for input(s): VITAMINB12, FOLATE, FERRITIN, TIBC, IRON, RETICCTPCT in the last 72 hours. ?Sepsis Labs: ?Recent Labs  ?Lab 03/04/22 ?2128 03/04/22 ?2238 03/05/22 ?0424  ?PROCALCITON  --   --  2.32  ?LATICACIDVEN 2.6* 1.5  --   ? ? ?Recent Results (from the past 240 hour(s))   ?Blood culture (routine x 2)     Status: None (Preliminary result)  ? Collection Time: 03/04/22 10:38 PM  ? Specimen: BLOOD  ?Result Value Ref Range Status  ? Specimen Description BLOOD LEFT ASSIST CONTROL  Final  ? Special Requests   Final  ?  BOTTLES DRAWN AEROBIC AND ANAEROBIC Blood Culture adequate volume  ? Culture   Final  ?  NO GROWTH 2 DAYS ?Performed at [facility], [address], [place], [place] [address] ?  ? Report Status PENDING  Incomplete  ?Blood culture (routine x 2)     Status: None (Preliminary result)  ? Collection Time: 03/04/22 10:38 PM  ? Specimen: BLOOD  ?Result Value Ref Range Status  ? Specimen Description BLOOD RIGHT HAND  Final  ? Special Requests   Final  ?  BOTTLES DRAWN AEROBIC AND ANAEROBIC Blood Culture adequate volume  ? Culture   Final  ?  NO GROWTH 2 DAYS ?Performed at [facility], [address] [place], [place] [address] ?  ? Report Status PENDING  Incomplete  ?  ? ? ? ? ? ?Radiology Studies: ?DG Chest 2 View ? ?Result Date:  03/04/2022 ?CLINICAL DATA:  Shortness of breath, history of recent melanoma removal from the right breast EXAM: CHEST - 2 VIEW COMPARISON:  10/23/2021 FINDINGS: Cardiac shadow is enlarged but stable. Right chest wall port is seen in satisfactory position. The lungs are well aerated bilaterally. Elevation of the right hemidiaphragm is seen. No acute bony abnormality is noted. IMPRESSION: No acute abnormality noted. Electronically Signed   By: Inez Catalina M.D.   On: 03/04/2022 22:01  ? ?CT Chest Wo Contrast ? ?Result Date: 03/04/2022 ?CLINICAL DATA:  Melanoma removal from right breast 5 days ago, pain and swelling at surgical site, erythema EXAM: CT CHEST WITHOUT CONTRAST TECHNIQUE: Multidetector CT imaging of the chest was performed following the standard protocol without IV contrast. RADIATION DOSE REDUCTION: This exam was performed according to the departmental dose-optimization program which includes automated exposure control,  adjustment of the mA and/or kV according to patient size and/or use of iterative reconstruction technique. COMPARISON:  03/04/2022, 05/06/2012 FINDINGS: Cardiovascular: Limited unenhanced imaging of the heart and great ves

## 2022-03-06 NOTE — Progress Notes (Signed)
Pharmacy Antibiotic Note ? ?Carol [name] is a 81 y.o. female admitted on 03/04/2022 with cellulitis.  Pharmacy has been consulted for Vancomycin dosing x 7 days. ? ?Plan: Scr 2.2 > 1.85 > 0.94 ?Adjust maintenance dose from 1g q36 to '1250mg'$  q24 ?Goal AUC 400-550. ?Expected AUC: 535 ?SCr used: 0.94 ; Vd 0.72; IBW ? ?Adjust Cefepime 2 gm q24h to q12 per indication and renal fxn. ? ?Height: '5\' 6"'$  (167.6 cm) ?Weight: 79.4 kg (175 lb) ?IBW/kg (Calculated) : 59.3 ? ?Temp (24hrs), Avg:99.6 ?F (37.6 ?C), Min:98.4 ?F (36.9 ?C), Max:100.6 ?F (38.1 ?C) ? ?Recent Labs  ?Lab 03/04/22 ?2128 03/04/22 ?2238 03/05/22 ?0424 03/06/22 ?0600  ?WBC 20.0*  --  17.1* 13.5*  ?CREATININE 2.20*  --  1.85* 0.94  ?LATICACIDVEN 2.6* 1.5  --   --   ? ?  ?Estimated Creatinine Clearance: 49.9 mL/min (by C-G formula based on SCr of 0.94 mg/dL).   ? ?Allergies  ?Allergen Reactions  ? Prednisone Palpitations  ?  A. fib  ? Pulmicort [Budesonide] Shortness Of Breath, Itching, Swelling and Other (See Comments)  ?  Felt as if things were crawly on her  ? Clonidine Other (See Comments)  ?  Pt felt like things were crawling on her arms.  ? Lotemax [Loteprednol Etabonate]   ?  Broke out around the eye  ? Morphine And Related Itching and Other (See Comments)  ?  "creepy crawly feeling"  ? Trovan [Alatrofloxacin Mesylate] Other (See Comments)  ?  "effected the whole system"  ? Zelnorm [Tegaserod Maleate] Itching  ?  Felt as if things were crawly on her ?  ? Erythromycin Rash  ? ? ?Antimicrobials this admission: ?5/01 Ceftriaxone >> x 1 dose ?5/01 Vancomycin >> x 7 days ?5/02 Cefepime >>  ? ?Microbiology results: ?5/01 BCx: Pending ? ? ?Thank you for allowing pharmacy to be a part of this pleasant patient?s care. ? ?Darrick Penna, PharmD, MS PGPM ?Clinical Pharmacist ?03/06/2022 ?9:04 AM ? ? ? ?

## 2022-03-07 DIAGNOSIS — R6520 Severe sepsis without septic shock: Secondary | ICD-10-CM | POA: Diagnosis not present

## 2022-03-07 DIAGNOSIS — A419 Sepsis, unspecified organism: Secondary | ICD-10-CM | POA: Diagnosis not present

## 2022-03-07 LAB — CREATININE, SERUM
Creatinine, Ser: 0.73 mg/dL (ref 0.44–1.00)
GFR, Estimated: >60 mL/min (ref >60)

## 2022-03-07 LAB — PROTIME-INR
INR: 2.6 — ABNORMAL HIGH (ref 0.8–1.2)
Prothrombin Time: 27.6 seconds — ABNORMAL HIGH (ref 11.4–15.2)

## 2022-03-07 LAB — MAGNESIUM: Magnesium: 1.8 mg/dL (ref 1.7–2.4)

## 2022-03-07 MED ORDER — METOPROLOL SUCCINATE ER 50 MG PO TB24
100.0000 mg | ORAL_TABLET | Freq: Every day | ORAL | Status: DC
  Administered 2022-03-07 – 2022-03-12 (×6): 100 mg via ORAL
  Filled 2022-03-07 (×6): qty 2

## 2022-03-07 MED ORDER — WARFARIN SODIUM 4 MG PO TABS
4.0000 mg | ORAL_TABLET | Freq: Once | ORAL | Status: AC
  Administered 2022-03-07: 4 mg via ORAL
  Filled 2022-03-07: qty 1

## 2022-03-07 NOTE — Progress Notes (Signed)
Patient ID: Carol [name], female   DOB: [birth year], 81 y.o.   MRN: [id number] ?MD notified. ABD pad loosely placed. Will continue to monitor. ? ?Haydee Salter, RN ? ?

## 2022-03-07 NOTE — Consult Note (Signed)
[facility] CM Inpatient Consult ? ? ?03/07/2022 ? ?Carol [name] ?02/08/1941 ?[id number] ? ?[facility] CM) ?  ?Patient chart reviewed with noted high risk score for unplanned readmission. Assessed for post hospital chronic disease management and care coordination needs. ? ?Patient's primary provider office offers an embedded chronic care management program and team.  ? ?Plan: Will continue to follow for progression and disposition plans. ? ?Of note, [facility] Care Management services does not replace or interfere with any services that are arranged by inpatient case management or social work.  ? ?Netta Cedars, MSN, RN ?[facility] Liaison ?Toll free office [phone] ?

## 2022-03-07 NOTE — Consult Note (Signed)
ANTICOAGULATION CONSULT NOTE - Consult ? ?Pharmacy Consult for warfarin management ?Indication: atrial fibrillation ? ?Allergies  ?Allergen Reactions  ? Prednisone Palpitations  ?  A. fib  ? Pulmicort [Budesonide] Shortness Of Breath, Itching, Swelling and Other (See Comments)  ?  Felt as if things were crawly on her  ? Clonidine Other (See Comments)  ?  Pt felt like things were crawling on her arms.  ? Lotemax [Loteprednol Etabonate]   ?  Broke out around the eye  ? Morphine And Related Itching and Other (See Comments)  ?  "creepy crawly feeling"  ? Trovan [Alatrofloxacin Mesylate] Other (See Comments)  ?  "effected the whole system"  ? Zelnorm [Tegaserod Maleate] Itching  ?  Felt as if things were crawly on her ?  ? Erythromycin Rash  ? ? ?Patient Measurements: ?Height: '5\' 6"'$  (167.6 cm) ?Weight: 79.4 kg (175 lb) ?IBW/kg (Calculated) : 59.3 ?Heparin Dosing Weight:  ? ?Vital Signs: ?Temp: 98.6 ?F (37 ?C) (05/04 7062) ?Temp Source: Oral (05/04 0041) ?BP: 102/74 (05/04 0412) ?Pulse Rate: 82 (05/04 0412) ? ?Labs: ?Recent Labs  ?  03/04/22 ?2128 03/05/22 ?0424 03/06/22 ?0600 03/07/22 ?0425  ?HGB 12.7 11.8* 11.0*  --   ?HCT 38.4 35.1* 31.5*  --   ?PLT 208 195 192  --   ?LABPROT 29.0* 32.6* 29.9* 27.6*  ?INR 2.8* 3.2* 2.9* 2.6*  ?CREATININE 2.20* 1.85* 0.94 0.73  ? ? ? ?Estimated Creatinine Clearance: 58.6 mL/min (by C-G formula based on SCr of 0.73 mg/dL). ? ? ?Medications:  ?VKA PTA was taking '5mg'$  on Mon/Fri  & '4mg'$  All other days of the week ? ? ?Assessment: ?81yo female HFpEF, A-fib (on amiodarone and Coumadin s/p ablation April 2022 and multiple cardioversions most recently 02/2022), & (s/p resection of melanoma right breast on 02/28/22) presenting to ED with increased pain, swelling and redness to the right breast in spite of doxycycline started a few days prior. ?   ?Goal of Therapy:  ?INR 2-3 ?Monitor platelets by anticoagulation protocol: Yes ? ?Date INR Dose ?5.1 2.8 '5mg'$  -pt  reported ?5/2 3.2 '3mg'$  ?5/3 2.9 '4mg'$  ?5/4 2.6  ? ?Plan:  ?INR is therapeutic. Will give warfarin PO '4mg'$  tonight. ?Daily INR ?Weekly CBC ? ? ?Sherilyn Banker, PharmD ?Clinical Pharmacist  ?03/07/2022 7:24 AM  ? ? ? ?

## 2022-03-07 NOTE — Progress Notes (Signed)
?PROGRESS NOTE ? ? ? ?Carol [name]  ZCH:[id number] DOB: October 24, 1941 DOA: 03/04/2022 ?PCP: Carol Mc, MD  ? ? ?Brief Narrative:  ?81 y.o. female with medical history significant for HFpEF, A-fib on amiodarone and Coumadin s/p ablation April 2022 and multiple cardioversions most recently 02/2022, who is s/p resection of melanoma right breast on 02/2722 who presents to the ED with increased pain, swelling and redness to the right breast in spite of doxycycline started a few days prior.  Patient also complains of generalized malaise and congestion and tested positive for COVID 5 days ago but states symptoms of congestion have been improving. ? ?Cellulitic changes have been improving.  On 5/4 patient had some drainage from surgical site.  Sutures intact, no evidence for dehiscence ? ?Assessment & Plan: ?  ?Principal Problem: ?  Severe sepsis ([place]) ?Active Problems: ?  Surgical wound infection ?  AKI (acute kidney injury) with anion gap metabolic acidosis ([place]) ?  Hyponatremia ?  Atrial fibrillation s/p ablation 2022, s/p cardioversion 02/2022 [facility]) ?  Warfarin anticoagulation ?  COVID-19 virus infection ?  (HFpEF) heart failure with preserved ejection fraction ([place]) ? ?* Severe sepsis ([place]) ?--fever, leukocytosis and elevated lactic acid, source is cellulitis ?  ?Surgical wound infection ?Patient is s/p resection of melanoma on 4/27 and reports increased drainage ?--CT chest showed Diffuse subcutaneous fat stranding throughout the right anterior chest wall and right breast, consistent with cellulitis. No evidence of fluid collection or abscess on this unenhanced exam. ?--started on vanc and cefepime on presentation ?--Drainage from surgical site noted 5/4 ?Plan: ?--cont vanc and cefepime for now ?--Consider de-escalation ?--Culture wound ?  ?AKI (acute kidney injury) with anion gap metabolic acidosis ([place]) ?Suspect prerenal and related to sepsis ?No further IV fluids ?  ?Hyponatremia ?Suspecting  hypovolemic ?Improved with IVF. ?IVF stopped ?  ?Atrial fibrillation s/p ablation 2022, s/p cardioversion 02/2022 [facility]) ?--currently rate controlled ?Plan: ?--Continue amiodarone and warfarin ?--resume home cardizem ?-- Resume home Toprol-XL 5/4 ?  ?Warfarin anticoagulation ?Pharmacy to adjust warfarin for goal INR 2-3 ?  ?(HFpEF) heart failure with preserved ejection fraction ([place]) ?Compensated ?-- Continue home Lasix ?  ?COVID-19 virus infection ?Asymptomatic, no hypoxia ?Continue molnupiravir from 5/1 to 5/6 ?  ? ? ?DVT prophylaxis: Warfarin ?Code Status: Full ?Family Communication: Spouse Carol [name] [phone] on 5/3 ?Disposition Plan: Status is: Inpatient ?Remains inpatient appropriate because: Sepsis secondary to cellulitis.  Improving.  Discharge 1 to 2 days. ? ? ?Level of care: Telemetry Medical ? ?Consultants:  ?None ? ?Procedures:  ?None ? ?Antimicrobials: ?Vancomycin ?Cefepime ? ? ?Subjective: ?Seen and examined.  Resting comfortably in bed.  No visible distress.  Cellulitic change on chest is improving. ? ?Objective: ?Vitals:  ? 03/07/22 0041 03/07/22 0400 03/07/22 0412 03/07/22 0755  ?BP: 140/73 (!) 160/71 102/74 (!) 145/77  ?Pulse: 88 94 82 89  ?Resp: '18 16 16 17  '$ ?Temp: 98.7 ?F (37.1 ?C) 99.2 ?F (37.3 ?C) 98.6 ?F (37 ?C) 98 ?F (36.7 ?C)  ?TempSrc: Oral     ?SpO2: 99% 98% 100% 97%  ?Weight:      ?Height:      ? ? ?Intake/Output Summary (Last 24 hours) at 03/07/2022 1045 ?Last data filed at 03/07/2022 0300 ?Gross per 24 hour  ?Intake 450.09 ml  ?Output 400 ml  ?Net 50.09 ml  ? ?Filed Weights  ? 03/04/22 2118  ?Weight: 79.4 kg  ? ? ?Examination: ? ?General exam: NAD ?Respiratory system: Lungs clear.  Normal work of breathing.  Room air ?Cardiovascular system: S1-S2, RRR, no murmurs, no pedal edema ?Gastrointestinal system: Soft, NT/ND, normal bowel sounds ?Central nervous system: Alert and oriented. No focal neurological deficits. ?Extremities: Symmetric 5 x 5 power. ?Skin: Extensive cellulitic changes  on the chest, greater on the right side, erythematous, warm, tender to touch.  Surgical site with serosanguineous drainage.  No wound dehiscence ?Psychiatry: Judgement and insight appear normal. Mood & affect appropriate.  ? ? ? ?Data Reviewed: I have personally reviewed following labs and imaging studies ? ?CBC: ?Recent Labs  ?Lab 03/04/22 ?2128 03/05/22 ?0424 03/06/22 ?0600  ?WBC 20.0* 17.1* 13.5*  ?HGB 12.7 11.8* 11.0*  ?HCT 38.4 35.1* 31.5*  ?MCV 96.5 94.4 92.4  ?PLT 208 195 192  ? ?Basic Metabolic Panel: ?Recent Labs  ?Lab 03/04/22 ?2128 03/05/22 ?0424 03/06/22 ?0600 03/07/22 ?0425  ?NA 121* 126* 134*  --   ?K 4.7 4.4 3.1*  --   ?CL 87* 94* 105  --   ?CO2 17* 22 22  --   ?GLUCOSE 109* 107* 113*  --   ?BUN 37* 35* 21  --   ?CREATININE 2.20* 1.85* 0.94 0.73  ?CALCIUM 8.2* 8.6* 8.2*  --   ?MG  --   --  2.0 1.8  ? ?GFR: ?Estimated Creatinine Clearance: 58.6 mL/min (by C-G formula based on SCr of 0.73 mg/dL). ?Liver Function Tests: ?No results for input(s): AST, ALT, ALKPHOS, BILITOT, PROT, ALBUMIN in the last 168 hours. ?No results for input(s): LIPASE, AMYLASE in the last 168 hours. ?No results for input(s): AMMONIA in the last 168 hours. ?Coagulation Profile: ?Recent Labs  ?Lab 03/04/22 ?2128 03/05/22 ?0424 03/06/22 ?0600 03/07/22 ?0425  ?INR 2.8* 3.2* 2.9* 2.6*  ? ?Cardiac Enzymes: ?No results for input(s): CKTOTAL, CKMB, CKMBINDEX, TROPONINI in the last 168 hours. ?BNP (last 3 results) ?Recent Labs  ?  10/15/21 ?1025 10/23/21 ?0849  ?PROBNP 1,651.0* 649.0*  ? ?HbA1C: ?No results for input(s): HGBA1C in the last 72 hours. ?CBG: ?No results for input(s): GLUCAP in the last 168 hours. ?Lipid Profile: ?No results for input(s): CHOL, HDL, LDLCALC, TRIG, CHOLHDL, LDLDIRECT in the last 72 hours. ?Thyroid Function Tests: ?No results for input(s): TSH, T4TOTAL, FREET4, T3FREE, THYROIDAB in the last 72 hours. ?Anemia Panel: ?No results for input(s): VITAMINB12, FOLATE, FERRITIN, TIBC, IRON, RETICCTPCT in the last 72  hours. ?Sepsis Labs: ?Recent Labs  ?Lab 03/04/22 ?2128 03/04/22 ?2238 03/05/22 ?0424  ?PROCALCITON  --   --  2.32  ?LATICACIDVEN 2.6* 1.5  --   ? ? ?Recent Results (from the past 240 hour(s))  ?Blood culture (routine x 2)     Status: None (Preliminary result)  ? Collection Time: 03/04/22 10:38 PM  ? Specimen: BLOOD  ?Result Value Ref Range Status  ? Specimen Description BLOOD LEFT ASSIST CONTROL  Final  ? Special Requests   Final  ?  BOTTLES DRAWN AEROBIC AND ANAEROBIC Blood Culture adequate volume  ? Culture   Final  ?  NO GROWTH 3 DAYS ?Performed at [facility], [address], [place], [place] [address] ?  ? Report Status PENDING  Incomplete  ?Blood culture (routine x 2)     Status: None (Preliminary result)  ? Collection Time: 03/04/22 10:38 PM  ? Specimen: BLOOD  ?Result Value Ref Range Status  ? Specimen Description BLOOD RIGHT HAND  Final  ? Special Requests   Final  ?  BOTTLES DRAWN AEROBIC AND ANAEROBIC Blood Culture adequate volume  ? Culture   Final  ?  NO GROWTH  3 DAYS ?Performed at [facility], [address] [place], [place] [address] ?  ? Report Status PENDING  Incomplete  ?  ? ? ? ? ? ?Radiology Studies: ?No results found. ? ? ? ? ? ?Scheduled Meds: ? amiodarone  200 mg Oral BID  ? cholecalciferol  2,000 Units Oral Daily  ? diltiazem  180 mg Oral Daily  ? furosemide  40 mg Oral Daily  ? gabapentin  100 mg Oral q morning  ? And  ? gabapentin  200 mg Oral QHS  ? molnupiravir EUA  4 capsule Oral BID  ? pantoprazole  40 mg Oral Daily  ? rosuvastatin  5 mg Oral Once per day on Mon Thu  ? warfarin  4 mg Oral ONCE-1600  ? Warfarin - Pharmacist Dosing Inpatient   Does not apply [id number]  ? ?Continuous Infusions: ? ceFEPime (MAXIPIME) IV 2 g (03/07/22 0848)  ? vancomycin 1,250 mg (03/07/22 0032)  ? ? ? LOS: 2 days  ? ? ? ? ? ?Sidney Ace, MD ?[facility] ? ? ?If 7PM-7AM, please contact night-coverage ? ?03/07/2022, 10:45 AM  ? ?

## 2022-03-07 NOTE — Progress Notes (Signed)
Physical Therapy Treatment ?Patient Details ?Name: Carol [name] ?MRN: [id number] ?DOB: 09/20/1941 ?Today's Date: 03/07/2022 ? ? ?History of Present Illness presented to ER secondary to redness, pain, edema to R chest/breast (s/p melanoma resection); admitted for management of sepsis related to R breast/chest cellulitis. ? ?  ?PT Comments  ? ? Patient is making excellent progress with functional independence and activity progression. The patient ambulated around the room and in hallway around nursing station without assistive device. Mild veering to the left noted with increased ambulation distance that was self corrected. No shortness of breath is noted with functional activity. Good overall dynamic standing balance with functional tasks. Education provided on progressing activity slowly and ambulation for conditioning at home. The patient is not interested in HHPT at this time. Anticipate the patient can return home at discharge with no anticipated PT needs based on improved functional independence this session.  ?  ?Recommendations for follow up therapy are one component of a multi-disciplinary discharge planning process, led by the attending physician.  Recommendations may be updated based on patient status, additional functional criteria and insurance authorization. ? ?Follow Up Recommendations ? No PT follow up ?  ?  ?Assistance Recommended at Discharge PRN  ?Patient can return home with the following A little help with walking and/or transfers;A little help with bathing/dressing/bathroom ?  ?Equipment Recommendations ? Rolling walker (2 wheels)  ?  ?Recommendations for Other Services   ? ? ?  ?Precautions / Restrictions Precautions ?Precautions: Fall ?Restrictions ?Weight Bearing Restrictions: No  ?  ? ?Mobility ? Bed Mobility ?Overal bed mobility: Modified Independent ?  ?  ?  ?  ?  ?  ?General bed mobility comments: increased time required. no physical assistance needed ?  ? ?Transfers ?Overall  transfer level: Modified independent ?  ?Transfers: Sit to/from Stand ?Sit to Stand: Modified independent (Device/Increase time) ?  ?  ?  ?  ?  ?General transfer comment: good safety awareness with functional transfers observed without cueing. patient completed all toileting after urinating without assistance ?  ? ?Ambulation/Gait ?Ambulation/Gait assistance: Modified independent (Device/Increase time), Supervision ?Gait Distance (Feet): 175 Feet ?Assistive device: None ?Gait Pattern/deviations: Step-through pattern (mild veering to left) ?Gait velocity: mildly decreased ?  ?  ?General Gait Details: patient ambulated in hallway without assistive device. she had mild veering to the left towards the end of the walk that is self corrected. periods of supervision with veering, otherwise Mod I for ambulation. no shortness of breath noted after hallway walking. patient educated on routine walking for conditioning at home, progressing activity slowly ? ? ?Stairs ?  ?  ?  ?  ?  ? ? ?Wheelchair Mobility ?  ? ?Modified Rankin (Stroke Patients Only) ?  ? ? ?  ?Balance   ?  ?  ?  ?  ?Standing balance support: No upper extremity supported, During functional activity ?Standing balance-Leahy Scale: Good ?Standing balance comment: patient is able to reach outside base of support with functional mobility, including opening doors, washing hands at sink, functional ambulation with no gross loss of balance. ?  ?  ?  ?  ?  ?  ?  ?  ?  ?  ?  ?  ? ?  ?Cognition Arousal/Alertness: Awake/alert ?Behavior During Therapy: [facility] for tasks assessed/performed ?Overall Cognitive Status: Within Functional Limits for tasks assessed ?  ?  ?  ?  ?  ?  ?  ?  ?  ?  ?  ?  ?  ?  ?  ?  ?  General Comments: patient able to follow all directions without difficulty. she did state that she has periods of "foggy" thinking due to medications at times. ?  ?  ? ?  ?Exercises   ? ?  ?General Comments   ?  ?  ? ?Pertinent Vitals/Pain Pain Assessment ?Pain Assessment:  No/denies pain  ? ? ?Home Living   ?  ?  ?  ?  ?  ?  ?  ?  ?  ?   ?  ?Prior Function    ?  ?  ?   ? ?PT Goals (current goals can now be found in the care plan section) Acute Rehab PT Goals ?Patient Stated Goal: to return home ?PT Goal Formulation: With patient ?Time For Goal Achievement: 03/20/22 ?Potential to Achieve Goals: Good ?Progress towards PT goals: Progressing toward goals ? ?  ?Frequency ? ? ? Min 2X/week ? ? ? ?  ?PT Plan Current plan remains appropriate  ? ? ?Co-evaluation   ?  ?  ?  ?  ? ?  ?AM-PAC PT "6 Clicks" Mobility   ?Outcome Measure ? Help needed turning from your back to your side while in a flat bed without using bedrails?: None ?Help needed moving from lying on your back to sitting on the side of a flat bed without using bedrails?: None ?Help needed moving to and from a bed to a chair (including a wheelchair)?: None ?Help needed standing up from a chair using your arms (e.g., wheelchair or bedside chair)?: None ?Help needed to walk in hospital room?: A Little ?Help needed climbing 3-5 steps with a railing? : A Little ?6 Click Score: 22 ? ?  ?End of Session Equipment Utilized During Treatment:  (droplet mask worn by the patient in hallway) ?Activity Tolerance: Patient tolerated treatment well ?Patient left: in bed;with call bell/phone within reach;with family/visitor present ?  ?PT Visit Diagnosis: Muscle weakness (generalized) (M62.81);Difficulty in walking, not elsewhere classified (R26.2) ?  ? ? ?Time: 2947-6546 ?PT Time Calculation (min) (ACUTE ONLY): 21 min ? ?Charges:  $Gait Training: 8-22 mins          ?          ? ?Minna Merritts, PT, MPT ? ? ? ?Percell Locus ?03/07/2022, 3:08 PM ? ?

## 2022-03-08 DIAGNOSIS — A419 Sepsis, unspecified organism: Secondary | ICD-10-CM | POA: Diagnosis not present

## 2022-03-08 DIAGNOSIS — R6520 Severe sepsis without septic shock: Secondary | ICD-10-CM | POA: Diagnosis not present

## 2022-03-08 LAB — BASIC METABOLIC PANEL
Anion gap: 10 (ref 5–15)
BUN: 17 mg/dL (ref 8–23)
CO2: 25 mmol/L (ref 22–32)
Calcium: 8.3 mg/dL — ABNORMAL LOW (ref 8.9–10.3)
Chloride: 102 mmol/L (ref 98–111)
Creatinine, Ser: 0.65 mg/dL (ref 0.44–1.00)
GFR, Estimated: >60 mL/min (ref >60)
Glucose, Bld: 97 mg/dL (ref 70–99)
Potassium: 3 mmol/L — ABNORMAL LOW (ref 3.5–5.1)
Sodium: 137 mmol/L (ref 135–145)

## 2022-03-08 LAB — CBC WITH DIFFERENTIAL/PLATELET
Abs Immature Granulocytes: 0.43 10*3/uL — ABNORMAL HIGH (ref 0.00–0.07)
Basophils Absolute: 0.1 10*3/uL (ref 0.0–0.1)
Basophils Relative: 1 %
Eosinophils Absolute: 0.2 10*3/uL (ref 0.0–0.5)
Eosinophils Relative: 2 %
HCT: 34.5 % — ABNORMAL LOW (ref 36.0–46.0)
Hemoglobin: 11.9 g/dL — ABNORMAL LOW (ref 12.0–15.0)
Immature Granulocytes: 5 %
Lymphocytes Relative: 19 %
Lymphs Abs: 1.7 10*3/uL (ref 0.7–4.0)
MCH: 31.6 pg (ref 26.0–34.0)
MCHC: 34.5 g/dL (ref 30.0–36.0)
MCV: 91.5 fL (ref 80.0–100.0)
Monocytes Absolute: 1.1 10*3/uL — ABNORMAL HIGH (ref 0.1–1.0)
Monocytes Relative: 13 %
Neutro Abs: 5.4 10*3/uL (ref 1.7–7.7)
Neutrophils Relative %: 60 %
Platelets: 220 10*3/uL (ref 150–400)
RBC: 3.77 MIL/uL — ABNORMAL LOW (ref 3.87–5.11)
RDW: 14.3 % (ref 11.5–15.5)
WBC: 8.9 10*3/uL (ref 4.0–10.5)
nRBC: 0 % (ref 0.0–0.2)

## 2022-03-08 LAB — CREATININE, SERUM
Creatinine, Ser: 0.7 mg/dL (ref 0.44–1.00)
GFR, Estimated: >60 mL/min (ref >60)

## 2022-03-08 LAB — PROTIME-INR
INR: 3.5 — ABNORMAL HIGH (ref 0.8–1.2)
Prothrombin Time: 34.7 seconds — ABNORMAL HIGH (ref 11.4–15.2)

## 2022-03-08 LAB — MAGNESIUM: Magnesium: 1.8 mg/dL (ref 1.7–2.4)

## 2022-03-08 MED ORDER — MAGNESIUM SULFATE 2 GM/50ML IV SOLN
2.0000 g | Freq: Once | INTRAVENOUS | Status: AC
  Administered 2022-03-08: 2 g via INTRAVENOUS
  Filled 2022-03-08: qty 50

## 2022-03-08 MED ORDER — POTASSIUM CHLORIDE 20 MEQ PO PACK
40.0000 meq | PACK | Freq: Four times a day (QID) | ORAL | Status: AC
  Administered 2022-03-08 (×2): 40 meq via ORAL
  Filled 2022-03-08 (×2): qty 2

## 2022-03-08 NOTE — Care Management Important Message (Signed)
Important Message ? ?Patient Details  ?Name: Carol [name] ?MRN: [id number] ?Date of Birth: 1941/05/27 ? ? ?Medicare Important Message Given:  Yes ? ?Patient is in an isolation room and I reviewed the Important Message from Medicare by phone [phone]) with the patent and her husband.  They understand the rights and I thanked them both for their time.  ? ? ?[name] ?03/08/2022, 11:40 AM ?

## 2022-03-08 NOTE — Progress Notes (Signed)
?PROGRESS NOTE ? ? ? ?Carol [name]  GEZ:[id number] DOB: 1941/06/19 DOA: 03/04/2022 ?PCP: Crecencio Mc, MD  ? ? ?Brief Narrative:  ?81 y.o. female with medical history significant for HFpEF, A-fib on amiodarone and Coumadin s/p ablation April 2022 and multiple cardioversions most recently 02/2022, who is s/p resection of melanoma right breast on 02/2722 who presents to the ED with increased pain, swelling and redness to the right breast in spite of doxycycline started a few days prior.  Patient also complains of generalized malaise and congestion and tested positive for COVID 5 days ago but states symptoms of congestion have been improving. ? ?Cellulitic changes have been improving.  On 5/4 patient had some drainage from surgical site.  Sutures intact, no evidence for dehiscence ? ?Assessment & Plan: ?  ?Principal Problem: ?  Severe sepsis ([place]) ?Active Problems: ?  Surgical wound infection ?  AKI (acute kidney injury) with anion gap metabolic acidosis ([place]) ?  Hyponatremia ?  Atrial fibrillation s/p ablation 2022, s/p cardioversion 02/2022 [facility]) ?  Warfarin anticoagulation ?  COVID-19 virus infection ?  (HFpEF) heart failure with preserved ejection fraction ([place]) ? ?* Severe sepsis ([place]) ?--fever, leukocytosis and elevated lactic acid, source is cellulitis ?  ?Surgical wound infection ?Patient is s/p resection of melanoma on 4/27 and reports increased drainage ?--CT chest showed Diffuse subcutaneous fat stranding throughout the right anterior chest wall and right breast, consistent with cellulitis. No evidence of fluid collection or abscess on this unenhanced exam. ?--started on vanc and cefepime on presentation ?--Drainage from surgical site noted 5/4 ?-- Superficial cultures with GPC ?Plan: ?Continue vancomycin for today ?Discontinue cefepime ?Follow wound culture ?Anticipate transition to p.o. antibiotics in preparation for discharge 5/6 ? ?AKI (acute kidney injury) with anion gap metabolic acidosis  ([place]) ?Suspect prerenal and related to sepsis ?Resolved ?  ?Hyponatremia ?Suspecting hypovolemic ?Improved with IVF. ?IVF stopped ?  ?Atrial fibrillation s/p ablation 2022, s/p cardioversion 02/2022 [facility]) ?--currently rate controlled ?Plan: ?--Continue amiodarone and warfarin ?--resume home cardizem ?-- Continue Toprol-XL ?  ?Warfarin anticoagulation ?Pharmacy to adjust warfarin for goal INR 2-3 ?  ?(HFpEF) heart failure with preserved ejection fraction ([place]) ?Compensated ?-- Continue home Lasix ?  ?COVID-19 virus infection ?Asymptomatic, no hypoxia ?Continue molnupiravir from 5/1 to 5/6.  Last dose tomorrow ?  ? ? ?DVT prophylaxis: Warfarin ?Code Status: Full ?Family Communication: Spouse Alyx Mcguirk [phone] on 5/3 ?Disposition Plan: Status is: Inpatient ?Remains inpatient appropriate because: Sepsis secondary to cellulitis.  Improving.  Tentative plan discharge home 5/6 ? ?Level of care: Telemetry Medical ? ?Consultants:  ?None ? ?Procedures:  ?None ? ?Antimicrobials: ?Vancomycin ? ? ?Subjective: ?Seen and examined.  Resting comfortably in bed.  No visible distress.  Cellulitic change on chest is improving. ? ?Objective: ?Vitals:  ? 03/07/22 1728 03/07/22 2108 03/08/22 0622 03/08/22 1275  ?BP: 130/87 (!) 149/84 (!) 152/86 139/76  ?Pulse: 78 75 82 73  ?Resp: 16 18 (!) 8 18  ?Temp: 97.7 ?F (36.5 ?C) 97.8 ?F (36.6 ?C) 98.4 ?F (36.9 ?C) 98.2 ?F (36.8 ?C)  ?TempSrc: Oral   Oral  ?SpO2: 93% 98% 94% 98%  ?Weight:      ?Height:      ? ? ?Intake/Output Summary (Last 24 hours) at 03/08/2022 1221 ?Last data filed at 03/08/2022 0701 ?Gross per 24 hour  ?Intake 890 ml  ?Output --  ?Net 890 ml  ? ?Filed Weights  ? 03/04/22 2118  ?Weight: 79.4 kg  ? ? ?Examination: ? ?General exam: No  acute distress ?Respiratory system: Lungs clear.  Normal work of breathing.  Room air ?Cardiovascular system: S1-S2, RRR, no murmurs, no pedal edema ?Gastrointestinal system: Soft, NT/ND, normal bowel sounds ?Central nervous system: Alert and  oriented. No focal neurological deficits. ?Extremities: Symmetric 5 x 5 power. ?Skin: Extensive cellulitic changes on the chest, greater on the right side, erythematous, warm, tender to touch.  Surgical site with serosanguineous drainage.  Improving over interval.  No wound dehiscence ?Psychiatry: Judgement and insight appear normal. Mood & affect appropriate.  ? ? ? ?Data Reviewed: I have personally reviewed following labs and imaging studies ? ?CBC: ?Recent Labs  ?Lab 03/04/22 ?2128 03/05/22 ?0424 03/06/22 ?0600 03/08/22 ?8413  ?WBC 20.0* 17.1* 13.5* 8.9  ?NEUTROABS  --   --   --  5.4  ?HGB 12.7 11.8* 11.0* 11.9*  ?HCT 38.4 35.1* 31.5* 34.5*  ?MCV 96.5 94.4 92.4 91.5  ?PLT 208 195 192 220  ? ?Basic Metabolic Panel: ?Recent Labs  ?Lab 03/04/22 ?2128 03/05/22 ?0424 03/06/22 ?0600 03/07/22 ?0425 03/08/22 ?2440  ?NA 121* 126* 134*  --  137  ?K 4.7 4.4 3.1*  --  3.0*  ?CL 87* 94* 105  --  102  ?CO2 17* 22 22  --  25  ?GLUCOSE 109* 107* 113*  --  97  ?BUN 37* 35* 21  --  17  ?CREATININE 2.20* 1.85* 0.94 0.73 0.65  0.70  ?CALCIUM 8.2* 8.6* 8.2*  --  8.3*  ?MG  --   --  2.0 1.8 1.8  ? ?GFR: ?Estimated Creatinine Clearance: 58.6 mL/min (by C-G formula based on SCr of 0.7 mg/dL). ?Liver Function Tests: ?No results for input(s): AST, ALT, ALKPHOS, BILITOT, PROT, ALBUMIN in the last 168 hours. ?No results for input(s): LIPASE, AMYLASE in the last 168 hours. ?No results for input(s): AMMONIA in the last 168 hours. ?Coagulation Profile: ?Recent Labs  ?Lab 03/04/22 ?2128 03/05/22 ?0424 03/06/22 ?0600 03/07/22 ?0425 03/08/22 ?1027  ?INR 2.8* 3.2* 2.9* 2.6* 3.5*  ? ?Cardiac Enzymes: ?No results for input(s): CKTOTAL, CKMB, CKMBINDEX, TROPONINI in the last 168 hours. ?BNP (last 3 results) ?Recent Labs  ?  10/15/21 ?1025 10/23/21 ?0849  ?PROBNP 1,651.0* 649.0*  ? ?HbA1C: ?No results for input(s): HGBA1C in the last 72 hours. ?CBG: ?No results for input(s): GLUCAP in the last 168 hours. ?Lipid Profile: ?No results for input(s):  CHOL, HDL, LDLCALC, TRIG, CHOLHDL, LDLDIRECT in the last 72 hours. ?Thyroid Function Tests: ?No results for input(s): TSH, T4TOTAL, FREET4, T3FREE, THYROIDAB in the last 72 hours. ?Anemia Panel: ?No results for input(s): VITAMINB12, FOLATE, FERRITIN, TIBC, IRON, RETICCTPCT in the last 72 hours. ?Sepsis Labs: ?Recent Labs  ?Lab 03/04/22 ?2128 03/04/22 ?2238 03/05/22 ?0424  ?PROCALCITON  --   --  2.32  ?LATICACIDVEN 2.6* 1.5  --   ? ? ?Recent Results (from the past 240 hour(s))  ?Blood culture (routine x 2)     Status: None (Preliminary result)  ? Collection Time: 03/04/22 10:38 PM  ? Specimen: BLOOD  ?Result Value Ref Range Status  ? Specimen Description BLOOD LEFT ASSIST CONTROL  Final  ? Special Requests   Final  ?  BOTTLES DRAWN AEROBIC AND ANAEROBIC Blood Culture adequate volume  ? Culture   Final  ?  NO GROWTH 4 DAYS ?Performed at [facility], [address] [place], [place] [address] ?  ? Report Status PENDING  Incomplete  ?Blood culture (routine x 2)     Status: None (Preliminary result)  ? Collection Time: 03/04/22  10:38 PM  ? Specimen: BLOOD  ?Result Value Ref Range Status  ? Specimen Description BLOOD RIGHT HAND  Final  ? Special Requests   Final  ?  BOTTLES DRAWN AEROBIC AND ANAEROBIC Blood Culture adequate volume  ? Culture   Final  ?  NO GROWTH 4 DAYS ?Performed at [facility], [address] [place], [place] [address] ?  ? Report Status PENDING  Incomplete  ?Aerobic Culture w Gram Stain (superficial specimen)     Status: None (Preliminary result)  ? Collection Time: 03/07/22 10:45 AM  ? Specimen: Wound  ?Result Value Ref Range Status  ? Specimen Description   Final  ?  WOUND ?Performed at [facility], [address] [place], [place] [address] ?  ? Special Requests   Final  ?  CHEST ?Performed at [facility], [address] [place], [place] [address] ?  ? Gram Stain NO WBC SEEN ?RARE GRAM POSITIVE COCCI IN PAIRS ?  Final  ? Culture   Final  ?  TOO YOUNG TO  READ ?Performed at [facility], [place] [address] [place], [place] [address] ?  ? Report Status PENDING  Incomplete  ?  ? ? ? ? ? ?Radiology Studies: ?No results found. ? ? ? ? ? ?Scheduled Meds: ? amiodaron

## 2022-03-08 NOTE — Progress Notes (Signed)
Pharmacy Antibiotic Note ? ?Carol [name] is a 81 y.o. female admitted on 03/04/2022 with cellulitis.  Pharmacy has been consulted for Vancomycin dosing x 7 days. GPC growing in wound culture.  ? ? ?Plan: ?Continue vancomycin '1250mg'$  Q24H ?Goal AUC 400-550. ?Expected AUC: 462 ?SCr used: 0.8 (rounded up); Vd 0.72; IBW ? ?Follow up cultures ? ? ?Height: '5\' 6"'$  (167.6 cm) ?Weight: 79.4 kg (175 lb) ?IBW/kg (Calculated) : 59.3 ? ?Temp (24hrs), Avg:98 ?F (36.7 ?C), Min:97.7 ?F (36.5 ?C), Max:98.4 ?F (36.9 ?C) ? ?Recent Labs  ?Lab 03/04/22 ?2128 03/04/22 ?2238 03/05/22 ?0424 03/06/22 ?0600 03/07/22 ?0425 03/08/22 ?1696  ?WBC 20.0*  --  17.1* 13.5*  --  8.9  ?CREATININE 2.20*  --  1.85* 0.94 0.73 0.65  0.70  ?LATICACIDVEN 2.6* 1.5  --   --   --   --   ? ?  ?Estimated Creatinine Clearance: 58.6 mL/min (by C-G formula based on SCr of 0.7 mg/dL).   ? ?Allergies  ?Allergen Reactions  ? Prednisone Palpitations  ?  A. fib  ? Pulmicort [Budesonide] Shortness Of Breath, Itching, Swelling and Other (See Comments)  ?  Felt as if things were crawly on her  ? Clonidine Other (See Comments)  ?  Pt felt like things were crawling on her arms.  ? Lotemax [Loteprednol Etabonate]   ?  Broke out around the eye  ? Morphine And Related Itching and Other (See Comments)  ?  "creepy crawly feeling"  ? Trovan [Alatrofloxacin Mesylate] Other (See Comments)  ?  "effected the whole system"  ? Zelnorm [Tegaserod Maleate] Itching  ?  Felt as if things were crawly on her ?  ? Erythromycin Rash  ? ? ?Antimicrobials this admission: ?5/01 Ceftriaxone >> x 1 dose ?5/01 Vancomycin >> 5/7 ?5/02 Cefepime >> 5/4 ? ?Microbiology results: ?5/01 BCx: NGTD ?5/04 Wcx: GPC ? ? ?Thank you for allowing pharmacy to be a part of this pleasant patient?s care. ? ?Sherilyn Banker, PharmD, MS PGPM ?Clinical Pharmacist ?03/08/2022 ?9:20 AM ? ? ? ?

## 2022-03-08 NOTE — Consult Note (Signed)
ANTICOAGULATION CONSULT NOTE - Consult ? ?Pharmacy Consult for warfarin management ?Indication: atrial fibrillation ? ?Allergies  ?Allergen Reactions  ? Prednisone Palpitations  ?  A. fib  ? Pulmicort [Budesonide] Shortness Of Breath, Itching, Swelling and Other (See Comments)  ?  Felt as if things were crawly on her  ? Clonidine Other (See Comments)  ?  Pt felt like things were crawling on her arms.  ? Lotemax [Loteprednol Etabonate]   ?  Broke out around the eye  ? Morphine And Related Itching and Other (See Comments)  ?  "creepy crawly feeling"  ? Trovan [Alatrofloxacin Mesylate] Other (See Comments)  ?  "effected the whole system"  ? Zelnorm [Tegaserod Maleate] Itching  ?  Felt as if things were crawly on her ?  ? Erythromycin Rash  ? ? ?Patient Measurements: ?Height: '5\' 6"'$  (167.6 cm) ?Weight: 79.4 kg (175 lb) ?IBW/kg (Calculated) : 59.3 ?Heparin Dosing Weight:  ? ?Vital Signs: ?Temp: 98.4 ?F (36.9 ?C) (05/05 0630) ?BP: 152/86 (05/05 0622) ?Pulse Rate: 82 (05/05 0622) ? ?Labs: ?Recent Labs  ?  03/06/22 ?0600 03/07/22 ?0425 03/08/22 ?1601  ?HGB 11.0*  --   --   ?HCT 31.5*  --   --   ?PLT 192  --   --   ?LABPROT 29.9* 27.6* 34.7*  ?INR 2.9* 2.6* 3.5*  ?CREATININE 0.94 0.73 0.70  ? ? ? ?Estimated Creatinine Clearance: 58.6 mL/min (by C-G formula based on SCr of 0.7 mg/dL). ? ? ?Medications:  ?VKA PTA was taking '5mg'$  on Mon/Fri  & '4mg'$  All other days of the week ? ? ?Assessment: ?81yo female HFpEF, A-fib (on amiodarone and Coumadin s/p ablation April 2022 and multiple cardioversions most recently 02/2022), & (s/p resection of melanoma right breast on 02/28/22) presenting to ED with increased pain, swelling and redness to the right breast in spite of doxycycline started a few days prior. ?   ?Goal of Therapy:  ?INR 2-3 ?Monitor platelets by anticoagulation protocol: Yes ? ?Date INR Dose ?5.1 2.8 '5mg'$  -pt reported ?5/2 3.2 '3mg'$  ?5/3 2.9 '4mg'$  ?5/4 2.6 '4mg'$  ?5/5 3.5 ? ?Plan:  ?INR increased from 2.6 to 3.5 and is  supratherapeutic. Will hold warfarin dose tonight. ?Daily INR ?Weekly CBC ? ? ?Sherilyn Banker, PharmD ?Clinical Pharmacist  ?03/08/2022 7:28 AM  ? ? ? ?

## 2022-03-08 NOTE — Progress Notes (Signed)
Physical Therapy Treatment ?Patient Details ?Name: Carol [name] ?MRN: [id number] ?DOB: 10-22-1941 ?Today's Date: 03/08/2022 ? ? ?History of Present Illness presented to ER secondary to redness, pain, edema to R chest/breast (s/p melanoma resection); admitted for management of sepsis related to R breast/chest cellulitis. ? ?  ?PT Comments  ? ? Excellent mobility progression.  Easily completes all transfers, gait and stairs with sup/mod indep, no assist device.  Good gait mechanics, balance and overall safety. ?Acute rehab goals met; no additional PT needs identified at this time.  Patient/husband in agreement.  ?Will discontinue skilled PT services at this time, as goals met, patient returned to baseline; will engage with mobility specialists throughout remaining stay to ensure continued mobilization at highest level throughout remaining hospital stay. ? ?   ?Recommendations for follow up therapy are one component of a multi-disciplinary discharge planning process, led by the attending physician.  Recommendations may be updated based on patient status, additional functional criteria and insurance authorization. ? ?Follow Up Recommendations ? No PT follow up ?  ?  ?Assistance Recommended at Discharge PRN  ?Patient can return home with the following   ?  ?Equipment Recommendations ? None recommended by PT  ?  ?Recommendations for Other Services   ? ? ?  ?Precautions / Restrictions Precautions ?Precautions: Fall ?Precaution Comments: R chest/breast incision ?Restrictions ?Weight Bearing Restrictions: No  ?  ? ?Mobility ? Bed Mobility ?Overal bed mobility: Modified Independent ?  ?  ?  ?  ?  ?  ?  ?  ? ?Transfers ?Overall transfer level: Modified independent ?  ?Transfers: Sit to/from Stand ?Sit to Stand: Modified independent (Device/Increase time) ?  ?  ?  ?  ?  ?  ?  ? ?Ambulation/Gait ?Ambulation/Gait assistance: Modified independent (Device/Increase time) ?Gait Distance (Feet): 600 Feet ?Assistive device:  None ?  ?  ?  ?  ?General Gait Details: reciprocal stepping pattern with good step height/length, good trunk rotation and arm swing; completes dynamic gait components without buckling, LOB or safety concern ? ? ?Stairs ?Stairs: Yes ?Stairs assistance: Supervision ?Stair Management: One rail Right ?Number of Stairs: 6 ?General stair comments: reciprocal stepping; mixed step to versus step through gait pattern. does prefer/require UE support for optimal safety/stability ? ? ?Wheelchair Mobility ?  ? ?Modified Rankin (Stroke Patients Only) ?  ? ? ?  ?Balance Overall balance assessment: Needs assistance ?Sitting-balance support: No upper extremity supported, Feet supported ?Sitting balance-Leahy Scale: Normal ?  ?  ?Standing balance support: No upper extremity supported ?Standing balance-Leahy Scale: Good ?  ?  ?  ?  ?  ?  ?  ?  ?  ?  ?  ?  ?  ? ?  ?Cognition Arousal/Alertness: Awake/alert ?Behavior During Therapy: [facility] for tasks assessed/performed ?Overall Cognitive Status: Within Functional Limits for tasks assessed ?  ?  ?  ?  ?  ?  ?  ?  ?  ?  ?  ?  ?  ?  ?  ?  ?  ?  ?  ? ?  ?Exercises   ? ?  ?General Comments   ?  ?  ? ?Pertinent Vitals/Pain Pain Assessment ?Pain Assessment: Faces ?Faces Pain Scale: Hurts little more ?Pain Location: R chest/breast ?Pain Descriptors / Indicators: Grimacing, Aching ?Pain Intervention(s): Limited activity within patient's tolerance, Monitored during session, Repositioned  ? ? ?Home Living   ?  ?  ?  ?  ?  ?  ?  ?  ?  ?   ?  ?  Prior Function    ?  ?  ?   ? ?PT Goals (current goals can now be found in the care plan section) Acute Rehab PT Goals ?Patient Stated Goal: to return home ?PT Goal Formulation: With patient ?Time For Goal Achievement: 03/20/22 ?Progress towards PT goals: Goals met/education completed, patient discharged from PT ? ?  ?Frequency ? ? ?  (discharge from PT) ? ? ? ?  ?PT Plan Discharge plan needs to be updated  ? ? ?Co-evaluation   ?  ?  ?  ?  ? ?  ?AM-PAC PT "6  Clicks" Mobility   ?Outcome Measure ? Help needed turning from your back to your side while in a flat bed without using bedrails?: None ?Help needed moving from lying on your back to sitting on the side of a flat bed without using bedrails?: None ?Help needed moving to and from a bed to a chair (including a wheelchair)?: None ?Help needed standing up from a chair using your arms (e.g., wheelchair or bedside chair)?: None ?Help needed to walk in hospital room?: None ?Help needed climbing 3-5 steps with a railing? : None ?6 Click Score: 24 ? ?  ?End of Session   ?Activity Tolerance: Patient tolerated treatment well ?Patient left: with family/visitor present;in bed ?Nurse Communication: Mobility status ?PT Visit Diagnosis: Muscle weakness (generalized) (M62.81);Difficulty in walking, not elsewhere classified (R26.2) ?  ? ? ?Time: 3437-3578 ?PT Time Calculation (min) (ACUTE ONLY): 16 min ? ?Charges:  $Gait Training: 8-22 mins          ?          ?Analaura Messler H. Owens Shark, PT, DPT, NCS ?03/08/22, 2:32 PM ?[phone] ? ? ?

## 2022-03-09 DIAGNOSIS — A419 Sepsis, unspecified organism: Secondary | ICD-10-CM | POA: Diagnosis not present

## 2022-03-09 DIAGNOSIS — R6520 Severe sepsis without septic shock: Secondary | ICD-10-CM | POA: Diagnosis not present

## 2022-03-09 LAB — CULTURE, BLOOD (ROUTINE X 2)
Culture: NO GROWTH
Culture: NO GROWTH
Special Requests: ADEQUATE
Special Requests: ADEQUATE

## 2022-03-09 LAB — PROTIME-INR
INR: 3.2 — ABNORMAL HIGH (ref 0.8–1.2)
Prothrombin Time: 32.5 seconds — ABNORMAL HIGH (ref 11.4–15.2)

## 2022-03-09 LAB — CREATININE, SERUM
Creatinine, Ser: 0.71 mg/dL (ref 0.44–1.00)
GFR, Estimated: >60 mL/min (ref >60)

## 2022-03-09 MED ORDER — WARFARIN SODIUM 2 MG PO TABS
2.0000 mg | ORAL_TABLET | Freq: Once | ORAL | Status: AC
  Administered 2022-03-09: 2 mg via ORAL
  Filled 2022-03-09: qty 1

## 2022-03-09 MED ORDER — KETOROLAC TROMETHAMINE 15 MG/ML IJ SOLN
15.0000 mg | Freq: Four times a day (QID) | INTRAMUSCULAR | Status: DC
  Administered 2022-03-09 – 2022-03-12 (×11): 15 mg via INTRAVENOUS
  Filled 2022-03-09 (×11): qty 1

## 2022-03-09 NOTE — Consult Note (Signed)
ANTICOAGULATION CONSULT NOTE - Consult ? ?Pharmacy Consult for warfarin management ?Indication: atrial fibrillation ? ?Allergies  ?Allergen Reactions  ? Prednisone Palpitations  ?  A. fib  ? Pulmicort [Budesonide] Shortness Of Breath, Itching, Swelling and Other (See Comments)  ?  Felt as if things were crawly on her  ? Clonidine Other (See Comments)  ?  Pt felt like things were crawling on her arms.  ? Lotemax [Loteprednol Etabonate]   ?  Broke out around the eye  ? Morphine And Related Itching and Other (See Comments)  ?  "creepy crawly feeling"  ? Trovan [Alatrofloxacin Mesylate] Other (See Comments)  ?  "effected the whole system"  ? Zelnorm [Tegaserod Maleate] Itching  ?  Felt as if things were crawly on her ?  ? Erythromycin Rash  ? ? ?Patient Measurements: ?Height: '5\' 6"'$  (167.6 cm) ?Weight: 79.4 kg (175 lb) ?IBW/kg (Calculated) : 59.3 ?Heparin Dosing Weight:  ? ?Vital Signs: ?Temp: 97.9 ?F (36.6 ?C) (05/06 0800) ?BP: 147/78 (05/06 0800) ?Pulse Rate: 90 (05/06 0800) ? ?Labs: ?Recent Labs  ?  03/07/22 ?0425 03/08/22 ?9935 03/09/22 ?0503  ?HGB  --  11.9*  --   ?HCT  --  34.5*  --   ?PLT  --  220  --   ?LABPROT 27.6* 34.7* 32.5*  ?INR 2.6* 3.5* 3.2*  ?CREATININE 0.73 0.65  0.70 0.71  ? ? ? ?Estimated Creatinine Clearance: 58.6 mL/min (by C-G formula based on SCr of 0.71 mg/dL). ? ? ?Medications:  ?VKA PTA was taking '5mg'$  on Mon/Fri  & '4mg'$  All other days of the week ? ? ?Assessment: ?81yo female HFpEF, A-fib (on amiodarone and Coumadin s/p ablation April 2022 and multiple cardioversions most recently 02/2022), & (s/p resection of melanoma right breast on 02/28/22) presenting to ED with increased pain, swelling and redness to the right breast in spite of doxycycline started a few days prior. ?   ?Goal of Therapy:  ?INR 2-3 ?Monitor platelets by anticoagulation protocol: Yes ? ?Date INR Dose ?5.1 2.8 '5mg'$  -pt reported ?5/2 3.2 '3mg'$  ?5/3 2.9 '4mg'$  ?5/4 2.6 '4mg'$  ?5/5 3.5 Hold ?5/6 3.2  ? ?Plan:  ?INR 3.2(supratherapeutic).  Will order '3mg'$  x 1.  ?Daily INR ?Weekly CBC ?DDI: amiodarone ? ? ?Pearla Dubonnet, PharmD ?Clinical Pharmacist  ?03/09/2022 10:32 AM  ? ? ? ?

## 2022-03-09 NOTE — Progress Notes (Signed)
?PROGRESS NOTE ? ? ? ?Carol [name]  ATF:[id number] DOB: 01-23-1941 DOA: 03/04/2022 ?PCP: Crecencio Mc, MD  ? ? ?Brief Narrative:  ?81 y.o. female with medical history significant for HFpEF, A-fib on amiodarone and Coumadin s/p ablation April 2022 and multiple cardioversions most recently 02/2022, who is s/p resection of melanoma right breast on 02/2722 who presents to the ED with increased pain, swelling and redness to the right breast in spite of doxycycline started a few days prior.  Patient also complains of generalized malaise and congestion and tested positive for COVID 5 days ago but states symptoms of congestion have been improving. ? ?Cellulitic changes have been improving.  On 5/4 patient had some drainage from surgical site.  Sutures intact, no evidence for dehiscence ? ?Assessment & Plan: ?  ?Principal Problem: ?  Severe sepsis ([place]) ?Active Problems: ?  Surgical wound infection ?  AKI (acute kidney injury) with anion gap metabolic acidosis ([place]) ?  Hyponatremia ?  Atrial fibrillation s/p ablation 2022, s/p cardioversion 02/2022 [facility]) ?  Warfarin anticoagulation ?  COVID-19 virus infection ?  (HFpEF) heart failure with preserved ejection fraction ([place]) ? ?* Severe sepsis ([place]) ?--fever, leukocytosis and elevated lactic acid, source is cellulitis ?  ?Surgical wound infection ?Patient is s/p resection of melanoma on 4/27 and reports increased drainage ?--CT chest showed Diffuse subcutaneous fat stranding throughout the right anterior chest wall and right breast, consistent with cellulitis. No evidence of fluid collection or abscess on this unenhanced exam. ?--started on vanc and cefepime on presentation ?--Drainage from surgical site noted 5/4 ?-- Superficial cultures with GPC, species pending ?Plan: ?Continue vancomycin for 1 additional day ?Follow wound cultures ?If drainage improves anticipate transition to p.o. antibiotics and discharge 5/7 ? ?AKI (acute kidney injury) with anion gap metabolic  acidosis ([place]) ?Suspect prerenal and related to sepsis ?Resolved ?  ?Hyponatremia ?Suspecting hypovolemic ?Improved with IVF. ?IVF stopped ?  ?Atrial fibrillation s/p ablation 2022, s/p cardioversion 02/2022 [facility]) ?--currently rate controlled ?Plan: ?--Continue amiodarone and warfarin ?--resume home cardizem ?-- Continue Toprol-XL ?  ?Warfarin anticoagulation ?Pharmacy to adjust warfarin for goal INR 2-3 ?  ?(HFpEF) heart failure with preserved ejection fraction ([place]) ?Compensated ?-- Continue home Lasix ?  ?COVID-19 virus infection ?Asymptomatic, no hypoxia ?Continue molnupiravir from 5/1 to 5/6.  Last dose tomorrow ?  ? ? ?DVT prophylaxis: Warfarin ?Code Status: Full ?Family Communication: Spouse Karaline Buresh [phone] on 5/3 ?Disposition Plan: Status is: Inpatient ?Remains inpatient appropriate because: Sepsis secondary to cellulitis.  Improving.  Tentative plan discharge home 5/7 ? ?Level of care: Telemetry Medical ? ?Consultants:  ?None ? ?Procedures:  ?None ? ?Antimicrobials: ?Vancomycin ? ? ?Subjective: ?Seen and examined.  Resting comfortably in bed.  No visible distress.  Cellulitic change on chest is improving. ? ?Objective: ?Vitals:  ? 03/08/22 1700 03/08/22 2122 03/09/22 0441 03/09/22 0800  ?BP: (!) 150/82 (!) 149/88 122/69 (!) 147/78  ?Pulse: 71 87 82 90  ?Resp: '16 18 16 18  '$ ?Temp: 98.7 ?F (37.1 ?C) 98.1 ?F (36.7 ?C) 98.4 ?F (36.9 ?C) 97.9 ?F (36.6 ?C)  ?TempSrc:      ?SpO2: 99% 98% 98% 97%  ?Weight:      ?Height:      ? ? ?Intake/Output Summary (Last 24 hours) at 03/09/2022 1103 ?Last data filed at 03/09/2022 0300 ?Gross per 24 hour  ?Intake 1656.98 ml  ?Output --  ?Net 1656.98 ml  ? ?Filed Weights  ? 03/04/22 2118  ?Weight: 79.4 kg  ? ? ?Examination: ? ?  General exam: NAD.  Sitting up on side of bed ?Respiratory system: Lungs clear.  Normal work of breathing.  Room air ?Cardiovascular system: S1-S2, RRR, no murmurs, no pedal edema ?Gastrointestinal system: Soft, NT/ND, normal bowel sounds ?Central  nervous system: Alert and oriented. No focal neurological deficits. ?Extremities: Symmetric 5 x 5 power. ?Skin: Extensive cellulitic changes on the chest, greater on the right side, erythematous, warm, tender to touch.  Surgical site with serosanguineous drainage.  Less drainage arrival.  Wound appears intact.  No dehiscence. ?Psychiatry: Judgement and insight appear normal. Mood & affect appropriate.  ? ? ? ?Data Reviewed: I have personally reviewed following labs and imaging studies ? ?CBC: ?Recent Labs  ?Lab 03/04/22 ?2128 03/05/22 ?0424 03/06/22 ?0600 03/08/22 ?8657  ?WBC 20.0* 17.1* 13.5* 8.9  ?NEUTROABS  --   --   --  5.4  ?HGB 12.7 11.8* 11.0* 11.9*  ?HCT 38.4 35.1* 31.5* 34.5*  ?MCV 96.5 94.4 92.4 91.5  ?PLT 208 195 192 220  ? ?Basic Metabolic Panel: ?Recent Labs  ?Lab 03/04/22 ?2128 03/05/22 ?0424 03/06/22 ?0600 03/07/22 ?0425 03/08/22 ?8469 03/09/22 ?0503  ?NA 121* 126* 134*  --  137  --   ?K 4.7 4.4 3.1*  --  3.0*  --   ?CL 87* 94* 105  --  102  --   ?CO2 17* 22 22  --  25  --   ?GLUCOSE 109* 107* 113*  --  97  --   ?BUN 37* 35* 21  --  17  --   ?CREATININE 2.20* 1.85* 0.94 0.73 0.65  0.70 0.71  ?CALCIUM 8.2* 8.6* 8.2*  --  8.3*  --   ?MG  --   --  2.0 1.8 1.8  --   ? ?GFR: ?Estimated Creatinine Clearance: 58.6 mL/min (by C-G formula based on SCr of 0.71 mg/dL). ?Liver Function Tests: ?No results for input(s): AST, ALT, ALKPHOS, BILITOT, PROT, ALBUMIN in the last 168 hours. ?No results for input(s): LIPASE, AMYLASE in the last 168 hours. ?No results for input(s): AMMONIA in the last 168 hours. ?Coagulation Profile: ?Recent Labs  ?Lab 03/05/22 ?0424 03/06/22 ?0600 03/07/22 ?0425 03/08/22 ?6295 03/09/22 ?0503  ?INR 3.2* 2.9* 2.6* 3.5* 3.2*  ? ?Cardiac Enzymes: ?No results for input(s): CKTOTAL, CKMB, CKMBINDEX, TROPONINI in the last 168 hours. ?BNP (last 3 results) ?Recent Labs  ?  10/15/21 ?1025 10/23/21 ?0849  ?PROBNP 1,651.0* 649.0*  ? ?HbA1C: ?No results for input(s): HGBA1C in the last 72  hours. ?CBG: ?No results for input(s): GLUCAP in the last 168 hours. ?Lipid Profile: ?No results for input(s): CHOL, HDL, LDLCALC, TRIG, CHOLHDL, LDLDIRECT in the last 72 hours. ?Thyroid Function Tests: ?No results for input(s): TSH, T4TOTAL, FREET4, T3FREE, THYROIDAB in the last 72 hours. ?Anemia Panel: ?No results for input(s): VITAMINB12, FOLATE, FERRITIN, TIBC, IRON, RETICCTPCT in the last 72 hours. ?Sepsis Labs: ?Recent Labs  ?Lab 03/04/22 ?2128 03/04/22 ?2238 03/05/22 ?0424  ?PROCALCITON  --   --  2.32  ?LATICACIDVEN 2.6* 1.5  --   ? ? ?Recent Results (from the past 240 hour(s))  ?Blood culture (routine x 2)     Status: None  ? Collection Time: 03/04/22 10:38 PM  ? Specimen: BLOOD  ?Result Value Ref Range Status  ? Specimen Description BLOOD LEFT ASSIST CONTROL  Final  ? Special Requests   Final  ?  BOTTLES DRAWN AEROBIC AND ANAEROBIC Blood Culture adequate volume  ? Culture   Final  ?  NO GROWTH 5 DAYS ?Performed at [facility], [address], [place], [place] [address] ?  ? Report Status 03/09/2022 FINAL  Final  ?Blood culture (routine x 2)     Status: None  ? Collection Time: 03/04/22 10:38 PM  ? Specimen: BLOOD  ?Result Value Ref Range Status  ? Specimen Description BLOOD RIGHT HAND  Final  ? Special Requests   Final  ?  BOTTLES DRAWN AEROBIC AND ANAEROBIC Blood Culture adequate volume  ? Culture   Final  ?  NO GROWTH 5 DAYS ?Performed at [facility], [address] [place], [place] [address] ?  ? Report Status 03/09/2022 FINAL  Final  ?Aerobic Culture w Gram Stain (superficial specimen)     Status: None (Preliminary result)  ? Collection Time: 03/07/22 10:45 AM  ? Specimen: Wound  ?Result Value Ref Range Status  ? Specimen Description   Final  ?  WOUND ?Performed at [facility], [address] [place], [place] [address] ?  ? Special Requests   Final  ?  CHEST ?Performed at [facility], [address] [place], [place] [address] ?  ? Gram Stain NO WBC  SEEN ?RARE GRAM POSITIVE COCCI IN PAIRS ?  Final  ? Culture   Final  ?  TOO YOUNG TO READ ?Performed at [facility], [place] [address] [place],  [address] ?  ? Report Status PENDING  Incomplete  ?  ? ? ? ? ? ?Radio

## 2022-03-09 NOTE — Progress Notes (Signed)
Mobility Specialist - Progress Note ? ? ? 03/09/22 1600  ?Mobility  ?Activity Refused mobility  ? ?Pt refused mobility second time this date -- voiced fatigue and recently eating. Will attempt at another date and time. ? ?[name] ?Mobility Specialist ?03/09/22, 4:06 PM ? ? ? ?

## 2022-03-10 DIAGNOSIS — A419 Sepsis, unspecified organism: Secondary | ICD-10-CM | POA: Diagnosis not present

## 2022-03-10 DIAGNOSIS — R6520 Severe sepsis without septic shock: Secondary | ICD-10-CM | POA: Diagnosis not present

## 2022-03-10 LAB — AEROBIC CULTURE W GRAM STAIN (SUPERFICIAL SPECIMEN): Gram Stain: NONE SEEN

## 2022-03-10 LAB — PROTIME-INR
INR: 3.2 — ABNORMAL HIGH (ref 0.8–1.2)
Prothrombin Time: 32.4 seconds — ABNORMAL HIGH (ref 11.4–15.2)

## 2022-03-10 MED ORDER — WARFARIN SODIUM 1 MG PO TABS
1.0000 mg | ORAL_TABLET | Freq: Once | ORAL | Status: AC
  Administered 2022-03-10: 1 mg via ORAL
  Filled 2022-03-10: qty 1

## 2022-03-10 NOTE — Consult Note (Signed)
ANTICOAGULATION CONSULT NOTE - Consult ? ?Pharmacy Consult for warfarin management ?Indication: atrial fibrillation ? ?Allergies  ?Allergen Reactions  ? Prednisone Palpitations  ?  A. fib  ? Pulmicort [Budesonide] Shortness Of Breath, Itching, Swelling and Other (See Comments)  ?  Felt as if things were crawly on her  ? Clonidine Other (See Comments)  ?  Pt felt like things were crawling on her arms.  ? Lotemax [Loteprednol Etabonate]   ?  Broke out around the eye  ? Morphine And Related Itching and Other (See Comments)  ?  "creepy crawly feeling"  ? Trovan [Alatrofloxacin Mesylate] Other (See Comments)  ?  "effected the whole system"  ? Zelnorm [Tegaserod Maleate] Itching  ?  Felt as if things were crawly on her ?  ? Erythromycin Rash  ? ? ?Patient Measurements: ?Height: '5\' 6"'$  (167.6 cm) ?Weight: 79.4 kg (175 lb) ?IBW/kg (Calculated) : 59.3 ?Heparin Dosing Weight:  ? ?Vital Signs: ?Temp: 98 ?F (36.7 ?C) (05/07 0745) ?BP: 122/75 (05/07 0745) ?Pulse Rate: 69 (05/07 0745) ? ?Labs: ?Recent Labs  ?  03/08/22 ?8250 03/09/22 ?0503 03/10/22 ?5397  ?HGB 11.9*  --   --   ?HCT 34.5*  --   --   ?PLT 220  --   --   ?LABPROT 34.7* 32.5* 32.4*  ?INR 3.5* 3.2* 3.2*  ?CREATININE 0.65  0.70 0.71  --   ? ? ? ?Estimated Creatinine Clearance: 58.6 mL/min (by C-G formula based on SCr of 0.71 mg/dL). ? ? ?Medications:  ?VKA PTA was taking '5mg'$  on Mon/Fri  & '4mg'$  All other days of the week ? ? ?Assessment: ?81yo female HFpEF, A-fib (on amiodarone and Coumadin s/p ablation April 2022 and multiple cardioversions most recently 02/2022), & (s/p resection of melanoma right breast on 02/28/22) presenting to ED with increased pain, swelling and redness to the right breast in spite of doxycycline started a few days prior. ?   ?Goal of Therapy:  ?INR 2-3 ?Monitor platelets by anticoagulation protocol: Yes ? ?Date INR Dose ?5.1 2.8 '5mg'$  -pt reported ?5/2 3.2 '3mg'$  ?5/3 2.9 '4mg'$  ?5/4 2.6 '4mg'$  ?5/5 3.5 Hold ?5/6 3.2 '2mg'$  ?5/7 3.2  ? ?Plan:  ?INR  3.2(supratherapeutic). Will order warfarin '1mg'$  x 1.  ?Daily INR ?Weekly CBC ?DDI: amiodarone ? ? ?Pearla Dubonnet, PharmD ?Clinical Pharmacist  ?03/10/2022 11:15 AM  ? ? ? ?

## 2022-03-10 NOTE — Progress Notes (Signed)
?PROGRESS NOTE ? ? ? ?Carol [name]  EXH:[id number] DOB: 12/01/1940 DOA: 03/04/2022 ?PCP: Crecencio Mc, MD  ? ? ?Brief Narrative:  ?82 y.o. female with medical history significant for HFpEF, A-fib on amiodarone and Coumadin s/p ablation April 2022 and multiple cardioversions most recently 02/2022, who is s/p resection of melanoma right breast on 02/2722 who presents to the ED with increased pain, swelling and redness to the right breast in spite of doxycycline started a few days prior.  Patient also complains of generalized malaise and congestion and tested positive for COVID 5 days ago but states symptoms of congestion have been improving. ? ?Cellulitic changes have been improving.  On 5/4 patient had some drainage from surgical site.  Sutures intact, no evidence for dehiscence ? ?Assessment & Plan: ?  ?Principal Problem: ?  Severe sepsis ([place]) ?Active Problems: ?  Surgical wound infection ?  AKI (acute kidney injury) with anion gap metabolic acidosis ([place]) ?  Hyponatremia ?  Atrial fibrillation s/p ablation 2022, s/p cardioversion 02/2022 [facility]) ?  Warfarin anticoagulation ?  COVID-19 virus infection ?  (HFpEF) heart failure with preserved ejection fraction ([place]) ? ?* Severe sepsis ([place]) ?--fever, leukocytosis and elevated lactic acid, source is cellulitis ?  ?Surgical wound infection ?Patient is s/p resection of melanoma on 4/27 and reports increased drainage ?--CT chest showed Diffuse subcutaneous fat stranding throughout the right anterior chest wall and right breast, consistent with cellulitis. No evidence of fluid collection or abscess on this unenhanced exam. ?--started on vanc and cefepime on presentation ?--Drainage from surgical site noted 5/4 ?-- Superficial cultures with GPC, moderate Staph aureus ?Plan: ?Continue vancomycin for additional day ?Follow wound cultures, moderate Staph aureus, sensitivities pending ?If drainage improves anticipate transition to p.o. antibiotics and discharge  5/8 ? ?AKI (acute kidney injury) with anion gap metabolic acidosis ([place]) ?Suspect prerenal and related to sepsis ?Resolved ?  ?Hyponatremia ?Suspecting hypovolemic ?Improved with IVF. ?IVF stopped ?  ?Atrial fibrillation s/p ablation 2022, s/p cardioversion 02/2022 [facility]) ?--currently rate controlled ?Plan: ?--Continue amiodarone and warfarin ?--resume home cardizem ?-- Continue Toprol-XL ?  ?Warfarin anticoagulation ?Pharmacy to adjust warfarin for goal INR 2-3 ?  ?(HFpEF) heart failure with preserved ejection fraction ([place]) ?Compensated ?-- Continue home Lasix ?  ?COVID-19 virus infection ?Asymptomatic, no hypoxia ?Completed molnupiravir from 5/1 to 5/6.   ?No indication for respiratory isolation ?  ? ? ?DVT prophylaxis: Warfarin ?Code Status: Full ?Family Communication: Spouse Sarah-Jane Nazario [phone] on 5/3.  Offered to call on 5/7.  Patient declined. ?Disposition Plan: Status is: Inpatient ?Remains inpatient appropriate because: Sepsis secondary to cellulitis.  Improving.  Tentative plan discharge home 5/8 ? ?Level of care: Med-Surg ? ?Consultants:  ?None ? ?Procedures:  ?None ? ?Antimicrobials: ?Vancomycin ? ? ?Subjective: ?Seen and examined.  Resting comfortably in bed.  No visible distress.  Cellulitic change on chest is improving.  Endorses some fatigue.  Endorses persistent drainage from the wound ? ?Objective: ?Vitals:  ? 03/09/22 1734 03/09/22 2026 03/10/22 0605 03/10/22 0745  ?BP: 124/81 133/84 129/68 122/75  ?Pulse: 76 89 62 69  ?Resp: '18 18 16 18  '$ ?Temp: 97.7 ?F (36.5 ?C) 97.6 ?F (36.4 ?C) 97.9 ?F (36.6 ?C) 98 ?F (36.7 ?C)  ?TempSrc:  Oral    ?SpO2: 98% 99% 97% 100%  ?Weight:      ?Height:      ? ? ?Intake/Output Summary (Last 24 hours) at 03/10/2022 0955 ?Last data filed at 03/10/2022 0100 ?Gross per 24 hour  ?Intake 250 ml  ?  Output --  ?Net 250 ml  ? ?Filed Weights  ? 03/04/22 2118  ?Weight: 79.4 kg  ? ? ?Examination: ? ?General exam: NAD.  Sitting up on side of bed ?Respiratory system: Lungs clear.   Normal work of breathing.  Room air ?Cardiovascular system: S1-S2, RRR, no murmurs, no pedal edema ?Gastrointestinal system: Soft, NT/ND, normal bowel sounds ?Central nervous system: Alert and oriented. No focal neurological deficits. ?Extremities: Symmetric 5 x 5 power. ?Skin: Extensive cellulitic changes on the chest, greater on the right side, erythematous, warm, tender to touch.  Surgical site with serosanguineous drainage.  Less drainage arrival.  Wound appears intact.  No dehiscence. ?Psychiatry: Judgement and insight appear normal. Mood & affect appropriate.  ? ? ? ?Data Reviewed: I have personally reviewed following labs and imaging studies ? ?CBC: ?Recent Labs  ?Lab 03/04/22 ?2128 03/05/22 ?0424 03/06/22 ?0600 03/08/22 ?6811  ?WBC 20.0* 17.1* 13.5* 8.9  ?NEUTROABS  --   --   --  5.4  ?HGB 12.7 11.8* 11.0* 11.9*  ?HCT 38.4 35.1* 31.5* 34.5*  ?MCV 96.5 94.4 92.4 91.5  ?PLT 208 195 192 220  ? ?Basic Metabolic Panel: ?Recent Labs  ?Lab 03/04/22 ?2128 03/05/22 ?0424 03/06/22 ?0600 03/07/22 ?0425 03/08/22 ?5726 03/09/22 ?0503  ?NA 121* 126* 134*  --  137  --   ?K 4.7 4.4 3.1*  --  3.0*  --   ?CL 87* 94* 105  --  102  --   ?CO2 17* 22 22  --  25  --   ?GLUCOSE 109* 107* 113*  --  97  --   ?BUN 37* 35* 21  --  17  --   ?CREATININE 2.20* 1.85* 0.94 0.73 0.65  0.70 0.71  ?CALCIUM 8.2* 8.6* 8.2*  --  8.3*  --   ?MG  --   --  2.0 1.8 1.8  --   ? ?GFR: ?Estimated Creatinine Clearance: 58.6 mL/min (by C-G formula based on SCr of 0.71 mg/dL). ?Liver Function Tests: ?No results for input(s): AST, ALT, ALKPHOS, BILITOT, PROT, ALBUMIN in the last 168 hours. ?No results for input(s): LIPASE, AMYLASE in the last 168 hours. ?No results for input(s): AMMONIA in the last 168 hours. ?Coagulation Profile: ?Recent Labs  ?Lab 03/06/22 ?0600 03/07/22 ?0425 03/08/22 ?2035 03/09/22 ?0503 03/10/22 ?5974  ?INR 2.9* 2.6* 3.5* 3.2* 3.2*  ? ?Cardiac Enzymes: ?No results for input(s): CKTOTAL, CKMB, CKMBINDEX, TROPONINI in the last 168  hours. ?BNP (last 3 results) ?Recent Labs  ?  10/15/21 ?1025 10/23/21 ?0849  ?PROBNP 1,651.0* 649.0*  ? ?HbA1C: ?No results for input(s): HGBA1C in the last 72 hours. ?CBG: ?No results for input(s): GLUCAP in the last 168 hours. ?Lipid Profile: ?No results for input(s): CHOL, HDL, LDLCALC, TRIG, CHOLHDL, LDLDIRECT in the last 72 hours. ?Thyroid Function Tests: ?No results for input(s): TSH, T4TOTAL, FREET4, T3FREE, THYROIDAB in the last 72 hours. ?Anemia Panel: ?No results for input(s): VITAMINB12, FOLATE, FERRITIN, TIBC, IRON, RETICCTPCT in the last 72 hours. ?Sepsis Labs: ?Recent Labs  ?Lab 03/04/22 ?2128 03/04/22 ?2238 03/05/22 ?0424  ?PROCALCITON  --   --  2.32  ?LATICACIDVEN 2.6* 1.5  --   ? ? ?Recent Results (from the past 240 hour(s))  ?Blood culture (routine x 2)     Status: None  ? Collection Time: 03/04/22 10:38 PM  ? Specimen: BLOOD  ?Result Value Ref Range Status  ? Specimen Description BLOOD LEFT ASSIST CONTROL  Final  ? Special Requests   Final  ?  BOTTLES DRAWN  AEROBIC AND ANAEROBIC Blood Culture adequate volume  ? Culture   Final  ?  NO GROWTH 5 DAYS ?Performed at [facility], [address] [place], [place] [address] ?  ? Report Status 03/09/2022 FINAL  Final  ?Blood culture (routine x 2)     Status: None  ? Collection Time: 03/04/22 10:38 PM  ? Specimen: BLOOD  ?Result Value Ref Range Status  ? Specimen Description BLOOD RIGHT HAND  Final  ? Special Requests   Final  ?  BOTTLES DRAWN AEROBIC AND ANAEROBIC Blood Culture adequate volume  ? Culture   Final  ?  NO GROWTH 5 DAYS ?Performed at [facility], [address] [place], [place] [address] ?  ? Report Status 03/09/2022 FINAL  Final  ?Aerobic Culture w Gram Stain (superficial specimen)     Status: None (Preliminary result)  ? Collection Time: 03/07/22 10:45 AM  ? Specimen: Wound  ?Result Value Ref Range Status  ? Specimen Description   Final  ?  WOUND ?Performed at [facility], [address] [place],  [place] [address] ?  ? Special Requests   Final  ?  CHEST ?Performed at [facility], [address] [place], [place] [address] ?  ? Gram Stain NO WBC SEEN ?RARE GRAM POSITIVE COCCI IN PAIRS ?  Final  ? Culture   Fi

## 2022-03-10 NOTE — Progress Notes (Signed)
Mobility Specialist - Progress Note ? ? 03/10/22 1200  ?Mobility  ?Activity Ambulated independently in hallway;Stood at bedside;Dangled on edge of bed  ?Range of Motion/Exercises Right leg;Left leg  ?Level of Assistance Standby assist, set-up cues, supervision of patient - no hands on  ?Assistive Device None  ?Distance Ambulated (ft) 320 ft  ?Activity Response Tolerated well  ?$Mobility charge 1 Mobility  ? ? ? ? ?During mobility: 84 HR, BP, 93-84% SpO2 ? ?Pt semi supine upon arrival using RA. Completes bed mobility indep and ambulates indep, completes 8 stairs with supervision --- mild SOB noted after but continues ambulating. SpO2 at 84% on return but quickly elevates to 93% with PLB. Pt is left with O2 at 94% with needs in reach and RN notified. Tolerated session well. ? ?Carol [name] ?Mobility Specialist ?03/10/22, 12:12 PM ? ? ? ?

## 2022-03-10 NOTE — Progress Notes (Signed)
Upon entering pts room bed was raised out of lowest position. When I mentioned this to her she stated that she put the bed in the air. She likes her bed high as it is easier to get in and out of for her. I educated her on the risks associated with her bed being this high. She stated she was aware, and that other nurses had mentioned this to her during this stay, but that she prefers it high. Pt left with bed in raised position per pt request.  ?

## 2022-03-11 DIAGNOSIS — N179 Acute kidney failure, unspecified: Secondary | ICD-10-CM | POA: Insufficient documentation

## 2022-03-11 DIAGNOSIS — R6520 Severe sepsis without septic shock: Secondary | ICD-10-CM | POA: Diagnosis not present

## 2022-03-11 DIAGNOSIS — A419 Sepsis, unspecified organism: Secondary | ICD-10-CM | POA: Diagnosis not present

## 2022-03-11 LAB — BASIC METABOLIC PANEL
Anion gap: 8 (ref 5–15)
BUN: 18 mg/dL (ref 8–23)
CO2: 25 mmol/L (ref 22–32)
Calcium: 8.3 mg/dL — ABNORMAL LOW (ref 8.9–10.3)
Chloride: 104 mmol/L (ref 98–111)
Creatinine, Ser: 0.74 mg/dL (ref 0.44–1.00)
GFR, Estimated: >60 mL/min (ref >60)
Glucose, Bld: 95 mg/dL (ref 70–99)
Potassium: 3.2 mmol/L — ABNORMAL LOW (ref 3.5–5.1)
Sodium: 137 mmol/L (ref 135–145)

## 2022-03-11 LAB — PROTIME-INR
INR: 3 — ABNORMAL HIGH (ref 0.8–1.2)
Prothrombin Time: 30.8 seconds — ABNORMAL HIGH (ref 11.4–15.2)

## 2022-03-11 MED ORDER — WARFARIN SODIUM 1 MG PO TABS
1.0000 mg | ORAL_TABLET | Freq: Once | ORAL | Status: AC
  Administered 2022-03-11: 1 mg via ORAL
  Filled 2022-03-11: qty 1

## 2022-03-11 MED ORDER — DOXYCYCLINE HYCLATE 100 MG PO TABS
100.0000 mg | ORAL_TABLET | Freq: Two times a day (BID) | ORAL | Status: DC
  Administered 2022-03-11 – 2022-03-12 (×3): 100 mg via ORAL
  Filled 2022-03-11 (×3): qty 1

## 2022-03-11 MED ORDER — POTASSIUM CHLORIDE CRYS ER 20 MEQ PO TBCR
40.0000 meq | EXTENDED_RELEASE_TABLET | Freq: Once | ORAL | Status: AC
  Administered 2022-03-11: 40 meq via ORAL
  Filled 2022-03-11: qty 2

## 2022-03-11 NOTE — Care Management Important Message (Signed)
Important Message ? ?Patient Details  ?Name: Carol [name] ?MRN: [id number] ?Date of Birth: 1941-02-23 ? ? ?Medicare Important Message Given:  Yes ? ? ? ? ?Carol [name] A Carol [name] ?03/11/2022, 12:07 PM ?

## 2022-03-11 NOTE — Progress Notes (Signed)
?PROGRESS NOTE ? ? ? ?Carol [name]  UMP:[id number] DOB: 10/31/1941 DOA: 03/04/2022 ?PCP: Crecencio Mc, MD  ? ? ?Brief Narrative:  ?81 y.o. female with medical history significant for HFpEF, A-fib on amiodarone and Coumadin s/p ablation April 2022 and multiple cardioversions most recently 02/2022, who is s/p resection of melanoma right breast on 02/2722 who presents to the ED with increased pain, swelling and redness to the right breast in spite of doxycycline started a few days prior.  Patient also complains of generalized malaise and congestion and tested positive for COVID 5 days ago but states symptoms of congestion have been improving. ? ?Cellulitic changes have been improving.  On 5/4 patient had some drainage from surgical site.  Sutures intact, no evidence for dehiscence.  S5/8 patient remains hemodynamically stable but significant discharge noted from the wound. ? ?Assessment & Plan: ?  ?Principal Problem: ?  Severe sepsis ([place]) ?Active Problems: ?  Surgical wound infection ?  AKI (acute kidney injury) with anion gap metabolic acidosis ([place]) ?  Hyponatremia ?  Atrial fibrillation s/p ablation 2022, s/p cardioversion 02/2022 [facility]) ?  Warfarin anticoagulation ?  COVID-19 virus infection ?  (HFpEF) heart failure with preserved ejection fraction ([place]) ? ?* Severe sepsis ([place]) ?--fever, leukocytosis and elevated lactic acid, source is cellulitis ?  ?Surgical wound infection ?Patient is s/p resection of melanoma on 4/27 and reports increased drainage ?--CT chest showed Diffuse subcutaneous fat stranding throughout the right anterior chest wall and right breast, consistent with cellulitis. No evidence of fluid collection or abscess on this unenhanced exam. ?--started on vanc and cefepime on presentation ?--Drainage from surgical site noted 5/4 ?-- Superficial cultures with GPC, moderate Staph aureus ?Plan: ?Discontinue vancomycin ?Start p.o. doxycycline, appreciate pharmacy dosing assistance ?Monitor for  next 24 hours ?If cellulitic change and drainage improves anticipate discharge home 5/9 ? ?AKI (acute kidney injury) with anion gap metabolic acidosis ([place]) ?Suspect prerenal and related to sepsis ?Resolved ?  ?Hyponatremia ?Suspecting hypovolemic ?Improved with IVF. ?IVF stopped ?  ?Atrial fibrillation s/p ablation 2022, s/p cardioversion 02/2022 [facility]) ?--currently rate controlled ?Plan: ?--Continue amiodarone and warfarin ?--resume home cardizem ?-- Continue Toprol-XL ?  ?Warfarin anticoagulation ?Pharmacy to adjust warfarin for goal INR 2-3 ?  ?(HFpEF) heart failure with preserved ejection fraction ([place]) ?Compensated ?-- Continue home Lasix ?  ?COVID-19 virus infection ?Asymptomatic, no hypoxia ?Completed molnupiravir from 5/1 to 5/6.   ?No indication for respiratory isolation ?  ? ? ?DVT prophylaxis: Warfarin ?Code Status: Full ?Family Communication: Spouse Drema Eddington [phone] on 5/3.  Offered to call on 5/7.  Patient declined. ?Disposition Plan: Status is: Inpatient ?Remains inpatient appropriate because: Sepsis secondary to cellulitis.  Improving.  Tentative plan discharge home 5/9 ? ?Level of care: Med-Surg ? ?Consultants:  ?None ? ?Procedures:  ?None ? ?Antimicrobials: ?Vancomycin ? ? ?Subjective: ?Seen and examined.  Resting comfortably in bed.  No visible distress.  Cellulitic change on chest is improving.  Endorses some fatigue.  Endorses persistent drainage from the wound ? ?Objective: ?Vitals:  ? 03/10/22 1543 03/10/22 2100 03/11/22 0518 03/11/22 0739  ?BP: 121/61 133/67 118/72 (!) 146/85  ?Pulse: 63 70 73 80  ?Resp: '16 16 16 18  '$ ?Temp: 98 ?F (36.7 ?C) 97.9 ?F (36.6 ?C) 97.9 ?F (36.6 ?C) 98.2 ?F (36.8 ?C)  ?TempSrc:  Oral Oral   ?SpO2: 97% 99% 98% 100%  ?Weight:      ?Height:      ? ? ?Intake/Output Summary (Last 24 hours) at 03/11/2022 1032 ?  Last data filed at 03/10/2022 2255 ?Gross per 24 hour  ?Intake 250 ml  ?Output --  ?Net 250 ml  ? ?Filed Weights  ? 03/04/22 2118  ?Weight: 79.4 kg   ? ? ?Examination: ? ?General exam: NAD.  Sitting up on side of bed ?Respiratory system: Lungs clear.  Normal work of breathing.  Room air ?Cardiovascular system: S1-S2, RRR, no murmurs, no pedal edema ?Gastrointestinal system: Soft, NT/ND, normal bowel sounds ?Central nervous system: Alert and oriented. No focal neurological deficits. ?Extremities: Symmetric 5 x 5 power. ?Skin: Extensive cellulitic changes on the chest, greater on the right side, erythematous, warm, tender to touch.  Surgical site with serosanguineous drainage.  Less drainage arrival.  Wound appears intact.  No dehiscence. ?Psychiatry: Judgement and insight appear normal. Mood & affect appropriate.  ? ? ? ?Data Reviewed: I have personally reviewed following labs and imaging studies ? ?CBC: ?Recent Labs  ?Lab 03/04/22 ?2128 03/05/22 ?0424 03/06/22 ?0600 03/08/22 ?5573  ?WBC 20.0* 17.1* 13.5* 8.9  ?NEUTROABS  --   --   --  5.4  ?HGB 12.7 11.8* 11.0* 11.9*  ?HCT 38.4 35.1* 31.5* 34.5*  ?MCV 96.5 94.4 92.4 91.5  ?PLT 208 195 192 220  ? ?Basic Metabolic Panel: ?Recent Labs  ?Lab 03/04/22 ?2128 03/05/22 ?0424 03/06/22 ?0600 03/07/22 ?0425 03/08/22 ?2202 03/09/22 ?0503 03/11/22 ?5427  ?NA 121* 126* 134*  --  137  --  137  ?K 4.7 4.4 3.1*  --  3.0*  --  3.2*  ?CL 87* 94* 105  --  102  --  104  ?CO2 17* 22 22  --  25  --  25  ?GLUCOSE 109* 107* 113*  --  97  --  95  ?BUN 37* 35* 21  --  17  --  18  ?CREATININE 2.20* 1.85* 0.94 0.73 0.65  0.70 0.71 0.74  ?CALCIUM 8.2* 8.6* 8.2*  --  8.3*  --  8.3*  ?MG  --   --  2.0 1.8 1.8  --   --   ? ?GFR: ?Estimated Creatinine Clearance: 58.6 mL/min (by C-G formula based on SCr of 0.74 mg/dL). ?Liver Function Tests: ?No results for input(s): AST, ALT, ALKPHOS, BILITOT, PROT, ALBUMIN in the last 168 hours. ?No results for input(s): LIPASE, AMYLASE in the last 168 hours. ?No results for input(s): AMMONIA in the last 168 hours. ?Coagulation Profile: ?Recent Labs  ?Lab 03/07/22 ?0425 03/08/22 ?0623 03/09/22 ?0503  03/10/22 ?7628 03/11/22 ?3151  ?INR 2.6* 3.5* 3.2* 3.2* 3.0*  ? ?Cardiac Enzymes: ?No results for input(s): CKTOTAL, CKMB, CKMBINDEX, TROPONINI in the last 168 hours. ?BNP (last 3 results) ?Recent Labs  ?  10/15/21 ?1025 10/23/21 ?0849  ?PROBNP 1,651.0* 649.0*  ? ?HbA1C: ?No results for input(s): HGBA1C in the last 72 hours. ?CBG: ?No results for input(s): GLUCAP in the last 168 hours. ?Lipid Profile: ?No results for input(s): CHOL, HDL, LDLCALC, TRIG, CHOLHDL, LDLDIRECT in the last 72 hours. ?Thyroid Function Tests: ?No results for input(s): TSH, T4TOTAL, FREET4, T3FREE, THYROIDAB in the last 72 hours. ?Anemia Panel: ?No results for input(s): VITAMINB12, FOLATE, FERRITIN, TIBC, IRON, RETICCTPCT in the last 72 hours. ?Sepsis Labs: ?Recent Labs  ?Lab 03/04/22 ?2128 03/04/22 ?2238 03/05/22 ?0424  ?PROCALCITON  --   --  2.32  ?LATICACIDVEN 2.6* 1.5  --   ? ? ?Recent Results (from the past 240 hour(s))  ?Blood culture (routine x 2)     Status: None  ? Collection Time: 03/04/22 10:38 PM  ? Specimen: BLOOD  ?  Result Value Ref Range Status  ? Specimen Description BLOOD LEFT ASSIST CONTROL  Final  ? Special Requests   Final  ?  BOTTLES DRAWN AEROBIC AND ANAEROBIC Blood Culture adequate volume  ? Culture   Final  ?  NO GROWTH 5 DAYS ?Performed at [facility], [address] [place], [place] [address] ?  ? Report Status 03/09/2022 FINAL  Final  ?Blood culture (routine x 2)     Status: None  ? Collection Time: 03/04/22 10:38 PM  ? Specimen: BLOOD  ?Result Value Ref Range Status  ? Specimen Description BLOOD RIGHT HAND  Final  ? Special Requests   Final  ?  BOTTLES DRAWN AEROBIC AND ANAEROBIC Blood Culture adequate volume  ? Culture   Final  ?  NO GROWTH 5 DAYS ?Performed at [facility], [address] [place], [place] [address] ?  ? Report Status 03/09/2022 FINAL  Final  ?Aerobic Culture w Gram Stain (superficial specimen)     Status: None  ? Collection Time: 03/07/22 10:45 AM  ? Specimen: Wound  ?Result  Value Ref Range Status  ? Specimen Description   Final  ?  WOUND ?Performed at [facility], [address], [place],  [address] ?  ? Special Requests   Final  ?  CHEST ?Performed at [facility], 1

## 2022-03-11 NOTE — Plan of Care (Signed)
?  Problem: Education: ?Goal: Knowledge of General Education information will improve ?Description: Including pain rating scale, medication(s)/side effects and non-pharmacologic comfort measures ?Outcome: Progressing ?  ?Problem: Clinical Measurements: ?Goal: Ability to maintain clinical measurements within normal limits will improve ?Outcome: Progressing ?Goal: Cardiovascular complication will be avoided ?Outcome: Progressing ?  ?Problem: Activity: ?Goal: Risk for activity intolerance will decrease ?Outcome: Progressing ?  ?Problem: Nutrition: ?Goal: Adequate nutrition will be maintained ?Outcome: Progressing ?  ?Problem: Coping: ?Goal: Level of anxiety will decrease ?Outcome: Progressing ?  ?Problem: Elimination: ?Goal: Will not experience complications related to urinary retention ?Outcome: Progressing ?  ?Problem: Pain Managment: ?Goal: General experience of comfort will improve ?Outcome: Progressing ?  ?Problem: Safety: ?Goal: Ability to remain free from injury will improve ?Outcome: Progressing ?  ?

## 2022-03-11 NOTE — Consult Note (Signed)
ANTICOAGULATION CONSULT NOTE - Consult ? ?Pharmacy Consult for warfarin management ?Indication: atrial fibrillation ? ?Allergies  ?Allergen Reactions  ? Prednisone Palpitations  ?  A. fib  ? Pulmicort [Budesonide] Shortness Of Breath, Itching, Swelling and Other (See Comments)  ?  Felt as if things were crawly on her  ? Clonidine Other (See Comments)  ?  Pt felt like things were crawling on her arms.  ? Lotemax [Loteprednol Etabonate]   ?  Broke out around the eye  ? Morphine And Related Itching and Other (See Comments)  ?  "creepy crawly feeling"  ? Trovan [Alatrofloxacin Mesylate] Other (See Comments)  ?  "effected the whole system"  ? Zelnorm [Tegaserod Maleate] Itching  ?  Felt as if things were crawly on her ?  ? Erythromycin Rash  ? ? ?Patient Measurements: ?Height: '5\' 6"'$  (167.6 cm) ?Weight: 79.4 kg (175 lb) ?IBW/kg (Calculated) : 59.3 ?Heparin Dosing Weight:  ? ?Vital Signs: ?Temp: 98.2 ?F (36.8 ?C) (05/08 0739) ?Temp Source: Oral (05/08 0518) ?BP: 146/85 (05/08 0739) ?Pulse Rate: 80 (05/08 0739) ? ?Labs: ?Recent Labs  ?  03/09/22 ?0503 03/10/22 ?0626 03/11/22 ?0973  ?LABPROT 32.5* 32.4* 30.8*  ?INR 3.2* 3.2* 3.0*  ?CREATININE 0.71  --  0.74  ? ? ? ?Estimated Creatinine Clearance: 58.6 mL/min (by C-G formula based on SCr of 0.74 mg/dL). ? ? ?Medications:  ?VKA PTA was taking '5mg'$  on Mon/Fri  & '4mg'$  All other days of the week ? ? ?Assessment: ?81yo female HFpEF, A-fib (on amiodarone and Coumadin s/p ablation April 2022 and multiple cardioversions most recently 02/2022), & (s/p resection of melanoma right breast on 02/28/22) presenting to ED with increased pain, swelling and redness to the right breast in spite of doxycycline started a few days prior. ?   ?Goal of Therapy:  ?INR 2-3 ?Monitor platelets by anticoagulation protocol: Yes ? ?Date INR Dose ?5.1 2.8 '5mg'$  -pt reported ?5/2 3.2 '3mg'$  ?5/3 2.9 '4mg'$  ?5/4 2.6 '4mg'$  ?5/5 3.5 Hold ?5/6 3.2 '2mg'$  ?5/7 3.2 '1mg'$  ?5/8 3.0 '1mg'$  ? ?Plan:  ?INR 3.0(upper limit of therapeutic).  Will order warfarin '1mg'$  x 1.  ?Daily INR ?Weekly CBC ?DDI: amiodarone, doxycycline ? ? ?Pearla Dubonnet, PharmD ?Clinical Pharmacist  ?03/11/2022 8:26 AM  ? ? ? ?

## 2022-03-11 NOTE — Progress Notes (Signed)
Mobility Specialist - Progress Note ? ? 03/11/22 1110  ?Mobility  ?Activity Ambulated independently in hallway;Stood at bedside  ?Level of Assistance Independent  ?Assistive Device None  ?Distance Ambulated (ft) 400 ft  ?Activity Response Tolerated well  ?$Mobility charge 1 Mobility  ? ? ?Pt in bed using RA upon arrival. Completes all activities indep and voices no complaints. Returns to bed with needs in reach and alarm set. ? ?[name] ?Mobility Specialist ?03/11/22, 11:11 AM ? ? ? ? ?

## 2022-03-12 DIAGNOSIS — R6520 Severe sepsis without septic shock: Secondary | ICD-10-CM | POA: Diagnosis not present

## 2022-03-12 DIAGNOSIS — A419 Sepsis, unspecified organism: Secondary | ICD-10-CM | POA: Diagnosis not present

## 2022-03-12 LAB — PROTIME-INR
INR: 2.5 — ABNORMAL HIGH (ref 0.8–1.2)
Prothrombin Time: 26.7 seconds — ABNORMAL HIGH (ref 11.4–15.2)

## 2022-03-12 MED ORDER — WARFARIN SODIUM 2 MG PO TABS: 2.0000 mg | ORAL_TABLET | Freq: Once | ORAL | Status: DC

## 2022-03-12 MED ORDER — DOXYCYCLINE MONOHYDRATE 100 MG PO CAPS: 100.0000 mg | ORAL_CAPSULE | Freq: Two times a day (BID) | ORAL | 0 refills | Status: AC

## 2022-03-12 NOTE — Consult Note (Signed)
ANTICOAGULATION CONSULT NOTE - Consult ? ?Pharmacy Consult for warfarin management ?Indication: atrial fibrillation ? ?Allergies  ?Allergen Reactions  ? Prednisone Palpitations  ?  A. fib  ? Pulmicort [Budesonide] Shortness Of Breath, Itching, Swelling and Other (See Comments)  ?  Felt as if things were crawly on her  ? Clonidine Other (See Comments)  ?  Pt felt like things were crawling on her arms.  ? Lotemax [Loteprednol Etabonate]   ?  Broke out around the eye  ? Morphine And Related Itching and Other (See Comments)  ?  "creepy crawly feeling"  ? Trovan [Alatrofloxacin Mesylate] Other (See Comments)  ?  "effected the whole system"  ? Zelnorm [Tegaserod Maleate] Itching  ?  Felt as if things were crawly on her ?  ? Erythromycin Rash  ? ? ?Patient Measurements: ?Height: '5\' 6"'$  (167.6 cm) ?Weight: 79.4 kg (175 lb) ?IBW/kg (Calculated) : 59.3 ?Heparin Dosing Weight:  ? ?Vital Signs: ?Temp: 97.9 ?F (36.6 ?C) (05/09 0754) ?BP: 138/72 (05/09 0754) ?Pulse Rate: 63 (05/09 0754) ? ?Labs: ?Recent Labs  ?  03/10/22 ?0626 03/11/22 ?1607 03/12/22 ?3710  ?LABPROT 32.4* 30.8* 26.7*  ?INR 3.2* 3.0* 2.5*  ?CREATININE  --  0.74  --   ? ? ? ?Estimated Creatinine Clearance: 58.6 mL/min (by C-G formula based on SCr of 0.74 mg/dL). ? ? ?Medications:  ?VKA PTA was taking '5mg'$  on Mon/Fri  & '4mg'$  All other days of the week ? ? ?Assessment: ?81yo female HFpEF, A-fib (on amiodarone and Coumadin s/p ablation April 2022 and multiple cardioversions most recently 02/2022), & (s/p resection of melanoma right breast on 02/28/22) presenting to ED with increased pain, swelling and redness to the right breast in spite of doxycycline started a few days prior. ?   ?Goal of Therapy:  ?INR 2-3 ?Monitor platelets by anticoagulation protocol: Yes ? ?Date INR Dose ?5.1 2.8 '5mg'$  -pt reported ?5/2 3.2 '3mg'$  ?5/3 2.9 '4mg'$  ?5/4 2.6 '4mg'$  ?5/5 3.5 Hold ?5/6 3.2 '2mg'$  ?5/7 3.2 '1mg'$  ?5/8 3.0 '1mg'$  ?5/9 2.5  ? ?Plan:  ?INR 2.5. Will order warfarin '2mg'$  x 1 as patient is currently  on doxycycline(also on amiodarone PTA).  ?Daily INR ?Weekly CBC ?DDI: amiodarone, doxycycline ? ? ?Pearla Dubonnet, PharmD ?Clinical Pharmacist  ?03/12/2022 9:20 AM  ? ? ? ?

## 2022-03-12 NOTE — Care Management (Signed)
?  Transition of Care (TOC) Screening Note ? ? ?Patient Details  ?Name: Carol [name] ?Date of Birth: 1941-04-12 ? ? ?Transition of Care (TOC) CM/SW Contact:    ?Pete Pelt, RN ?Phone Number: ?03/12/2022, 9:41 AM ? ? ? ?Transition of Care Department [facility]) has reviewed patient and no TOC needs have been identified at this time. We will continue to monitor patient advancement through interdisciplinary progression rounds. If new patient transition needs arise, please place a TOC consult. ?  ?

## 2022-03-12 NOTE — Discharge Summary (Signed)
Physician Discharge Summary  ?Carol [name] FOY:[id number] DOB: 02/27/1941 DOA: 03/04/2022 ? ?PCP: Crecencio Mc, MD ? ?Admit date: 03/04/2022 ?Discharge date: 03/12/2022 ? ?Admitted From: Home ?Disposition:  Home ? ?Recommendations for Outpatient Follow-up:  ?Follow up with PCP in 1-2 weeks ?Follow-up with dermatology Dr. Evorn Gong on 5/10 ? ?Home Health: No ?Equipment/Devices: None ? ?Discharge Condition: Stable ?CODE STATUS: Full  ?Diet recommendation: Regular ? ?Brief/Interim Summary: ?81 y.o. female with medical history significant for HFpEF, A-fib on amiodarone and Coumadin s/p ablation April 2022 and multiple cardioversions most recently 02/2022, who is s/p resection of melanoma right breast on 02/2722 who presents to the ED with increased pain, swelling and redness to the right breast in spite of doxycycline started a few days prior.  Patient also complains of generalized malaise and congestion and tested positive for COVID 5 days ago but states symptoms of congestion have been improving. ?  ?Cellulitic changes have been improving.  On 5/4 patient had some drainage from surgical site.  Sutures intact, no evidence for dehiscence.  S5/8 patient remains hemodynamically stable but significant discharge noted from the wound. ? ?Day of discharge cellulitic change continues to improve.  There continues to be some drainage from the wound although pain and erythema have markedly improved.  At this time will transition to oral doxycycline and discharge.  Discussed care with outpatient dermatologist Dr. Evorn Gong.  Patient will follow-up in dermatology clinic on 5/10 ? ? ? ?Discharge Diagnoses:  ?Principal Problem: ?  Severe sepsis ([place]) ?Active Problems: ?  Surgical wound infection ?  AKI (acute kidney injury) with anion gap metabolic acidosis ([place]) ?  Hyponatremia ?  Atrial fibrillation s/p ablation 2022, s/p cardioversion 02/2022 [facility]) ?  Warfarin anticoagulation ?  COVID-19 virus infection ?  (HFpEF) heart failure with  preserved ejection fraction ([place]) ? ?* Severe sepsis ([place]) ?--fever, leukocytosis and elevated lactic acid, source is cellulitis sepsis physiology resolved at time of discharge ?  ?Surgical wound infection ?Patient is s/p resection of melanoma on 4/27 and reports increased drainage ?--CT chest showed Diffuse subcutaneous fat stranding throughout the right anterior chest wall and right breast, consistent with cellulitis. No evidence of fluid collection or abscess on this unenhanced exam. ?--started on vanc and cefepime on presentation ?--Drainage from surgical site noted 5/4 ?-- Superficial cultures with GPC, moderate Staph aureus ?Plan: ?Cellulitic change has been improving.  Patient tolerated transition to p.o. doxycycline.  Continued cellulitic change observed on day of discharge.  Persistent drainage from surgical wound.  Considering hemodynamic stability and overall clinical improvement will discharge home at this time.  Follow-up outpatient PCP.  Patient scheduled to follow-up with dermatology on 5/10.  7-day course of doxycycline prescribed.  Consider alternative antibiotic or extended course if clinically warranted ? ?Discharge Instructions ? ?Discharge Instructions   ? ? Diet - low sodium heart healthy   Complete by: As directed ?  ? Increase activity slowly   Complete by: As directed ?  ? No wound care   Complete by: As directed ?  ? ?  ? ?Allergies as of 03/12/2022   ? ?   Reactions  ? Prednisone Palpitations  ? A. fib  ? Pulmicort [budesonide] Shortness Of Breath, Itching, Swelling, Other (See Comments)  ? Felt as if things were crawly on her  ? Clonidine Other (See Comments)  ? Pt felt like things were crawling on her arms.  ? Lotemax [loteprednol Etabonate]   ? Broke out around the eye  ? Morphine And Related  Itching, Other (See Comments)  ? "creepy crawly feeling"  ? Trovan [alatrofloxacin Mesylate] Other (See Comments)  ? "effected the whole system"  ? Zelnorm [tegaserod Maleate] Itching  ? Felt as if  things were crawly on her  ? Erythromycin Rash  ? ?  ? ?  ?Medication List  ?  ? ?STOP taking these medications   ? ?albuterol 108 (90 Base) MCG/ACT inhaler ?Commonly known as: VENTOLIN HFA ?  ?doxycycline 100 MG capsule ?Commonly known as: VIBRAMYCIN ?  ?molnupiravir EUA 200 mg Caps capsule ?Commonly known as: LAGEVRIO ?  ? ?  ? ?TAKE these medications   ? ?acetaminophen 500 MG tablet ?Commonly known as: TYLENOL ?Take 1,000 mg by mouth every 6 (six) hours as needed for moderate pain or headache. ?  ?amiodarone 200 MG tablet ?Commonly known as: PACERONE ?Take 200 mg by mouth 2 (two) times daily. ?  ?b complex vitamins capsule ?Take 1 capsule by mouth daily. ?  ?bisacodyl 5 MG EC tablet ?Commonly known as: DULCOLAX ?Take 5 mg by mouth daily as needed for moderate constipation. ?  ?diltiazem 180 MG 24 hr capsule ?Commonly known as: DILACOR XR ?Take 180 mg by mouth daily. ?  ?diphenhydrAMINE 25 mg capsule ?Commonly known as: BENADRYL ?Take 25 mg by mouth every 6 (six) hours as needed for allergies. ?  ?docusate sodium 100 MG capsule ?Commonly known as: COLACE ?Take 100 mg by mouth daily as needed for mild constipation. ?  ?doxycycline 100 MG capsule ?Commonly known as: MONODOX ?Take 1 capsule (100 mg total) by mouth 2 (two) times daily for 7 days. ?  ?Fish Oil 1000 MG Caps ?Take 2,000 mg by mouth in the morning, at noon, and at bedtime. ?  ?furosemide 40 MG tablet ?Commonly known as: LASIX ?Take 1 tablet (40 mg total) by mouth daily. More if directed by physician ?  ?gabapentin 100 MG capsule ?Commonly known as: NEURONTIN ?Take 2 capsules (200 mg total) by mouth 3 (three) times daily. ?What changed:  ?how much to take ?when to take this ?additional instructions ?  ?GLUCOSAMINE 1500 COMPLEX PO ?Take 1 tablet by mouth 2 (two) times daily. ?  ?MAGNESIUM CITRATE PO ?Take 400 mg by mouth 2 (two) times daily. ?  ?Melatonin 5 MG Caps ?Take 5 mg by mouth at bedtime as needed (sleep). ?  ?metoprolol succinate 50 MG 24 hr  tablet ?Commonly known as: TOPROL-XL ?Take 50-100 mg by mouth See admin instructions. Take 100 mg in the morning and 50 mg at night ?  ?multivitamin with minerals Tabs tablet ?Take 0.5 tablets by mouth 2 (two) times daily. ?  ?omeprazole 20 MG capsule ?Commonly known as: PRILOSEC ?TAKE 1 CAPSULE DAILY ?  ?REFRESH DRY EYE THERAPY OP ?Place 1 drop into both eyes 2 (two) times daily as needed (dry eyes). ?  ?rosuvastatin 5 MG tablet ?Commonly known as: CRESTOR ?Take 5 mg by mouth 2 (two) times a week. Mondays and Thursdays ?  ?Vitamin D 50 MCG (2000 UT) tablet ?Take 2,000 Units by mouth daily. ?  ?vitamin E 1000 UNIT capsule ?Take 1,000 Units by mouth 2 (two) times daily. ?  ?warfarin 4 MG tablet ?Commonly known as: COUMADIN ?Take 4 mg by mouth See admin instructions. Take 4 mg daily on Sun, Tues, Wed, Thurs, and Sat ?  ?warfarin 5 MG tablet ?Commonly known as: COUMADIN ?Take 5 mg by mouth 2 (two) times a week. Monday and Friday ?  ? ?  ? ? ?Allergies  ?Allergen Reactions  ?  Prednisone Palpitations  ?  A. fib  ? Pulmicort [Budesonide] Shortness Of Breath, Itching, Swelling and Other (See Comments)  ?  Felt as if things were crawly on her  ? Clonidine Other (See Comments)  ?  Pt felt like things were crawling on her arms.  ? Lotemax [Loteprednol Etabonate]   ?  Broke out around the eye  ? Morphine And Related Itching and Other (See Comments)  ?  "creepy crawly feeling"  ? Trovan [Alatrofloxacin Mesylate] Other (See Comments)  ?  "effected the whole system"  ? Zelnorm [Tegaserod Maleate] Itching  ?  Felt as if things were crawly on her ?  ? Erythromycin Rash  ? ? ?Consultations: ?None ? ? ?Procedures/Studies: ?DG Chest 2 View ? ?Result Date: 03/04/2022 ?CLINICAL DATA:  Shortness of breath, history of recent melanoma removal from the right breast EXAM: CHEST - 2 VIEW COMPARISON:  10/23/2021 FINDINGS: Cardiac shadow is enlarged but stable. Right chest wall port is seen in satisfactory position. The lungs are well aerated  bilaterally. Elevation of the right hemidiaphragm is seen. No acute bony abnormality is noted. IMPRESSION: No acute abnormality noted. Electronically Signed   By: Inez Catalina M.D.   On: 03/04/2022 22:01  ? ?C

## 2022-03-13 ENCOUNTER — Telehealth: Payer: Self-pay | Admitting: Internal Medicine

## 2022-03-13 ENCOUNTER — Encounter: Payer: Self-pay | Admitting: Internal Medicine

## 2022-03-13 DIAGNOSIS — N179 Acute kidney failure, unspecified: Secondary | ICD-10-CM | POA: Diagnosis not present

## 2022-03-13 DIAGNOSIS — L03818 Cellulitis of other sites: Secondary | ICD-10-CM | POA: Diagnosis not present

## 2022-03-13 DIAGNOSIS — I482 Chronic atrial fibrillation, unspecified: Secondary | ICD-10-CM | POA: Diagnosis not present

## 2022-03-13 DIAGNOSIS — C439 Malignant melanoma of skin, unspecified: Secondary | ICD-10-CM | POA: Insufficient documentation

## 2022-03-13 DIAGNOSIS — E871 Hypo-osmolality and hyponatremia: Secondary | ICD-10-CM | POA: Diagnosis not present

## 2022-03-13 DIAGNOSIS — A4189 Other specified sepsis: Secondary | ICD-10-CM | POA: Diagnosis not present

## 2022-03-13 DIAGNOSIS — T8149XD Infection following a procedure, other surgical site, subsequent encounter: Secondary | ICD-10-CM | POA: Diagnosis not present

## 2022-03-13 DIAGNOSIS — I5030 Unspecified diastolic (congestive) heart failure: Secondary | ICD-10-CM | POA: Diagnosis not present

## 2022-03-13 DIAGNOSIS — U071 COVID-19: Secondary | ICD-10-CM | POA: Diagnosis not present

## 2022-03-13 NOTE — Telephone Encounter (Signed)
Manuela Schwartz From [facility] called in stating that pt was discharge from hospital... Manuela Schwartz stated that pt had lump removed from right breast... Manuela Schwartz stated that pt had sepsis... Manuela Schwartz stated the pt was discharge on medication (doxycycline (MONODOX) 100 MG capsule)... Manuela Schwartz stated that pt drainage is draining out green and fills up a lot... Manuela Schwartz requesting callback  ?

## 2022-03-13 NOTE — Telephone Encounter (Signed)
Spoke with pt's husband and he stated that they have an appt with Dr. Evorn Gong in the morning at [facility].  ?

## 2022-03-13 NOTE — Telephone Encounter (Signed)
No appt's available until Friday and they are just 15 minute appts. Is it okay to use one and two of those on Friday for pt?  ?

## 2022-03-14 ENCOUNTER — Telehealth: Payer: Self-pay

## 2022-03-14 NOTE — Telephone Encounter (Signed)
Transition Care Management Unsuccessful Follow-up Telephone Call ? ?Date of discharge and from where:  03/12/22 [facility] ? ?Patient reports being seen by Cardiology today. Labs to be drawn by Cardiology next week. Scheduled with Dermatology on 03/18/22. HH in progress.  ?Agrees to keep previously scheduled appointment with PCP- 6 month follow up on 04/15/22 and call as needed.  ?  ?

## 2022-03-18 DIAGNOSIS — U071 COVID-19: Secondary | ICD-10-CM | POA: Diagnosis not present

## 2022-03-18 DIAGNOSIS — I5030 Unspecified diastolic (congestive) heart failure: Secondary | ICD-10-CM | POA: Diagnosis not present

## 2022-03-18 DIAGNOSIS — L03818 Cellulitis of other sites: Secondary | ICD-10-CM | POA: Diagnosis not present

## 2022-03-18 DIAGNOSIS — Z7901 Long term (current) use of anticoagulants: Secondary | ICD-10-CM | POA: Diagnosis not present

## 2022-03-18 DIAGNOSIS — A4189 Other specified sepsis: Secondary | ICD-10-CM | POA: Diagnosis not present

## 2022-03-18 DIAGNOSIS — T8149XD Infection following a procedure, other surgical site, subsequent encounter: Secondary | ICD-10-CM | POA: Diagnosis not present

## 2022-03-18 DIAGNOSIS — I482 Chronic atrial fibrillation, unspecified: Secondary | ICD-10-CM | POA: Diagnosis not present

## 2022-03-26 DIAGNOSIS — T8149XD Infection following a procedure, other surgical site, subsequent encounter: Secondary | ICD-10-CM | POA: Diagnosis not present

## 2022-03-26 DIAGNOSIS — L03818 Cellulitis of other sites: Secondary | ICD-10-CM | POA: Diagnosis not present

## 2022-03-26 DIAGNOSIS — I5030 Unspecified diastolic (congestive) heart failure: Secondary | ICD-10-CM | POA: Diagnosis not present

## 2022-03-26 DIAGNOSIS — I482 Chronic atrial fibrillation, unspecified: Secondary | ICD-10-CM | POA: Diagnosis not present

## 2022-03-26 DIAGNOSIS — U071 COVID-19: Secondary | ICD-10-CM | POA: Diagnosis not present

## 2022-03-26 DIAGNOSIS — A4189 Other specified sepsis: Secondary | ICD-10-CM | POA: Diagnosis not present

## 2022-04-02 DIAGNOSIS — R791 Abnormal coagulation profile: Secondary | ICD-10-CM | POA: Diagnosis not present

## 2022-04-03 DIAGNOSIS — I480 Paroxysmal atrial fibrillation: Secondary | ICD-10-CM | POA: Diagnosis not present

## 2022-04-03 DIAGNOSIS — I10 Essential (primary) hypertension: Secondary | ICD-10-CM | POA: Diagnosis not present

## 2022-04-04 DIAGNOSIS — L089 Local infection of the skin and subcutaneous tissue, unspecified: Secondary | ICD-10-CM | POA: Diagnosis not present

## 2022-04-05 DIAGNOSIS — I482 Chronic atrial fibrillation, unspecified: Secondary | ICD-10-CM | POA: Diagnosis not present

## 2022-04-05 DIAGNOSIS — L03818 Cellulitis of other sites: Secondary | ICD-10-CM | POA: Diagnosis not present

## 2022-04-05 DIAGNOSIS — A4189 Other specified sepsis: Secondary | ICD-10-CM | POA: Diagnosis not present

## 2022-04-05 DIAGNOSIS — T8149XD Infection following a procedure, other surgical site, subsequent encounter: Secondary | ICD-10-CM | POA: Diagnosis not present

## 2022-04-05 DIAGNOSIS — I5030 Unspecified diastolic (congestive) heart failure: Secondary | ICD-10-CM | POA: Diagnosis not present

## 2022-04-05 DIAGNOSIS — U071 COVID-19: Secondary | ICD-10-CM | POA: Diagnosis not present

## 2022-04-12 DIAGNOSIS — E871 Hypo-osmolality and hyponatremia: Secondary | ICD-10-CM | POA: Diagnosis not present

## 2022-04-12 DIAGNOSIS — I5030 Unspecified diastolic (congestive) heart failure: Secondary | ICD-10-CM | POA: Diagnosis not present

## 2022-04-12 DIAGNOSIS — U071 COVID-19: Secondary | ICD-10-CM | POA: Diagnosis not present

## 2022-04-12 DIAGNOSIS — I482 Chronic atrial fibrillation, unspecified: Secondary | ICD-10-CM | POA: Diagnosis not present

## 2022-04-12 DIAGNOSIS — L03818 Cellulitis of other sites: Secondary | ICD-10-CM | POA: Diagnosis not present

## 2022-04-12 DIAGNOSIS — T8149XD Infection following a procedure, other surgical site, subsequent encounter: Secondary | ICD-10-CM | POA: Diagnosis not present

## 2022-04-12 DIAGNOSIS — N179 Acute kidney failure, unspecified: Secondary | ICD-10-CM | POA: Diagnosis not present

## 2022-04-12 DIAGNOSIS — A4189 Other specified sepsis: Secondary | ICD-10-CM | POA: Diagnosis not present

## 2022-04-15 ENCOUNTER — Ambulatory Visit: Payer: Medicare Other | Admitting: Internal Medicine

## 2022-04-17 DIAGNOSIS — R791 Abnormal coagulation profile: Secondary | ICD-10-CM | POA: Diagnosis not present

## 2022-04-19 DIAGNOSIS — L089 Local infection of the skin and subcutaneous tissue, unspecified: Secondary | ICD-10-CM | POA: Diagnosis not present

## 2022-04-23 ENCOUNTER — Other Ambulatory Visit: Payer: Self-pay

## 2022-04-23 ENCOUNTER — Inpatient Hospital Stay
Admission: EM | Admit: 2022-04-23 | Discharge: 2022-04-27 | DRG: 857 | Disposition: A | Discharge status: Home Health | Payer: Medicare Other | Attending: Hospitalist | Admitting: Hospitalist

## 2022-04-23 ENCOUNTER — Encounter: Payer: Self-pay | Admitting: Emergency Medicine

## 2022-04-23 ENCOUNTER — Emergency Department: Payer: Medicare Other

## 2022-04-23 DIAGNOSIS — N644 Mastodynia: Secondary | ICD-10-CM | POA: Diagnosis not present

## 2022-04-23 DIAGNOSIS — R9431 Abnormal electrocardiogram [ECG] [EKG]: Secondary | ICD-10-CM | POA: Diagnosis not present

## 2022-04-23 DIAGNOSIS — Z8249 Family history of ischemic heart disease and other diseases of the circulatory system: Secondary | ICD-10-CM | POA: Diagnosis not present

## 2022-04-23 DIAGNOSIS — Z83438 Family history of other disorder of lipoprotein metabolism and other lipidemia: Secondary | ICD-10-CM

## 2022-04-23 DIAGNOSIS — Z841 Family history of disorders of kidney and ureter: Secondary | ICD-10-CM

## 2022-04-23 DIAGNOSIS — Z8572 Personal history of non-Hodgkin lymphomas: Secondary | ICD-10-CM

## 2022-04-23 DIAGNOSIS — I5022 Chronic systolic (congestive) heart failure: Secondary | ICD-10-CM | POA: Diagnosis not present

## 2022-04-23 DIAGNOSIS — Z803 Family history of malignant neoplasm of breast: Secondary | ICD-10-CM

## 2022-04-23 DIAGNOSIS — E785 Hyperlipidemia, unspecified: Secondary | ICD-10-CM | POA: Diagnosis present

## 2022-04-23 DIAGNOSIS — I480 Paroxysmal atrial fibrillation: Secondary | ICD-10-CM | POA: Diagnosis present

## 2022-04-23 DIAGNOSIS — N611 Abscess of the breast and nipple: Secondary | ICD-10-CM | POA: Diagnosis not present

## 2022-04-23 DIAGNOSIS — A4901 Methicillin susceptible Staphylococcus aureus infection, unspecified site: Secondary | ICD-10-CM | POA: Diagnosis not present

## 2022-04-23 DIAGNOSIS — Z7901 Long term (current) use of anticoagulants: Secondary | ICD-10-CM | POA: Diagnosis not present

## 2022-04-23 DIAGNOSIS — I4891 Unspecified atrial fibrillation: Secondary | ICD-10-CM | POA: Diagnosis present

## 2022-04-23 DIAGNOSIS — Z9221 Personal history of antineoplastic chemotherapy: Secondary | ICD-10-CM

## 2022-04-23 DIAGNOSIS — Z853 Personal history of malignant neoplasm of breast: Secondary | ICD-10-CM

## 2022-04-23 DIAGNOSIS — Z85820 Personal history of malignant melanoma of skin: Secondary | ICD-10-CM

## 2022-04-23 DIAGNOSIS — Z79899 Other long term (current) drug therapy: Secondary | ICD-10-CM

## 2022-04-23 DIAGNOSIS — T8149XA Infection following a procedure, other surgical site, initial encounter: Secondary | ICD-10-CM | POA: Diagnosis not present

## 2022-04-23 DIAGNOSIS — E876 Hypokalemia: Secondary | ICD-10-CM | POA: Diagnosis present

## 2022-04-23 DIAGNOSIS — Y838 Other surgical procedures as the cause of abnormal reaction of the patient, or of later complication, without mention of misadventure at the time of the procedure: Secondary | ICD-10-CM | POA: Diagnosis present

## 2022-04-23 DIAGNOSIS — N61 Mastitis without abscess: Secondary | ICD-10-CM

## 2022-04-23 DIAGNOSIS — T8141XA Infection following a procedure, superficial incisional surgical site, initial encounter: Principal | ICD-10-CM | POA: Diagnosis present

## 2022-04-23 DIAGNOSIS — Z923 Personal history of irradiation: Secondary | ICD-10-CM

## 2022-04-23 LAB — APTT: aPTT: 27 seconds (ref 24–36)

## 2022-04-23 LAB — CBC
HCT: 36.9 % (ref 36.0–46.0)
Hemoglobin: 12.2 g/dL (ref 12.0–15.0)
MCH: 31.4 pg (ref 26.0–34.0)
MCHC: 33.1 g/dL (ref 30.0–36.0)
MCV: 95.1 fL (ref 80.0–100.0)
Platelets: 289 10*3/uL (ref 150–400)
RBC: 3.88 MIL/uL (ref 3.87–5.11)
RDW: 14.6 % (ref 11.5–15.5)
WBC: 7.6 10*3/uL (ref 4.0–10.5)
nRBC: 0 % (ref 0.0–0.2)

## 2022-04-23 LAB — BASIC METABOLIC PANEL
Anion gap: 9 (ref 5–15)
BUN: 15 mg/dL (ref 8–23)
CO2: 21 mmol/L — ABNORMAL LOW (ref 22–32)
Calcium: 9 mg/dL (ref 8.9–10.3)
Chloride: 103 mmol/L (ref 98–111)
Creatinine, Ser: 0.84 mg/dL (ref 0.44–1.00)
GFR, Estimated: >60 mL/min (ref >60)
Glucose, Bld: 128 mg/dL — ABNORMAL HIGH (ref 70–99)
Potassium: 4 mmol/L (ref 3.5–5.1)
Sodium: 133 mmol/L — ABNORMAL LOW (ref 135–145)

## 2022-04-23 LAB — PROTIME-INR
INR: 1.1 (ref 0.8–1.2)
Prothrombin Time: 14.3 seconds (ref 11.4–15.2)

## 2022-04-23 LAB — C-REACTIVE PROTEIN: CRP: 1.8 mg/dL — ABNORMAL HIGH (ref <1.0)

## 2022-04-23 LAB — BRAIN NATRIURETIC PEPTIDE: B Natriuretic Peptide: 337.9 pg/mL — ABNORMAL HIGH (ref 0.0–100.0)

## 2022-04-23 LAB — SEDIMENTATION RATE: Sed Rate: 57 mm/hr — ABNORMAL HIGH (ref 0–30)

## 2022-04-23 MED ORDER — VANCOMYCIN HCL 1750 MG/350ML IV SOLN
1750.0000 mg | Freq: Once | INTRAVENOUS | Status: DC
  Filled 2022-04-23: qty 350

## 2022-04-23 MED ORDER — WARFARIN SODIUM 7.5 MG PO TABS
7.5000 mg | ORAL_TABLET | Freq: Once | ORAL | Status: AC
  Administered 2022-04-23: 7.5 mg via ORAL
  Filled 2022-04-23: qty 1

## 2022-04-23 MED ORDER — ONDANSETRON HCL 4 MG/2ML IJ SOLN: 4.0000 mg | Freq: Three times a day (TID) | INTRAMUSCULAR | Status: DC | PRN

## 2022-04-23 MED ORDER — SODIUM CHLORIDE 0.9 % IV SOLN
2.0000 g | INTRAVENOUS | Status: DC
  Administered 2022-04-23: 2 g via INTRAVENOUS
  Filled 2022-04-23: qty 20

## 2022-04-23 MED ORDER — METOPROLOL SUCCINATE ER 50 MG PO TB24
50.0000 mg | ORAL_TABLET | Freq: Every day | ORAL | Status: DC
  Administered 2022-04-23 – 2022-04-26 (×4): 50 mg via ORAL
  Filled 2022-04-23 (×4): qty 1

## 2022-04-23 MED ORDER — ACETAMINOPHEN 325 MG PO TABS
650.0000 mg | ORAL_TABLET | Freq: Four times a day (QID) | ORAL | Status: DC | PRN
  Administered 2022-04-24 (×2): 650 mg via ORAL
  Filled 2022-04-23 (×2): qty 2

## 2022-04-23 MED ORDER — FISH OIL 1000 MG PO CAPS: 2000.0000 mg | ORAL_CAPSULE | Freq: Three times a day (TID) | ORAL | Status: DC

## 2022-04-23 MED ORDER — ADULT MULTIVITAMIN W/MINERALS CH
0.5000 | ORAL_TABLET | Freq: Two times a day (BID) | ORAL | Status: DC
  Administered 2022-04-24 – 2022-04-27 (×6): 0.5 via ORAL
  Filled 2022-04-23 (×6): qty 1

## 2022-04-23 MED ORDER — DIPHENHYDRAMINE HCL 25 MG PO CAPS
25.0000 mg | ORAL_CAPSULE | Freq: Four times a day (QID) | ORAL | Status: DC | PRN
  Administered 2022-04-27: 25 mg via ORAL
  Filled 2022-04-23: qty 1

## 2022-04-23 MED ORDER — FUROSEMIDE 40 MG PO TABS
40.0000 mg | ORAL_TABLET | Freq: Every day | ORAL | Status: DC
  Administered 2022-04-24 – 2022-04-27 (×4): 40 mg via ORAL
  Filled 2022-04-23 (×4): qty 1

## 2022-04-23 MED ORDER — VITAMIN D 25 MCG (1000 UNIT) PO TABS
2000.0000 [IU] | ORAL_TABLET | Freq: Every day | ORAL | Status: DC
  Administered 2022-04-24 – 2022-04-27 (×3): 2000 [IU] via ORAL
  Filled 2022-04-23 (×3): qty 2

## 2022-04-23 MED ORDER — AMIODARONE HCL 200 MG PO TABS
200.0000 mg | ORAL_TABLET | Freq: Every day | ORAL | Status: DC
  Administered 2022-04-24 – 2022-04-27 (×4): 200 mg via ORAL
  Filled 2022-04-23 (×4): qty 1

## 2022-04-23 MED ORDER — DILTIAZEM HCL ER 180 MG PO TB24
180.0000 mg | ORAL_TABLET | Freq: Every day | ORAL | Status: DC
  Administered 2022-04-23 – 2022-04-26 (×4): 180 mg via ORAL
  Filled 2022-04-23 (×5): qty 1

## 2022-04-23 MED ORDER — MELATONIN 5 MG PO TABS
5.0000 mg | ORAL_TABLET | Freq: Every evening | ORAL | Status: DC | PRN
  Administered 2022-04-24 – 2022-04-26 (×3): 5 mg via ORAL
  Filled 2022-04-23 (×3): qty 1

## 2022-04-23 MED ORDER — MAGNESIUM CITRATE PO SOLN: 1.0000 | Freq: Two times a day (BID) | ORAL | Status: DC

## 2022-04-23 MED ORDER — DILTIAZEM HCL ER 180 MG PO TB24: 180.0000 mg | ORAL_TABLET | Freq: Every day | ORAL | Status: DC

## 2022-04-23 MED ORDER — METOPROLOL SUCCINATE ER 100 MG PO TB24
100.0000 mg | ORAL_TABLET | Freq: Every day | ORAL | Status: DC
  Administered 2022-04-24 – 2022-04-25 (×2): 100 mg via ORAL
  Filled 2022-04-23 (×3): qty 1

## 2022-04-23 MED ORDER — GLUCOSAMINE 1500 COMPLEX PO CAPS: 1.0000 | ORAL_CAPSULE | Freq: Two times a day (BID) | ORAL | Status: DC

## 2022-04-23 MED ORDER — WARFARIN - PHARMACIST DOSING INPATIENT
Freq: Every day | Status: DC
  Filled 2022-04-23: qty 1

## 2022-04-23 MED ORDER — BISACODYL 5 MG PO TBEC
5.0000 mg | DELAYED_RELEASE_TABLET | Freq: Every day | ORAL | Status: DC | PRN
Start: 2022-04-23 — End: 2022-04-27

## 2022-04-23 MED ORDER — GABAPENTIN 100 MG PO CAPS
200.0000 mg | ORAL_CAPSULE | Freq: Every day | ORAL | Status: DC
  Administered 2022-04-23 – 2022-04-26 (×4): 200 mg via ORAL
  Filled 2022-04-23 (×4): qty 2

## 2022-04-23 MED ORDER — PANTOPRAZOLE SODIUM 40 MG PO TBEC
40.0000 mg | DELAYED_RELEASE_TABLET | Freq: Every day | ORAL | Status: DC
  Administered 2022-04-24 – 2022-04-27 (×4): 40 mg via ORAL
  Filled 2022-04-23 (×4): qty 1

## 2022-04-23 MED ORDER — B COMPLEX-C PO TABS
1.0000 | ORAL_TABLET | Freq: Every day | ORAL | Status: DC
  Administered 2022-04-24 – 2022-04-27 (×3): 1 via ORAL
  Filled 2022-04-23 (×4): qty 1

## 2022-04-23 MED ORDER — CEFAZOLIN SODIUM-DEXTROSE 2-4 GM/100ML-% IV SOLN
2.0000 g | Freq: Three times a day (TID) | INTRAVENOUS | Status: DC
Start: 2022-04-23 — End: 2022-04-27
  Administered 2022-04-23 – 2022-04-27 (×12): 2 g via INTRAVENOUS
  Filled 2022-04-23 (×13): qty 100

## 2022-04-23 MED ORDER — VITAMIN E 45 MG (100 UNIT) PO CAPS
1000.0000 [IU] | ORAL_CAPSULE | Freq: Two times a day (BID) | ORAL | Status: DC
Start: 2022-04-23 — End: 2022-04-24
  Administered 2022-04-23 – 2022-04-24 (×2): 1000 [IU] via ORAL
  Filled 2022-04-23 (×3): qty 10

## 2022-04-23 MED ORDER — DOCUSATE SODIUM 100 MG PO CAPS: 100.0000 mg | ORAL_CAPSULE | Freq: Every day | ORAL | Status: DC | PRN

## 2022-04-23 MED ORDER — GABAPENTIN 100 MG PO CAPS
100.0000 mg | ORAL_CAPSULE | Freq: Every day | ORAL | Status: DC
  Administered 2022-04-24 – 2022-04-27 (×3): 100 mg via ORAL
  Filled 2022-04-23 (×3): qty 1

## 2022-04-23 MED ORDER — ROSUVASTATIN CALCIUM 10 MG PO TABS
5.0000 mg | ORAL_TABLET | ORAL | Status: DC
  Filled 2022-04-23: qty 1

## 2022-04-23 NOTE — ED Triage Notes (Signed)
Pt here with burning in her right breast. Pt had a procedure about a month ago and given IV and oral abx. Abscess has white pus filled pockets and draining. Pt denies fever.

## 2022-04-23 NOTE — Consult Note (Signed)
PHARMACY -  BRIEF ANTIBIOTIC NOTE   Pharmacy has received consult(s) for Vancomycin from an ED provider.  The patient's profile has been reviewed for ht/wt/allergies/indication/available labs.    One time order(s) placed for Vancomycin '1750mg'$  IV x 1 dose.  Further antibiotics/pharmacy consults should be ordered by admitting physician if indicated.                       Thank you, Pearla Dubonnet 04/23/2022  3:30 PM

## 2022-04-23 NOTE — Assessment & Plan Note (Signed)
-   Crestor 

## 2022-04-23 NOTE — H&P (Signed)
History and Physical    Carol [name] TML:[id number] DOB: Jan 13, 1941 DOA: 04/23/2022  Referring MD/NP/PA:   PCP: Crecencio Mc, MD   Patient coming from:  The patient is coming from home.  At baseline, pt is independent for most of ADL.        Chief Complaint: right breast wound  HPI: Carol [name] is a 81 y.o. female with medical history significant of sCHF, A-fib on amiodarone and Coumadin s/p ablation April 2022 and multiple cardioversions most recently 02/2022, hyperlipidemia, mitral valve and tricuspid valve insufficiency, low-grade B-cell lymphoma, IBS, breast cancer (s/p of right lumpectomy, chemotherapy), s/p of melanoma resection, who presents with right breast wound  Patient had resection of melanoma in right breast on 02/28/22, and developed cellulitis in the right breast. She was hospitalized from 5/1 - 5/9. CT showed cellulitis without abscess formation.  She was treated with vancomycin and cefepime in hospital and discharged on Augmentin.  She took 14 days of Augmentin, no significant improvement.  Since last Friday, patient was started on Augmentin again by her dermatologist.  Still no improvement.  Patient has pain in right breast, but no fever or chills. Patient denies chest pain, cough, shortness breath.  No nausea vomiting, diarrhea or abdominal pain.  Patient is mildly constipated. Per EDP, pt had skin biopsy by dermatologist, pending results now.  Data Reviewed and ED Course: pt was found to have WBC 7.6, GFR> 60, temperature normal, blood pressure 168/80, heart rate 56, RR 18, oxygen saturation 96% on room air.  Pending right breast ultrasound.  Patient is admitted to telemetry bed as inpatient.  Consulted Dr. Delaine Lame of ID and Dr. Grayland Ormond of oncology.   EKG: I have personally reviewed.  Sinus rhythm, QTc 444, low voltage, poor R wave progression, LAD.  Review of Systems:   General: no fevers, chills, no body weight gain, fatigue HEENT: no  blurry vision, hearing changes or sore throat Respiratory: no dyspnea, coughing, wheezing CV: no chest pain, no palpitations GI: no nausea, vomiting, abdominal pain, diarrhea, constipation GU: no dysuria, burning on urination, increased urinary frequency, hematuria  Ext: has leg edema Neuro: no unilateral weakness, numbness, or tingling, no vision change or hearing loss Skin: right breast wound infection and pain MSK: No muscle spasm, no deformity, no limitation of range of movement in spin Heme: No easy bruising.  Travel history: No recent long distant travel.   Allergy:  Allergies  Allergen Reactions   Prednisone Palpitations    A. fib   Pulmicort [Budesonide] Shortness Of Breath, Itching, Swelling and Other (See Comments)    Felt as if things were crawly on her   Clonidine Other (See Comments)    Pt felt like things were crawling on her arms.   Lotemax [Loteprednol Etabonate]     Broke out around the eye   Morphine And Related Itching and Other (See Comments)    "creepy crawly feeling"   Trovan [Alatrofloxacin Mesylate] Other (See Comments)    "effected the whole system"   Zelnorm [Tegaserod Maleate] Itching    Felt as if things were crawly on her    Erythromycin Rash    Past Medical History:  Diagnosis Date   A-fib ([place])    Chronic sinusitis    Fibrocystic breast disease    History of pneumonia 1999   Hyperlipidemia    Irritable bowel syndrome    Lichen planus    lymphoma August 2013   Low grade B cell   Mild tricuspid insufficiency  Jan 2012   ECHO, [place]   Moderate mitral insufficiency JAN 2012   ECHO, [facility]    Past Surgical History:  Procedure Laterality Date   ABDOMINAL HYSTERECTOMY     precancerous cervix,     ABLATION OF DYSRHYTHMIC FOCUS  August 2013   [facility], Dr. Marcello Moores   BREAST SURGERY     bilateral, benign biopsies   CARDIOVERSION N/A 11/23/2020   Procedure: CARDIOVERSION;  Surgeon: Corey Skains, MD;  Location: [facility] ORS;  Service:  Cardiovascular;  Laterality: N/A;   CARDIOVERSION N/A 01/17/2021   Procedure: CARDIOVERSION;  Surgeon: Corey Skains, MD;  Location: [facility] ORS;  Service: Cardiovascular;  Laterality: N/A;   CARDIOVERSION N/A 01/31/2021   Procedure: CARDIOVERSION;  Surgeon: Corey Skains, MD;  Location: [facility] ORS;  Service: Cardiovascular;  Laterality: N/A;   CARDIOVERSION N/A 10/09/2021   Procedure: CARDIOVERSION;  Surgeon: Corey Skains, MD;  Location: [facility] ORS;  Service: Cardiovascular;  Laterality: N/A;   CARDIOVERSION N/A 01/17/2022   Procedure: CARDIOVERSION;  Surgeon: Corey Skains, MD;  Location: [facility] ORS;  Service: Cardiovascular;  Laterality: N/A;   MAXILLARY SINUS LIFT  1990's   [name]   oophorectomy     squamous cell removal Right    right leg calf area.   TEE WITHOUT CARDIOVERSION N/A 01/31/2021   Procedure: TRANSESOPHAGEAL ECHOCARDIOGRAM (TEE);  Surgeon: Corey Skains, MD;  Location: [facility] ORS;  Service: Cardiovascular;  Laterality: N/A;   TEE WITHOUT CARDIOVERSION N/A 10/09/2021   Procedure: TRANSESOPHAGEAL ECHOCARDIOGRAM (TEE);  Surgeon: Corey Skains, MD;  Location: [facility] ORS;  Service: Cardiovascular;  Laterality: N/A;    Social History:  reports that she has never smoked. She has never used smokeless tobacco. She reports current alcohol use of about 4.0 standard drinks of alcohol per week. She reports that she does not use drugs.  Family History:  Family History  Problem Relation Age of Onset   Cancer Mother        Breast   Hyperlipidemia Father    Heart disease Father    Hypertension Father    Kidney disease Father    Cancer Maternal Grandfather        colon CA   Diverticulitis Sister      Prior to Admission medications   Medication Sig Start Date End Date Taking? Authorizing Provider  acetaminophen (TYLENOL) 500 MG tablet Take 1,000 mg by mouth every 6 (six) hours as needed for moderate pain or headache.    [provider]  amiodarone (PACERONE)  200 MG tablet Take 200 mg by mouth 2 (two) times daily.    [provider]  b complex vitamins capsule Take 1 capsule by mouth daily.    [provider]  bisacodyl (DULCOLAX) 5 MG EC tablet Take 5 mg by mouth daily as needed for moderate constipation.    [provider]  Cholecalciferol (VITAMIN D) 50 MCG (2000 UT) tablet Take 2,000 Units by mouth daily.    [provider]  diltiazem (DILACOR XR) 180 MG 24 hr capsule Take 180 mg by mouth daily.    [provider]  diphenhydrAMINE (BENADRYL) 25 mg capsule Take 25 mg by mouth every 6 (six) hours as needed for allergies.    [provider]  docusate sodium (COLACE) 100 MG capsule Take 100 mg by mouth daily as needed for mild constipation.    [provider]  furosemide (LASIX) 40 MG tablet Take 1 tablet (40 mg total) by mouth daily. More if  directed by physician 10/15/21   Crecencio Mc, MD  gabapentin (NEURONTIN) 100 MG capsule Take 2 capsules (200 mg total) by mouth 3 (three) times daily. Patient taking differently: Take 100-200 mg by mouth See admin instructions. Patient taking 100 mg in the morning and 200 mg at night. 07/23/21   Crecencio Mc, MD  Glucosamine-Chondroit-Vit C-Mn (GLUCOSAMINE 1500 COMPLEX PO) Take 1 tablet by mouth 2 (two) times daily.    [provider]  Glycerin-Polysorbate 80 (REFRESH DRY EYE THERAPY OP) Place 1 drop into both eyes 2 (two) times daily as needed (dry eyes).    [provider]  MAGNESIUM CITRATE PO Take 400 mg by mouth 2 (two) times daily.    [provider]  Melatonin 5 MG CAPS Take 5 mg by mouth at bedtime as needed (sleep).    [provider]  metoprolol succinate (TOPROL-XL) 50 MG 24 hr tablet Take 50-100 mg by mouth See admin instructions. Take 100 mg in the morning and 50 mg at night 10/05/21   [provider]  Multiple Vitamin (MULTIVITAMIN WITH MINERALS) TABS tablet Take 0.5 tablets by mouth 2 (two)  times daily.    [provider]  Omega-3 Fatty Acids (FISH OIL) 1000 MG CAPS Take 2,000 mg by mouth in the morning, at noon, and at bedtime.    [provider]  omeprazole (PRILOSEC) 20 MG capsule TAKE 1 CAPSULE DAILY 11/06/21   Crecencio Mc, MD  rosuvastatin (CRESTOR) 5 MG tablet Take 5 mg by mouth 2 (two) times a week. Mondays and Thursdays    [provider]  vitamin E 1000 UNIT capsule Take 1,000 Units by mouth 2 (two) times daily.    [provider]  warfarin (COUMADIN) 4 MG tablet Take 4 mg by mouth See admin instructions. Take 4 mg daily on Sun, Tues, Wed, Thurs, and Sat    [provider]  warfarin (COUMADIN) 5 MG tablet Take 5 mg by mouth 2 (two) times a week. Monday and Friday    [provider]  levalbuterol [facility] HFA) 45 MCG/ACT inhaler Inhale 1-2 puffs into the lungs every 4 (four) hours as needed for wheezing. 08/13/12 08/13/12  Crecencio Mc, MD    Physical Exam: Vitals:   04/23/22 1042 04/23/22 1245 04/23/22 1503 04/23/22 1824  BP: (!) 173/92 (!) 168/80 (!) 160/78 (!) 158/80  Pulse: (!) 56 60 68 70  Resp: _0 Temp:      TempSrc:      SpO2: 95% 96% 96% 96%  Weight: 79.4 kg     Height: _1  (1.676 m)      General: Not in acute distress HEENT:       Eyes: PERRL, EOMI, no scleral icterus.       ENT: No discharge from the ears and nose, no pharynx injection, no tonsillar enlargement.        Neck: No JVD, no bruit, no mass felt. Heme: No neck lymph node enlargement. Cardiac: S1/S2, RRR, No murmurs, No gallops or rubs. Respiratory: No rales, wheezing, rhonchi or rubs. GI: Soft, nondistended, nontender, no rebound pain, no organomegaly, BS present. GU: No hematuria Ext: has trace leg edema bilaterally. 1+DP/PT pulse bilaterally. Musculoskeletal: No joint deformities, No joint redness or warmth, no limitation of ROM in spin. Skin: pt has an open wound, with surrounding erythema, with tenderness and  induration. Has several pus-looking spots, unusual looking for cellulitis.     Neuro: Alert, oriented X3, cranial  nerves II-XII grossly intact, moves all extremities normally.  Psych: Patient is not psychotic, no suicidal or hemocidal ideation.  Labs on Admission: I have personally reviewed following labs and imaging studies  CBC: Recent Labs  Lab 04/23/22 1044  WBC 7.6  HGB 12.2  HCT 36.9  MCV 95.1  PLT 242   Basic Metabolic Panel: Recent Labs  Lab 04/23/22 1044  NA 133*  K 4.0  CL 103  CO2 21*  GLUCOSE 128*  BUN 15  CREATININE 0.84  CALCIUM 9.0   GFR: Estimated Creatinine Clearance: 55.8 mL/min (by C-G formula based on SCr of 0.84 mg/dL). Liver Function Tests: No results for input(s): "AST", "ALT", "ALKPHOS", "BILITOT", "PROT", "ALBUMIN" in the last 168 hours. No results for input(s): "LIPASE", "AMYLASE" in the last 168 hours. No results for input(s): "AMMONIA" in the last 168 hours. Coagulation Profile: Recent Labs  Lab 04/23/22 1632  INR 1.1   Cardiac Enzymes: No results for input(s): "CKTOTAL", "CKMB", "CKMBINDEX", "TROPONINI" in the last 168 hours. BNP (last 3 results) Recent Labs    10/15/21 1025 10/23/21 0849  PROBNP 1,651.0* 649.0*   HbA1C: No results for input(s): "HGBA1C" in the last 72 hours. CBG: No results for input(s): "GLUCAP" in the last 168 hours. Lipid Profile: No results for input(s): "CHOL", "HDL", "LDLCALC", "TRIG", "CHOLHDL", "LDLDIRECT" in the last 72 hours. Thyroid Function Tests: No results for input(s): "TSH", "T4TOTAL", "FREET4", "T3FREE", "THYROIDAB" in the last 72 hours. Anemia Panel: No results for input(s): "VITAMINB12", "FOLATE", "FERRITIN", "TIBC", "IRON", "RETICCTPCT" in the last 72 hours. Urine analysis:    Component Value Date/Time   COLORURINE YELLOW 10/08/2021 1100   APPEARANCEUR CLEAR 10/08/2021 1100   LABSPEC 1.020 10/08/2021 1100   PHURINE 6.0 10/08/2021 1100   GLUCOSEU NEGATIVE 10/08/2021 1100   HGBUR  TRACE (A) 10/08/2021 1100   BILIRUBINUR NEGATIVE 10/08/2021 1100   KETONESUR NEGATIVE 10/08/2021 1100   PROTEINUR NEGATIVE 10/08/2021 1100   NITRITE NEGATIVE 10/08/2021 1100   LEUKOCYTESUR SMALL (A) 10/08/2021 1100   Sepsis Labs: _0 (procalcitonin:4,lacticidven:4) )No results found for this or any previous visit (from the past 240 hour(s)).   Radiological Exams on Admission: US BREAST LTD UNI RIGHT INC AXILLA  Result Date: 04/23/2022 CLINICAL DATA:  81 year old presenting with burning pain in the RIGHT breast. 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 She presents now with worsening pain. She was given a prescription for Augmentin 3 days ago. Personal history of malignant RIGHT breast lumpectomy in July, 2015 at [facility]. EXAM: ULTRASOUND OF THE RIGHT BREAST COMPARISON:  RIGHT breast ultrasound 05/18/2014 at the time of breast cancer diagnosis. No interval breast imaging at [facility]. She states that she receives her breast imaging at [facility]. FINDINGS: Targeted ultrasound is performed, showing a heterogeneous mixed solid and fluid collection in the superficial tissues at the 11:30 o'clock position 6 cm from the nipple measuring approximately 2.6 x 1.6 x 2.2 cm, demonstrating posterior acoustic enhancement and mild hyperemia on power Doppler flow. There is skin thickening/edema and there is edema in the surrounding breast tissues. IMPRESSION: Approximate 2.6 cm phlegmon in the superficial tissues of the RIGHT breast at the 11:30 o'clock position 6 cm from the nipple. This has not yet liquified into an abscess and is not a drainable collection. RECOMMENDATION: 1. Antibiotic therapy. 2. If symptoms persist, follow-up RIGHT breast ultrasound in 2 weeks after completion of antibiotic therapy.  3. Annual mammography for which the patient currently goes to [facility].  BI-RADS CATEGORY  2: Benign. Electronically Signed   By: Evangeline Dakin M.D.   On: 04/23/2022 13:28      Assessment/Plan Principal Problem:   Cellulitis of right breast Active Problems:   Chronic systolic CHF (congestive heart failure) (HCC)   Atrial fibrillation s/p ablation 2022, s/p cardioversion 02/2022 [facility])   Hyperlipidemia    Assessment and Plan: * Cellulitis of right breast Patient failed several rounds of antibiotics, currently on Augmentin.  No improvement.  Patient has right breast wound, which looks very unusual for cellulitis.  Patient is not septic now.  Other differential diagnosis includes malignancy.  Patient has history of breast cancer.  I consulted Dr. Grayland Ormond of oncology.  Also consulted Dr. Delaine Lame of infectious disease.  Per ED physician, patient's dermatologist did skin biopsy recently, pending results now.  -Admitted to telemetry bed as inpatient -Ancef IV per Dr. Gwenevere Ghazi recommendation -Blood culture and wound culture -Wound care consult -Check ESR and CRP  Chronic systolic CHF (congestive heart failure) ([place]) 2D echo on 10/09/2021 showed EF of 35- 40%.  Patient has trace leg edema, BNP is slightly elevated 337, but no shortness of breath.  Patient does not seem to have CHF exacerbation. -Continue home Lasix 40 mg daily  Atrial fibrillation s/p ablation 2022, s/p cardioversion 02/2022 ([place]) Heart rate 56 -Continue home metoprolol, amiodarone, diltiazem -Coumadin per pharma  Hyperlipidemia - Crestor          DVT ppx:   Code Status: Full code  Family Communication: I offered to call her family, but patient states that I do not need to call her family.  Husband is coming to see her.  Disposition Plan:  Anticipate discharge back to previous environment  Consults called: Consulted Dr. Delaine Lame of ID and Dr. Grayland Ormond of oncology.   Admission status and Level of care: Telemetry Medical:    as inpt      Severity of  Illness:  The appropriate patient status for this patient is INPATIENT. Inpatient status is judged to be reasonable and necessary in order to provide the required intensity of service to ensure the patient's safety. The patient's presenting symptoms, physical exam findings, and initial radiographic and laboratory data in the context of their chronic comorbidities is felt to place them at high risk for further clinical deterioration. Furthermore, it is not anticipated that the patient will be medically stable for discharge from the hospital within 2 midnights of admission.   * I certify that at the point of admission it is my clinical judgment that the patient will require inpatient hospital care spanning beyond 2 midnights from the point of admission due to high intensity of service, high risk for further deterioration and high frequency of surveillance required.*       Date of Service 04/23/2022    Ivor Costa [facility]   If 7PM-7AM, please contact night-coverage [email or web address] 04/23/2022, 6:31 PM

## 2022-04-23 NOTE — Consult Note (Signed)
CODE SEPSIS - PHARMACY COMMUNICATION  **Broad Spectrum Antibiotics should be administered within 1 hour of Sepsis diagnosis**  Time Code Sepsis Called/Page Received: 1522  Antibiotics Ordered: rocephin, vancomycin  Time of 1st antibiotic administration: 1513  Additional action taken by pharmacy: none  If necessary, Name of Provider/Nurse Contacted: n/a    Pearla Dubonnet ,PharmD Clinical Pharmacist  04/23/2022  3:43 PM

## 2022-04-23 NOTE — ED Provider Notes (Signed)
[facility] Provider Note    Event Date/Time   First MD Initiated Contact with Patient 04/23/22 1228     (approximate)   History   Abscess   HPI  Carol [name] is a 81 y.o. female with a past medical history of heart failure with preserved ejection fraction A-fib on warfarin, breast cancer status post right lumpectomy and chemotherapy, melanoma status post resection of her right breast on 02/28/2022, hyperlipidemia presents today for evaluation of abscess to her breast.  Patient reports that she was on multiple rounds of IV antibiotics while she was admitted for infection in the same location from 5/1 until 5/9.  She followed up with her dermatologist who started her on an antibiotic course that she took for 2 weeks, and then restarted another course of antibiotics 5 days ago.  Patient reports that she had a biopsy taken by her dermatologist, and she has noticed purulence from the biopsy site as well.  She has not yet received the results of the biopsy.  She reports that at the time of the anibiotic start she had a lump that she feels has ruptured overnight.  She denies any fevers, she reports intermittent chills.  I reviewed the patient's chart.  Patient presented to the emergency department initially on 02/28/2022 with pain, swelling, and redness to the right breast despite oral antibiotics.  On 5/4 she had drainage from the surgical site, and on 5/8 remained hemodynamically stable but had significant discharge from the wound.  Patient Active Problem List   Diagnosis Date Noted   Cellulitis of right breast 57/84/6962   Chronic systolic CHF (congestive heart failure) ([place]) 04/23/2022   Melanoma of skin ([place]) 03/13/2022   Severe sepsis ([place]) 03/05/2022   Warfarin anticoagulation 03/05/2022   Sepsis ([place]) 03/05/2022   (HFpEF) heart failure with preserved ejection fraction ([place]) 03/05/2022   COVID-19 virus infection 03/04/2022   AKI (acute kidney injury)  with anion gap metabolic acidosis ([place]) 95/28/4132   Transaminitis 10/16/2021   Hospital discharge follow-up 10/16/2021   A-fib ([place]) 10/08/2021   Atrial fibrillation with RVR ([place]) 10/07/2021   Bronchitis 09/21/2021   Shingles (herpes zoster) polyneuropathy 03/21/2021   CHF exacerbation ([place]) 01/29/2021   Leg edema 01/29/2021   Hyponatremia 01/29/2021   Acute cough 10/31/2020   Ill-defined cerebrovascular disease 10/12/2020   Statin intolerance 01/08/2018   Hepatic steatosis 07/23/2017   Neck pain 03/28/2017   Surgical wound infection 11/28/2016   Encounter for preventive health examination 11/30/2015   Acquired thrombophilia ([place]) 05/16/2015   Screening for colon cancer 11/07/2014   S/P TAH-BSO (total abdominal hysterectomy and bilateral salpingo-oophorectomy) 11/07/2014   History of breast cancer 06/06/2014   Medicare annual wellness visit, subsequent 05/17/2014   Lymphoma of lymph nodes of head, face, and/or neck ([place]) 08/15/2012   Malignant neoplasm of orbit ([place]) 08/12/2012   Atrial fibrillation s/p ablation 2022, s/p cardioversion 02/2022 ([place]) 44/11/270   Lichen planus    Irritable bowel syndrome    Moderate mitral insufficiency    Mild tricuspid insufficiency    Hyperlipidemia           Physical Exam   Triage Vital Signs: ED Triage Vitals  Enc Vitals Group     BP 04/23/22 1042 (!) 173/92     Pulse Rate 04/23/22 1042 (!) 56     Resp 04/23/22 1042 18     Temp 04/23/22 1041 98 F (36.7 C)     Temp Source 04/23/22 1041 Oral  SpO2 04/23/22 1042 95 %     Weight 04/23/22 1042 175 lb 0.7 oz (79.4 kg)     Height 04/23/22 1042 '5\' 6"'$  (1.676 m)     Head Circumference --      Peak Flow --      Pain Score 04/23/22 1041 7     Pain Loc --      Pain Edu? --      Excl. in [place]? --     Most recent vital signs: Vitals:   04/23/22 1245 04/23/22 1503  BP: (!) 168/80 (!) 160/78  Pulse: 60 68  Resp: 18 18  Temp:    SpO2: 96% 96%    Physical Exam Vitals and  nursing note reviewed.  Constitutional:      General: Awake and alert. No acute distress.    Appearance: Normal appearance. She is well-developed and normal weight.  HENT:     Head: Normocephalic and atraumatic.     Mouth: Mucous membranes are moist.  Eyes:     General: PERRL. Normal EOMs        Right eye: No discharge.        Left eye: No discharge.     Conjunctiva/sclera: Conjunctivae normal.  Cardiovascular:     Rate and Rhythm: Normal rate and regular rhythm.     Pulses: Normal pulses.     Heart sounds: Normal heart sounds Pulmonary:     Effort: Pulmonary effort is normal. No respiratory distress.     Breath sounds: Normal breath sounds.  Chest wall: Right breast with open wound draining purulence, with surrounding erythema extending laterally to lateral portion of breast with skin induration.  There is a second open area medial to breast that is also draining purulence.  No nipple discharge or retraction Abdominal:     Abdomen is soft. There is no abdominal tenderness. No rebound or guarding. No distention. Musculoskeletal:        General: No swelling. Normal range of motion.     Cervical back: Normal range of motion and neck supple.  Skin:    General: Skin is warm and dry.     Capillary Refill: Capillary refill takes less than 2 seconds.     Findings: No rash.  Neurological:     Mental Status: she is alert.       ED Results / Procedures / Treatments   Labs (all labs ordered are listed, but only abnormal results are displayed) Labs Reviewed  BASIC METABOLIC PANEL - Abnormal; Notable for the following components:      Result Value   Sodium 133 (*)    CO2 21 (*)    Glucose, Bld 128 (*)    All other components within normal limits  SEDIMENTATION RATE - Abnormal; Notable for the following components:   Sed Rate 57 (*)    All other components within normal limits  BRAIN NATRIURETIC PEPTIDE - Abnormal; Notable for the following components:   B Natriuretic Peptide  337.9 (*)    All other components within normal limits  AEROBIC/ANAEROBIC CULTURE W GRAM STAIN (SURGICAL/DEEP WOUND)  CBC  APTT  PROTIME-INR  C-REACTIVE PROTEIN  CBC  PROTIME-INR     EKG     RADIOLOGY IMPRESSION: Approximate 2.6 cm phlegmon in the superficial tissues of the RIGHT breast at the 11:30 o'clock position 6 cm from the nipple. This has not yet liquified into an abscess and is not a drainable collection.     PROCEDURES:  Critical Care performed:  Procedures   MEDICATIONS ORDERED IN ED: Medications  ondansetron (ZOFRAN) injection 4 mg (has no administration in time range)  acetaminophen (TYLENOL) tablet 650 mg (has no administration in time range)  amiodarone (PACERONE) tablet 200 mg (has no administration in time range)  diltiazem (CARDIZEM LA) 24 hr tablet 180 mg (has no administration in time range)  furosemide (LASIX) tablet 40 mg (has no administration in time range)  metoprolol succinate (TOPROL-XL) 24 hr tablet 100 mg (has no administration in time range)  rosuvastatin (CRESTOR) tablet 5 mg (has no administration in time range)  bisacodyl (DULCOLAX) EC tablet 5 mg (has no administration in time range)  docusate sodium (COLACE) capsule 100 mg (has no administration in time range)  melatonin tablet 5 mg (has no administration in time range)  gabapentin (NEURONTIN) capsule 100 mg (has no administration in time range)  B-complex with vitamin C tablet 1 tablet (has no administration in time range)  cholecalciferol (VITAMIN D3) tablet 2,000 Units (has no administration in time range)  multivitamin with minerals tablet 0.5 tablet (has no administration in time range)  vitamin E capsule 1,000 Units (has no administration in time range)  diphenhydrAMINE (BENADRYL) capsule 25 mg (has no administration in time range)  ceFAZolin (ANCEF) IVPB 2g/100 mL premix (2 g Intravenous New Bag/Given 04/23/22 1637)  metoprolol succinate (TOPROL-XL) 24 hr tablet 50 mg (has  no administration in time range)  gabapentin (NEURONTIN) capsule 200 mg (has no administration in time range)  warfarin (COUMADIN) tablet 7.5 mg (has no administration in time range)  Warfarin - Pharmacist Dosing Inpatient (has no administration in time range)     IMPRESSION / MDM / ASSESSMENT AND PLAN / ED COURSE  I reviewed the triage vital signs and the nursing notes.   Differential diagnosis includes, but is not limited to, abscess, phlegmon, cellulitis.  Patient is awake and alert, hemodynamically stable and afebrile.  Labs obtained in triage are overall reassuring without leukocytosis, however patient has been on multiple courses of antibiotics recently.  Ultrasound demonstrates a 2.6 cm phlegmon without drainable abscess.  Given that she has had a worsening infection despite outpatient antibiotics, recommended admission for IV antibiotics.  Patient was accepted by the hospitalist Dr. Blaine Hamper.  Patient agrees with plan for admission.  Patient was discussed with Dr. Charna Archer who agrees with assessment and plan.   Patient's presentation is most consistent with acute presentation with potential threat to life or bodily function.    FINAL CLINICAL IMPRESSION(S) / ED DIAGNOSES   Final diagnoses:  Abscess of right breast     Rx / DC Orders   ED Discharge Orders     None        Note:  This document was prepared using Dragon voice recognition software and may include unintentional dictation errors.   Emeline Gins 04/23/22 1810    Blake Divine, MD 04/24/22 Joen Laura

## 2022-04-23 NOTE — Consult Note (Signed)
[place] for Warfarin Indication: atrial fibrillation  Allergies  Allergen Reactions   Prednisone Palpitations    A. fib   Pulmicort [Budesonide] Shortness Of Breath, Itching, Swelling and Other (See Comments)    Felt as if things were crawly on her   Clonidine Other (See Comments)    Pt felt like things were crawling on her arms.   Lotemax [Loteprednol Etabonate]     Broke out around the eye   Morphine And Related Itching and Other (See Comments)    "creepy crawly feeling"   Trovan [Alatrofloxacin Mesylate] Other (See Comments)    "effected the whole system"   Zelnorm [Tegaserod Maleate] Itching    Felt as if things were crawly on her    Erythromycin Rash    Patient Measurements: Height: '5\' 6"'$  (167.6 cm) Weight: 79.4 kg (175 lb 0.7 oz) IBW/kg (Calculated) : 59.3 Heparin Dosing Weight: N/A  Vital Signs: Temp: 98 F (36.7 C) (06/20 1041) Temp Source: Oral (06/20 1041) BP: 160/78 (06/20 1503) Pulse Rate: 68 (06/20 1503)  Labs: Recent Labs    04/23/22 1044 04/23/22 1632  HGB 12.2  --   HCT 36.9  --   PLT 289  --   APTT 27  --   LABPROT  --  14.3  INR  --  1.1  CREATININE 0.84  --     Estimated Creatinine Clearance: 55.8 mL/min (by C-G formula based on SCr of 0.84 mg/dL).   Medical History: Past Medical History:  Diagnosis Date   A-fib [facility])    Chronic sinusitis    Fibrocystic breast disease    History of pneumonia 1999   Hyperlipidemia    Irritable bowel syndrome    Lichen planus    lymphoma August 2013   Low grade B cell   Mild tricuspid insufficiency Jan 2012   ECHO, [place]   Moderate mitral insufficiency JAN 2012   ECHO, [place]    Medications:  PTA dose: Warfarin '4mg'$  MWF, '5mg'$  TuThSaSu  Assessment: Pharmacy has been consulted to monitor and dose warfarin in 81yo patient with history of atrial fibrillation. Patient's last dose take was '4mg'$  on 6/19 in the evening.   Date INR Dose 6/20 1.1  Goal of  Therapy:  INR 2-3 Monitor platelets by anticoagulation protocol: Yes   Plan:  - Warfarin 7.'5mg'$ (1.5x PTA dose) x 1 ordered - will continue to adjust dose accordingly - CBC/INR daily  Rolla Kedzierski A Rosland Riding 04/23/2022,5:19 PM

## 2022-04-23 NOTE — Assessment & Plan Note (Addendum)
With phlegmon and abscesses formation --initially presented in May 2023 with cellulitis after melanoma resection.  No drainable collection at that time.  Superficial cx grew MRSA.  Pt received IV vanc/cefe followed by doxy.   --returned with persistent infection, with US showing phlegmon formation. --ID and oncology consulted on admission Plan: --cont Ancef --OR I/D today

## 2022-04-23 NOTE — ED Notes (Signed)
See triage note   presents with burning pain and drainage to right breast   states she has been on antibiotics  but thinks area is worse  afebrile on arrival

## 2022-04-23 NOTE — Assessment & Plan Note (Addendum)
2D echo on 10/09/2021 showed EF of 35- 40%.  Patient has trace leg edema, BNP is slightly elevated 337, but no shortness of breath.  Patient does not seem to have CHF exacerbation. -Continue home Lasix 40 mg daily

## 2022-04-23 NOTE — Consult Note (Signed)
Pharmacy Antibiotic Note  Carol [name] is a 81 y.o. female admitted on 04/23/2022 with cellulitis.  Pharmacy has been consulted for ancef dosing.  Plan: Ancef 2 g q8H.   Height: '5\' 6"'$  (167.6 cm) Weight: 79.4 kg (175 lb 0.7 oz) IBW/kg (Calculated) : 59.3  Temp (24hrs), Avg:98 F (36.7 C), Min:98 F (36.7 C), Max:98 F (36.7 C)  Recent Labs  Lab 04/23/22 1044  WBC 7.6  CREATININE 0.84    Estimated Creatinine Clearance: 55.8 mL/min (by C-G formula based on SCr of 0.84 mg/dL).    Allergies  Allergen Reactions   Prednisone Palpitations    A. fib   Pulmicort [Budesonide] Shortness Of Breath, Itching, Swelling and Other (See Comments)    Felt as if things were crawly on her   Clonidine Other (See Comments)    Pt felt like things were crawling on her arms.   Lotemax [Loteprednol Etabonate]     Broke out around the eye   Morphine And Related Itching and Other (See Comments)    "creepy crawly feeling"   Trovan [Alatrofloxacin Mesylate] Other (See Comments)    "effected the whole system"   Zelnorm [Tegaserod Maleate] Itching    Felt as if things were crawly on her    Erythromycin Rash    Antimicrobials this admission: 6/20 ceftriaxone x1  6/20 ancef >>   Dose adjustments this admission: None  Microbiology results: 5/4 Wound cx: [place]     Thank you for allowing pharmacy to be a part of this patient's care.  Oswald Hillock 04/23/2022 4:06 PM

## 2022-04-23 NOTE — Assessment & Plan Note (Signed)
Heart rate controlled -Continue home metoprolol, amiodarone, diltiazem -Coumadin per pharma

## 2022-04-24 DIAGNOSIS — N611 Abscess of the breast and nipple: Secondary | ICD-10-CM

## 2022-04-24 DIAGNOSIS — N61 Mastitis without abscess: Secondary | ICD-10-CM | POA: Diagnosis not present

## 2022-04-24 LAB — CBC
HCT: 33.2 % — ABNORMAL LOW (ref 36.0–46.0)
Hemoglobin: 11.2 g/dL — ABNORMAL LOW (ref 12.0–15.0)
MCH: 31.5 pg (ref 26.0–34.0)
MCHC: 33.7 g/dL (ref 30.0–36.0)
MCV: 93.3 fL (ref 80.0–100.0)
Platelets: 256 10*3/uL (ref 150–400)
RBC: 3.56 MIL/uL — ABNORMAL LOW (ref 3.87–5.11)
RDW: 14.2 % (ref 11.5–15.5)
WBC: 6 10*3/uL (ref 4.0–10.5)
nRBC: 0 % (ref 0.0–0.2)

## 2022-04-24 LAB — PROTIME-INR
INR: 1.2 (ref 0.8–1.2)
Prothrombin Time: 15.2 seconds (ref 11.4–15.2)

## 2022-04-24 MED ORDER — WARFARIN SODIUM 5 MG PO TABS
5.0000 mg | ORAL_TABLET | Freq: Once | ORAL | Status: AC
  Administered 2022-04-24: 5 mg via ORAL
  Filled 2022-04-24: qty 1

## 2022-04-24 MED ORDER — VITAMIN E 45 MG (100 UNIT) PO CAPS
1000.0000 [IU] | ORAL_CAPSULE | Freq: Two times a day (BID) | ORAL | Status: DC
  Administered 2022-04-26: 1000 [IU] via ORAL
  Filled 2022-04-24 (×4): qty 2

## 2022-04-24 MED ORDER — MEDIHONEY WOUND/BURN DRESSING EX PSTE
1.0000 | PASTE | Freq: Every day | CUTANEOUS | Status: DC
  Administered 2022-04-24 – 2022-04-27 (×2): 1 via TOPICAL
  Filled 2022-04-24: qty 1

## 2022-04-24 NOTE — Consult Note (Signed)
[place] for Warfarin Indication: atrial fibrillation  Allergies  Allergen Reactions   Prednisone Palpitations    A. fib   Pulmicort [Budesonide] Shortness Of Breath, Itching, Swelling and Other (See Comments)    Felt as if things were crawly on her   Clonidine Other (See Comments)    Pt felt like things were crawling on her arms.   Lotemax [Loteprednol Etabonate]     Broke out around the eye   Morphine And Related Itching and Other (See Comments)    "creepy crawly feeling"   Trovan [Alatrofloxacin Mesylate] Other (See Comments)    "effected the whole system"   Zelnorm [Tegaserod Maleate] Itching    Felt as if things were crawly on her    Erythromycin Rash    Patient Measurements: Height: '5\' 6"'$  (167.6 cm) Weight: 79.4 kg (175 lb 0.7 oz) IBW/kg (Calculated) : 59.3 Heparin Dosing Weight: N/A  Vital Signs: Temp: 97.7 F (36.5 C) (06/21 0221) BP: 123/64 (06/21 0221) Pulse Rate: 56 (06/21 0221)  Labs: Recent Labs    04/23/22 1044 04/23/22 1632 04/24/22 0535  HGB 12.2  --  11.2*  HCT 36.9  --  33.2*  PLT 289  --  256  APTT 27  --   --   LABPROT  --  14.3 15.2  INR  --  1.1 1.2  CREATININE 0.84  --   --      Estimated Creatinine Clearance: 55.8 mL/min (by C-G formula based on SCr of 0.84 mg/dL).   Medical History: Past Medical History:  Diagnosis Date   A-fib [facility])    Chronic sinusitis    Fibrocystic breast disease    History of pneumonia 1999   Hyperlipidemia    Irritable bowel syndrome    Lichen planus    lymphoma August 2013   Low grade B cell   Mild tricuspid insufficiency Jan 2012   ECHO, [place]   Moderate mitral insufficiency JAN 2012   ECHO, [place]    Medications:  PTA dose: Warfarin '4mg'$  MWF, '5mg'$  TuThSaSu  Assessment: Pharmacy has been consulted to monitor and dose warfarin in 81yo patient with history of atrial fibrillation sCHF, HLD, mitral valve and tricuspid valve insufficiency, low-grade  B-cell lymphoma, IBS, breast cancer (s/p of right lumpectomy, chemotherapy), s/p of melanoma resection, who presents with right breast wound. Patient's last dose take was '4mg'$  on 6/19 in the evening.   Date INR Dose 6/19 PTA '4mg'$   6/20 1.1 7.'5mg'$  6/21 1.2 '5mg'$   Goal of Therapy:  INR 2-3 Monitor platelets by anticoagulation protocol: Yes   Plan:  INR 1.1>1.2; got loading dose 6/20, but will likely begin to see this doses effect in AM 6/22.  Today would be lower dose per home schedule, but will dose Warfarin '5mg'$  x1 (6/21).  Pt has new DDI antibiotics, thin liquids diet. Will continue to adjust dose per INRs - CBC/INR daily  Lorna Dibble 04/24/2022,7:56 AM

## 2022-04-24 NOTE — Progress Notes (Signed)
  Progress Note   Patient: Carol [name] GUY:[id number] DOB: [birth year] DOA: 04/23/2022     1 DOS: the patient was seen and examined on 04/24/2022   Brief hospital course: No notes on file  Assessment and Plan: * Cellulitis of right breast With phlegmon and abscesses formation --initially presented in May 2023 with cellulitis after melanoma resection.  No drainable collection at that time.  Superficial cx grew MRSA.  Pt received IV vanc/cefe followed by doxy.   --returned with persistent infection, with US showing phlegmon formation. --ID and oncology consulted on admission Plan: --cont Ancef --consult GenSurg today  Chronic systolic CHF (congestive heart failure) ([place]) 2D echo on 10/09/2021 showed EF of 35- 40%.  Patient has trace leg edema, BNP is slightly elevated 337, but no shortness of breath.  Patient does not seem to have CHF exacerbation. -Continue home Lasix 40 mg daily  Atrial fibrillation s/p ablation 2022, s/p cardioversion 02/2022 ([place]) Heart rate controlled -Continue home metoprolol, amiodarone, diltiazem -Coumadin per pharma  Hyperlipidemia - Crestor        Subjective:  No complaints except that pt didn't sleep well last night.  GenSurg consulted today.   Physical Exam:  Constitutional: NAD, AAOx3 HEENT: conjunctivae and lids normal, EOMI CV: No cyanosis.   RESP: normal respiratory effort, on RA Neuro: II - XII grossly intact.   Psych: Normal mood and affect.  Appropriate judgement and reason   Data Reviewed:  Family Communication:   Disposition: Status is: Inpatient   Planned Discharge Destination: Home    Time spent: 50 minutes  Author: Enzo Bi, MD 04/24/2022 3:49 PM  For on call review [email or web address].

## 2022-04-24 NOTE — Progress Notes (Signed)
ID Pt is being seen by surgeon She is doing fine Oe/ awake and alert Rt breast abscess, which is firm and nodular Chest b/l air entry [id number] And soft Cns non focal  Labs    Latest Ref Rng & Units 04/24/2022    5:35 AM 04/23/2022   10:44 AM 03/08/2022    5:26 AM  CBC  WBC 4.0 - 10.5 K/uL 6.0  7.6  8.9   Hemoglobin 12.0 - 15.0 g/dL 11.2  12.2  11.9   Hematocrit 36.0 - 46.0 % 33.2  36.9  34.5   Platelets 150 - 400 K/uL 256  289  220        Latest Ref Rng & Units 04/23/2022   10:44 AM 03/11/2022    5:36 AM 03/09/2022    5:03 AM  CMP  Glucose 70 - 99 mg/dL 128  95    BUN 8 - 23 mg/dL 15  18    Creatinine 0.44 - 1.00 mg/dL 0.84  0.74  0.71   Sodium 135 - 145 mmol/L 133  137    Potassium 3.5 - 5.1 mmol/L 4.0  3.2    Chloride 98 - 111 mmol/L 103  104    CO2 22 - 32 mmol/L 21  25    Calcium 8.9 - 10.3 mg/dL 9.0  8.3       Micro Wound culture gram positive cocci  Impression /recommendation Rt breast nodular lesion with multiple purulent points - very likely this is purely infection and not underlying malignancy MSSA in the culture before and repeat has been sent On cefazolin She is going for I/D tomorrow If the lesion does not fit an infection , request tissue biopsy be sent  Recent melanoma removal from the rt chest area  H/o rt ca breast s/p lumpectomy and chemo  Discussed the management with patient, her husband and care team

## 2022-04-24 NOTE — Plan of Care (Signed)

## 2022-04-24 NOTE — Consult Note (Signed)
WOC Nurse Consult Note: Reason for Consult:Lesion of unknown origin to right upper chest.  History of breast CA and radiation to this area 20 years ago. Has an implanted port in RUQ beneath collarbone. This area is not affected by the lesion.  She has scattered scarred areas that she states come and go.  Originally, she had an open lesion to her left breast and this resolved.  Pending culture results but is adamant this is not malignant and is infectious.   Wound type: unclear etiology Pressure Injury POA: NA Measurement: 4 cmx 3.5 cm x +0.3 cm raised Wound bed:75% yellow, 25 % red, raised Drainage (amount, consistency, odor) moderate serosanguinous  Periwound:erythematous, history radiation to this area. Tender but not painful.  No warmth Dressing procedure/placement/frequency: Cleanse wound to right breast with NS and pat dry Apply Medihoney to open wound. Cover with gauze and silicone foam.  Apply daily and change foam every other day.  No aggressive tapes/adhesives to fragile chest skin.  Will not follow at this time.  Please re-consult if needed.  Domenic Moras MSN, RN, FNP-BC CWON Wound, Ostomy, Continence Nurse Pager [phone]  Conservative sharp wound debridement (CSWD performed at the bedside):

## 2022-04-24 NOTE — Consult Note (Signed)
[facility] SURGICAL CONSULTATION NOTE (initial) - cpt: [id number]   HISTORY OF PRESENT ILLNESS (HPI):  81 y.o. female presented to [facility] ED yesterday for evaluation of right breast wound. Patient reported underwent excision of right breast melanoma with Dr Evorn Gong (dermatology) on 04/27. Patient reported had swelling and erythema starting the following day. She was given doxycycline without any improvement. She was admitted from 05/01 - 05/09 for cellulitis of the right chest. Cx were taken and grew MRSA. Was given IV Vancomycin and transitioned to PO doxycycline. She was set to follow up with dermatologist, Dr Evorn Gong, on 05/10. He reportedly extended her course of Abx. She seems to have intermittently had problems with this area since that time. Apparently restarted a course of Abx 5 days ago as well. She presented to the ED yesterday secondary to this area continuing to persist despite treatment. She reports that it is still painful, swollen, and now draining some. No fever, chills reported at home. She does have a history of right breast CA (s/p of right lumpectomy, chemotherapy). Most recent labs reveal a normal WBC at 6.0. Cx taken yesterday show moderate GPC.   Surgery is consulted by hospitalist physician Dr. Enzo Bi, MD in this context for evaluation and management of right breast wound s/p excision of right breast melanoma on 04/27 with recent admission for cellulitis, now with likely abscess.   PAST MEDICAL HISTORY (PMH):  Past Medical History:  Diagnosis Date   A-fib [facility])    Chronic sinusitis    Fibrocystic breast disease    History of pneumonia 1999   Hyperlipidemia    Irritable bowel syndrome    Lichen planus    lymphoma August 2013   Low grade B cell   Mild tricuspid insufficiency Jan 2012   [facility], [place]   Moderate mitral insufficiency JAN 2012   [facility], [place]     PAST SURGICAL HISTORY [facility]):  Past Surgical History:  Procedure Laterality Date   ABDOMINAL  HYSTERECTOMY     precancerous cervix,     ABLATION OF DYSRHYTHMIC FOCUS  August 2013   [facility], Dr. Marcello Moores   BREAST SURGERY     bilateral, benign biopsies   CARDIOVERSION N/A 11/23/2020   Procedure: CARDIOVERSION;  Surgeon: Corey Skains, MD;  Location: [facility] ORS;  Service: Cardiovascular;  Laterality: N/A;   CARDIOVERSION N/A 01/17/2021   Procedure: CARDIOVERSION;  Surgeon: Corey Skains, MD;  Location: [facility] ORS;  Service: Cardiovascular;  Laterality: N/A;   CARDIOVERSION N/A 01/31/2021   Procedure: CARDIOVERSION;  Surgeon: Corey Skains, MD;  Location: [place] [facility]S;  Service: Cardiovascular;  Laterality: N/A;   CARDIOVERSION N/A 10/09/2021   Procedure: CARDIOVERSION;  Surgeon: Corey Skains, MD;  Location: [place] ORS;  Service: Cardiovascular;  Laterality: N/A;   CARDIOVERSION N/A 01/17/2022   Procedure: CARDIOVERSION;  Surgeon: Corey Skains, MD;  Location: [facility] ORS;  Service: Cardiovascular;  Laterality: N/A;   MAXILLARY SINUS LIFT  1990's   [name]   oophorectomy     squamous cell removal Right    right leg calf area.   TEE WITHOUT CARDIOVERSION N/A 01/31/2021   Procedure: TRANSESOPHAGEAL ECHOCARDIOGRAM (TEE);  Surgeon: Corey Skains, MD;  Location: [facility] ORS;  Service: Cardiovascular;  Laterality: N/A;   TEE WITHOUT CARDIOVERSION N/A 10/09/2021   Procedure: TRANSESOPHAGEAL ECHOCARDIOGRAM (TEE);  Surgeon: Corey Skains, MD;  Location: [facility] ORS;  Service: Cardiovascular;  Laterality: N/A;     MEDICATIONS:  Prior to Admission medications   Medication Sig Start  Date End Date Taking? Authorizing Provider  amiodarone (PACERONE) 200 MG tablet Take 200 mg by mouth daily.   Yes [provider]  amoxicillin-clavulanate (AUGMENTIN) 875-125 MG tablet Take 1 tablet by mouth 2 (two) times daily. 04/19/22  Yes [provider]  b complex vitamins capsule Take 1 capsule by mouth daily.   Yes [provider]  Cholecalciferol (VITAMIN D) 50 MCG (2000 UT)  tablet Take 2,000 Units by mouth daily.   Yes [provider]  diltiazem (DILACOR XR) 180 MG 24 hr capsule Take 180 mg by mouth daily.   Yes [provider]  docusate sodium (COLACE) 100 MG capsule Take 100 mg by mouth daily as needed for mild constipation.   Yes [provider]  furosemide (LASIX) 40 MG tablet Take 1 tablet (40 mg total) by mouth daily. More if directed by physician 10/15/21  Yes Crecencio Mc, MD  gabapentin (NEURONTIN) 100 MG capsule Take 2 capsules (200 mg total) by mouth 3 (three) times daily. Patient taking differently: Take 100-200 mg by mouth See admin instructions. Patient taking 100 mg in the morning and 200 mg at night. 07/23/21  Yes Crecencio Mc, MD  Glucosamine-Chondroit-Vit C-Mn (GLUCOSAMINE 1500 COMPLEX PO) Take 1 tablet by mouth 2 (two) times daily.   Yes [provider]  MAGNESIUM CITRATE PO Take 400 mg by mouth 2 (two) times daily.   Yes [provider]  metoprolol succinate (TOPROL-XL) 50 MG 24 hr tablet Take 50-100 mg by mouth See admin instructions. Take 100 mg in the morning and 50 mg at night 10/05/21  Yes [provider]  Multiple Vitamin (MULTIVITAMIN WITH MINERALS) TABS tablet Take 0.5 tablets by mouth 2 (two) times daily.   Yes [provider]  Omega-3 Fatty Acids (FISH OIL) 1000 MG CAPS Take 2,000 mg by mouth in the morning, at noon, and at bedtime.   Yes [provider]  omeprazole (PRILOSEC) 20 MG capsule TAKE 1 CAPSULE DAILY 11/06/21  Yes Crecencio Mc, MD  rosuvastatin (CRESTOR) 5 MG tablet Take 5 mg by mouth 2 (two) times a week. Mondays and Thursdays   Yes [provider]  vitamin E 1000 UNIT capsule Take 1,000 Units by mouth 2 (two) times daily.   Yes [provider]  warfarin (COUMADIN) 4 MG tablet Take 4 mg by mouth See admin instructions. Take 4 mg daily on Monday, Wednesday and Friday   Yes [provider]  acetaminophen (TYLENOL) 500 MG tablet  Take 1,000 mg by mouth every 6 (six) hours as needed for moderate pain or headache.    [provider]  bisacodyl (DULCOLAX) 5 MG EC tablet Take 5 mg by mouth daily as needed for moderate constipation.    [provider]  diphenhydrAMINE (BENADRYL) 25 mg capsule Take 25 mg by mouth every 6 (six) hours as needed for allergies.    [provider]  Glycerin-Polysorbate 80 (REFRESH DRY EYE THERAPY OP) Place 1 drop into both eyes 2 (two) times daily as needed (dry eyes).    [provider]  Melatonin 5 MG CAPS Take 5 mg by mouth at bedtime as needed (sleep).    [provider]  warfarin (COUMADIN) 5 MG tablet Take 5 mg by mouth 2 (two) times a week. Take 5 mg by mouth daily on Sunday, Tuesday, Thursday and Saturday    [provider]  levalbuterol [facility] HFA) 45 MCG/ACT inhaler Inhale 1-2 puffs into the lungs every 4 (four) hours  as needed for wheezing. 08/13/12 08/13/12  Crecencio Mc, MD     ALLERGIES:  Allergies  Allergen Reactions   Prednisone Palpitations    A. fib   Pulmicort [Budesonide] Shortness Of Breath, Itching, Swelling and Other (See Comments)    Felt as if things were crawly on her   Clonidine Other (See Comments)    Pt felt like things were crawling on her arms.   Lotemax [Loteprednol Etabonate]     Broke out around the eye   Morphine And Related Itching and Other (See Comments)    "creepy crawly feeling"   Trovan [Alatrofloxacin Mesylate] Other (See Comments)    "effected the whole system"   Zelnorm [Tegaserod Maleate] Itching    Felt as if things were crawly on her    Erythromycin Rash     SOCIAL HISTORY:  Social History   Socioeconomic History   Marital status: Married    Spouse name: [name]    Number of children: 2   Years of education: Not on file   Highest education level: Not on file  Occupational History   Not on file  Tobacco Use   Smoking status: Never   Smokeless tobacco: Never  Vaping Use    Vaping Use: Not on file  Substance and Sexual Activity   Alcohol use: Yes    Alcohol/week: 4.0 standard drinks of alcohol    Types: 4 Glasses of wine per week    Comment: Occ.   Drug use: No   Sexual activity: Not Currently    Birth control/protection: Post-menopausal  Other Topics Concern   Not on file  Social History Narrative   Lives at home with spouse    Social Determinants of Health   Financial Resource Strain: Low Risk  (09/20/2021)   Overall Financial Resource Strain (CARDIA)    Difficulty of Paying Living Expenses: Not hard at all  Food Insecurity: No Food Insecurity (09/20/2021)   Hunger Vital Sign    Worried About Running Out of Food in the Last Year: Never true    Ran Out of Food in the Last Year: Never true  Transportation Needs: No Transportation Needs (09/20/2021)   PRAPARE - Hydrologist (Medical): No    Lack of Transportation (Non-Medical): No  Physical Activity: Insufficiently Active (09/20/2021)   Exercise Vital Sign    Days of Exercise per Week: 5 days    Minutes of Exercise per Session: 20 min  Stress: No Stress Concern Present (09/20/2021)   [place]    Feeling of Stress : Not at all  Social Connections: Unknown (09/20/2021)   Social Connection and Isolation Panel [NHANES]    Frequency of Communication with Friends and Family: More than three times a week    Frequency of Social Gatherings with Friends and Family: More than three times a week    Attends Religious Services: Not on file    Active Member of Clubs or Organizations: Not on file    Attends Archivist Meetings: Not on file    Marital Status: Married  Intimate Partner Violence: Not At Risk (09/20/2021)   Humiliation, Afraid, Rape, and Kick questionnaire    Fear of Current or Ex-Partner: No    Emotionally Abused: No    Physically Abused: No    Sexually Abused: No     FAMILY HISTORY:   Family History  Problem Relation Age of Onset   Cancer Mother  Breast   Hyperlipidemia Father    Heart disease Father    Hypertension Father    Kidney disease Father    Cancer Maternal Grandfather        colon CA   Diverticulitis Sister       REVIEW OF SYSTEMS:  Review of Systems  Constitutional:  Negative for chills and fever.  HENT:  Negative for congestion and sore throat.   Respiratory:  Negative for cough and shortness of breath.   Cardiovascular:  Negative for chest pain and palpitations.  Gastrointestinal:  Negative for nausea and vomiting.  Skin:        + Right Breast Abscess   All other systems reviewed and are negative.   VITAL SIGNS:  Temp:  [97.7 F (36.5 C)-98.1 F (36.7 C)] 97.8 F (36.6 C) (06/21 0815) Pulse Rate:  [56-70] 61 (06/21 0815) Resp:  [16-18] 18 (06/21 0815) BP: (123-160)/(64-89) 146/76 (06/21 0815) SpO2:  [93 %-99 %] 93 % (06/21 0815)     Height: '5\' 6"'$  (167.6 cm) Weight: 79.4 kg BMI (Calculated): 28.27   INTAKE/OUTPUT:  06/20 0701 - 06/21 0700 In: 200 [IV Piggyback:200] Out: -   PHYSICAL EXAM:  Physical Exam Vitals and nursing note reviewed. Exam conducted with a chaperone present.  Constitutional:      General: She is not in acute distress.    Appearance: Normal appearance. She is normal weight. She is not ill-appearing.  HENT:     Head: Normocephalic and atraumatic.  Eyes:     General: No scleral icterus.    Conjunctiva/sclera: Conjunctivae normal.  Pulmonary:     Effort: Pulmonary effort is normal. No respiratory distress.  Chest:       Comments: To approximately the 11-12 o'clock position of the right breast, upper outer quadrant, there is an area of swelling, tenderness, and erythema. There appears to be areas of overlaying skin necrosis with punctate areas of purulence, which is expressible. Area is very tender.  Genitourinary:    Comments: Deferred Musculoskeletal:     Right lower leg: No edema.     Left lower  leg: No edema.  Skin:    General: Skin is warm and dry.     Findings: Abscess and erythema present.  Neurological:     General: No focal deficit present.     Mental Status: She is alert and oriented to person, place, and time.  Psychiatric:        Mood and Affect: Mood normal.        Behavior: Behavior normal.      Labs:     Latest Ref Rng & Units 04/24/2022    5:35 AM 04/23/2022   10:44 AM 03/08/2022    5:26 AM  CBC  WBC 4.0 - 10.5 K/uL 6.0  7.6  8.9   Hemoglobin 12.0 - 15.0 g/dL 11.2  12.2  11.9   Hematocrit 36.0 - 46.0 % 33.2  36.9  34.5   Platelets 150 - 400 K/uL 256  289  220       Latest Ref Rng & Units 04/23/2022   10:44 AM 03/11/2022    5:36 AM 03/09/2022    5:03 AM  CMP  Glucose 70 - 99 mg/dL 128  95    BUN 8 - 23 mg/dL 15  18    Creatinine 0.44 - 1.00 mg/dL 0.84  0.74  0.71   Sodium 135 - 145 mmol/L 133  137    Potassium 3.5 - 5.1 mmol/L 4.0  3.2  Chloride 98 - 111 mmol/L 103  104    CO2 22 - 32 mmol/L 21  25    Calcium 8.9 - 10.3 mg/dL 9.0  8.3       Imaging studies:   Right Breast US (04/23/2022) personally reviewed and agree with radiologist report reviewed below IMPRESSION: Approximate 2.6 cm phlegmon in the superficial tissues of the RIGHT breast at the 11:30 o'clock position 6 cm from the nipple. This has not yet liquified into an abscess and is not a drainable collection.   Assessment/Plan: (ICD-10's: N61.1) 81 y.o. female with history of recent right breast celullitis which now appears to have consolidated into an abscess   - I do feel that this area on the right breast has consolidated into an abscess., which will benefit from I&D. I did offer this at bedside; however, patient would prefer this done in the OR if possible given concern for ability to tolerate procedure. I feel this is reasonable. Timing per Dr Forest Becker availability.   - All risks, benefits, and alternatives to above procedure(s) were discussed with the patient, all of her  questions were answered to her expressed satisfaction, patient expresses she wishes to proceed, and informed consent was obtained.    - NPO at midnight    - Continue IV Abx   - Pain control prn  - Further management per primary service; we will follow   All of the above findings and recommendations were discussed with the patient, and all of patient's questions were answered to her expressed satisfaction.  Thank you for the opportunity to participate in this patient's care.   -- Edison Simon, PA-C [facility] 04/24/2022, 1:01 PM M-F: 7am - 4pm

## 2022-04-24 NOTE — Consult Note (Addendum)
NAME: Carol [name]  DOB: [birth year]  MRN: [id number]  Date/Time: 04/23/22 1830  Pt seen on 04/23/22 at 1830- Note entered 04/24/22  REQUESTING PROVIDER: NIU Subjective:  REASON FOR CONSULT: rt breast infection ? Carol [name] is a 81 y.o. female with a history of rt breast ca s/p lumpectomy and chemo, lymphoma ( low grade B cell) A-fib status post ablation and multiple cardioversions on Coumadin recent melanoma excision from the skin on the rt breast presents with right chest wound on 04/23/2022  Pt underwent resection of the melanoma on the right chest area on 02/28/2022.  She states she had a reaction to the tape and develops what was cellulitis.  She was hospitalized between 03/04/22-03/12/22.  CT chest showed cellulitis without abscess formation.  She was treated initially with vancomycin and cefepime in the hospital.  Culture done from a wound that time was Staph aureus.  She was discharged on Augmentin for 14 days and she took it without any improvement.  She had seen her dermatologist Dr. Evorn Gong last Friday and he had continued the Augmentin. Patient now has a wound that is discharging pus She does not have any fever or chills No nausea or vomiting or diarrhea. In the ED temperature was 98.1, BP 152/89, heart rate 59 and sats 99%. Labs revealed WBC of 7.6, Hb 12.2, platelet 289 and creatinine 0.84. I am asked to see the patient for the same    Past Medical History:  Diagnosis Date   A-fib [facility])    Chronic sinusitis    Fibrocystic breast disease    History of pneumonia 1999   Hyperlipidemia    Irritable bowel syndrome    Lichen planus    lymphoma August 2013   Low grade B cell   Mild tricuspid insufficiency Jan 2012   [facility], [place]   Moderate mitral insufficiency JAN 2012   [facility], [place]    Past Surgical History:  Procedure Laterality Date   ABDOMINAL HYSTERECTOMY     precancerous cervix,     ABLATION OF DYSRHYTHMIC FOCUS  August 2013   [facility], Dr. Marcello Moores    BREAST SURGERY     bilateral, benign biopsies   CARDIOVERSION N/A 11/23/2020   Procedure: CARDIOVERSION;  Surgeon: Corey Skains, MD;  Location: [facility] ORS;  Service: Cardiovascular;  Laterality: N/A;   CARDIOVERSION N/A 01/17/2021   Procedure: CARDIOVERSION;  Surgeon: Corey Skains, MD;  Location: [facility] OR[facility];  Service: Cardiovascular;  Laterality: N/A;   CARDIOVERSION N/A 01/31/2021   Procedure: CARDIOVERSION;  Surgeon: Corey Skains, MD;  Location: [place] OR[facility];  Service: Cardiovascular;  Laterality: N/A;   CARDIOVERSION N/A 10/09/2021   Procedure: CARDIOVERSION;  Surgeon: Corey Skains, MD;  Location: [facility];  Service: Cardiovascular;  Laterality: N/A;   CARDIOVERSION N/A 01/17/2022   Procedure: CARDIOVERSION;  Surgeon: Corey Skains, MD;  Location: [facility];  Service: Cardiovascular;  Laterality: N/A;   MAXILLARY SINUS LIFT  1990's   [name]   oophorectomy     squamous cell removal Right    right leg calf area.   TEE WITHOUT CARDIOVERSION N/A 01/31/2021   Procedure: TRANSESOPHAGEAL ECHOCARDIOGRAM (TEE);  Surgeon: Corey Skains, MD;  Location: [facility] OR[facility];  Service: Cardiovascular;  Laterality: N/A;   TEE WITHOUT CARDIOVERSION N/A 10/09/2021   Procedure: TRANSESOPHAGEAL ECHOCARDIOGRAM (TEE);  Surgeon: Corey Skains, MD;  Location: [facility] OR[facility];  Service: Cardiovascular;  Laterality: N/A;    Social History   Socioeconomic History   Marital status: Married  Spouse name: [name]    Number of children: 2   Years of education: Not on file   Highest education level: Not on file  Occupational History   Not on file  Tobacco Use   Smoking status: Never   Smokeless tobacco: Never  Vaping Use   Vaping Use: Not on file  Substance and Sexual Activity   Alcohol use: Yes    Alcohol/week: 4.0 standard drinks of alcohol    Types: 4 Glasses of wine per week    Comment: Occ.   Drug use: No   Sexual activity: Not Currently    Birth control/protection: Post-menopausal  Other  Topics Concern   Not on file  Social History Narrative   Lives at home with spouse    Social Determinants of Health   Financial Resource Strain: Low Risk  (09/20/2021)   Overall Financial Resource Strain (CARDIA)    Difficulty of Paying Living Expenses: Not hard at all  Food Insecurity: No Food Insecurity (09/20/2021)   Hunger Vital Sign    Worried About Running Out of Food in the Last Year: Never true    Ran Out of Food in the Last Year: Never true  Transportation Needs: No Transportation Needs (09/20/2021)   PRAPARE - Hydrologist (Medical): No    Lack of Transportation (Non-Medical): No  Physical Activity: Insufficiently Active (09/20/2021)   Exercise Vital Sign    Days of Exercise per Week: 5 days    Minutes of Exercise per Session: 20 min  Stress: No Stress Concern Present (09/20/2021)   [place]    Feeling of Stress : Not at all  Social Connections: Unknown (09/20/2021)   Social Connection and Isolation Panel [NHANES]    Frequency of Communication with Friends and Family: More than three times a week    Frequency of Social Gatherings with Friends and Family: More than three times a week    Attends Religious Services: Not on file    Active Member of [place] or Organizations: Not on file    Attends Archivist Meetings: Not on file    Marital Status: Married  Intimate Partner Violence: Not At Risk (09/20/2021)   Humiliation, Afraid, Rape, and Kick questionnaire    Fear of Current or Ex-Partner: No    Emotionally Abused: No    Physically Abused: No    Sexually Abused: No    Family History  Problem Relation Age of Onset   Cancer Mother        Breast   Hyperlipidemia Father    Heart disease Father    Hypertension Father    Kidney disease Father    Cancer Maternal Grandfather        colon CA   Diverticulitis Sister    Allergies  Allergen Reactions   Prednisone  Palpitations    A. fib   Pulmicort [Budesonide] Shortness Of Breath, Itching, Swelling and Other (See Comments)    Felt as if things were crawly on her   Clonidine Other (See Comments)    Pt felt like things were crawling on her arms.   Lotemax [Loteprednol Etabonate]     Broke out around the eye   Morphine And Related Itching and Other (See Comments)    "creepy crawly feeling"   Trovan [Alatrofloxacin Mesylate] Other (See Comments)    "effected the whole system"   Zelnorm [Tegaserod Maleate] Itching    Felt as if things were crawly  on her    Erythromycin Rash   I? Current Facility-Administered Medications  Medication Dose Route Frequency Provider Last Rate Last Admin   acetaminophen (TYLENOL) tablet 650 mg  650 mg Oral Q6H PRN Ivor Costa, MD   650 mg at 04/24/22 0044   amiodarone (PACERONE) tablet 200 mg  200 mg Oral Daily Ivor Costa, MD   200 mg at 04/24/22 4010   B-complex with vitamin C tablet 1 tablet  1 tablet Oral Daily Ivor Costa, MD   1 tablet at 04/24/22 0853   bisacodyl (DULCOLAX) EC tablet 5 mg  5 mg Oral Daily PRN Ivor Costa, MD       ceFAZolin (ANCEF) IVPB 2g/100 mL premix  2 g Intravenous Q8H Oswald Hillock, [place]   Stopped at 04/24/22 0848   cholecalciferol (VITAMIN D3) tablet 2,000 Units  2,000 Units Oral Daily Ivor Costa, MD   2,000 Units at 04/24/22 0851   diltiazem (CARDIZEM LA) 24 hr tablet 180 mg  180 mg Oral QHS Foust, Katy L, NP   180 mg at 04/23/22 2218   diphenhydrAMINE (BENADRYL) capsule 25 mg  25 mg Oral Q6H PRN Ivor Costa, MD       docusate sodium (COLACE) capsule 100 mg  100 mg Oral Daily PRN Ivor Costa, MD       furosemide (LASIX) tablet 40 mg  40 mg Oral Daily Ivor Costa, MD   40 mg at 04/24/22 0851   gabapentin (NEURONTIN) capsule 100 mg  100 mg Oral Daily Ivor Costa, MD   100 mg at 04/24/22 0851   gabapentin (NEURONTIN) capsule 200 mg  200 mg Oral QHS Ivor Costa, MD   200 mg at 04/23/22 2217   melatonin tablet 5 mg  5 mg Oral QHS PRN Ivor Costa, MD        metoprolol succinate (TOPROL-XL) 24 hr tablet 100 mg  100 mg Oral Daily Ivor Costa, MD   100 mg at 04/24/22 0853   metoprolol succinate (TOPROL-XL) 24 hr tablet 50 mg  50 mg Oral QHS Ivor Costa, MD   50 mg at 04/23/22 2218   multivitamin with minerals tablet 0.5 tablet  0.5 tablet Oral BID Ivor Costa, MD   0.5 tablet at 04/24/22 0852   ondansetron (ZOFRAN) injection 4 mg  4 mg Intravenous Q8H PRN Ivor Costa, MD       pantoprazole (PROTONIX) EC tablet 40 mg  40 mg Oral Daily Ivor Costa, MD   40 mg at 04/24/22 0851   [START ON 04/25/2022] rosuvastatin (CRESTOR) tablet 5 mg  5 mg Oral Once per day on Mon Thu Niu, Xilin, MD       vitamin E capsule 1,000 Units  1,000 Units Oral BID Ivor Costa, MD   1,000 Units at 04/24/22 2725   warfarin (COUMADIN) tablet 5 mg  5 mg Oral ONCE-1600 Beers, Shanon Brow, [facility]       Warfarin - Pharmacist Dosing Inpatient   Does not apply q1600 Pearla Dubonnet, RPH         Abtx:  Anti-infectives (From admission, onward)    Start     Dose/Rate Route Frequency Ordered Stop   04/23/22 1700  ceFAZolin (ANCEF) IVPB 2g/100 mL premix        2 g 200 mL/hr over 30 Minutes Intravenous Every 8 hours 04/23/22 1608     04/23/22 1530  vancomycin (VANCOREADY) IVPB 1750 mg/350 mL  Status:  Discontinued        1,750 mg 175 mL/hr  over 120 Minutes Intravenous  Once 04/23/22 1529 04/23/22 1558   04/23/22 1500  cefTRIAXone (ROCEPHIN) 2 g in sodium chloride 0.9 % 100 mL IVPB  Status:  Discontinued        2 g 200 mL/hr over 30 Minutes Intravenous Every 24 hours 04/23/22 1454 04/23/22 1558       REVIEW OF SYSTEMS:  Const: negative fever, negative chills, negative weight loss Eyes: negative diplopia or visual changes, negative eye pain ENT: negative coryza, negative sore throat Resp: negative cough, hemoptysis, dyspnea Cards: negative for chest pain, palpitations, lower extremity edema GU: negative for frequency, dysuria and hematuria GI: Negative for abdominal pain, diarrhea,  bleeding, constipation Skin: as above Heme: negative for easy bruising and gum/nose bleeding MS: negative for myalgias, arthralgias, back pain and muscle weakness Neurolo:negative for headaches, dizziness, vertigo, memory problems  Psych: negative for feelings of anxiety, depression  Endocrine: negative for thyroid, diabetes Allergy/Immunology- as above ? Objective:  VITALS:  BP (!) 146/76 (BP Location: Right Arm)   Pulse 61   Temp 97.8 F (36.6 C)   Resp 18   Ht '5\' 6"'$  (1.676 m)   Wt 79.4 kg   SpO2 93%   BMI 28.25 kg/m   PHYSICAL EXAM:  General: Alert, cooperative, no distress, appears stated age.  Head: Normocephalic, without obvious abnormality, atraumatic. Eyes: Conjunctivae clear, anicteric sclerae. Pupils are equal ENT Nares normal. No drainage or sinus tenderness. Lips, mucosa, and tongue normal. No Thrush Neck: Supple, symmetrical, no adenopathy, thyroid: non tender no carotid bruit and no JVD. Back: No CVA tenderness. Lungs: Clear to auscultation bilaterally. No Wheezing or Rhonchi. No rales. Heart: Regular rate and rhythm, no murmur, rub or gallop. Port in place Rt breast area 04/23/22    03/05/22   Abdomen: Soft, non-tender,not distended. Bowel sounds normal. No masses Extremities: atraumatic, no cyanosis. No edema. No clubbing Skin: No rashes or lesions. Or bruising Lymph: Cervical, supraclavicular normal. Neurologic: Grossly non-focal Pertinent Labs Lab Results CBC    Component Value Date/Time   WBC 6.0 04/24/2022 0535   RBC 3.56 (L) 04/24/2022 0535   HGB 11.2 (L) 04/24/2022 0535   HCT 33.2 (L) 04/24/2022 0535   PLT 256 04/24/2022 0535   MCV 93.3 04/24/2022 0535   MCH 31.5 04/24/2022 0535   MCHC 33.7 04/24/2022 0535   RDW 14.2 04/24/2022 0535   LYMPHSABS 1.7 03/08/2022 0526   MONOABS 1.1 (H) 03/08/2022 0526   EOSABS 0.2 03/08/2022 0526   BASOSABS 0.1 03/08/2022 0526       Latest Ref Rng & Units 04/23/2022   10:44 AM 03/11/2022    5:36 AM  03/09/2022    5:03 AM  CMP  Glucose 70 - 99 mg/dL 128  95    BUN 8 - 23 mg/dL 15  18    Creatinine 0.44 - 1.00 mg/dL 0.84  0.74  0.71   Sodium 135 - 145 mmol/L 133  137    Potassium 3.5 - 5.1 mmol/L 4.0  3.2    Chloride 98 - 111 mmol/L 103  104    CO2 22 - 32 mmol/L 21  25    Calcium 8.9 - 10.3 mg/dL 9.0  8.3        Microbiology: Recent Results (from the past 240 hour(s))  Aerobic/Anaerobic Culture w Gram Stain (surgical/deep wound)     Status: None (Preliminary result)   Collection Time: 04/23/22  4:31 PM   Specimen: Wound  Result Value Ref Range Status   Specimen Description  Final    WOUND Performed at [facility], [address] [place], [place] [address]    Special Requests   Final    RT BREAST Performed at [facility], [address] [place], [place] [address]    Gram Stain   Final    NO WBC SEEN MODERATE GRAM POSITIVE COCCI IN PAIRS Performed at [facility], [place] [address], [place], [place] [address]    Culture PENDING  Incomplete   Report Status PENDING  Incomplete  Aerobic/Anaerobic Culture w Gram Stain (surgical/deep wound)     Status: None (Preliminary result)   Collection Time: 04/23/22  7:57 PM   Specimen: Wound  Result Value Ref Range Status   Specimen Description   Final    WOUND RIGHT BREAST Performed at [facility], [address] [place], [place] [address]    Special Requests   Final    Normal Performed at [facility], [address] [place], [place] [address]    Gram Stain   Final    NO SQUAMOUS EPITHELIAL CELLS SEEN FEW WBC SEEN MODERATE GRAM POSITIVE COCCI    Culture   Final    TOO YOUNG TO READ Performed at [facility], [place] [address], [place], [place] [address]    Report Status PENDING  Incomplete  Culture, blood (Routine X 2) w Reflex to ID Panel     Status: None (Preliminary result)   Collection Time: 04/23/22 10:01 PM   Specimen: BLOOD  Result Value Ref Range Status    Specimen Description BLOOD RIGHT ASSIST CONTROL  Final   Special Requests   Final    BOTTLES DRAWN AEROBIC AND ANAEROBIC Blood Culture adequate volume   Culture   Final    NO GROWTH < 12 HOURS Performed at [facility], [address] [place], [place] [address]    Report Status PENDING  Incomplete  Culture, blood (Routine X 2) w Reflex to ID Panel     Status: None (Preliminary result)   Collection Time: 04/23/22 10:10 PM   Specimen: BLOOD  Result Value Ref Range Status   Specimen Description BLOOD RIGHT HAND  Final   Special Requests   Final    BOTTLES DRAWN AEROBIC AND ANAEROBIC Blood Culture adequate volume   Culture   Final    NO GROWTH < 12 HOURS Performed at [facility], [address] [place], [place] [address]    Report Status PENDING  Incomplete    IMAGING RESULTS: Ultrasound right chest 2.6 cm phlegmon in the superficial tissue of the right breast I have personally reviewed the films ? Impression/Recommendation 81 year old female with history of right CA breast status postlumpectomy and chemo and low-grade B-cell lymphoma and recent right chest melanoma excision on 02/28/2022 has been having redness and swelling and now a wound on that area since 03/04/2022 Has not responded to multiple courses of antibiotics Staph aureus cultured on 03/07/2022 Patient now has a nodular lesion of 4 cm below the surgical scar and this has multiple purulent points Concern for abscess versus malignancy.  Would recommend surgery consult We will send another culture and put her on cefazolin instead of Vanco and ceftriaxone   A-fib status post cardioversion.  Now in sinus rhythm.   On amiodarone, diltiazem and metoprolol.  Also on Coumadin.  Hyperlipidemia on rosuvastatin? ___________________________________________________ Discussed with patient, requesting provider Note:  This document was prepared using Dragon voice recognition software and may include unintentional  dictation  errors.

## 2022-04-24 NOTE — Consult Note (Signed)
[place]  [phone] [phone]  ID: Cardell [name] OB: May 05, 1941  MR#: [id number]  QIO#:[id number]  Patient Care Team: Carol Mc, MD as PCP - General (Internal Medicine) Carol Skains, MD as Referring Physician (Internal Medicine)  CHIEF COMPLAINT: Chest wall infection, history of melanoma and breast cancer.  INTERVAL HISTORY: Patient is an 81 year old Carol [name] who recently had a melanoma removed from her chest wall.  She is admitted the hospital with significant cellulitis and possible chest wall abscess.  She has a history of breast cancer as well, but nearly 20 years ago.  She otherwise feels well.  She has no neurologic complaints.  She denies any recent fevers or illnesses.  She has a good appetite and denies weight loss.  She has no chest pain, shortness of breath, cough, or hemoptysis.  She denies any nausea, vomiting, constipation, or diarrhea.  She has no urinary complaints.  Patient otherwise feels well and offers no further specific complaints today.  REVIEW OF SYSTEMS:   Review of Systems  Constitutional: Negative.  Negative for fever, malaise/fatigue and weight loss.  Respiratory: Negative.  Negative for cough, hemoptysis and wheezing.   Cardiovascular: Negative.  Negative for chest pain and leg swelling.  Gastrointestinal: Negative.  Negative for abdominal pain.  Genitourinary: Negative.  Negative for dysuria.  Musculoskeletal: Negative.  Negative for back pain.  Skin: Negative.  Negative for rash.  Neurological: Negative.  Negative for dizziness, focal weakness, weakness and headaches.  Psychiatric/Behavioral: Negative.  The patient is not nervous/anxious.     As per HPI. Otherwise, a complete review of systems is negative.  PAST MEDICAL HISTORY: Past Medical History:  Diagnosis Date   A-fib [facility])    Chronic sinusitis    Fibrocystic breast disease    History of pneumonia 1999   Hyperlipidemia     Irritable bowel syndrome    Lichen planus    lymphoma August 2013   Low grade B cell   Mild tricuspid insufficiency Jan 2012   ECHO, [place]   Moderate mitral insufficiency JAN 2012   ECHO, [place]    PAST SURGICAL HISTORY: Past Surgical History:  Procedure Laterality Date   ABDOMINAL HYSTERECTOMY     precancerous cervix,     ABLATION OF DYSRHYTHMIC FOCUS  August 2013   [facility], Dr. Marcello [name]   BREAST SURGERY     bilateral, benign biopsies   CARDIOVERSION N/A 11/23/2020   Procedure: CARDIOVERSION;  Surgeon: Carol Skains, MD;  Location: [facility] ORS;  Service: Cardiovascular;  Laterality: N/A;   CARDIOVERSION N/A 01/17/2021   Procedure: CARDIOVERSION;  Surgeon: Carol Skains, MD;  Location: [facility] OR[facility];  Service: Cardiovascular;  Laterality: N/A;   CARDIOVERSION N/A 01/31/2021   Procedure: CARDIOVERSION;  Surgeon: Carol Skains, MD;  Location: [place] [facility];  Service: Cardiovascular;  Laterality: N/A;   CARDIOVERSION N/A 10/09/2021   Procedure: CARDIOVERSION;  Surgeon: Carol Skains, MD;  Location: [place] [facility];  Service: Cardiovascular;  Laterality: N/A;   CARDIOVERSION N/A 01/17/2022   Procedure: CARDIOVERSION;  Surgeon: Carol Skains, MD;  Location: [facility] OR[facility];  Service: Cardiovascular;  Laterality: N/A;   MAXILLARY SINUS LIFT  1990's   Carol Carol [name]   oophorectomy     squamous cell removal Right    right leg calf area.   TEE WITHOUT CARDIOVERSION N/A 01/31/2021   Procedure: TRANSESOPHAGEAL ECHOCARDIOGRAM (TEE);  Surgeon: Carol Skains, MD;  Location: [facility] OR[facility];  Service: Cardiovascular;  Laterality: N/A;   TEE WITHOUT CARDIOVERSION N/A 10/09/2021  Procedure: TRANSESOPHAGEAL ECHOCARDIOGRAM (TEE);  Surgeon: Carol Skains, MD;  Location: [facility] OR[facility];  Service: Cardiovascular;  Laterality: N/A;    FAMILY HISTORY: Family History  Problem Relation Age of Onset   Cancer Mother        Breast   Hyperlipidemia Father    Heart disease Father    Hypertension Father    Kidney  disease Father    Cancer Maternal Grandfather        colon CA   Diverticulitis Sister     ADVANCED DIRECTIVES (Y/N):  '@ADVDIR'$ @  HEALTH MAINTENANCE: Social History   Tobacco Use   Smoking status: Never   Smokeless tobacco: Never  Substance Use Topics   Alcohol use: Yes    Alcohol/week: 4.0 standard drinks of alcohol    Types: 4 Glasses of wine per week    Comment: Occ.   Drug use: No     Colonoscopy:  PAP:  Bone density:  Lipid panel:  Allergies  Allergen Reactions   Prednisone Palpitations    A. fib   Pulmicort [Budesonide] Shortness Of Breath, Itching, Swelling and Other (See Comments)    Felt as if things were crawly on her   Clonidine Other (See Comments)    Pt felt like things were crawling on her arms.   Lotemax [Loteprednol Etabonate]     Broke out around the eye   Morphine And Related Itching and Other (See Comments)    "creepy crawly feeling"   Trovan [Alatrofloxacin Mesylate] Other (See Comments)    "effected the whole system"   Zelnorm [Tegaserod Maleate] Itching    Felt as if things were crawly on her    Erythromycin Rash    Current Facility-Administered Medications  Medication Dose Route Frequency Provider Last Rate Last Admin   acetaminophen (TYLENOL) tablet 650 mg  650 mg Oral Q6H PRN Ivor Costa, MD   650 mg at 04/24/22 1516   amiodarone (PACERONE) tablet 200 mg  200 mg Oral Daily Ivor Costa, MD   200 mg at 04/24/22 6378   B-complex with vitamin C tablet 1 tablet  1 tablet Oral Daily Ivor Costa, MD   1 tablet at 04/24/22 0853   bisacodyl (DULCOLAX) EC tablet 5 mg  5 mg Oral Daily PRN Ivor Costa, MD       ceFAZolin (ANCEF) IVPB 2g/100 mL premix  2 g Intravenous Q8H Oswald Hillock, RPH 200 mL/hr at 04/24/22 1507 2 g at 04/24/22 1507   cholecalciferol (VITAMIN D3) tablet 2,000 Units  2,000 Units Oral Daily Ivor Costa, MD   2,000 Units at 04/24/22 0851   diltiazem (CARDIZEM LA) 24 hr tablet 180 mg  180 mg Oral QHS Foust, Katy L, NP   180 mg at  04/23/22 2218   diphenhydrAMINE (BENADRYL) capsule 25 mg  25 mg Oral Q6H PRN Ivor Costa, MD       docusate sodium (COLACE) capsule 100 mg  100 mg Oral Daily PRN Ivor Costa, MD       furosemide (LASIX) tablet 40 mg  40 mg Oral Daily Ivor Costa, MD   40 mg at 04/24/22 0851   gabapentin (NEURONTIN) capsule 100 mg  100 mg Oral Daily Ivor Costa, MD   100 mg at 04/24/22 0851   gabapentin (NEURONTIN) capsule 200 mg  200 mg Oral QHS Ivor Costa, MD   200 mg at 04/23/22 2217   leptospermum manuka honey ([place]) paste 1 Application  1 Application Topical Daily Enzo Bi, MD   1 Application at  04/24/22 1234   melatonin tablet 5 mg  5 mg Oral QHS PRN Ivor Costa, MD       metoprolol succinate (TOPROL-XL) 24 hr tablet 100 mg  100 mg Oral Daily Ivor Costa, MD   100 mg at 04/24/22 0853   metoprolol succinate (TOPROL-XL) 24 hr tablet 50 mg  50 mg Oral QHS Ivor Costa, MD   50 mg at 04/23/22 2218   multivitamin with minerals tablet 0.5 tablet  0.5 tablet Oral BID Ivor Costa, MD   0.5 tablet at 04/24/22 0852   ondansetron (ZOFRAN) injection 4 mg  4 mg Intravenous Q8H PRN Ivor Costa, MD       pantoprazole (PROTONIX) EC tablet 40 mg  40 mg Oral Daily Ivor Costa, MD   40 mg at 04/24/22 0851   [START ON 04/25/2022] rosuvastatin (CRESTOR) tablet 5 mg  5 mg Oral Once per day on Mon Thu Niu, Xilin, MD       vitamin E capsule 1,000 Units  1,000 Units Oral BID Ivor Costa, MD   1,000 Units at 04/24/22 7681   Warfarin - Pharmacist Dosing Inpatient   Does not apply q1600 Pearla Dubonnet, [place]   Given at 04/24/22 1511    OBJECTIVE: Vitals:   04/24/22 0221 04/24/22 0815  BP: 123/64 (!) 146/76  Pulse: (!) 56 61  Resp: 18 18  Temp: 97.7 F (36.5 C) 97.8 F (36.6 C)  SpO2: 97% 93%     Body mass index is 28.25 kg/m.    ECOG FS:0 - Asymptomatic  General: Well-developed, well-nourished, no acute distress. Eyes: Pink conjunctiva, anicteric sclera. HEENT: Normocephalic, moist mucous membranes. Lungs: No audible wheezing or  coughing. Heart: Regular rate and rhythm. Abdomen: Soft, nontender, no obvious distention. Musculoskeletal: No edema, cyanosis, or clubbing. Neuro: Alert, answering all questions appropriately. Cranial nerves grossly intact. Skin: Erythematous chest wall with 2 to 3 cm ulcerative lesion. Psych: Normal affect. Lymphatics: No cervical, calvicular, axillary or inguinal LAD.   LAB RESULTS:  Lab Results  Component Value Date   NA 133 (L) 04/23/2022   K 4.0 04/23/2022   CL 103 04/23/2022   CO2 21 (L) 04/23/2022   GLUCOSE 128 (H) 04/23/2022   BUN 15 04/23/2022   CREATININE 0.84 04/23/2022   CALCIUM 9.0 04/23/2022   PROT 6.1 10/15/2021   ALBUMIN 3.9 10/15/2021   AST 35 10/15/2021   ALT 54 (H) 10/15/2021   ALKPHOS 80 10/15/2021   BILITOT 0.9 10/15/2021   GFRNONAA >60 04/23/2022   GFRAA >60 09/29/2017    Lab Results  Component Value Date   WBC 6.0 04/24/2022   NEUTROABS 5.4 03/08/2022   HGB 11.2 (L) 04/24/2022   HCT 33.2 (L) 04/24/2022   MCV 93.3 04/24/2022   PLT 256 04/24/2022     STUDIES: US BREAST LTD UNI RIGHT INC AXILLA  Result Date: 04/23/2022 CLINICAL DATA:  81 year old presenting with burning pain in the RIGHT breast. 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 She presents now with worsening pain. She was given a prescription for Augmentin 3 days ago. Personal history of malignant RIGHT breast lumpectomy in July, 2015 at [facility]. EXAM: ULTRASOUND OF THE RIGHT BREAST COMPARISON:  RIGHT breast ultrasound 05/18/2014 at the time of breast cancer diagnosis. No interval breast imaging at [facility]. She states that she receives her breast imaging at [facility]. FINDINGS: Targeted ultrasound is performed,  showing a heterogeneous mixed solid and fluid collection in the superficial tissues at the 11:30 o'clock position 6 cm from the nipple  measuring approximately 2.6 x 1.6 x 2.2 cm, demonstrating posterior acoustic enhancement and mild hyperemia on power Doppler flow. There is skin thickening/edema and there is edema in the surrounding breast tissues. IMPRESSION: Approximate 2.6 cm phlegmon in the superficial tissues of the RIGHT breast at the 11:30 o'clock position 6 cm from the nipple. This has not yet liquified into an abscess and is not a drainable collection. RECOMMENDATION: 1. Antibiotic therapy. 2. If symptoms persist, follow-up RIGHT breast ultrasound in 2 weeks after completion of antibiotic therapy. 3. Annual mammography for which the patient currently goes to [facility]. BI-RADS CATEGORY  2: Benign. Electronically Signed   By: Evangeline Dakin M.D.   On: 04/23/2022 13:28    ASSESSMENT: Chest wall infection, history of melanoma and breast cancer.  PLAN:    History of breast cancer: Patient reports her breast cancer was greater than 20 years ago.  She was treated at [facility] with surgery, adjuvant chemotherapy, and adjuvant XRT.  She also was on tamoxifen for 5 years.  This is unlikely recurrence, but will await recent biopsies. Melanoma: Patient recently had a small melanoma removed from her chest wall, unclear stage or depth.  Patient also had biopsy on Friday at the dermatologist and more recently in the hospital, results of both are pending at time of dictation.  Chest wall infection: Appreciate infectious disease and surgery input.  Patient scheduled for I&D tomorrow.  Appreciate consult, will follow.   Lloyd Huger, MD   04/24/2022 3:36 PM

## 2022-04-25 ENCOUNTER — Inpatient Hospital Stay: Payer: Medicare Other | Admitting: Certified Registered"

## 2022-04-25 ENCOUNTER — Other Ambulatory Visit: Payer: Self-pay

## 2022-04-25 ENCOUNTER — Encounter
Admission: EM | Disposition: A | Discharge status: Home Health | Payer: Self-pay | Source: Home / Self Care | Attending: Hospitalist

## 2022-04-25 ENCOUNTER — Encounter: Payer: Self-pay | Admitting: Internal Medicine

## 2022-04-25 DIAGNOSIS — N611 Abscess of the breast and nipple: Secondary | ICD-10-CM | POA: Diagnosis not present

## 2022-04-25 DIAGNOSIS — N61 Mastitis without abscess: Secondary | ICD-10-CM | POA: Diagnosis not present

## 2022-04-25 HISTORY — PX: INCISION AND DRAINAGE ABSCESS: SHX5864

## 2022-04-25 LAB — CBC
HCT: 37.3 % (ref 36.0–46.0)
Hemoglobin: 12.6 g/dL (ref 12.0–15.0)
MCH: 31.4 pg (ref 26.0–34.0)
MCHC: 33.8 g/dL (ref 30.0–36.0)
MCV: 93 fL (ref 80.0–100.0)
Platelets: 286 10*3/uL (ref 150–400)
RBC: 4.01 MIL/uL (ref 3.87–5.11)
RDW: 14.1 % (ref 11.5–15.5)
WBC: 6.6 10*3/uL (ref 4.0–10.5)
nRBC: 0 % (ref 0.0–0.2)

## 2022-04-25 LAB — BASIC METABOLIC PANEL
Anion gap: 7 (ref 5–15)
BUN: 11 mg/dL (ref 8–23)
CO2: 27 mmol/L (ref 22–32)
Calcium: 9.2 mg/dL (ref 8.9–10.3)
Chloride: 102 mmol/L (ref 98–111)
Creatinine, Ser: 0.69 mg/dL (ref 0.44–1.00)
GFR, Estimated: >60 mL/min (ref >60)
Glucose, Bld: 112 mg/dL — ABNORMAL HIGH (ref 70–99)
Potassium: 3.3 mmol/L — ABNORMAL LOW (ref 3.5–5.1)
Sodium: 136 mmol/L (ref 135–145)

## 2022-04-25 LAB — PROTIME-INR
INR: 1.2 (ref 0.8–1.2)
Prothrombin Time: 15.4 seconds — ABNORMAL HIGH (ref 11.4–15.2)

## 2022-04-25 LAB — MAGNESIUM: Magnesium: 2.3 mg/dL (ref 1.7–2.4)

## 2022-04-25 SURGERY — INCISION AND DRAINAGE, ABSCESS
Anesthesia: General | Laterality: Right

## 2022-04-25 MED ORDER — FENTANYL CITRATE (PF) 100 MCG/2ML IJ SOLN: 25.0000 ug | INTRAMUSCULAR | Status: DC | PRN

## 2022-04-25 MED ORDER — KETOROLAC TROMETHAMINE 30 MG/ML IJ SOLN
INTRAMUSCULAR | Status: DC | PRN
  Administered 2022-04-25: 30 mg via INTRAVENOUS

## 2022-04-25 MED ORDER — PHENYLEPHRINE 80 MCG/ML (10ML) SYRINGE FOR IV PUSH (FOR BLOOD PRESSURE SUPPORT)
PREFILLED_SYRINGE | INTRAVENOUS | Status: DC | PRN
  Administered 2022-04-25 (×2): 80 ug via INTRAVENOUS
  Administered 2022-04-25 (×2): 120 ug via INTRAVENOUS

## 2022-04-25 MED ORDER — FENTANYL CITRATE (PF) 100 MCG/2ML IJ SOLN
INTRAMUSCULAR | Status: AC
  Filled 2022-04-25: qty 2

## 2022-04-25 MED ORDER — ONDANSETRON HCL 4 MG/2ML IJ SOLN: 4.0000 mg | Freq: Once | INTRAMUSCULAR | Status: DC | PRN

## 2022-04-25 MED ORDER — GLYCOPYRROLATE 0.2 MG/ML IJ SOLN
INTRAMUSCULAR | Status: DC | PRN
  Administered 2022-04-25: .2 mg via INTRAVENOUS

## 2022-04-25 MED ORDER — PROPOFOL 10 MG/ML IV BOLUS
INTRAVENOUS | Status: AC
  Filled 2022-04-25: qty 20

## 2022-04-25 MED ORDER — PROPOFOL 10 MG/ML IV BOLUS
INTRAVENOUS | Status: DC | PRN
  Administered 2022-04-25: 150 mg via INTRAVENOUS

## 2022-04-25 MED ORDER — EPHEDRINE SULFATE (PRESSORS) 50 MG/ML IJ SOLN
INTRAMUSCULAR | Status: DC | PRN
  Administered 2022-04-25 (×4): 5 mg via INTRAVENOUS

## 2022-04-25 MED ORDER — ACETAMINOPHEN 10 MG/ML IV SOLN
INTRAVENOUS | Status: AC
  Filled 2022-04-25: qty 100

## 2022-04-25 MED ORDER — BUPIVACAINE-EPINEPHRINE 0.25% -1:200000 IJ SOLN
INTRAMUSCULAR | Status: DC | PRN
  Administered 2022-04-25: 10 mL

## 2022-04-25 MED ORDER — OXYCODONE HCL 5 MG/5ML PO SOLN: 5.0000 mg | Freq: Once | ORAL | Status: DC | PRN

## 2022-04-25 MED ORDER — OXYCODONE HCL 5 MG PO TABS: 5.0000 mg | ORAL_TABLET | Freq: Once | ORAL | Status: DC | PRN

## 2022-04-25 MED ORDER — ONDANSETRON HCL 4 MG/2ML IJ SOLN
INTRAMUSCULAR | Status: DC | PRN
  Administered 2022-04-25: 4 mg via INTRAVENOUS

## 2022-04-25 MED ORDER — LACTATED RINGERS IV SOLN: INTRAVENOUS | Status: DC | PRN

## 2022-04-25 MED ORDER — WARFARIN SODIUM 7.5 MG PO TABS
7.5000 mg | ORAL_TABLET | Freq: Once | ORAL | Status: AC
  Administered 2022-04-25: 7.5 mg via ORAL
  Filled 2022-04-25: qty 1

## 2022-04-25 MED ORDER — LIDOCAINE HCL (CARDIAC) PF 100 MG/5ML IV SOSY
PREFILLED_SYRINGE | INTRAVENOUS | Status: DC | PRN
  Administered 2022-04-25: 100 mg via INTRAVENOUS

## 2022-04-25 MED ORDER — LACTATED RINGERS IV SOLN: INTRAVENOUS | Status: DC

## 2022-04-25 MED ORDER — FENTANYL CITRATE (PF) 100 MCG/2ML IJ SOLN
INTRAMUSCULAR | Status: DC | PRN
  Administered 2022-04-25: 50 ug via INTRAVENOUS

## 2022-04-25 MED ORDER — BUPIVACAINE-EPINEPHRINE (PF) 0.25% -1:200000 IJ SOLN
INTRAMUSCULAR | Status: AC
  Filled 2022-04-25: qty 30

## 2022-04-25 MED ORDER — ACETAMINOPHEN 10 MG/ML IV SOLN
1000.0000 mg | Freq: Once | INTRAVENOUS | Status: DC | PRN
Start: 2022-04-25 — End: 2022-04-25

## 2022-04-25 MED ORDER — ACETAMINOPHEN 10 MG/ML IV SOLN
INTRAVENOUS | Status: DC | PRN
  Administered 2022-04-25: 1000 mg via INTRAVENOUS

## 2022-04-25 MED ORDER — HYDROCODONE-ACETAMINOPHEN 5-325 MG PO TABS: 1.0000 | ORAL_TABLET | ORAL | Status: DC | PRN

## 2022-04-25 SURGICAL SUPPLY — 26 items
BLADE SURG 15 STRL LF DISP TIS (BLADE) ×2 IMPLANT
BLADE SURG 15 STRL SS (BLADE) ×2
DRAPE LAPAROTOMY 77X122 PED (DRAPES) ×3 IMPLANT
DRSG PAD ABDOMINAL 8X10 ST (GAUZE/BANDAGES/DRESSINGS) ×1 IMPLANT
ELECT CAUTERY BLADE 6.4 (BLADE) ×3 IMPLANT
ELECT REM PT RETURN 9FT ADLT (ELECTROSURGICAL) ×2
ELECTRODE REM PT RTRN 9FT ADLT (ELECTROSURGICAL) ×2 IMPLANT
GAUZE 4X4 16PLY ~~LOC~~+RFID DBL (SPONGE) ×3 IMPLANT
GAUZE SPONGE 4X4 12PLY STRL (GAUZE/BANDAGES/DRESSINGS) ×2 IMPLANT
GLOVE ORTHO TXT STRL SZ7.5 (GLOVE) ×3 IMPLANT
GOWN STRL REUS W/ TWL LRG LVL3 (GOWN DISPOSABLE) ×2 IMPLANT
GOWN STRL REUS W/ TWL XL LVL3 (GOWN DISPOSABLE) ×2 IMPLANT
GOWN STRL REUS W/TWL LRG LVL3 (GOWN DISPOSABLE) ×2
GOWN STRL REUS W/TWL XL LVL3 (GOWN DISPOSABLE) ×2
KIT TURNOVER KIT A (KITS) ×3 IMPLANT
MANIFOLD NEPTUNE II (INSTRUMENTS) ×3 IMPLANT
NEEDLE HYPO 22GX1.5 SAFETY (NEEDLE) ×3 IMPLANT
NS IRRIG 1000ML POUR BTL (IV SOLUTION) ×3 IMPLANT
PACK BASIN MINOR ARMC (MISCELLANEOUS) ×3 IMPLANT
SOL PREP PVP 2OZ (MISCELLANEOUS) ×2
SOLUTION PREP PVP 2OZ (MISCELLANEOUS) ×2 IMPLANT
SPONGE T-LAP 18X18 ~~LOC~~+RFID (SPONGE) ×3 IMPLANT
SWAB CULTURE AMIES ANAERIB BLU (MISCELLANEOUS) ×3 IMPLANT
SYR 10ML LL (SYRINGE) ×3 IMPLANT
SYR BULB IRRIG 60ML STRL (SYRINGE) ×3 IMPLANT
WATER STERILE IRR 500ML POUR (IV SOLUTION) ×3 IMPLANT

## 2022-04-25 NOTE — Progress Notes (Signed)
Date of Admission:  04/23/2022     ID: Carol [name] is a 81 y.o. female  Principal Problem:   Cellulitis of right breast Active Problems:   Hyperlipidemia   Atrial fibrillation s/p ablation 2022, s/p cardioversion 02/2022 ([place])   Chronic systolic CHF (congestive heart failure) (HCC)    Subjective: Underwent I/D and excisional debridement of the nodular abscess  Medications:   amiodarone  200 mg Oral Daily   B-complex with vitamin C  1 tablet Oral Daily   cholecalciferol  2,000 Units Oral Daily   diltiazem  180 mg Oral QHS   furosemide  40 mg Oral Daily   gabapentin  100 mg Oral Daily   gabapentin  200 mg Oral QHS   leptospermum manuka honey  1 Application Topical Daily   metoprolol succinate  100 mg Oral Daily   metoprolol succinate  50 mg Oral QHS   multivitamin with minerals  0.5 tablet Oral BID   pantoprazole  40 mg Oral Daily   rosuvastatin  5 mg Oral Once per day on Mon Thu   warfarin  7.5 mg Oral ONCE-1600   Warfarin - Pharmacist Dosing Inpatient   Does not apply q1600    Objective: Vital signs in last 24 hours: Temp:  [97 F (36.1 C)-98.3 F (36.8 C)] 97 F (36.1 C) (06/22 1400) Pulse Rate:  [54-82] 68 (06/22 1400) Resp:  [10-18] 17 (06/22 1400) BP: (120-158)/(66-95) 139/74 (06/22 1400) SpO2:  [94 %-100 %] 100 % (06/22 1400) Weight:  [79.4 kg] 79.4 kg (06/22 1111)   PHYSICAL EXAM:  General: Alert, cooperative, no distress, appears stated age.  Head: Normocephalic, without obvious abnormality, atraumatic. Eyes: Conjunctivae clear, anicteric sclerae. Pupils are equal ENT Nares normal. No drainage or sinus tenderness. Lips, mucosa, and tongue normal. No Thrush Neck: Supple, symmetrical, no adenopathy, thyroid: non tender no carotid bruit and no JVD. Back: No CVA tenderness. Lungs: Clear to auscultation bilaterally. No Wheezing or Rhonchi. No rales. Heart: Regular rate and rhythm, no murmur, rub or gallop. Right chest area covered with  surgical dressing dressing Abdomen: Soft, non-tender,not distended. Bowel sounds normal. No masses Extremities: atraumatic, no cyanosis. No edema. No clubbing Skin: No rashes or lesions. Or bruising Lymph: Cervical, supraclavicular normal. Neurologic: Grossly non-focal  Lab Results Recent Labs    04/23/22 1044 04/24/22 0535 04/25/22 0602  WBC 7.6 6.0 6.6  HGB 12.2 11.2* 12.6  HCT 36.9 33.2* 37.3  NA 133*  --  136  K 4.0  --  3.3*  CL 103  --  102  CO2 21*  --  27  BUN 15  --  11  CREATININE 0.84  --  0.69   Liver Panel No results for input(s): "PROT", "ALBUMIN", "AST", "ALT", "ALKPHOS", "BILITOT", "BILIDIR", "IBILI" in the last 72 hours. Sedimentation Rate Recent Labs    04/23/22 1632  ESRSEDRATE 57*   C-Reactive Protein Recent Labs    04/23/22 1632  CRP 1.8*    Microbiology: MSSA and wound culture Studies/Results: No results found.   Assessment/Plan:  MSSA abscess/phlegmon- I/D and debridement done today Path and culture sent' continue cefazolin until the surgical wound is assessed and cleared by surgeon in the coming days Then can be swithced to an appropriate PO after minimum of 5 days of IV. Pt had been on prolonged course of augmentin and devloped this abscess while on it- so diclox '500mg'$  po q6  x2 weeks or Cephalexin would be the PO option  H/o melanoma resection from  the rt breast in April 2023 H/o ca breast rt- lumpectomy and chemo  Discussed the management with the patient and the care team.  [facility] is covering by phone on Friday and the weekend.  Call if needed

## 2022-04-25 NOTE — Op Note (Signed)
Incision and drainage/excisional debridement right upper breast abscess.  Pre-operative Diagnosis: Right upper breast abscess  Post-operative Diagnosis: same.    Surgeon: Ronny Bacon, M.D., FACS  Anesthesia: General LMA  Findings: Poorly isolated abscess cavity, with minimal evidence of granulation surrounding region.  Culture for sensitivity, tissue for permanent pathology.  Estimated Blood Loss: 15 mL         Specimens: Drainage for culture and sensitivity, tissue current path.          Complications: none              Procedure Details  The patient was seen again in the Holding Room. The benefits, complications, treatment options, and expected outcomes were discussed with the patient. The risks of bleeding, infection, recurrence of symptoms, failure to resolve symptoms, unanticipated injury, prosthetic placement, prosthetic infection, any of which could require further surgery were reviewed with the patient. The likelihood of improving the patient's symptoms with return to their baseline status is expected.  The patient and/or family concurred with the proposed plan, giving informed consent.  The patient was taken to Operating Room, identified and the procedure verified.    Prior to the induction of general anesthesia, antibiotic prophylaxis was administered. VTE prophylaxis was in place.  General was then administered and tolerated well. After the induction, the patient was positioned in the supine position and the right breast was prepped with Betadine and draped in the sterile fashion.  A Time Out was held and the above information confirmed.  Incision was made through the necrotic eschar through which purulent drainage was noted.  Purulent drainage obtained immediately, specimens obtained for culture and sensitivity.  Full excision of the overlying eschar with adjacent compromised skin edges for complete and adequate drainage of the multiloculated abscess cavity.  Blunt  exploration performed circumferentially in the soft tissues to ensure no additional abscesses or tracking remain.  Wound was irrigated with saline, adequate hemostasis confirmed.  Local infiltration with quarter percent Marcaine with epinephrine.  Wound was packed with inch wide iodoform, diluted in saline, review wet with local anesthetic.  Overlay of Xeroform and 4 x 4's complete the dressing. Excisional debridement to the size of wound measures 5 cm from left to right, 2.5 cm from craniocaudad and 2.5 cm in depth.  Shape is irregular      Ronny Bacon M.D., [facility] 04/25/2022 1:05 PM

## 2022-04-25 NOTE — Progress Notes (Signed)
Wound care completed prior to patient going to bed tonight. Cleansed breast area with normal saline, then pat dry. Applied think gauze with medi honey treatment and covered with mepilex. No additional tape applied to area. Smaller mepilex applied to center of chest with smaller open biopsy site. No c/o pain while changing dressing to right breast. Minimal drainage noted.

## 2022-04-25 NOTE — Progress Notes (Signed)
  Progress Note   Patient: Carol [name] YBF:[id number] DOB: 1941/01/12 DOA: 04/23/2022     2 DOS: the patient was seen and examined on 04/25/2022   Brief hospital course: No notes on file  Assessment and Plan: * Cellulitis of right breast With phlegmon and abscesses formation --initially presented in May 2023 with cellulitis after melanoma resection.  No drainable collection at that time.  Superficial cx grew MRSA.  Pt received IV vanc/cefe followed by doxy.   --returned with persistent infection, with US showing phlegmon formation. --ID and oncology consulted on admission Plan: --cont Ancef --OR I/D today  Chronic systolic CHF (congestive heart failure) ([place]) 2D echo on 10/09/2021 showed EF of 35- 40%.  Patient has trace leg edema, BNP is slightly elevated 337, but no shortness of breath.  Patient does not seem to have CHF exacerbation. -Continue home Lasix 40 mg daily  Atrial fibrillation s/p ablation 2022, s/p cardioversion 02/2022 ([place]) Heart rate controlled -Continue home metoprolol, amiodarone, diltiazem -Coumadin per pharma  Hyperlipidemia - Crestor        Subjective:  Pt went for OR I/D today, tolerated it well.   Physical Exam:  Constitutional: NAD, AAOx3 HEENT: conjunctivae and lids normal, EOMI CV: No cyanosis.   RESP: normal respiratory effort, on RA Neuro: II - XII grossly intact.   Psych: Normal mood and affect.  Appropriate judgement and reason   Data Reviewed:  Family Communication:   Disposition: Status is: Inpatient   Planned Discharge Destination: Home    Time spent: 25 minutes  Author: Enzo Bi, MD 04/25/2022 4:31 PM  For on call review [email or web address].

## 2022-04-25 NOTE — Transfer of Care (Signed)
Immediate Anesthesia Transfer of Care Note  Patient: Carol [name]  Procedure(s) Performed: INCISION AND DRAINAGE ABSCESS (Right)  Patient Location: PACU  Anesthesia Type:General  Level of Consciousness: awake, drowsy and patient cooperative  Airway & Oxygen Therapy: Patient Spontanous Breathing and Patient connected to face mask oxygen  Post-op Assessment: Report given to RN and Post -op Vital signs reviewed and stable  Post vital signs: Reviewed and stable  Last Vitals:  Vitals Value Taken Time  BP 122/72 04/25/22 1315  Temp 36.2 C 04/25/22 1311  Pulse 79 04/25/22 1318  Resp 10 04/25/22 1318  SpO2 100 % 04/25/22 1318  Vitals shown include unvalidated device data.  Last Pain:  Vitals:   04/25/22 1311  TempSrc:   PainSc: 0-No pain      Patients Stated Pain Goal: 0 (14/27/67 0110)  Complications: No notable events documented.

## 2022-04-25 NOTE — Progress Notes (Signed)
[facility] Hospital Day(s): 2.   Interval History:  Patient seen and examined No acute events or new complaints overnight.  Patient reports She is doing fine Remains without leukocytosis Plan for I&D of right breast abscess/wound   Vital signs in last 24 hours: [min-max] current  Temp:  [97.7 F (36.5 C)-98.3 F (36.8 C)] 97.7 F (36.5 C) (06/21 1951) Pulse Rate:  [54-61] 54 (06/22 0547) Resp:  [16-18] 16 (06/21 1829) BP: (123-158)/(66-95) 144/66 (06/22 0547) SpO2:  [93 %-97 %] 97 % (06/22 0547)     Height: '5\' 6"'$  (167.6 cm) Weight: 79.4 kg BMI (Calculated): 28.27   Intake/Output last 2 shifts:  06/21 0701 - 06/22 0700 In: 1020 [P.O.:720; IV Piggyback:300] Out: -    Physical Exam:  Constitutional: alert, cooperative and no distress  Respiratory: breathing non-labored at rest  Cardiovascular: regular rate and sinus rhythm  Integumentary: right breast wound/abscess  Labs:     Latest Ref Rng & Units 04/25/2022    6:02 AM 04/24/2022    5:35 AM 04/23/2022   10:44 AM  CBC  WBC 4.0 - 10.5 K/uL 6.6  6.0  7.6   Hemoglobin 12.0 - 15.0 g/dL 12.6  11.2  12.2   Hematocrit 36.0 - 46.0 % 37.3  33.2  36.9   Platelets 150 - 400 K/uL 286  256  289       Latest Ref Rng & Units 04/25/2022    6:02 AM 04/23/2022   10:44 AM 03/11/2022    5:36 AM  CMP  Glucose 70 - 99 mg/dL 112  128  95   BUN 8 - 23 mg/dL '11  15  18   '$ Creatinine 0.44 - 1.00 mg/dL 0.69  0.84  0.74   Sodium 135 - 145 mmol/L 136  133  137   Potassium 3.5 - 5.1 mmol/L 3.3  4.0  3.2   Chloride 98 - 111 mmol/L 102  103  104   CO2 22 - 32 mmol/L '27  21  25   '$ Calcium 8.9 - 10.3 mg/dL 9.2  9.0  8.3     Imaging studies: No new pertinent imaging studies   Assessment/Plan: (ICD-10's: N61.1) 81 y.o. female with history of recent right breast celullitis which now appears to have consolidated into an abscess              - I&D of right breast wound/abscess this afternoon with Dr  Christian Mate            - All risks, benefits, and alternatives to above procedure(s) were discussed with the patient, all of her questions were answered to her expressed satisfaction, patient expresses she wishes to proceed, and informed consent was obtained.              - NPO; okay to resume diet post-op              - Continue IV Abx             - Pain control prn            - Further management per primary service; we will follow    All of the above findings and recommendations were discussed with the patient, and all of patient's questions were answered to her expressed satisfaction.  -- Edison Simon, PA-C  [facility] 04/25/2022, 7:40 AM M-F: 7am - 4pm

## 2022-04-25 NOTE — Interval H&P Note (Signed)
History and Physical Interval Note:  04/25/2022 10:10 AM  Carol [name]  has presented today for surgery, with the diagnosis of Right Breast Abscess.  The various methods of treatment have been discussed with the patient and family. After consideration of risks, benefits and other options for treatment, the patient has consented to  Procedure(s): INCISION AND DRAINAGE ABSCESS (Right) as a surgical intervention.  The patient's history has been reviewed, patient examined, no change in status, stable for surgery.  I have reviewed the patient's chart and labs.  Questions were answered to the patient's satisfaction.     Ronny Bacon

## 2022-04-25 NOTE — Consult Note (Signed)
[place] for Warfarin Indication: atrial fibrillation  Allergies  Allergen Reactions   Prednisone Palpitations    A. fib   Pulmicort [Budesonide] Shortness Of Breath, Itching, Swelling and Other (See Comments)    Felt as if things were crawly on her   Clonidine Other (See Comments)    Pt felt like things were crawling on her arms.   Lotemax [Loteprednol Etabonate]     Broke out around the eye   Morphine And Related Itching and Other (See Comments)    "creepy crawly feeling"   Trovan [Alatrofloxacin Mesylate] Other (See Comments)    "effected the whole system"   Zelnorm [Tegaserod Maleate] Itching    Felt as if things were crawly on her    Erythromycin Rash    Patient Measurements: Height: '5\' 6"'$  (167.6 cm) Weight: 79.4 kg (175 lb 0.7 oz) IBW/kg (Calculated) : 59.3 Heparin Dosing Weight: N/A  Vital Signs: Temp: 97.7 F (36.5 C) (06/21 1951) Temp Source: Oral (06/21 1951) BP: 144/66 (06/22 0547) Pulse Rate: 54 (06/22 0547)  Labs: Recent Labs    04/23/22 1044 04/23/22 1632 04/24/22 0535 04/25/22 0602  HGB 12.2  --  11.2* 12.6  HCT 36.9  --  33.2* 37.3  PLT 289  --  256 286  APTT 27  --   --   --   LABPROT  --  14.3 15.2 15.4*  INR  --  1.1 1.2 1.2  CREATININE 0.84  --   --  0.69     Estimated Creatinine Clearance: 58.6 mL/min (by C-G formula based on SCr of 0.69 mg/dL).   Medical History: Past Medical History:  Diagnosis Date   A-fib [facility])    Chronic sinusitis    Fibrocystic breast disease    History of pneumonia 1999   Hyperlipidemia    Irritable bowel syndrome    Lichen planus    lymphoma August 2013   Low grade B cell   Mild tricuspid insufficiency Jan 2012   ECHO, [place]   Moderate mitral insufficiency JAN 2012   ECHO, [place]    Medications:  PTA dose: Warfarin '4mg'$  MWF, '5mg'$  TuThSaSu  Assessment: Pharmacy has been consulted to monitor and dose warfarin in 80yo patient with history of atrial  fibrillation sCHF, HLD, mitral valve and tricuspid valve insufficiency, low-grade B-cell lymphoma, IBS, breast cancer (s/p of right lumpectomy, chemotherapy), s/p of melanoma resection, who presents with right breast wound. Patient's last dose take was '4mg'$  on 6/19 in the evening.   Date INR Dose 6/19 PTA '4mg'$   6/20 1.1 7.'5mg'$  6/21 1.2 '5mg'$  6/21 1.2 7.'5mg'$   Goal of Therapy:  INR 2-3 Monitor platelets by anticoagulation protocol: Yes   Plan:  INR 1.2>1.2; despite loading dose on 6/20 of 1.5x home dose & '5mg'$  last night.  Will do additional loading dose of 7.'5mg'$  x1 tonight.  Pt has new DDI antibiotics, resumed 100% meals w/ diet 6/21 & NPO 6/22 MN. Will continue to adjust dose per INRs - CBC/INR daily  Lorna Dibble 04/25/2022,7:47 AM

## 2022-04-25 NOTE — Anesthesia Preprocedure Evaluation (Addendum)
Anesthesia Evaluation  Patient identified by MRN, date of birth, ID band Patient awake    Reviewed: Allergy & Precautions, NPO status , Patient's Chart, lab work & pertinent test results  History of Anesthesia Complications Negative for: history of anesthetic complications  Airway Mallampati: I   Neck ROM: Full    Dental no notable dental hx.    Pulmonary neg pulmonary ROS,    Pulmonary exam normal breath sounds clear to auscultation       Cardiovascular +CHF (dilated cardiomyopathy, EF 35-40%)  Normal cardiovascular exam+ dysrhythmias (a fib on warfarin) III Rhythm:Regular Rate:Normal  ECG 04/23/22:  Normal sinus rhythm Possible Anterior infarct (cited on or before 04-Mar-2022) Abnormal ECG When compared with ECG of 04-Mar-2022 21:21, No significant change was found  Echo 35/45/62:  MILD LV SYSTOLIC DYSFUNCTION WITH MILD LVH  NORMAL RIGHT VENTRICULAR SYSTOLIC FUNCTION  MODERATE VALVULAR REGURGITATION  NO VALVULAR STENOSIS  IRREGULAR HEART RHYTHM CAPTURED THROUGHOUT EXAM  LVEF DIFFICULT TO ESTIMATE DUE TO A-FIB  LVEF ESTIMATED 40%  Aortic: TRACE AI  AOV: MILDLY THICKENED, FULLY MOBILE LEAFLETS; SCLEROSIS WITH NO EVIDENCE OF STENOSIS  Mitral: MILD - MODERATE MR  Tricuspid: MILD TR (2.21ms)  Pulmonic: MILD PI  MODERATE LAE  MILDLY DILATED ASCENDING AORTA MEASURING 3.3cm  MILDLY DILATED AORTIC ROOT MEASURING 3.6cm    Neuro/Psych negative neurological ROS     GI/Hepatic negative GI ROS,   Endo/Other  negative endocrine ROS  Renal/GU negative Renal ROS     Musculoskeletal   Abdominal   Peds  Hematology Lymphoma    Anesthesia Other Findings Cardiology note 04/03/22:  Plan   -No change in current and appropriate medication management of atrial fibrillation and coexisting risk factors. The patient will continue to watch closely for any significant new symptoms or signs of side effects of medications and\or for  heart rate control of atrial fibrillation. -The patient is to decrease amiodarone dose to '100mg'$  for reduce risk of long-term side effects of the medication and continued maintenance of normal sinus rhythm at this time. -The patient will continue warfarin for risk reduction of stroke with atrial fibrillation and/or atrial flutter. The patient continues to understand all risks and benefits from the use of this medication including bleeding, medication interactions, and bruising. The patient has had discussion of alternatives to warfarin use for anticoagulation and the comparisons of risk reduction as well as side effects and complication. The current goal INR is 2-3. -There has been a discussion of the current guidelines for hypertension control. We will continue current medical regimen for hypertension control which will also help in risk factor modification of cardiovascular disease. The patient understands and agrees with the current plan. We will be watching for possible future side effects of these medications. Additional home blood pressure monitoring is recommended if able. -We have had a long discussion about the concerns of sodium and/or salt in the diet. The patient understands that higher sodium will increase blood pressure and potentially will cause edema. Therefore the patient was instructed to lower sodium content using the DASH diet for further risk reduction hypertension and edema.   Reproductive/Obstetrics                          Anesthesia Physical Anesthesia Plan  ASA: 3  Anesthesia Plan: General   Post-op Pain Management:    Induction: Intravenous  PONV Risk Score and Plan: 3 and Ondansetron, Dexamethasone and Treatment may vary due to age or medical condition  Airway Management Planned: LMA  Additional Equipment:   Intra-op Plan:   Post-operative Plan: Extubation in OR  Informed Consent: I have reviewed the patients History and Physical, chart,  labs and discussed the procedure including the risks, benefits and alternatives for the proposed anesthesia with the patient or authorized representative who has indicated his/her understanding and acceptance.     Dental advisory given  Plan Discussed with: CRNA  Anesthesia Plan Comments: (Patient consented for risks of anesthesia including but not limited to:  - adverse reactions to medications - damage to eyes, teeth, lips or other oral mucosa - nerve damage due to positioning  - sore throat or hoarseness - damage to heart, brain, nerves, lungs, other parts of body or loss of life  Informed patient about role of CRNA in peri- and intra-operative care.  Patient voiced understanding.)       Anesthesia Quick Evaluation

## 2022-04-25 NOTE — Anesthesia Procedure Notes (Signed)
Procedure Name: LMA Insertion Date/Time: 04/25/2022 12:24 PM  Performed by: Kelton Pillar, CRNAPre-anesthesia Checklist: Patient identified, Emergency Drugs available, Suction available and Patient being monitored Patient Re-evaluated:Patient Re-evaluated prior to induction Oxygen Delivery Method: Circle system utilized Preoxygenation: Pre-oxygenation with 100% oxygen Induction Type: IV induction Ventilation: Mask ventilation without difficulty LMA: LMA inserted LMA Size: 3.0 Placement Confirmation: positive ETCO2, CO2 detector and breath sounds checked- equal and bilateral Tube secured with: Tape Dental Injury: Teeth and Oropharynx as per pre-operative assessment

## 2022-04-25 NOTE — Anesthesia Postprocedure Evaluation (Signed)
Anesthesia Post Note  Patient: Carol [name]  Procedure(s) Performed: INCISION AND DRAINAGE ABSCESS (Right)  Patient location during evaluation: PACU Anesthesia Type: General Level of consciousness: awake and alert, oriented and patient cooperative Pain management: pain level controlled Vital Signs Assessment: post-procedure vital signs reviewed and stable Respiratory status: spontaneous breathing, nonlabored ventilation and respiratory function stable Cardiovascular status: blood pressure returned to baseline and stable Postop Assessment: adequate PO intake Anesthetic complications: no   No notable events documented.   Last Vitals:  Vitals:   04/25/22 1315 04/25/22 1330  BP: 122/72 122/72  Pulse: 82 74  Resp: 12 10  Temp:    SpO2: 100% 100%    Last Pain:  Vitals:   04/25/22 1311  TempSrc:   PainSc: 0-No pain                 Darrin Nipper

## 2022-04-26 ENCOUNTER — Encounter: Payer: Self-pay | Admitting: Surgery

## 2022-04-26 DIAGNOSIS — N61 Mastitis without abscess: Secondary | ICD-10-CM | POA: Diagnosis not present

## 2022-04-26 LAB — CBC
HCT: 35.1 % — ABNORMAL LOW (ref 36.0–46.0)
Hemoglobin: 12.2 g/dL (ref 12.0–15.0)
MCH: 31.4 pg (ref 26.0–34.0)
MCHC: 34.8 g/dL (ref 30.0–36.0)
MCV: 90.5 fL (ref 80.0–100.0)
Platelets: 309 10*3/uL (ref 150–400)
RBC: 3.88 MIL/uL (ref 3.87–5.11)
RDW: 14.1 % (ref 11.5–15.5)
WBC: 8.3 10*3/uL (ref 4.0–10.5)
nRBC: 0 % (ref 0.0–0.2)

## 2022-04-26 LAB — BASIC METABOLIC PANEL
Anion gap: 11 (ref 5–15)
BUN: 16 mg/dL (ref 8–23)
CO2: 25 mmol/L (ref 22–32)
Calcium: 9.1 mg/dL (ref 8.9–10.3)
Chloride: 97 mmol/L — ABNORMAL LOW (ref 98–111)
Creatinine, Ser: 0.68 mg/dL (ref 0.44–1.00)
GFR, Estimated: >60 mL/min (ref >60)
Glucose, Bld: 131 mg/dL — ABNORMAL HIGH (ref 70–99)
Potassium: 3.4 mmol/L — ABNORMAL LOW (ref 3.5–5.1)
Sodium: 133 mmol/L — ABNORMAL LOW (ref 135–145)

## 2022-04-26 LAB — PROTIME-INR
INR: 1.4 — ABNORMAL HIGH (ref 0.8–1.2)
Prothrombin Time: 17.1 seconds — ABNORMAL HIGH (ref 11.4–15.2)

## 2022-04-26 LAB — MAGNESIUM: Magnesium: 2.2 mg/dL (ref 1.7–2.4)

## 2022-04-26 LAB — SURGICAL PATHOLOGY

## 2022-04-26 MED ORDER — WARFARIN SODIUM 5 MG PO TABS
5.0000 mg | ORAL_TABLET | Freq: Once | ORAL | Status: AC
  Administered 2022-04-26: 5 mg via ORAL
  Filled 2022-04-26: qty 1

## 2022-04-26 MED ORDER — POTASSIUM CHLORIDE CRYS ER 20 MEQ PO TBCR
40.0000 meq | EXTENDED_RELEASE_TABLET | ORAL | Status: AC
  Administered 2022-04-26 (×2): 40 meq via ORAL
  Filled 2022-04-26 (×2): qty 2

## 2022-04-26 MED ORDER — SODIUM CHLORIDE 0.9 % IV SOLN: INTRAVENOUS | Status: DC | PRN

## 2022-04-26 NOTE — Progress Notes (Signed)
[facility] SURGICAL PROGRESS NOTE  Hospital Day(s): 3.   Post op day(s): 1 Day Post-Op.   Interval History:  Patient seen and examined No acute events or new complaints overnight.  Patient reports she is feeling better; sensitivity to the tape No fever, chills She remains without leukocytosis; 8.3K Mild hypokalemia to 3.4 Renal function normal; sCr - 0.68 Cx from OR growing GPC in clusters; previous Cx growing staph She continues on Cefazolin; ID following   Vital signs in last 24 hours: [min-max] current  Temp:  [97 F (36.1 C)-98 F (36.7 C)] 97.9 F (36.6 C) (06/23 6578) Pulse Rate:  [58-82] 61 (06/23 0613) Resp:  [10-20] 20 (06/23 0613) BP: (120-146)/(67-85) 145/83 (06/23 0613) SpO2:  [94 %-100 %] 99 % (06/23 4696) Weight:  [79.4 kg] 79.4 kg (06/22 1111)     Height: 5\' 6"  (167.6 cm) Weight: 79.4 kg BMI (Calculated): 28.27   Intake/Output last 2 shifts:  06/22 0701 - 06/23 0700 In: 1380 [P.O.:780; I.V.:600] Out: -    Physical Exam:  Constitutional: alert, cooperative and no distress  Respiratory: breathing non-labored at rest  Cardiovascular: regular rate and sinus rhythm  Right breast: Chaperone present; 5 x 2.5 x 2.5 cm wound to the upper outer quadrant of the right breast, erythema improving, wound bed healthy. Dressing changed   Labs:     Latest Ref Rng & Units 04/26/2022    5:50 AM 04/25/2022    6:02 AM 04/24/2022    5:35 AM  CBC  WBC 4.0 - 10.5 K/uL 8.3  6.6  6.0   Hemoglobin 12.0 - 15.0 g/dL 29.5  28.4  13.2   Hematocrit 36.0 - 46.0 % 35.1  37.3  33.2   Platelets 150 - 400 K/uL 309  286  256       Latest Ref Rng & Units 04/26/2022    5:50 AM 04/25/2022    6:02 AM 04/23/2022   10:44 AM  CMP  Glucose 70 - 99 mg/dL 440  102  725   BUN 8 - 23 mg/dL 16  11  15    Creatinine 0.44 - 1.00 mg/dL 3.66  4.40  3.47   Sodium 135 - 145 mmol/L 133  136  133   Potassium 3.5 - 5.1 mmol/L 3.4  3.3  4.0   Chloride 98 - 111 mmol/L 97  102  103   CO2 22  - 32 mmol/L 25  27  21    Calcium 8.9 - 10.3 mg/dL 9.1  9.2  9.0      Imaging studies: No new pertinent imaging studies   Assessment/Plan:  81 y.o. female 1 Day Post-Op s/p excisional debridement of right breast upper outer quadrant abscess   - Wound Care: Pack wound daily with saline moistened iodoform gauze, cover with xeroform. I elected to dover with dry gauze and mediplex dressing as she has a lot of sensitivity to tape on the skin in this area.    - Continue IV Abx; on Cefazolin; ID following; appreciate help and recommendations   - Pain control prn  - Further management per primary service  - Discharge Planning; From a surgical side, wound care at home will be biggest barrier. She believes her husband can help, but as a precaution I will put in [facility] orders today. She can follow up every 2 weeks in the office for wound checks + as needed   All of the above findings and recommendations were discussed with the patient, and the medical team,  and all of patient's questions were answered to her expressed satisfaction.  -- Lynden Oxford, PA-C [facility] 04/26/2022, 7:58 AM M-F: 7am - 4pm

## 2022-04-26 NOTE — TOC Initial Note (Signed)
Transition of Care [facility]) - Initial/Assessment Note    Patient Details  Name: Carol [name] MRN: [id number] Date of Birth: December 21, 1940  [facility] CM/SW Contact:    Gildardo Griffes, LCSW Phone Number: 04/26/2022, 1:12 PM  Clinical Narrative:                  CSW spoke with patient regarding discharge planning, patient is agreeable to recommended [facility] RN and reports she was active with [facility] which she would like to resume at discharge.   CSW has reached out to [place] with [facility] to confirm above and ensure she can take back.   Patient continues to see Dr. Darrick Huntsman as PCP, reports no further needs. Has spousal support from husband Carol [name].   Expected Discharge Plan: Home w Home Health Services Barriers to Discharge: Continued Medical Work up   Patient Goals and CMS Choice Patient states their goals for this hospitalization and ongoing recovery are:: to go home CMS Medicare.gov Compare Post Acute Care list provided to:: Patient Choice offered to / list presented to : Patient  Expected Discharge Plan and Services Expected Discharge Plan: Home w Home Health Services     Post Acute Care Choice: Home Health Living arrangements for the past 2 months: Single Family Home                           HH Arranged: RN HH Agency: [facility] spoke with at [facility] Agency: [facility]  Prior Living Arrangements/Services Living arrangements for the past 2 months: Single Family Home Lives with:: Spouse   Do you feel safe going back to the place where you live?: Yes               Activities of Daily Living Home Assistive Devices/Equipment: None ADL Screening (condition at time of admission) Patient's cognitive ability adequate to safely complete daily activities?: Yes Is the patient deaf or have difficulty hearing?: No Does the patient have difficulty seeing, even when wearing glasses/contacts?: No Does the patient have  difficulty concentrating, remembering, or making decisions?: No Patient able to express need for assistance with ADLs?: Yes Does the patient have difficulty dressing or bathing?: Yes Independently performs ADLs?: No Communication: Independent Dressing (OT): Independent Grooming: Independent Feeding: Independent Bathing: Independent Toileting: Independent In/Out Bed: Independent Walks in Home: Independent Does the patient have difficulty walking or climbing stairs?: No Weakness of Legs: None Weakness of Arms/Hands: None  Permission Sought/Granted Permission sought to share information with : Case Manager, Magazine features editor, Family Supports                Emotional Assessment   Attitude/Demeanor/Rapport: Gracious Affect (typically observed): Calm Orientation: : Oriented to Self, Oriented to  Time, Oriented to Place, Oriented to Situation Alcohol / Substance Use: Not Applicable Psych Involvement: No (comment)  Admission diagnosis:  Abscess of right breast [N61.1] Cellulitis of right breast [N61.0] Patient Active Problem List   Diagnosis Date Noted   Cellulitis of right breast 04/23/2022   Chronic systolic CHF (congestive heart failure) (HCC) 04/23/2022   Melanoma of skin (HCC) 03/13/2022   Severe sepsis (HCC) 03/05/2022   Warfarin anticoagulation 03/05/2022   Sepsis (HCC) 03/05/2022   (HFpEF) heart failure with preserved ejection fraction (HCC) 03/05/2022   COVID-19 virus infection 03/04/2022   AKI (acute kidney injury) with anion gap metabolic acidosis (HCC) 03/04/2022   Transaminitis 10/16/2021   Hospital discharge  follow-up 10/16/2021   A-fib (HCC) 10/08/2021   Atrial fibrillation with RVR (HCC) 10/07/2021   Bronchitis 09/21/2021   Shingles (herpes zoster) polyneuropathy 03/21/2021   CHF exacerbation (HCC) 01/29/2021   Leg edema 01/29/2021   Hyponatremia 01/29/2021   Acute cough 10/31/2020   Ill-defined cerebrovascular disease 10/12/2020   Statin  intolerance 01/08/2018   Hepatic steatosis 07/23/2017   Neck pain 03/28/2017   Surgical wound infection 11/28/2016   Encounter for preventive health examination 11/30/2015   Acquired thrombophilia (HCC) 05/16/2015   Screening for colon cancer 11/07/2014   S/P TAH-BSO (total abdominal hysterectomy and bilateral salpingo-oophorectomy) 11/07/2014   History of breast cancer 06/06/2014   Medicare annual wellness visit, subsequent 05/17/2014   Lymphoma of lymph nodes of head, face, and/or neck (HCC) 08/15/2012   Malignant neoplasm of orbit (HCC) 08/12/2012   Atrial fibrillation s/p ablation 2022, s/p cardioversion 02/2022 (HCC) 04/16/2012   Lichen planus    Irritable bowel syndrome    Moderate mitral insufficiency    Mild tricuspid insufficiency    Hyperlipidemia    PCP:  Sherlene Shams, MD Pharmacy:   [facility] [address] [place] - [address] [place] [address] Phone: [phone] Fax: [phone]  [facility] [place], [place] - [facility] AT Portal to Registered [facility] [address] [address] Phone: [phone] Fax: [phone]     Social Determinants of Health (SDOH) Interventions    Readmission Risk Interventions     No data to display

## 2022-04-27 DIAGNOSIS — N61 Mastitis without abscess: Secondary | ICD-10-CM | POA: Diagnosis not present

## 2022-04-27 LAB — PROTIME-INR
INR: 1.6 — ABNORMAL HIGH (ref 0.8–1.2)
Prothrombin Time: 18.9 seconds — ABNORMAL HIGH (ref 11.4–15.2)

## 2022-04-27 LAB — BASIC METABOLIC PANEL
Anion gap: 7 (ref 5–15)
BUN: 18 mg/dL (ref 8–23)
CO2: 27 mmol/L (ref 22–32)
Calcium: 9.3 mg/dL (ref 8.9–10.3)
Chloride: 100 mmol/L (ref 98–111)
Creatinine, Ser: 0.84 mg/dL (ref 0.44–1.00)
GFR, Estimated: >60 mL/min (ref >60)
Glucose, Bld: 108 mg/dL — ABNORMAL HIGH (ref 70–99)
Potassium: 3.8 mmol/L (ref 3.5–5.1)
Sodium: 134 mmol/L — ABNORMAL LOW (ref 135–145)

## 2022-04-27 LAB — CBC
HCT: 36 % (ref 36.0–46.0)
Hemoglobin: 12.1 g/dL (ref 12.0–15.0)
MCH: 31.3 pg (ref 26.0–34.0)
MCHC: 33.6 g/dL (ref 30.0–36.0)
MCV: 93 fL (ref 80.0–100.0)
Platelets: 311 10*3/uL (ref 150–400)
RBC: 3.87 MIL/uL (ref 3.87–5.11)
RDW: 14.6 % (ref 11.5–15.5)
WBC: 7.6 10*3/uL (ref 4.0–10.5)
nRBC: 0 % (ref 0.0–0.2)

## 2022-04-27 LAB — MAGNESIUM: Magnesium: 2.2 mg/dL (ref 1.7–2.4)

## 2022-04-27 MED ORDER — GABAPENTIN 100 MG PO CAPS: 100.0000 mg | ORAL_CAPSULE | ORAL | Status: DC

## 2022-04-27 MED ORDER — WARFARIN SODIUM 5 MG PO TABS
5.0000 mg | ORAL_TABLET | Freq: Once | ORAL | Status: DC
  Filled 2022-04-27: qty 1

## 2022-04-27 MED ORDER — DICLOXACILLIN SODIUM 500 MG PO CAPS
500.0000 mg | ORAL_CAPSULE | Freq: Four times a day (QID) | ORAL | 0 refills | Status: DC
Start: 2022-04-27 — End: 2022-04-27

## 2022-04-27 MED ORDER — DICLOXACILLIN SODIUM 500 MG PO CAPS
500.0000 mg | ORAL_CAPSULE | Freq: Four times a day (QID) | ORAL | Status: DC
  Filled 2022-04-27: qty 1

## 2022-04-27 MED ORDER — DICLOXACILLIN SODIUM 250 MG PO CAPS: 500.0000 mg | ORAL_CAPSULE | Freq: Four times a day (QID) | ORAL | 0 refills | Status: AC

## 2022-04-27 MED ORDER — DICLOXACILLIN SODIUM 500 MG PO CAPS
500.0000 mg | ORAL_CAPSULE | Freq: Four times a day (QID) | ORAL | Status: DC
  Administered 2022-04-27: 500 mg via ORAL
  Filled 2022-04-27 (×2): qty 1

## 2022-04-27 NOTE — Progress Notes (Signed)
Patient ID: Carol [name], female   DOB: 23-Apr-1941, 81 y.o.   MRN: [id number]     SURGICAL PROGRESS NOTE   Hospital Day(s): 4.   Interval History: Patient seen and examined, no acute events or new complaints overnight. Patient reports feeling well.  She endorses of the right breast pain is controlled.  She did have some flushing of her face.  Unsure if it was the antibiotics.  Denies any fevers.  Denies any difficulty breathing.  Vital signs in last 24 hours: [min-max] current  Temp:  [97.7 F (36.5 C)-98.5 F (36.9 C)] 97.7 F (36.5 C) (06/24 0746) Pulse Rate:  [54-58] 54 (06/24 0746) Resp:  [16-18] 18 (06/24 0746) BP: (122-152)/(72-77) 152/77 (06/24 0746) SpO2:  [95 %-100 %] 100 % (06/24 0746)     Height: 5\' 6"  (167.6 cm) Weight: 79.4 kg BMI (Calculated): 28.27   Physical Exam:  Constitutional: alert, cooperative and no distress  Respiratory: breathing non-labored at rest  Cardiovascular: regular rate and sinus rhythm  Breast: Right breast open wound with packing.  Adequate granulation tissue.  No purulence.  No ischemic tissue.  No cellulitis.  Labs:     Latest Ref Rng & Units 04/27/2022    7:21 AM 04/26/2022    5:50 AM 04/25/2022    6:02 AM  CBC  WBC 4.0 - 10.5 K/uL 7.6  8.3  6.6   Hemoglobin 12.0 - 15.0 g/dL 64.3  32.9  51.8   Hematocrit 36.0 - 46.0 % 36.0  35.1  37.3   Platelets 150 - 400 K/uL 311  309  286       Latest Ref Rng & Units 04/27/2022    7:21 AM 04/26/2022    5:50 AM 04/25/2022    6:02 AM  CMP  Glucose 70 - 99 mg/dL 841  660  630   BUN 8 - 23 mg/dL 18  16  11    Creatinine 0.44 - 1.00 mg/dL 1.60  1.09  3.23   Sodium 135 - 145 mmol/L 134  133  136   Potassium 3.5 - 5.1 mmol/L 3.8  3.4  3.3   Chloride 98 - 111 mmol/L 100  97  102   CO2 22 - 32 mmol/L 27  25  27    Calcium 8.9 - 10.3 mg/dL 9.3  9.1  9.2     Imaging studies: No new pertinent imaging studies   Assessment/Plan:  81 y.o. female with right breast abscess 2 Days Post-Op s/p cleansing  and debridement.   -Adequate healing process of the right breast open wound.  I personally changed the packing and instructed the husband to how to do it.  -From the surgical standpoint patient can be discharged once outpatient care is coordinated  -Patient will follow [facility] next week as outpatient   Gae Gallop, MD

## 2022-04-27 NOTE — Discharge Summary (Addendum)
Physician Discharge Summary   Carol [name]  female DOB: July 01, 1941  QBH:[id number]  PCP: Crecencio Mc, MD  Admit date: 04/23/2022 Discharge date: 04/27/2022  Admitted From: home Disposition:  home Husband updated at bedside prior to discharge. CODE STATUS: Full code  Discharge Instructions     Discharge wound care:   Complete by: As directed    Wound Care: Pack wound daily with saline moistened iodoform gauze, cover with xeroform. Cover with gauze and mediplex dressing.  Change daily.  Follow up with Dr. Christian Mate in 1 week. [facility] Course:  For full details, please see H&P, progress notes, consult notes and ancillary notes.  Briefly,  Carol [name] is a 81 y.o. female with medical history significant of sCHF, A-fib on amiodarone and Coumadin s/p ablation April 2022 and multiple cardioversions most recently 02/2022, low-grade B-cell lymphoma, IBS, breast cancer (s/p of right lumpectomy, chemotherapy), s/p of melanoma resection, who presented with right breast wound   Patient had resection of melanoma in right breast on 02/28/22, and developed cellulitis in the right breast. She was hospitalized from 5/1 - 5/9.  CT at that time showed cellulitis without abscess formation.  Superficial cx grew MRSA.  She was treated with vancomycin and cefepime in hospital and discharged 7 more days of doxycycline.  The wound worsened and pt was put on Augmentin by her dermatologist, still no improvement.  * Cellulitis of right breast With phlegmon and abscesses formation S/p OR I/D on 6/22 --Cellulitis of right breast With phlegmon and abscesses formation is a complication of the procedure (resection of melanoma). --returned with persistent infection, with US showing phlegmon formation.  GenSurg Dr. Christian Mate performed surgical I/D. --ID consulted and pt was put on Ancef during hospitalization and discharged on 12 more days of dicloxacillin. --Pt will continue  dressing change and wound packing daily per order (husband was taught to perform dressing change prior to discharge). --outpatient f/u with GenSurg 1 week after discharge.   Chronic systolic CHF (congestive heart failure) (HCC) 2D echo on 10/09/2021 showed EF of 35- 40%.  Patient has trace leg edema, BNP is slightly elevated 337, but no shortness of breath.  Patient does not seem to have CHF exacerbation. -Continue home Lasix 40 mg daily   Paroxysmal Atrial fibrillation s/p ablation 2022, s/p cardioversion 02/2022 ([place]) Heart rate controlled -Continue home metoprolol, amiodarone, diltiazem --Pt is on home warfarin.  INR was only 1.1 on presentation.  Discussed with pt about alternative anticoagulant, however, pt is not interested in switching, and wants to remain on warfarin.   Hyperlipidemia - Crestor   Discharge Diagnoses:  Principal Problem:   Cellulitis of right breast Active Problems:   Chronic systolic CHF (congestive heart failure) (HCC)   Atrial fibrillation s/p ablation 2022, s/p cardioversion 02/2022 [facility])   Hyperlipidemia   30 Day Unplanned Readmission Risk Score    Flowsheet Row ED to Hosp-Admission (Current) from 04/23/2022 in [facility]  30 Day Unplanned Readmission Risk Score (%) 25.65 Filed at 04/27/2022 0800       This score is the patient's risk of an unplanned readmission within 30 days of being discharged (0 -100%). The score is based on dignosis, age, lab data, medications, orders, and past utilization.   Low:  0-14.9   Medium: 15-21.9   High: 22-29.9   Extreme: 30 and above         Discharge Instructions:  Allergies as of 04/27/2022  Reactions   Prednisone Palpitations   A. fib   Pulmicort [budesonide] Shortness Of Breath, Itching, Swelling, Other (See Comments)   Felt as if things were crawly on her   Clonidine Other (See Comments)   Pt felt like things were crawling on her arms.   Lotemax [loteprednol  Etabonate]    Broke out around the eye   Morphine And Related Itching, Other (See Comments)   "creepy crawly feeling"   Trovan [alatrofloxacin Mesylate] Other (See Comments)   "effected the whole system"   Zelnorm [tegaserod Maleate] Itching   Felt as if things were crawly on her   Ancef [cefazolin] Rash   Erythromycin Rash        Medication List     STOP taking these medications    amoxicillin-clavulanate 875-125 MG tablet Commonly known as: AUGMENTIN       TAKE these medications    acetaminophen 500 MG tablet Commonly known as: TYLENOL Take 1,000 mg by mouth every 6 (six) hours as needed for moderate pain or headache.   amiodarone 200 MG tablet Commonly known as: PACERONE Take 200 mg by mouth daily.   b complex vitamins capsule Take 1 capsule by mouth daily.   bisacodyl 5 MG EC tablet Commonly known as: DULCOLAX Take 5 mg by mouth daily as needed for moderate constipation.   dicloxacillin 250 MG capsule Commonly known as: DYNAPEN Take 2 capsules (500 mg total) by mouth every 6 (six) hours for 12 days.   diltiazem 180 MG 24 hr capsule Commonly known as: DILACOR XR Take 180 mg by mouth daily.   diphenhydrAMINE 25 mg capsule Commonly known as: BENADRYL Take 25 mg by mouth every 6 (six) hours as needed for allergies.   docusate sodium 100 MG capsule Commonly known as: COLACE Take 100 mg by mouth daily as needed for mild constipation.   Fish Oil 1000 MG Caps Take 2,000 mg by mouth in the morning, at noon, and at bedtime.   furosemide 40 MG tablet Commonly known as: LASIX Take 1 tablet (40 mg total) by mouth daily. More if directed by physician   gabapentin 100 MG capsule Commonly known as: NEURONTIN Take 1-2 capsules (100-200 mg total) by mouth See admin instructions. Patient taking 100 mg in the morning and 200 mg at night.  Home med. What changed:  how much to take when to take this additional instructions   GLUCOSAMINE 1500 COMPLEX PO Take 1  tablet by mouth 2 (two) times daily.   MAGNESIUM CITRATE PO Take 400 mg by mouth 2 (two) times daily.   Melatonin 5 MG Caps Take 5 mg by mouth at bedtime as needed (sleep).   metoprolol succinate 50 MG 24 hr tablet Commonly known as: TOPROL-XL Take 50-100 mg by mouth See admin instructions. Take 100 mg in the morning and 50 mg at night   multivitamin with minerals Tabs tablet Take 0.5 tablets by mouth 2 (two) times daily.   omeprazole 20 MG capsule Commonly known as: PRILOSEC TAKE 1 CAPSULE DAILY   REFRESH DRY EYE THERAPY OP Place 1 drop into both eyes 2 (two) times daily as needed (dry eyes).   rosuvastatin 5 MG tablet Commonly known as: CRESTOR Take 5 mg by mouth 2 (two) times a week. Mondays and Thursdays   Vitamin D 50 MCG (2000 UT) tablet Take 2,000 Units by mouth daily.   vitamin E 1000 UNIT capsule Take 1,000 Units by mouth 2 (two) times daily.   warfarin 4 MG tablet  Commonly known as: COUMADIN Take 4 mg by mouth See admin instructions. Take 4 mg daily on Monday, Wednesday and Friday   warfarin 5 MG tablet Commonly known as: COUMADIN Take 5 mg by mouth 2 (two) times a week. Take 5 mg by mouth daily on Sunday, Tuesday, Thursday and Saturday               Discharge Care Instructions  (From admission, onward)           Start     Ordered   04/27/22 0000  Discharge wound care:       Comments: Wound Care: Pack wound daily with saline moistened iodoform gauze, cover with xeroform. Cover with gauze and mediplex dressing.  Change daily.  Follow up with Dr. Christian Mate in 1 week. - -   04/27/22 1025             Follow-up Information     Ronny Bacon, MD Follow up in 1 week(s).   Specialty: General Surgery Why: For wound re-check Contact information: [address] [place] [address] [phone]                 Allergies  Allergen Reactions   Prednisone Palpitations    A. fib   Pulmicort [Budesonide] Shortness  Of Breath, Itching, Swelling and Other (See Comments)    Felt as if things were crawly on her   Clonidine Other (See Comments)    Pt felt like things were crawling on her arms.   Lotemax [Loteprednol Etabonate]     Broke out around the eye   Morphine And Related Itching and Other (See Comments)    "creepy crawly feeling"   Trovan [Alatrofloxacin Mesylate] Other (See Comments)    "effected the whole system"   Zelnorm [Tegaserod Maleate] Itching    Felt as if things were crawly on her    Ancef [Cefazolin] Rash   Erythromycin Rash     The results of significant diagnostics from this hospitalization (including imaging, microbiology, ancillary and laboratory) are listed below for reference.   Consultations:   Procedures/Studies: US BREAST LTD UNI RIGHT INC AXILLA  Result Date: 04/23/2022 CLINICAL DATA:  81 year old presenting with burning pain in the RIGHT breast. 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 She presents now with worsening pain. She was given a prescription for Augmentin 3 days ago. Personal history of malignant RIGHT breast lumpectomy in July, 2015 at [facility]. EXAM: ULTRASOUND OF THE RIGHT BREAST COMPARISON:  RIGHT breast ultrasound 05/18/2014 at the time of breast cancer diagnosis. No interval breast imaging at [facility]. She states that she receives her breast imaging at [facility]. FINDINGS: Targeted ultrasound is performed, showing a heterogeneous mixed solid and fluid collection in the superficial tissues at the 11:30 o'clock position 6 cm from the nipple measuring approximately 2.6 x 1.6 x 2.2 cm, demonstrating posterior acoustic enhancement and mild hyperemia on power Doppler flow. There is skin thickening/edema and there is edema in the surrounding breast tissues. IMPRESSION: Approximate 2.6 cm phlegmon in the superficial tissues of  the RIGHT breast at the 11:30 o'clock position 6 cm from the nipple. This has not yet liquified into an abscess and is not a drainable collection. RECOMMENDATION: 1. Antibiotic therapy. 2. If symptoms persist, follow-up RIGHT breast ultrasound in 2 weeks after completion of antibiotic therapy. 3. Annual mammography for which the  patient currently goes to [facility]. BI-RADS CATEGORY  2: Benign. Electronically Signed   By: Evangeline Dakin M.D.   On: 04/23/2022 13:28      Labs: BNP (last 3 results) Recent Labs    10/07/21 1553 04/23/22 1632  BNP 1,585.4* 662.9*   Basic Metabolic Panel: Recent Labs  Lab 04/23/22 1044 04/25/22 0602 04/26/22 0550 04/27/22 0721  NA 133* 136 133* 134*  K 4.0 3.3* 3.4* 3.8  CL 103 102 97* 100  CO2 21* '27 25 27  '$ GLUCOSE 128* 112* 131* 108*  BUN '15 11 16 18  '$ CREATININE 0.84 0.69 0.68 0.84  CALCIUM 9.0 9.2 9.1 9.3  MG  --  2.3 2.2 2.2   Liver Function Tests: No results for input(s): "AST", "ALT", "ALKPHOS", "BILITOT", "PROT", "ALBUMIN" in the last 168 hours. No results for input(s): "LIPASE", "AMYLASE" in the last 168 hours. No results for input(s): "AMMONIA" in the last 168 hours. CBC: Recent Labs  Lab 04/23/22 1044 04/24/22 0535 04/25/22 0602 04/26/22 0550 04/27/22 0721  WBC 7.6 6.0 6.6 8.3 7.6  HGB 12.2 11.2* 12.6 12.2 12.1  HCT 36.9 33.2* 37.3 35.1* 36.0  MCV 95.1 93.3 93.0 90.5 93.0  PLT 289 256 286 309 311   Cardiac Enzymes: No results for input(s): "CKTOTAL", "CKMB", "CKMBINDEX", "TROPONINI" in the last 168 hours. BNP: Invalid input(s): "POCBNP" CBG: No results for input(s): "GLUCAP" in the last 168 hours. D-Dimer No results for input(s): "DDIMER" in the last 72 hours. Hgb A1c No results for input(s): "HGBA1C" in the last 72 hours. Lipid Profile No results for input(s): "CHOL", "HDL", "LDLCALC", "TRIG", "CHOLHDL", "LDLDIRECT" in the last 72 hours. Thyroid function studies No results for input(s): "TSH", "T4TOTAL", "T3FREE",  "THYROIDAB" in the last 72 hours.  Invalid input(s): "FREET3" Anemia work up No results for input(s): "VITAMINB12", "FOLATE", "FERRITIN", "TIBC", "IRON", "RETICCTPCT" in the last 72 hours. Urinalysis    Component Value Date/Time   COLORURINE YELLOW 10/08/2021 1100   APPEARANCEUR CLEAR 10/08/2021 1100   LABSPEC 1.020 10/08/2021 1100   PHURINE 6.0 10/08/2021 1100   GLUCOSEU NEGATIVE 10/08/2021 1100   HGBUR TRACE (A) 10/08/2021 1100   BILIRUBINUR NEGATIVE 10/08/2021 1100   KETONESUR NEGATIVE 10/08/2021 1100   PROTEINUR NEGATIVE 10/08/2021 1100   NITRITE NEGATIVE 10/08/2021 1100   LEUKOCYTESUR SMALL (A) 10/08/2021 1100   Sepsis Labs Recent Labs  Lab 04/24/22 0535 04/25/22 0602 04/26/22 0550 04/27/22 0721  WBC 6.0 6.6 8.3 7.6   Microbiology Recent Results (from the past 240 hour(s))  Aerobic/Anaerobic Culture w Gram Stain (surgical/deep wound)     Status: None (Preliminary result)   Collection Time: 04/23/22  4:31 PM   Specimen: Wound  Result Value Ref Range Status   Specimen Description   Final    WOUND Performed at [facility], [address], [place], [place] [address]    Special Requests   Final    RT BREAST Performed at [facility], [address] [place], [place] [address]    Gram Stain   Final    NO WBC SEEN MODERATE GRAM POSITIVE COCCI IN PAIRS Performed at [facility], [place] [address] [place], [place] [address]    Culture   Final    MODERATE STAPHYLOCOCCUS AUREUS NO ANAEROBES ISOLATED; CULTURE IN PROGRESS FOR 5 DAYS    Report Status PENDING  Incomplete   Organism ID, Bacteria STAPHYLOCOCCUS AUREUS  Final      Susceptibility   Staphylococcus aureus - MIC*    CIPROFLOXACIN >=8  RESISTANT Resistant     ERYTHROMYCIN >=8 RESISTANT Resistant     GENTAMICIN <=0.5 SENSITIVE Sensitive     OXACILLIN 0.5 SENSITIVE Sensitive     TETRACYCLINE <=1 SENSITIVE Sensitive     VANCOMYCIN 1 SENSITIVE Sensitive     TRIMETH/SULFA 160 RESISTANT  Resistant     CLINDAMYCIN >=8 RESISTANT Resistant     RIFAMPIN <=0.5 SENSITIVE Sensitive     Inducible Clindamycin NEGATIVE Sensitive     * MODERATE STAPHYLOCOCCUS AUREUS  Aerobic/Anaerobic Culture w Gram Stain (surgical/deep wound)     Status: None (Preliminary result)   Collection Time: 04/23/22  7:57 PM   Specimen: Wound  Result Value Ref Range Status   Specimen Description   Final    WOUND RIGHT BREAST Performed at [facility], [address], [place], [place] [address]    Special Requests   Final    Normal Performed at [facility], [address] [place], [place] [address]    Gram Stain   Final    NO SQUAMOUS EPITHELIAL CELLS SEEN FEW WBC SEEN MODERATE GRAM POSITIVE COCCI Performed at [facility], [place] [address] [place], [place] [address]    Culture   Final    MODERATE STAPHYLOCOCCUS AUREUS NO ANAEROBES ISOLATED; CULTURE IN PROGRESS FOR 5 DAYS    Report Status PENDING  Incomplete   Organism ID, Bacteria STAPHYLOCOCCUS AUREUS  Final      Susceptibility   Staphylococcus aureus - MIC*    CIPROFLOXACIN >=8 RESISTANT Resistant     ERYTHROMYCIN >=8 RESISTANT Resistant     GENTAMICIN <=0.5 SENSITIVE Sensitive     OXACILLIN 0.5 SENSITIVE Sensitive     TETRACYCLINE <=1 SENSITIVE Sensitive     VANCOMYCIN 1 SENSITIVE Sensitive     TRIMETH/SULFA 160 RESISTANT Resistant     CLINDAMYCIN >=8 RESISTANT Resistant     RIFAMPIN <=0.5 SENSITIVE Sensitive     Inducible Clindamycin NEGATIVE Sensitive     * MODERATE STAPHYLOCOCCUS AUREUS  Culture, blood (Routine X 2) w Reflex to ID Panel     Status: None (Preliminary result)   Collection Time: 04/23/22 10:01 PM   Specimen: BLOOD  Result Value Ref Range Status   Specimen Description BLOOD RIGHT ASSIST CONTROL  Final   Special Requests   Final    BOTTLES DRAWN AEROBIC AND ANAEROBIC Blood Culture adequate volume   Culture   Final    NO GROWTH 4 DAYS Performed at [facility], [address] [place], [place] [address]    Report Status PENDING  Incomplete  Culture, blood (Routine X 2) w Reflex to ID Panel     Status: None (Preliminary result)   Collection Time: 04/23/22 10:10 PM   Specimen: BLOOD  Result Value Ref Range Status   Specimen Description BLOOD RIGHT HAND  Final   Special Requests   Final    BOTTLES DRAWN AEROBIC AND ANAEROBIC Blood Culture adequate volume   Culture   Final    NO GROWTH 4 DAYS Performed at [facility], [address] [place], [place] [address]    Report Status PENDING  Incomplete  Aerobic/Anaerobic Culture w Gram Stain (surgical/deep wound)     Status: None (Preliminary result)   Collection Time: 04/25/22 12:43 PM   Specimen: PATH Other; Tissue  Result Value Ref Range Status   Specimen Description   Final    ABSCESS Performed at [facility], [address], [place], [place] [address]    Special Requests  Final    RIGHT UPPER BREAST Performed at [facility], [address] [place], [place] [address]    Gram Stain   Final    NO WBC SEEN MODERATE GRAM POSITIVE COCCI IN CLUSTERS    Culture   Final    MODERATE STAPHYLOCOCCUS AUREUS SUSCEPTIBILITIES TO FOLLOW Performed at [facility], [place] [address], [place], [place] [address]    Report Status PENDING  Incomplete     Total time spend on discharging this patient, including the last patient exam, discussing the hospital stay, instructions for ongoing care as it relates to all pertinent caregivers, as well as preparing the medical discharge records, prescriptions, and/or referrals as applicable, is 40 minutes.    Enzo Bi, MD  [facility] 04/27/2022, 10:29 AM

## 2022-04-28 LAB — CULTURE, BLOOD (ROUTINE X 2)
Culture: NO GROWTH
Culture: NO GROWTH
Special Requests: ADEQUATE
Special Requests: ADEQUATE

## 2022-04-28 LAB — AEROBIC/ANAEROBIC CULTURE W GRAM STAIN (SURGICAL/DEEP WOUND)
Gram Stain: NONE SEEN
Gram Stain: NONE SEEN
Special Requests: NORMAL

## 2022-04-29 ENCOUNTER — Telehealth: Payer: Self-pay

## 2022-04-30 LAB — AEROBIC/ANAEROBIC CULTURE W GRAM STAIN (SURGICAL/DEEP WOUND): Gram Stain: NONE SEEN

## 2022-05-01 DIAGNOSIS — R791 Abnormal coagulation profile: Secondary | ICD-10-CM | POA: Diagnosis not present

## 2022-05-02 ENCOUNTER — Encounter: Payer: Self-pay | Admitting: Surgery

## 2022-05-02 ENCOUNTER — Ambulatory Visit (INDEPENDENT_AMBULATORY_CARE_PROVIDER_SITE_OTHER): Payer: Medicare Other | Admitting: Surgery

## 2022-05-02 VITALS — BP 115/80 | HR 59 | Temp 97.9°F | Wt 170.0 lb

## 2022-05-02 DIAGNOSIS — I5030 Unspecified diastolic (congestive) heart failure: Secondary | ICD-10-CM | POA: Diagnosis not present

## 2022-05-02 DIAGNOSIS — U071 COVID-19: Secondary | ICD-10-CM | POA: Diagnosis not present

## 2022-05-02 DIAGNOSIS — I482 Chronic atrial fibrillation, unspecified: Secondary | ICD-10-CM | POA: Diagnosis not present

## 2022-05-02 DIAGNOSIS — A4189 Other specified sepsis: Secondary | ICD-10-CM | POA: Diagnosis not present

## 2022-05-02 DIAGNOSIS — N61 Mastitis without abscess: Secondary | ICD-10-CM

## 2022-05-02 DIAGNOSIS — T8149XD Infection following a procedure, other surgical site, subsequent encounter: Secondary | ICD-10-CM | POA: Diagnosis not present

## 2022-05-02 DIAGNOSIS — L03818 Cellulitis of other sites: Secondary | ICD-10-CM | POA: Diagnosis not present

## 2022-05-02 DIAGNOSIS — Z09 Encounter for follow-up examination after completed treatment for conditions other than malignant neoplasm: Secondary | ICD-10-CM

## 2022-05-02 NOTE — Progress Notes (Signed)
[facility] POST-OP OFFICE VISIT  05/02/2022  HPI: Carol [name] is a 81 y.o. female 7 days s/p excisional debridement of a right upper breast abscess.  She has been home doing dressing changes with home health providing minimal assistance.  Denies fevers chills bleeding or pain.  Vital signs: BP 115/80   Pulse (!) 59   Temp 97.9 F (36.6 C) (Oral)   Wt 170 lb (77.1 kg)   SpO2 98%   BMI 27.44 kg/m    Physical Exam: Constitutional: She appears well at baseline.  Skin: The right upper breast wound has not changed much in appearance since the day I debrided it.  There is no evidence of deterioration, infectious drainage, erythema, malodor or necrosis.  Remove the old packing of iodoform, I then proceeded with excisional debridement utilizing a bone curette to excise 6 fibrinous exudates and nonadherent debris from the underlying cavity.  This was well-tolerated.  We then repacked the wound with Aquacel AG rope/packing strip.  Assessment/Plan: This is a 81 y.o. female 7 days s/p excisional debridement of right upper breast/chest wall abscess.  Patient Active Problem List   Diagnosis Date Noted   Cellulitis of right breast 19/75/8832   Chronic systolic CHF (congestive heart failure) ([place]) 04/23/2022   Melanoma of skin ([place]) 03/13/2022   Severe sepsis ([place]) 03/05/2022   Warfarin anticoagulation 03/05/2022   Sepsis ([place]) 03/05/2022   (HFpEF) heart failure with preserved ejection fraction ([place]) 03/05/2022   COVID-19 virus infection 03/04/2022   AKI (acute kidney injury) with anion gap metabolic acidosis ([place]) 54/98/2641   Transaminitis 10/16/2021   Hospital discharge follow-up 10/16/2021   A-fib ([place]) 10/08/2021   Atrial fibrillation with RVR ([place]) 10/07/2021   Bronchitis 09/21/2021   Shingles (herpes zoster) polyneuropathy 03/21/2021   CHF exacerbation ([place]) 01/29/2021   Leg edema 01/29/2021   Hyponatremia 01/29/2021   Acute cough 10/31/2020    Ill-defined cerebrovascular disease 10/12/2020   Statin intolerance 01/08/2018   Hepatic steatosis 07/23/2017   Neck pain 03/28/2017   Surgical wound infection 11/28/2016   Encounter for preventive health examination 11/30/2015   Acquired thrombophilia (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  This can be changed less frequently while keeping the wound moist in a homeostatic environment along with the addition of silver to help protect from infection.  This should be changed every 2 to 3 days depending upon the degree of wound moisture.  She may shower with the dressing off. Follow-up in 2 weeks to assess for progress.   Ronny Bacon M.D., FACS 05/02/2022, 3:53 PM

## 2022-05-02 NOTE — Patient Instructions (Signed)
You will need to change the packing every 2 days. You will not need to apply dry dressing over the area. Continue using the dressing cover as you have been. These can be purchased at [place] or any drug store.  You may allow soapy water to run over the wound. Once clean repack the wound.

## 2022-05-06 DIAGNOSIS — T8149XD Infection following a procedure, other surgical site, subsequent encounter: Secondary | ICD-10-CM | POA: Diagnosis not present

## 2022-05-06 DIAGNOSIS — L03818 Cellulitis of other sites: Secondary | ICD-10-CM | POA: Diagnosis not present

## 2022-05-06 DIAGNOSIS — I482 Chronic atrial fibrillation, unspecified: Secondary | ICD-10-CM | POA: Diagnosis not present

## 2022-05-06 DIAGNOSIS — I5030 Unspecified diastolic (congestive) heart failure: Secondary | ICD-10-CM | POA: Diagnosis not present

## 2022-05-06 DIAGNOSIS — A4189 Other specified sepsis: Secondary | ICD-10-CM | POA: Diagnosis not present

## 2022-05-06 DIAGNOSIS — U071 COVID-19: Secondary | ICD-10-CM | POA: Diagnosis not present

## 2022-05-08 ENCOUNTER — Ambulatory Visit (INDEPENDENT_AMBULATORY_CARE_PROVIDER_SITE_OTHER): Payer: Medicare Other | Admitting: Internal Medicine

## 2022-05-08 ENCOUNTER — Encounter: Payer: Self-pay | Admitting: Internal Medicine

## 2022-05-08 VITALS — BP 120/60 | HR 55 | Temp 97.7°F | Resp 21 | Ht 66.0 in | Wt 169.9 lb

## 2022-05-08 DIAGNOSIS — R944 Abnormal results of kidney function studies: Secondary | ICD-10-CM

## 2022-05-08 DIAGNOSIS — D6869 Other thrombophilia: Secondary | ICD-10-CM | POA: Diagnosis not present

## 2022-05-08 DIAGNOSIS — E871 Hypo-osmolality and hyponatremia: Secondary | ICD-10-CM

## 2022-05-08 DIAGNOSIS — I5022 Chronic systolic (congestive) heart failure: Secondary | ICD-10-CM

## 2022-05-08 DIAGNOSIS — I4891 Unspecified atrial fibrillation: Secondary | ICD-10-CM

## 2022-05-08 DIAGNOSIS — E782 Mixed hyperlipidemia: Secondary | ICD-10-CM

## 2022-05-08 DIAGNOSIS — R7401 Elevation of levels of liver transaminase levels: Secondary | ICD-10-CM

## 2022-05-08 DIAGNOSIS — K760 Fatty (change of) liver, not elsewhere classified: Secondary | ICD-10-CM | POA: Diagnosis not present

## 2022-05-08 DIAGNOSIS — T8149XA Infection following a procedure, other surgical site, initial encounter: Secondary | ICD-10-CM

## 2022-05-08 MED ORDER — GABAPENTIN 100 MG PO CAPS: 100.0000 mg | ORAL_CAPSULE | ORAL | 1 refills | Status: DC

## 2022-05-08 NOTE — Progress Notes (Signed)
Subjective:  Patient ID: Carol [name], female    DOB: [birth year]  Age: 81 y.o. MRN: [id number]  CC: The primary encounter diagnosis was Hyponatremia. Diagnoses of Chronic systolic CHF (congestive heart failure) ([place]), Transaminitis, Hepatic steatosis, Surgical wound infection, Acquired thrombophilia ([place]), Atrial fibrillation, unspecified type ([place]), Decreased GFR, and Mixed hyperlipidemia were also pertinent to this visit.   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  Her first admission was from May 1 to May 9   when she presented with severe sepsis syndrome in the setting of recent COVID positive illness.  She received Vancomycin and Cefipime  initially and was transitioned  to doxycycline for MRSA hat was cultured from the wound.  Culture grew MRA resistant to Septra clinda,  cipro  and erythromycin.  The wound continued to exhibit purulent drainage and her dermatologist changed her abs therapy to Augmentin.   She was readmitted on June 20 with persistent burning pain and drainage and ultrasound noted a 2.6 cm  phlegmon .  She was taken to surgery for debridement and deep tissue culture, which revealed the MSSA  empiric broad spectrum antibiotics were converted to  dicloxacillin for a  17 day course. And she was discharged on June 24 .  Her husband is managing the dressing changes of her breast wound Taking probiotics . Denies diarrhea .  Appetite is good.      Hyperlipidemia;  her last panel was  one year  ago  Shingles : she was also diagnosed with shingles involving the right breast.  And is Using gabapentin for shingles pain   Lab Results  Component Value Date   CHOL 223 (H) 04/11/2021   HDL 62.20 04/11/2021   LDLCALC 143 (H) 04/11/2021   LDLDIRECT 156.0 04/05/2020   TRIG 91.0 04/11/2021   CHOLHDL 4 04/11/2021        Outpatient Medications Prior to Visit  Medication Sig Dispense Refill   acetaminophen (TYLENOL) 500 MG tablet Take 1,000 mg by mouth every 6 (six) hours as needed for moderate pain or headache.     amiodarone (PACERONE) 200 MG tablet Take 200 mg by mouth daily.     b complex vitamins capsule Take 1 capsule by mouth daily.     bisacodyl (DULCOLAX) 5 MG EC tablet Take 5 mg by mouth daily as needed for moderate constipation.     Cholecalciferol (VITAMIN D) 50 MCG (2000 UT) tablet Take 2,000 Units by mouth daily.     dicloxacillin (DYNAPEN) 250 MG capsule Take 2 capsules (500 mg total) by mouth every 6 (six) hours for 12 days. 96 capsule 0   diltiazem (DILACOR XR) 180 MG 24 hr capsule Take 180 mg by mouth daily. (Patient not taking: Reported on 05/09/2022)     diphenhydrAMINE (BENADRYL) 25 mg capsule Take 25 mg by mouth every 6 (six) hours as needed for allergies.     docusate sodium (COLACE) 100 MG capsule Take 100  mg by mouth daily as needed for mild constipation.     furosemide (LASIX) 40 MG tablet Take 1 tablet (40 mg total) by mouth daily. More if directed by physician 90 tablet 1   Glucosamine-Chondroit-Vit C-Mn (GLUCOSAMINE 1500 COMPLEX PO) Take 1 tablet by mouth 2 (two) times daily.     Glycerin-Polysorbate 80 (REFRESH DRY EYE THERAPY OP) Place 1 drop into both eyes 2 (two) times daily as needed (dry eyes).     MAGNESIUM CITRATE PO Take 400 mg by mouth 2 (two) times daily.     Melatonin 5 MG CAPS Take 5 mg by mouth at bedtime as needed (sleep).     metoprolol succinate (TOPROL-XL) 50 MG 24 hr tablet Take 50-100 mg by mouth See admin instructions. Take 100 mg in the  morning and 50 mg at night     Multiple Vitamin (MULTIVITAMIN WITH MINERALS) TABS tablet Take 0.5 tablets by mouth 2 (two) times daily.     Omega-3 Fatty Acids (FISH OIL) 1000 MG CAPS Take 2,000 mg by mouth in the morning, at noon, and at bedtime.     omeprazole (PRILOSEC) 20 MG capsule TAKE 1 CAPSULE DAILY 90 capsule 3   rosuvastatin (CRESTOR) 5 MG tablet Take 5 mg by mouth 2 (two) times a week. Mondays and Thursdays     vitamin E 1000 UNIT capsule Take 1,000 Units by mouth 2 (two) times daily.     warfarin (COUMADIN) 4 MG tablet Take 4 mg by mouth See admin instructions. Take 4 mg daily on Monday, Wednesday and Friday     warfarin (COUMADIN) 5 MG tablet Take 5 mg by mouth 2 (two) times a week. Take 5 mg by mouth daily on Sunday, Tuesday, Thursday and Saturday     gabapentin (NEURONTIN) 100 MG capsule Take 1-2 capsules (100-200 mg total) by mouth See admin instructions. Patient taking 100 mg in the morning and 200 mg at night.  Home med.     No facility-administered medications prior to visit.    Review of Systems;  Patient denies headache, fevers, malaise, unintentional weight loss, skin rash, eye pain, sinus congestion and sinus pain, sore throat, dysphagia,  hemoptysis , cough, dyspnea, wheezing, chest pain, palpitations, orthopnea, edema, abdominal pain, nausea, melena, diarrhea, constipation, flank pain, dysuria, hematuria, urinary  Frequency, nocturia, numbness, tingling, seizures,  Focal weakness, Loss of consciousness,  Tremor, insomnia, depression, anxiety, and suicidal ideation.      Objective:  BP 120/60 (BP Location: Left Arm, Patient Position: Sitting, Cuff Size: Normal)   Pulse (!) 55   Temp 97.7 F (36.5 C) (Oral)   Resp (!) 21   Ht '5\' 6"'$  (1.676 m)   Wt 169 lb 14.4 oz (77.1 kg)   SpO2 99%   BMI 27.42 kg/m   BP Readings from Last 3 Encounters:  05/09/22 125/82  05/08/22 120/60  05/02/22 115/80    Wt Readings from Last 3 Encounters:  05/09/22 169 lb (76.7 kg)   05/08/22 169 lb 14.4 oz (77.1 kg)  05/02/22 170 lb (77.1 kg)    General appearance: alert, cooperative and appears stated age Ears: normal TM's and external ear canals both ears Throat: lips, mucosa, and tongue normal; teeth and gums normal Neck: no adenopathy, no carotid bruit, supple, symmetrical, trachea midline and thyroid not enlarged, symmetric, no tenderness/mass/nodules Back: symmetric, no curvature. ROM normal. No CVA tenderness. Breast:  not examined  Lungs: clear to auscultation bilaterally Heart: regular rate and rhythm, S1, S2 normal, no murmur, click, rub  or gallop Abdomen: soft, non-tender; bowel sounds normal; no masses,  no organomegaly Pulses: 2+ and symmetric Skin: Skin color, texture, turgor normal. No rashes or lesions Lymph nodes: Cervical, supraclavicular, and axillary nodes normal.  Lab Results  Component Value Date   HGBA1C 5.7 (H) 10/08/2021   HGBA1C 5.7 (H) 10/07/2021   HGBA1C 5.7 05/27/2016    Lab Results  Component Value Date   CREATININE 1.06 05/08/2022   CREATININE 0.84 04/27/2022   CREATININE 0.68 04/26/2022    Lab Results  Component Value Date   WBC 7.6 04/27/2022   HGB 12.1 04/27/2022   HCT 36.0 04/27/2022   PLT 311 04/27/2022   GLUCOSE 97 05/08/2022   CHOL 223 (H) 04/11/2021   TRIG 91.0 04/11/2021   HDL 62.20 04/11/2021   LDLDIRECT 156.0 04/05/2020   LDLCALC 143 (H) 04/11/2021   ALT 20 05/08/2022   AST 30 05/08/2022   NA 133 (L) 05/08/2022   K 4.0 05/08/2022   CL 97 05/08/2022   CREATININE 1.06 05/08/2022   BUN 19 05/08/2022   CO2 26 05/08/2022   TSH 2.704 10/07/2021   INR 1.6 (H) 04/27/2022   HGBA1C 5.7 (H) 10/08/2021    US BREAST LTD UNI RIGHT INC AXILLA  Result Date: 04/23/2022 CLINICAL DATA:  81 year old presenting with burning pain in the RIGHT breast. In May, 2023 she had a melanoma removed from the RIGHT breast and developed cellulitis and a superficial abscess thereafter for which she underwent IV antibiotic  therapy. She presents now with worsening pain. She was given a prescription for Augmentin 3 days ago. Personal history of malignant RIGHT breast lumpectomy in July, 2015 at [facility]. EXAM: ULTRASOUND OF THE RIGHT BREAST COMPARISON:  RIGHT breast ultrasound 05/18/2014 at the time of breast cancer diagnosis. No interval breast imaging at [facility]. She states that she receives her breast imaging at [facility]. FINDINGS: Targeted ultrasound is performed, showing a heterogeneous mixed solid and fluid collection in the superficial tissues at the 11:30 o'clock position 6 cm from the nipple measuring approximately 2.6 x 1.6 x 2.2 cm, demonstrating posterior acoustic enhancement and mild hyperemia on power Doppler flow. There is skin thickening/edema and there is edema in the surrounding breast tissues. IMPRESSION: Approximate 2.6 cm phlegmon in the superficial tissues of the RIGHT breast at the 11:30 o'clock position 6 cm from the nipple. This has not yet liquified into an abscess and is not a drainable collection. RECOMMENDATION: 1. Antibiotic therapy. 2. If symptoms persist, follow-up RIGHT breast ultrasound in 2 weeks after completion of antibiotic therapy. 3. Annual mammography for which the patient currently goes to [facility]. BI-RADS CATEGORY  2: Benign. Electronically Signed   By: Evangeline Dakin M.D.   On: 04/23/2022 13:28    Assessment & Plan:   Problem List Items Addressed This Visit     Acquired thrombophilia ([place])    secondary to chronic atrial fibrillatin. Patient has no signs of bleeding and is advised to notify her specialists prior to any procedure that may required suspension of warfarin      Hyperlipidemia   Relevant Orders   Lipid panel   Hyponatremia - Primary   Relevant Orders   Comprehensive metabolic panel (Completed)   Atrial fibrillation s/p ablation 2022, s/p cardioversion 02/2022 [facility])    She continues to have paroxysmal AF despite cardioversions and ablations.  Currently  maintained in SR with amiodarone       Chronic systolic CHF (congestive heart failure) (HCC)    HER EF was  35 % by Dec 2022 ECHO . She is using furosemide       Hepatic steatosis    History of fatty liver.  Current Liver ultrasound done in house showed no acute changes. and all enzymes are either normalized or trending down based on repeat done today   Lab Results  Component Value Date   ALT 20 05/08/2022   AST 30 05/08/2022   ALKPHOS 63 05/08/2022   BILITOT 0.5 05/08/2022         RESOLVED: Transaminitis    History of fatty liver.  Current Liver ultrasound done in house showed no acute changes. and all enzymes are either normalized or trending down based on repeat done today   Lab Results  Component Value Date   ALT 20 05/08/2022   AST 30 05/08/2022   ALKPHOS 63 05/08/2022   BILITOT 0.5 05/08/2022      Lab Results  Component Value Date   ALT 20 05/08/2022   AST 30 05/08/2022   ALKPHOS 63 05/08/2022   BILITOT 0.5 05/08/2022         Surgical wound infection    Secondary to MSSA cultured from breast wound at site of melanoma excision that developed  Cellulitis and a 2.6 cm phlegmon which was excised during June admission.  Taking dicloxacillin and a probotic and following up with Infectious Disease.  Husband is managing her dressing changes       Other Visit Diagnoses     Decreased GFR       Relevant Orders   Basic metabolic panel       Follow-up: Return in about 6 months (around 11/08/2022).   Crecencio Mc, MD

## 2022-05-08 NOTE — Patient Instructions (Addendum)
For your pain :  I recommend that you take   2000 mg of acetominophen (tylenol) every day In divided doses (1000 mg every 12 hours.)    Stay on your probiotic    Avoid drenching sweats.  They can contaminate your wound

## 2022-05-09 ENCOUNTER — Encounter: Payer: Self-pay | Admitting: Infectious Diseases

## 2022-05-09 ENCOUNTER — Ambulatory Visit: Payer: Medicare Other | Attending: Infectious Diseases | Admitting: Infectious Diseases

## 2022-05-09 VITALS — BP 125/82 | HR 52 | Temp 97.3°F | Ht 66.5 in | Wt 169.0 lb

## 2022-05-09 DIAGNOSIS — A4901 Methicillin susceptible Staphylococcus aureus infection, unspecified site: Secondary | ICD-10-CM

## 2022-05-09 DIAGNOSIS — N611 Abscess of the breast and nipple: Secondary | ICD-10-CM | POA: Diagnosis not present

## 2022-05-09 LAB — COMPREHENSIVE METABOLIC PANEL
ALT: 20 U/L (ref 0–35)
AST: 30 U/L (ref 0–37)
Albumin: 4.2 g/dL (ref 3.5–5.2)
Alkaline Phosphatase: 63 U/L (ref 39–117)
BUN: 19 mg/dL (ref 6–23)
CO2: 26 mEq/L (ref 19–32)
Calcium: 9.4 mg/dL (ref 8.4–10.5)
Chloride: 97 mEq/L (ref 96–112)
Creatinine, Ser: 1.06 mg/dL (ref 0.40–1.20)
GFR: 49.21 mL/min — ABNORMAL LOW (ref >60.00)
Glucose, Bld: 97 mg/dL (ref 70–99)
Potassium: 4 mEq/L (ref 3.5–5.1)
Sodium: 133 mEq/L — ABNORMAL LOW (ref 135–145)
Total Bilirubin: 0.5 mg/dL (ref 0.2–1.2)
Total Protein: 6.3 g/dL (ref 6.0–8.3)

## 2022-05-09 NOTE — Progress Notes (Signed)
NAME: Carol [name]  DOB: [birth year]  MRN: [id number]  Date/Time: 05/09/2022 9:14 AM  Subjective:   ?follow up after recent hospitalization- here with her husband Shawniece Oyola is a 81 y.o. with a history of Right breast carcinoma status postlumpectomy and chemo in the past, lymphoma low-grade B cell, A-fib status post ablation and multiple cardioversions on Coumadin, melanoma excision from the skin on the right breast on 02/28/2022 was admitted initially to [facility] between 5 1 until 03/12/2022 for cellulitis without abscess formation superficial culture with Staph aureus then she was treated with initially with Vanco and cefepime and discharged on doxycycline.  She then saw her dermatologist who put her on Augmentin.  As it continued to get worse she was readmitted to [facility] between 04/23/2022 until 04/27/2022 for a nodular inflammatory lesion on the right breast area.      This was diagnosed to be Staph aureus.  She underwent debridement and excision of the inflammatory tissue on 04/25/2022.Marland Kitchen     Pathology did not show any malignancy.  Culture was Staph aureus.  After getting IV antibiotics she was discharged on dicloxacillin 1 g every 6 for 2 weeks.  She is here for follow-up.  She is doing better The wound is being packed by her husband She has loose stools occasionally but not on a regular basis.  She has no abdominal pain or fever or chills No rash.  Past Medical History:  Diagnosis Date   A-fib [facility])    Chronic sinusitis    Fibrocystic breast disease    History of pneumonia 1999   Hyperlipidemia    Irritable bowel syndrome    Lichen planus    lymphoma August 2013   Low grade B cell   Mild tricuspid insufficiency Jan 2012   ECHO, [place]   Moderate mitral insufficiency JAN 2012   ECHO, [place]    Past Surgical History:  Procedure Laterality Date   ABDOMINAL HYSTERECTOMY     precancerous cervix,     ABLATION OF DYSRHYTHMIC FOCUS  August 2013   [facility], Dr. Marcello Moores    BREAST SURGERY     bilateral, benign biopsies   CARDIOVERSION N/A 11/23/2020   Procedure: CARDIOVERSION;  Surgeon: Corey Skains, MD;  Location: [facility] ORS;  Service: Cardiovascular;  Laterality: N/A;   CARDIOVERSION N/A 01/17/2021   Procedure: CARDIOVERSION;  Surgeon: Corey Skains, MD;  Location: [facility] ORS;  Service: Cardiovascular;  Laterality: N/A;   CARDIOVERSION N/A 01/31/2021   Procedure: CARDIOVERSION;  Surgeon: Corey Skains, MD;  Location: [place];  Service: Cardiovascular;  Laterality: N/A;   CARDIOVERSION N/A 10/09/2021   Procedure: CARDIOVERSION;  Surgeon: Corey Skains, MD;  Location: [place] [facility];  Service: Cardiovascular;  Laterality: N/A;   CARDIOVERSION N/A 01/17/2022   Procedure: CARDIOVERSION;  Surgeon: Corey Skains, MD;  Location: [facility] ORS;  Service: Cardiovascular;  Laterality: N/A;   INCISION AND DRAINAGE ABSCESS Right 04/25/2022   Procedure: INCISION AND DRAINAGE ABSCESS;  Surgeon: Ronny Bacon, MD;  Location: [facility] ORS;  Service: General;  Laterality: Right;   MAXILLARY SINUS LIFT  1990's   [name]   oophorectomy     squamous cell removal Right    right leg calf area.   TEE WITHOUT CARDIOVERSION N/A 01/31/2021   Procedure: TRANSESOPHAGEAL ECHOCARDIOGRAM (TEE);  Surgeon: Corey Skains, MD;  Location: [facility] ORS;  Service: Cardiovascular;  Laterality: N/A;   TEE WITHOUT CARDIOVERSION N/A 10/09/2021   Procedure: TRANSESOPHAGEAL ECHOCARDIOGRAM (TEE);  Surgeon: Corey Skains, MD;  Location: [facility] OR[facility];  Service: Cardiovascular;  Laterality: N/A;    Social History   Socioeconomic History   Marital status: Married    Spouse name: [name]    Number of children: 2   Years of education: Not on file   Highest education level: Not on file  Occupational History   Not on file  Tobacco Use   Smoking status: Never   Smokeless tobacco: Never  Vaping Use   Vaping Use: Not on file  Substance and Sexual Activity   Alcohol use: Yes    Alcohol/week: 4.0  standard drinks of alcohol    Types: 4 Glasses of wine per week    Comment: Occ.   Drug use: No   Sexual activity: Not Currently    Birth control/protection: Post-menopausal  Other Topics Concern   Not on file  Social History Narrative   Lives at home with spouse    Social Determinants of Health   Financial Resource Strain: Low Risk  (09/20/2021)   Overall Financial Resource Strain (CARDIA)    Difficulty of Paying Living Expenses: Not hard at all  Food Insecurity: No Food Insecurity (09/20/2021)   Hunger Vital Sign    Worried About Running Out of Food in the Last Year: Never true    Ran Out of Food in the Last Year: Never true  Transportation Needs: No Transportation Needs (09/20/2021)   PRAPARE - Hydrologist (Medical): No    Lack of Transportation (Non-Medical): No  Physical Activity: Insufficiently Active (09/20/2021)   Exercise Vital Sign    Days of Exercise per Week: 5 days    Minutes of Exercise per Session: 20 min  Stress: No Stress Concern Present (09/20/2021)   [place]    Feeling of Stress : Not at all  Social Connections: Unknown (09/20/2021)   Social Connection and Isolation Panel [NHANES]    Frequency of Communication with Friends and Family: More than three times a week    Frequency of Social Gatherings with Friends and Family: More than three times a week    Attends Religious Services: Not on file    Active Member of [place] or Organizations: Not on file    Attends Archivist Meetings: Not on file    Marital Status: Married  Intimate Partner Violence: Not At Risk (09/20/2021)   Humiliation, Afraid, Rape, and Kick questionnaire    Fear of Current or Ex-Partner: No    Emotionally Abused: No    Physically Abused: No    Sexually Abused: No    Family History  Problem Relation Age of Onset   Cancer Mother        Breast   Hyperlipidemia Father    Heart  disease Father    Hypertension Father    Kidney disease Father    Cancer Maternal Grandfather        colon CA   Diverticulitis Sister    Allergies  Allergen Reactions   Prednisone Palpitations    A. fib   Pulmicort [Budesonide] Shortness Of Breath, Itching, Swelling and Other (See Comments)    Felt as if things were crawly on her   Clonidine Other (See Comments)    Pt felt like things were crawling on her arms.   Lotemax [Loteprednol Etabonate]     Broke out around the eye   Morphine And Related Itching and Other (See Comments)    "creepy crawly feeling"   IT consultant  Mesylate] Other (See Comments)    "effected the whole system"   Zelnorm [Tegaserod Maleate] Itching    Felt as if things were crawly on her    Ancef [Cefazolin] Rash   Erythromycin Rash   I? Current Outpatient Medications  Medication Sig Dispense Refill   acetaminophen (TYLENOL) 500 MG tablet Take 1,000 mg by mouth every 6 (six) hours as needed for moderate pain or headache.     amiodarone (PACERONE) 200 MG tablet Take 200 mg by mouth daily.     b complex vitamins capsule Take 1 capsule by mouth daily.     bisacodyl (DULCOLAX) 5 MG EC tablet Take 5 mg by mouth daily as needed for moderate constipation.     Cholecalciferol (VITAMIN D) 50 MCG (2000 UT) tablet Take 2,000 Units by mouth daily.     dicloxacillin (DYNAPEN) 250 MG capsule Take 2 capsules (500 mg total) by mouth every 6 (six) hours for 12 days. 96 capsule 0   diltiazem (CARDIZEM CD) 180 MG 24 hr capsule Take 180 mg by mouth daily.     diphenhydrAMINE (BENADRYL) 25 mg capsule Take 25 mg by mouth every 6 (six) hours as needed for allergies.     docusate sodium (COLACE) 100 MG capsule Take 100 mg by mouth daily as needed for mild constipation.     furosemide (LASIX) 40 MG tablet Take 1 tablet (40 mg total) by mouth daily. More if directed by physician 90 tablet 1   gabapentin (NEURONTIN) 100 MG capsule Take 1-2 capsules (100-200 mg total) by  mouth See admin instructions. Patient taking 100 mg in the morning and 200 mg at night.  Home med. 90 capsule 1   Glucosamine-Chondroit-Vit C-Mn (GLUCOSAMINE 1500 COMPLEX PO) Take 1 tablet by mouth 2 (two) times daily.     Glycerin-Polysorbate 80 (REFRESH DRY EYE THERAPY OP) Place 1 drop into both eyes 2 (two) times daily as needed (dry eyes).     MAGNESIUM CITRATE PO Take 400 mg by mouth 2 (two) times daily.     Melatonin 5 MG CAPS Take 5 mg by mouth at bedtime as needed (sleep).     metoprolol succinate (TOPROL-XL) 50 MG 24 hr tablet Take 50-100 mg by mouth See admin instructions. Take 100 mg in the morning and 50 mg at night     Multiple Vitamin (MULTIVITAMIN WITH MINERALS) TABS tablet Take 0.5 tablets by mouth 2 (two) times daily.     Omega-3 Fatty Acids (FISH OIL) 1000 MG CAPS Take 2,000 mg by mouth in the morning, at noon, and at bedtime.     omeprazole (PRILOSEC) 20 MG capsule TAKE 1 CAPSULE DAILY 90 capsule 3   rosuvastatin (CRESTOR) 5 MG tablet Take 5 mg by mouth 2 (two) times a week. Mondays and Thursdays     vitamin E 1000 UNIT capsule Take 1,000 Units by mouth 2 (two) times daily.     warfarin (COUMADIN) 4 MG tablet Take 4 mg by mouth See admin instructions. Take 4 mg daily on Monday, Wednesday and Friday     warfarin (COUMADIN) 5 MG tablet Take 5 mg by mouth 2 (two) times a week. Take 5 mg by mouth daily on Sunday, Tuesday, Thursday and Saturday     diltiazem (DILACOR XR) 180 MG 24 hr capsule Take 180 mg by mouth daily. (Patient not taking: Reported on 05/09/2022)     No current facility-administered medications for this visit.     Abtx:  Anti-infectives (From admission, onward)  None       REVIEW OF SYSTEMS:  Const: negative fever, negative chills, negative weight loss Eyes: negative diplopia or visual changes, negative eye pain ENT: negative coryza, negative sore throat Resp: negative cough, hemoptysis, dyspnea Cards: negative for chest pain, palpitations, lower  extremity edema GU: negative for frequency, dysuria and hematuria GI: Negative for abdominal pain, diarrhea, bleeding, constipation Skin: negative for rash and pruritus Heme: negative for easy bruising and gum/nose bleeding MS: negative for myalgias, arthralgias, back pain and muscle weakness Neurolo:negative for headaches, dizziness, vertigo, memory problems  Psych: negative for feelings of anxiety, depression  Endocrine: negative for thyroid, diabetes Allergy/Immunology-as above  Objective:  VITALS:  BP 125/82   Pulse (!) 52   Temp (!) 97.3 F (36.3 C) (Temporal)   Ht 5' 6.5" (1.689 m)   Wt 169 lb (76.7 kg)   BMI 26.87 kg/m   PHYSICAL EXAM:  General: Alert, cooperative, no distress, appears stated age.  Head: Normocephalic, without obvious abnormality, atraumatic. Eyes: Conjunctivae clear, anicteric sclerae. Pupils are equal ENT Nares normal. No drainage or sinus tenderness. Lips, mucosa, and tongue normal. No Thrush Neck: Supple, symmetrical, no adenopathy, thyroid: non tender no carotid bruit and no JVD. Back: No CVA tenderness. Lungs: Clear to auscultation bilaterally. No Wheezing or Rhonchi. No rales. Heart: Regular rate and rhythm, no murmur, rub or gallop. Abdomen: Soft, non-tender,not distended. Bowel sounds normal. No masses Extremities: atraumatic, no cyanosis. No edema. No clubbing Skin: rt breast- surgical wound healing well- no erythema aroun    Lymph: Cervical, supraclavicular normal. Neurologic: Grossly non-focal Pertinent Labs none  ? Impression/Recommendation ? MSSA abscess/phlegmon on the right breast below the site of a previous melanoma excision.  Status post incision and drainage and debridement done on 04/25/2022.  Patient is currently on dicloxacillin '500mg'$  po Q6( Not 1 gram pO Q6)- she will continue the same dose and complete it Surgical wound healing as expected- being packed Pt has a follow up appt with Dermatology and surgery   Will see her  back in a few weeks  Note:  This document was prepared using Dragon voice recognition software and may include unintentional dictation errors.

## 2022-05-09 NOTE — Patient Instructions (Signed)
You are here for follow up of the rt breats infection with staph aureus. You had I/D and you are on Diclox- you will reduce 1 gram diclox to '500mg'$  every 6 hrs. Continue to complete the treatment- follow up 1 month

## 2022-05-10 DIAGNOSIS — T8149XD Infection following a procedure, other surgical site, subsequent encounter: Secondary | ICD-10-CM | POA: Diagnosis not present

## 2022-05-10 DIAGNOSIS — I482 Chronic atrial fibrillation, unspecified: Secondary | ICD-10-CM | POA: Diagnosis not present

## 2022-05-10 DIAGNOSIS — L03818 Cellulitis of other sites: Secondary | ICD-10-CM | POA: Diagnosis not present

## 2022-05-10 DIAGNOSIS — U071 COVID-19: Secondary | ICD-10-CM | POA: Diagnosis not present

## 2022-05-10 DIAGNOSIS — A4189 Other specified sepsis: Secondary | ICD-10-CM | POA: Diagnosis not present

## 2022-05-10 DIAGNOSIS — I5030 Unspecified diastolic (congestive) heart failure: Secondary | ICD-10-CM | POA: Diagnosis not present

## 2022-05-12 DIAGNOSIS — T8149XA Infection following a procedure, other surgical site, initial encounter: Secondary | ICD-10-CM | POA: Diagnosis not present

## 2022-05-12 DIAGNOSIS — I5030 Unspecified diastolic (congestive) heart failure: Secondary | ICD-10-CM | POA: Diagnosis not present

## 2022-05-12 DIAGNOSIS — N611 Abscess of the breast and nipple: Secondary | ICD-10-CM | POA: Diagnosis not present

## 2022-05-12 DIAGNOSIS — C4352 Malignant melanoma of skin of breast: Secondary | ICD-10-CM | POA: Diagnosis not present

## 2022-05-12 DIAGNOSIS — C8300 Small cell B-cell lymphoma, unspecified site: Secondary | ICD-10-CM | POA: Diagnosis not present

## 2022-05-12 DIAGNOSIS — E871 Hypo-osmolality and hyponatremia: Secondary | ICD-10-CM | POA: Diagnosis not present

## 2022-05-12 DIAGNOSIS — B9689 Other specified bacterial agents as the cause of diseases classified elsewhere: Secondary | ICD-10-CM | POA: Diagnosis not present

## 2022-05-12 DIAGNOSIS — Z8616 Personal history of COVID-19: Secondary | ICD-10-CM | POA: Diagnosis not present

## 2022-05-12 DIAGNOSIS — B9562 Methicillin resistant Staphylococcus aureus infection as the cause of diseases classified elsewhere: Secondary | ICD-10-CM | POA: Diagnosis not present

## 2022-05-12 DIAGNOSIS — Z7901 Long term (current) use of anticoagulants: Secondary | ICD-10-CM | POA: Diagnosis not present

## 2022-05-12 DIAGNOSIS — I482 Chronic atrial fibrillation, unspecified: Secondary | ICD-10-CM | POA: Diagnosis not present

## 2022-05-12 DIAGNOSIS — I5022 Chronic systolic (congestive) heart failure: Secondary | ICD-10-CM | POA: Diagnosis not present

## 2022-05-12 DIAGNOSIS — L03313 Cellulitis of chest wall: Secondary | ICD-10-CM | POA: Diagnosis not present

## 2022-05-12 DIAGNOSIS — E785 Hyperlipidemia, unspecified: Secondary | ICD-10-CM | POA: Diagnosis not present

## 2022-05-12 NOTE — Assessment & Plan Note (Addendum)
Secondary to MSSA cultured from breast wound at site of melanoma excision that developed  Cellulitis and a 2.6 cm phlegmon which was excised during June admission.  Taking dicloxacillin and a probotic and following up with Infectious Disease.  Husband is managing her dressing changes

## 2022-05-12 NOTE — Assessment & Plan Note (Signed)
secondary to chronic atrial fibrillatin. Patient has no signs of bleeding and is advised to notify her specialists prior to any procedure that may required suspension of warfarin 

## 2022-05-12 NOTE — Assessment & Plan Note (Signed)
HER EF was 35 % by Dec 2022 ECHO . She is using furosemide  

## 2022-05-12 NOTE — Assessment & Plan Note (Signed)
She continues to have paroxysmal AF despite cardioversions and ablations.  Currently maintained in SR with amiodarone

## 2022-05-12 NOTE — Assessment & Plan Note (Signed)
History of fatty liver.  Current Liver ultrasound done in house showed no acute changes. and all enzymes are either normalized or trending down based on repeat done today   Lab Results  Component Value Date   ALT 20 05/08/2022   AST 30 05/08/2022   ALKPHOS 63 05/08/2022   BILITOT 0.5 05/08/2022

## 2022-05-12 NOTE — Assessment & Plan Note (Signed)
History of fatty liver.  Current Liver ultrasound done in house showed no acute changes. and all enzymes are either normalized or trending down based on repeat done today   Lab Results  Component Value Date   ALT 20 05/08/2022   AST 30 05/08/2022   ALKPHOS 63 05/08/2022   BILITOT 0.5 05/08/2022      Lab Results  Component Value Date   ALT 20 05/08/2022   AST 30 05/08/2022   ALKPHOS 63 05/08/2022   BILITOT 0.5 05/08/2022

## 2022-05-13 ENCOUNTER — Other Ambulatory Visit: Payer: Self-pay

## 2022-05-14 DIAGNOSIS — T8149XA Infection following a procedure, other surgical site, initial encounter: Secondary | ICD-10-CM | POA: Diagnosis not present

## 2022-05-14 DIAGNOSIS — B9562 Methicillin resistant Staphylococcus aureus infection as the cause of diseases classified elsewhere: Secondary | ICD-10-CM | POA: Diagnosis not present

## 2022-05-14 DIAGNOSIS — C4352 Malignant melanoma of skin of breast: Secondary | ICD-10-CM | POA: Diagnosis not present

## 2022-05-14 DIAGNOSIS — N611 Abscess of the breast and nipple: Secondary | ICD-10-CM | POA: Diagnosis not present

## 2022-05-14 DIAGNOSIS — B9689 Other specified bacterial agents as the cause of diseases classified elsewhere: Secondary | ICD-10-CM | POA: Diagnosis not present

## 2022-05-14 DIAGNOSIS — L03313 Cellulitis of chest wall: Secondary | ICD-10-CM | POA: Diagnosis not present

## 2022-05-15 DIAGNOSIS — R791 Abnormal coagulation profile: Secondary | ICD-10-CM | POA: Diagnosis not present

## 2022-05-16 ENCOUNTER — Ambulatory Visit (INDEPENDENT_AMBULATORY_CARE_PROVIDER_SITE_OTHER): Payer: Medicare Other | Admitting: Surgery

## 2022-05-16 ENCOUNTER — Encounter: Payer: Self-pay | Admitting: Surgery

## 2022-05-16 ENCOUNTER — Other Ambulatory Visit: Payer: Self-pay

## 2022-05-16 VITALS — BP 125/67 | HR 63 | Temp 98.2°F | Ht 66.5 in | Wt 170.0 lb

## 2022-05-16 DIAGNOSIS — N611 Abscess of the breast and nipple: Secondary | ICD-10-CM

## 2022-05-16 DIAGNOSIS — S21001D Unspecified open wound of right breast, subsequent encounter: Secondary | ICD-10-CM

## 2022-05-16 NOTE — Progress Notes (Addendum)
[facility] POST-OP OFFICE VISIT  05/16/2022  HPI: Carol [name] is a 81 y.o. female  s/p incision and drainage, and follow-up wound care of the right upper breast wound.  She is currently doing saline dressing changes with iodoform/plain packing strip.  We clarified to continue to do this with plain packing strip and discontinue the iodoform and discontinue with the overlying Xeroform.  Vital signs: There were no vitals taken for this visit.   Physical Exam: Constitutional: She appears well, at her baseline.  Skin: Her right upper breast wound measures 2.5 cm and medial to lateral with, 1 cm and cranial to caudad, and 1.8 cm in depth which is most immediately.  She has a small amount of adherent exudative debris, and fibrinous exudates.  With topical application of 4% Xylocaine ointment, I was able to successfully debride the skin and subcutaneous tissues with excisional technique utilizing a curette.  We obtained punctate bleeding.  There is minimal tissues that are not fully granulated.  Estimated granulation 95%.  No surrounding erythema, tunneling, purulent drainage or induration noted.  Assessment/Plan: This is a 80 y.o. female  s/p I&D right upper breast wound from abscess.  History of radiation to right breast.  Excisional debridement today of skin and subcutaneous tissues.  Patient Active Problem List   Diagnosis Date Noted   Chronic systolic CHF (congestive heart failure) ([place]) 04/23/2022   Melanoma of skin ([place]) 03/13/2022   Warfarin anticoagulation 03/05/2022   (HFpEF) heart failure with preserved ejection fraction ([place]) 03/05/2022   COVID-19 virus infection 03/04/2022   Hospital discharge follow-up 10/16/2021   Bronchitis 09/21/2021   Shingles (herpes zoster) polyneuropathy 03/21/2021   Leg edema 01/29/2021   Hyponatremia 01/29/2021   Ill-defined cerebrovascular disease 10/12/2020   Statin intolerance 01/08/2018   Hepatic steatosis 07/23/2017    Surgical wound infection 11/28/2016   Encounter for preventive health examination 11/30/2015   Acquired thrombophilia ([place]) 05/16/2015   Screening for colon cancer 11/07/2014   S/P TAH-BSO (total abdominal hysterectomy and bilateral salpingo-oophorectomy) 11/07/2014   History of breast cancer 06/06/2014   Medicare annual wellness visit, subsequent 05/17/2014   Lymphoma of lymph nodes of head, face, and/or neck ([place]) 08/15/2012   Malignant neoplasm of orbit ([place]) 08/12/2012   Atrial fibrillation s/p ablation 2022, s/p cardioversion 02/2022 ([place]) 64/68/0321   Lichen planus    Irritable bowel syndrome    Moderate mitral insufficiency    Mild tricuspid insufficiency    Hyperlipidemia     -We will continue saline dressing changes, discontinue Xeroform and iodoform gauze.  May follow-up in 1 month.   Ronny Bacon M.D., FACS 05/16/2022, 8:49 AM

## 2022-05-16 NOTE — Patient Instructions (Signed)
Continue the wound care daily.

## 2022-05-17 DIAGNOSIS — B9562 Methicillin resistant Staphylococcus aureus infection as the cause of diseases classified elsewhere: Secondary | ICD-10-CM | POA: Diagnosis not present

## 2022-05-17 DIAGNOSIS — N611 Abscess of the breast and nipple: Secondary | ICD-10-CM | POA: Diagnosis not present

## 2022-05-17 DIAGNOSIS — C4352 Malignant melanoma of skin of breast: Secondary | ICD-10-CM | POA: Diagnosis not present

## 2022-05-17 DIAGNOSIS — B9689 Other specified bacterial agents as the cause of diseases classified elsewhere: Secondary | ICD-10-CM | POA: Diagnosis not present

## 2022-05-17 DIAGNOSIS — L03313 Cellulitis of chest wall: Secondary | ICD-10-CM | POA: Diagnosis not present

## 2022-05-17 DIAGNOSIS — T8149XA Infection following a procedure, other surgical site, initial encounter: Secondary | ICD-10-CM | POA: Diagnosis not present

## 2022-05-21 DIAGNOSIS — C4352 Malignant melanoma of skin of breast: Secondary | ICD-10-CM | POA: Diagnosis not present

## 2022-05-21 DIAGNOSIS — T8149XA Infection following a procedure, other surgical site, initial encounter: Secondary | ICD-10-CM | POA: Diagnosis not present

## 2022-05-21 DIAGNOSIS — B9562 Methicillin resistant Staphylococcus aureus infection as the cause of diseases classified elsewhere: Secondary | ICD-10-CM | POA: Diagnosis not present

## 2022-05-21 DIAGNOSIS — N611 Abscess of the breast and nipple: Secondary | ICD-10-CM | POA: Diagnosis not present

## 2022-05-21 DIAGNOSIS — B9689 Other specified bacterial agents as the cause of diseases classified elsewhere: Secondary | ICD-10-CM | POA: Diagnosis not present

## 2022-05-21 DIAGNOSIS — L03313 Cellulitis of chest wall: Secondary | ICD-10-CM | POA: Diagnosis not present

## 2022-05-22 IMAGING — CR DG CHEST 2V
2 series · 3 of 3 positions shown · non-contrast
Comparison: 10/23/2021

CLINICAL DATA: Shortness of breath, history of recent melanoma
removal from the right breast

EXAM:
CHEST - 2 VIEW

[chest pa]
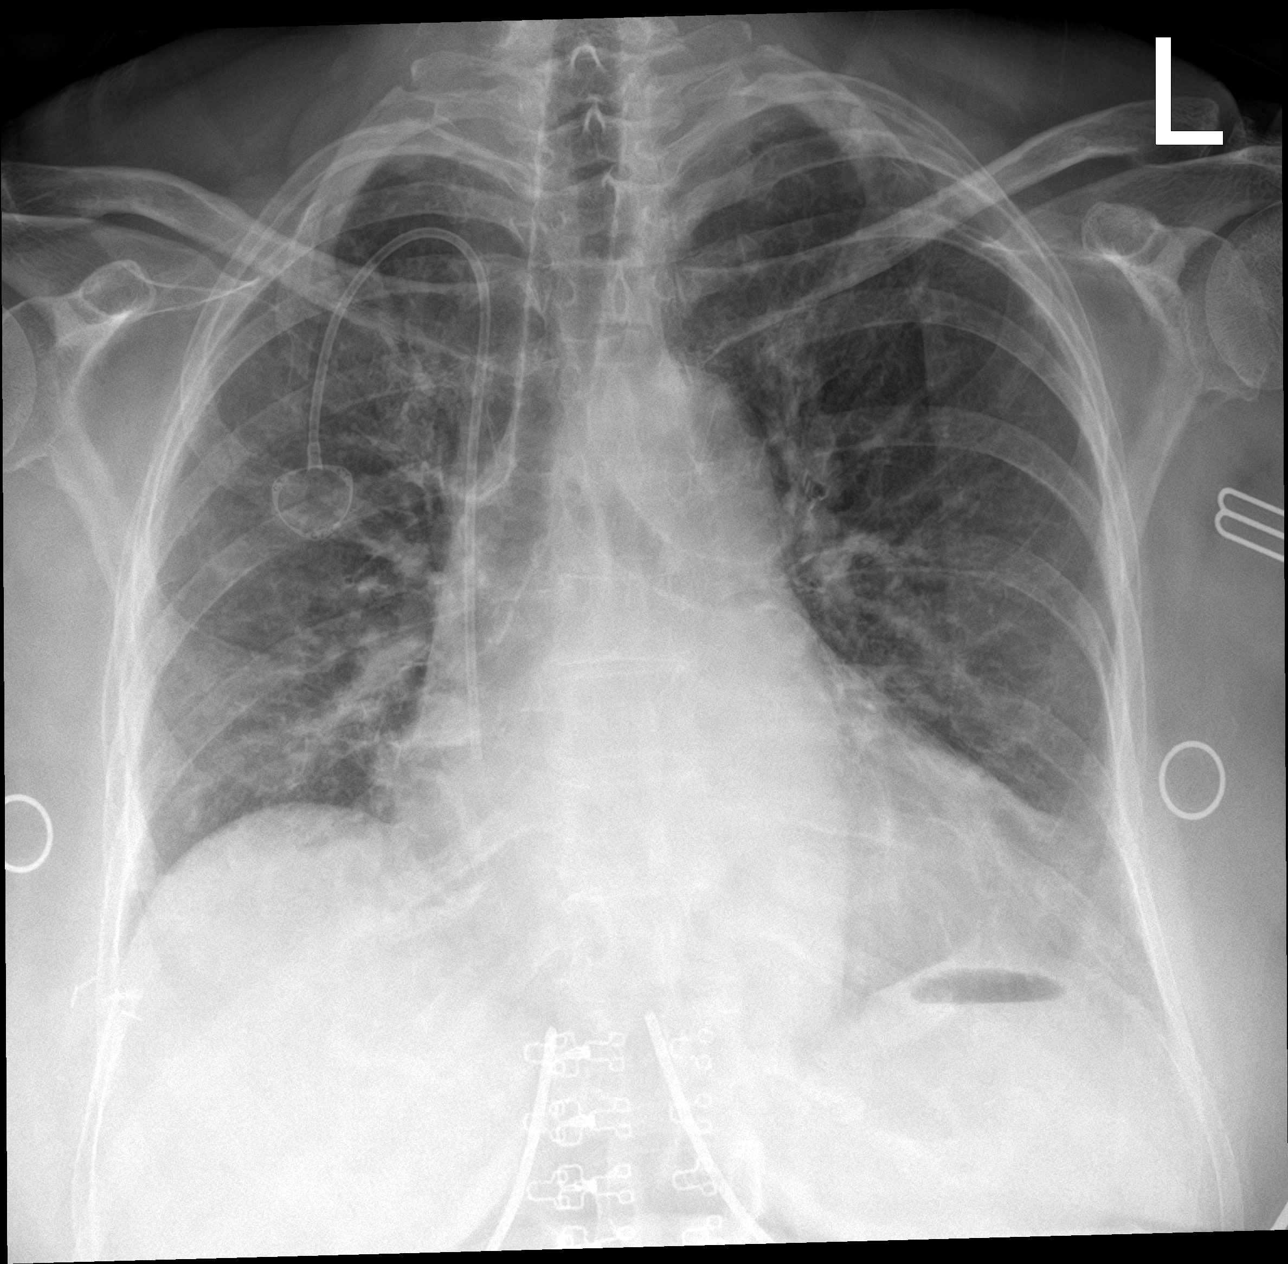

[Series 2: chest lat · 0.14mm/px · 2 of 2 slices shown]
[im 1/2]
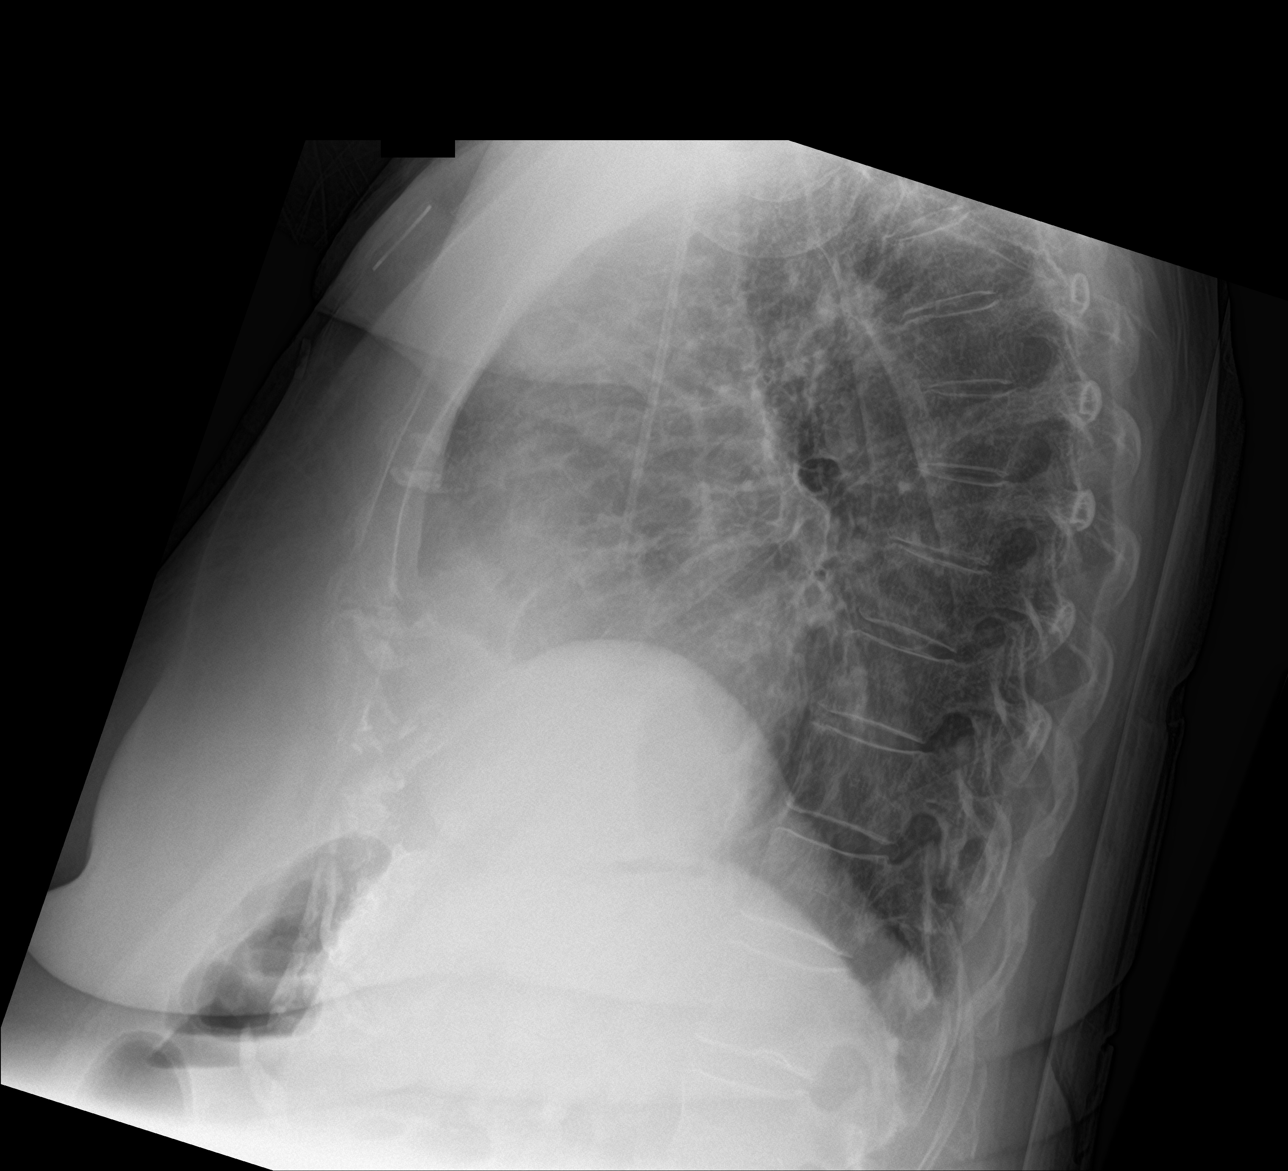
[im 2/2]
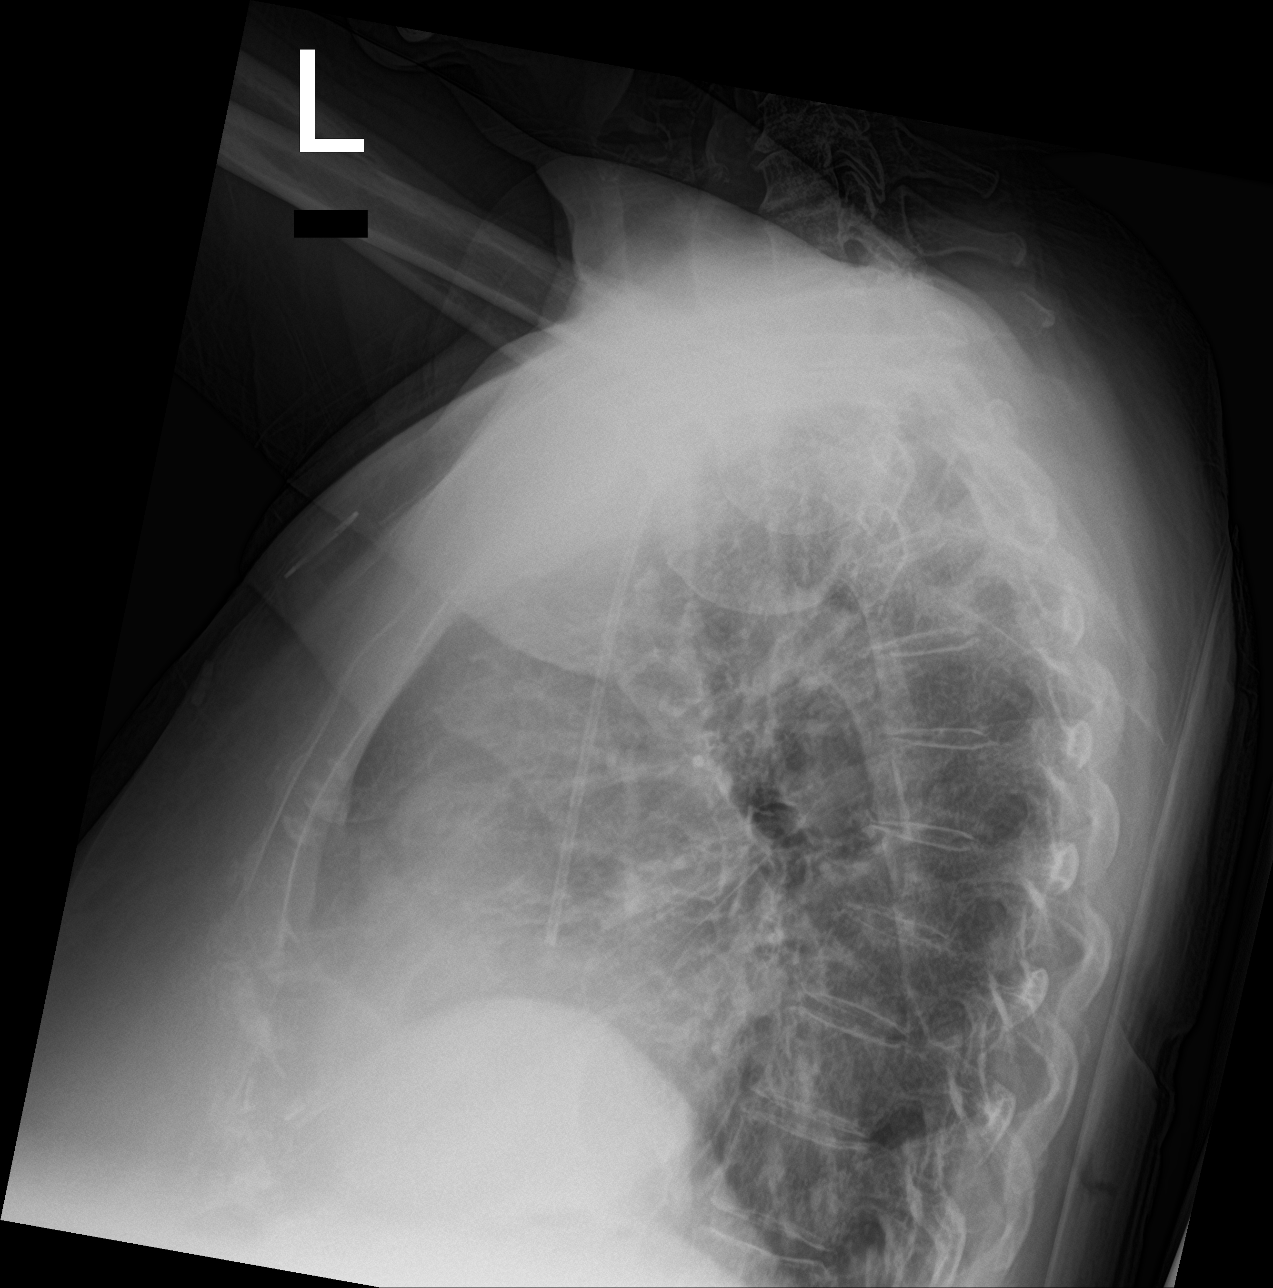

[3 of 3 positions shown; findings below may reference images not displayed]

FINDINGS: Cardiac shadow is enlarged but stable. Right chest wall port is seen
in satisfactory position. The lungs are well aerated bilaterally.
Elevation of the right hemidiaphragm is seen. No acute bony
abnormality is noted.
IMPRESSION: No acute abnormality noted.

## 2022-05-24 DIAGNOSIS — N611 Abscess of the breast and nipple: Secondary | ICD-10-CM | POA: Diagnosis not present

## 2022-05-24 DIAGNOSIS — T8149XA Infection following a procedure, other surgical site, initial encounter: Secondary | ICD-10-CM | POA: Diagnosis not present

## 2022-05-24 DIAGNOSIS — B9689 Other specified bacterial agents as the cause of diseases classified elsewhere: Secondary | ICD-10-CM | POA: Diagnosis not present

## 2022-05-24 DIAGNOSIS — L03313 Cellulitis of chest wall: Secondary | ICD-10-CM | POA: Diagnosis not present

## 2022-05-24 DIAGNOSIS — C4352 Malignant melanoma of skin of breast: Secondary | ICD-10-CM | POA: Diagnosis not present

## 2022-05-24 DIAGNOSIS — B9562 Methicillin resistant Staphylococcus aureus infection as the cause of diseases classified elsewhere: Secondary | ICD-10-CM | POA: Diagnosis not present

## 2022-05-28 DIAGNOSIS — C8300 Small cell B-cell lymphoma, unspecified site: Secondary | ICD-10-CM | POA: Diagnosis not present

## 2022-05-28 DIAGNOSIS — Z8616 Personal history of COVID-19: Secondary | ICD-10-CM

## 2022-05-28 DIAGNOSIS — B9689 Other specified bacterial agents as the cause of diseases classified elsewhere: Secondary | ICD-10-CM | POA: Diagnosis not present

## 2022-05-28 DIAGNOSIS — C4352 Malignant melanoma of skin of breast: Secondary | ICD-10-CM | POA: Diagnosis not present

## 2022-05-28 DIAGNOSIS — Z7901 Long term (current) use of anticoagulants: Secondary | ICD-10-CM

## 2022-05-28 DIAGNOSIS — E871 Hypo-osmolality and hyponatremia: Secondary | ICD-10-CM | POA: Diagnosis not present

## 2022-05-28 DIAGNOSIS — B9562 Methicillin resistant Staphylococcus aureus infection as the cause of diseases classified elsewhere: Secondary | ICD-10-CM | POA: Diagnosis not present

## 2022-05-28 DIAGNOSIS — I5030 Unspecified diastolic (congestive) heart failure: Secondary | ICD-10-CM | POA: Diagnosis not present

## 2022-05-28 DIAGNOSIS — I482 Chronic atrial fibrillation, unspecified: Secondary | ICD-10-CM | POA: Diagnosis not present

## 2022-05-28 DIAGNOSIS — E785 Hyperlipidemia, unspecified: Secondary | ICD-10-CM | POA: Diagnosis not present

## 2022-05-28 DIAGNOSIS — N611 Abscess of the breast and nipple: Secondary | ICD-10-CM | POA: Diagnosis not present

## 2022-05-28 DIAGNOSIS — L03313 Cellulitis of chest wall: Secondary | ICD-10-CM | POA: Diagnosis not present

## 2022-05-28 DIAGNOSIS — I5022 Chronic systolic (congestive) heart failure: Secondary | ICD-10-CM | POA: Diagnosis not present

## 2022-05-28 DIAGNOSIS — T8149XA Infection following a procedure, other surgical site, initial encounter: Secondary | ICD-10-CM | POA: Diagnosis not present

## 2022-05-31 DIAGNOSIS — B9562 Methicillin resistant Staphylococcus aureus infection as the cause of diseases classified elsewhere: Secondary | ICD-10-CM | POA: Diagnosis not present

## 2022-05-31 DIAGNOSIS — C4352 Malignant melanoma of skin of breast: Secondary | ICD-10-CM | POA: Diagnosis not present

## 2022-05-31 DIAGNOSIS — B9689 Other specified bacterial agents as the cause of diseases classified elsewhere: Secondary | ICD-10-CM | POA: Diagnosis not present

## 2022-05-31 DIAGNOSIS — T8149XA Infection following a procedure, other surgical site, initial encounter: Secondary | ICD-10-CM | POA: Diagnosis not present

## 2022-05-31 DIAGNOSIS — L03313 Cellulitis of chest wall: Secondary | ICD-10-CM | POA: Diagnosis not present

## 2022-05-31 DIAGNOSIS — N611 Abscess of the breast and nipple: Secondary | ICD-10-CM | POA: Diagnosis not present

## 2022-06-04 DIAGNOSIS — N611 Abscess of the breast and nipple: Secondary | ICD-10-CM | POA: Diagnosis not present

## 2022-06-04 DIAGNOSIS — T8149XA Infection following a procedure, other surgical site, initial encounter: Secondary | ICD-10-CM | POA: Diagnosis not present

## 2022-06-04 DIAGNOSIS — L03313 Cellulitis of chest wall: Secondary | ICD-10-CM | POA: Diagnosis not present

## 2022-06-04 DIAGNOSIS — B9689 Other specified bacterial agents as the cause of diseases classified elsewhere: Secondary | ICD-10-CM | POA: Diagnosis not present

## 2022-06-04 DIAGNOSIS — B9562 Methicillin resistant Staphylococcus aureus infection as the cause of diseases classified elsewhere: Secondary | ICD-10-CM | POA: Diagnosis not present

## 2022-06-04 DIAGNOSIS — C4352 Malignant melanoma of skin of breast: Secondary | ICD-10-CM | POA: Diagnosis not present

## 2022-06-06 ENCOUNTER — Encounter: Payer: Self-pay | Admitting: Infectious Diseases

## 2022-06-06 ENCOUNTER — Ambulatory Visit: Payer: Medicare Other | Attending: Infectious Diseases | Admitting: Infectious Diseases

## 2022-06-06 VITALS — BP 150/85 | HR 52 | Temp 97.1°F | Wt 169.0 lb

## 2022-06-06 DIAGNOSIS — Z85820 Personal history of malignant melanoma of skin: Secondary | ICD-10-CM | POA: Insufficient documentation

## 2022-06-06 DIAGNOSIS — T8141XD Infection following a procedure, superficial incisional surgical site, subsequent encounter: Secondary | ICD-10-CM | POA: Insufficient documentation

## 2022-06-06 DIAGNOSIS — Z79899 Other long term (current) drug therapy: Secondary | ICD-10-CM | POA: Diagnosis not present

## 2022-06-06 DIAGNOSIS — Z853 Personal history of malignant neoplasm of breast: Secondary | ICD-10-CM | POA: Insufficient documentation

## 2022-06-06 DIAGNOSIS — A4901 Methicillin susceptible Staphylococcus aureus infection, unspecified site: Secondary | ICD-10-CM

## 2022-06-06 DIAGNOSIS — Z8619 Personal history of other infectious and parasitic diseases: Secondary | ICD-10-CM | POA: Insufficient documentation

## 2022-06-06 DIAGNOSIS — I4891 Unspecified atrial fibrillation: Secondary | ICD-10-CM | POA: Diagnosis not present

## 2022-06-06 DIAGNOSIS — Z7901 Long term (current) use of anticoagulants: Secondary | ICD-10-CM | POA: Diagnosis not present

## 2022-06-06 DIAGNOSIS — N611 Abscess of the breast and nipple: Secondary | ICD-10-CM

## 2022-06-06 DIAGNOSIS — E785 Hyperlipidemia, unspecified: Secondary | ICD-10-CM | POA: Diagnosis not present

## 2022-06-06 DIAGNOSIS — Z9221 Personal history of antineoplastic chemotherapy: Secondary | ICD-10-CM | POA: Insufficient documentation

## 2022-06-06 DIAGNOSIS — Z792 Long term (current) use of antibiotics: Secondary | ICD-10-CM | POA: Diagnosis not present

## 2022-06-06 DIAGNOSIS — L0291 Cutaneous abscess, unspecified: Secondary | ICD-10-CM

## 2022-06-06 NOTE — Progress Notes (Signed)
NAME: Carol [name]  DOB: Jun 29, 1941  MRN: [id number]  Date/Time: 06/06/2022 8:54 AM  Subjective:  - here with her husband Follow up visit For chest abscess due to MSSA Doing well Wound much smaller,but husband worried that there is white inside Is going to see Dr.rodenberg next Tuesday Following taken from note last visit Carol [name] is a 81 y.o. with a history of Right breast carcinoma status postlumpectomy and chemo in the past, lymphoma low-grade B cell, A-fib status post ablation and multiple cardioversions on Coumadin, melanoma excision from the skin on the right breast on 02/28/2022 was admitted initially to [facility] between 5 1 until 03/12/2022 for cellulitis without abscess formation superficial culture with Staph aureus then she was treated with initially with Vanco and cefepime and discharged on doxycycline.  She then saw her dermatologist who put her on Augmentin.  As it continued to get worse she was readmitted to [facility] between 04/23/2022 until 04/27/2022 for a nodular inflammatory lesion on the right breast area.      This was diagnosed to be Staph aureus.  She underwent debridement and excision of the inflammatory tissue on 04/25/2022.Marland Kitchen     Pathology did not show any malignancy.  Culture was Staph aureus.  After getting IV antibiotics she was discharged on dicloxacillin 1 g every 6 for 2 weeks which she completed  Past Medical History:  Diagnosis Date   A-fib ([place])    Chronic sinusitis    Fibrocystic breast disease    History of pneumonia 1999   Hyperlipidemia    Irritable bowel syndrome    Lichen planus    lymphoma August 2013   Low grade B cell   Mild tricuspid insufficiency Jan 2012   ECHO, [place]   Moderate mitral insufficiency JAN 2012   ECHO, [place]    Past Surgical History:  Procedure Laterality Date   ABDOMINAL HYSTERECTOMY     precancerous cervix,     ABLATION OF DYSRHYTHMIC FOCUS  August 2013   [facility], Dr. Marcello Moores   BREAST SURGERY      bilateral, benign biopsies   CARDIOVERSION N/A 11/23/2020   Procedure: CARDIOVERSION;  Surgeon: Corey Skains, MD;  Location: [facility] ORS;  Service: Cardiovascular;  Laterality: N/A;   CARDIOVERSION N/A 01/17/2021   Procedure: CARDIOVERSION;  Surgeon: Corey Skains, MD;  Location: [facility] OR[facility];  Service: Cardiovascular;  Laterality: N/A;   CARDIOVERSION N/A 01/31/2021   Procedure: CARDIOVERSION;  Surgeon: Corey Skains, MD;  Location: [place] [facility]S;  Service: Cardiovascular;  Laterality: N/A;   CARDIOVERSION N/A 10/09/2021   Procedure: CARDIOVERSION;  Surgeon: Corey Skains, MD;  Location: [place] [facility]S;  Service: Cardiovascular;  Laterality: N/A;   CARDIOVERSION N/A 01/17/2022   Procedure: CARDIOVERSION;  Surgeon: Corey Skains, MD;  Location: [facility] ORS;  Service: Cardiovascular;  Laterality: N/A;   INCISION AND DRAINAGE ABSCESS Right 04/25/2022   Procedure: INCISION AND DRAINAGE ABSCESS;  Surgeon: Ronny Bacon, MD;  Location: [facility] ORS;  Service: General;  Laterality: Right;   MAXILLARY SINUS LIFT  1990's   [name]   oophorectomy     squamous cell removal Right    right leg calf area.   TEE WITHOUT CARDIOVERSION N/A 01/31/2021   Procedure: TRANSESOPHAGEAL ECHOCARDIOGRAM (TEE);  Surgeon: Corey Skains, MD;  Location: [facility] ORS;  Service: Cardiovascular;  Laterality: N/A;   TEE WITHOUT CARDIOVERSION N/A 10/09/2021   Procedure: TRANSESOPHAGEAL ECHOCARDIOGRAM (TEE);  Surgeon: Corey Skains, MD;  Location: [facility] ORS;  Service: Cardiovascular;  Laterality: N/A;  Social History   Socioeconomic History   Marital status: Married    Spouse name: [name]    Number of children: 2   Years of education: Not on file   Highest education level: Not on file  Occupational History   Not on file  Tobacco Use   Smoking status: Never   Smokeless tobacco: Never  Vaping Use   Vaping Use: Not on file  Substance and Sexual Activity   Alcohol use: Yes    Alcohol/week: 4.0 standard drinks of  alcohol    Types: 4 Glasses of wine per week    Comment: Occ.   Drug use: No   Sexual activity: Not Currently    Birth control/protection: Post-menopausal  Other Topics Concern   Not on file  Social History Narrative   Lives at home with spouse    Social Determinants of Health   Financial Resource Strain: Low Risk  (09/20/2021)   Overall Financial Resource Strain (CARDIA)    Difficulty of Paying Living Expenses: Not hard at all  Food Insecurity: No Food Insecurity (09/20/2021)   Hunger Vital Sign    Worried About Running Out of Food in the Last Year: Never true    Ran Out of Food in the Last Year: Never true  Transportation Needs: No Transportation Needs (09/20/2021)   PRAPARE - Hydrologist (Medical): No    Lack of Transportation (Non-Medical): No  Physical Activity: Insufficiently Active (09/20/2021)   Exercise Vital Sign    Days of Exercise per Week: 5 days    Minutes of Exercise per Session: 20 min  Stress: No Stress Concern Present (09/20/2021)   [place]    Feeling of Stress : Not at all  Social Connections: Unknown (09/20/2021)   Social Connection and Isolation Panel [NHANES]    Frequency of Communication with Friends and Family: More than three times a week    Frequency of Social Gatherings with Friends and Family: More than three times a week    Attends Religious Services: Not on file    Active Member of [place] or Organizations: Not on file    Attends Archivist Meetings: Not on file    Marital Status: Married  Intimate Partner Violence: Not At Risk (09/20/2021)   Humiliation, Afraid, Rape, and Kick questionnaire    Fear of Current or Ex-Partner: No    Emotionally Abused: No    Physically Abused: No    Sexually Abused: No    Family History  Problem Relation Age of Onset   Cancer Mother        Breast   Hyperlipidemia Father    Heart disease Father     Hypertension Father    Kidney disease Father    Cancer Maternal Grandfather        colon CA   Diverticulitis Sister    Allergies  Allergen Reactions   Prednisone Palpitations    A. fib   Pulmicort [Budesonide] Shortness Of Breath, Itching, Swelling and Other (See Comments)    Felt as if things were crawly on her   Clonidine Other (See Comments)    Pt felt like things were crawling on her arms.   Lotemax [Loteprednol Etabonate]     Broke out around the eye   Morphine And Related Itching and Other (See Comments)    "creepy crawly feeling"   Trovan [Alatrofloxacin Mesylate] Other (See Comments)    "effected the whole system"  Zelnorm [Tegaserod Maleate] Itching    Felt as if things were crawly on her    Ancef [Cefazolin] Rash   Erythromycin Rash   I? Current Outpatient Medications  Medication Sig Dispense Refill   acetaminophen (TYLENOL) 500 MG tablet Take 1,000 mg by mouth every 6 (six) hours as needed for moderate pain or headache.     amiodarone (PACERONE) 200 MG tablet Take 200 mg by mouth daily.     b complex vitamins capsule Take 1 capsule by mouth daily.     bisacodyl (DULCOLAX) 5 MG EC tablet Take 5 mg by mouth daily as needed for moderate constipation.     Cholecalciferol (VITAMIN D) 50 MCG (2000 UT) tablet Take 2,000 Units by mouth daily.     diltiazem (CARDIZEM CD) 180 MG 24 hr capsule Take 180 mg by mouth daily.     diltiazem (DILACOR XR) 180 MG 24 hr capsule Take 180 mg by mouth daily.     diphenhydrAMINE (BENADRYL) 25 mg capsule Take 25 mg by mouth every 6 (six) hours as needed for allergies.     docusate sodium (COLACE) 100 MG capsule Take 100 mg by mouth daily as needed for mild constipation.     furosemide (LASIX) 40 MG tablet Take 1 tablet (40 mg total) by mouth daily. More if directed by physician 90 tablet 1   gabapentin (NEURONTIN) 100 MG capsule Take 1-2 capsules (100-200 mg total) by mouth See admin instructions. Patient taking 100 mg in the morning and 200  mg at night.  Home med. 90 capsule 1   Glucosamine-Chondroit-Vit C-Mn (GLUCOSAMINE 1500 COMPLEX PO) Take 1 tablet by mouth 2 (two) times daily.     Glycerin-Polysorbate 80 (REFRESH DRY EYE THERAPY OP) Place 1 drop into both eyes 2 (two) times daily as needed (dry eyes).     MAGNESIUM CITRATE PO Take 400 mg by mouth 2 (two) times daily.     Melatonin 5 MG CAPS Take 5 mg by mouth at bedtime as needed (sleep).     metoprolol succinate (TOPROL-XL) 50 MG 24 hr tablet Take 50-100 mg by mouth See admin instructions. Take 100 mg in the morning and 50 mg at night     Multiple Vitamin (MULTIVITAMIN WITH MINERALS) TABS tablet Take 0.5 tablets by mouth 2 (two) times daily.     Omega-3 Fatty Acids (FISH OIL) 1000 MG CAPS Take 2,000 mg by mouth in the morning, at noon, and at bedtime.     omeprazole (PRILOSEC) 20 MG capsule TAKE 1 CAPSULE DAILY 90 capsule 3   rosuvastatin (CRESTOR) 5 MG tablet Take 5 mg by mouth 2 (two) times a week. Mondays and Thursdays     vitamin E 1000 UNIT capsule Take 1,000 Units by mouth 2 (two) times daily.     warfarin (COUMADIN) 4 MG tablet Take 4 mg by mouth See admin instructions. Take 4 mg daily on Monday, Wednesday and Friday     warfarin (COUMADIN) 5 MG tablet Take 5 mg by mouth 2 (two) times a week. Take 5 mg by mouth daily on Sunday, Tuesday, Thursday and Saturday     No current facility-administered medications for this visit.     Abtx:  Anti-infectives (From admission, onward)    None       REVIEW OF SYSTEMS:  Const: negative fever, negative chills, negative weight loss Eyes: negative diplopia or visual changes, negative eye pain ENT: negative coryza, negative sore throat Resp: negative cough, hemoptysis, dyspnea Cards: negative for chest pain,  palpitations, lower extremity edema GU: negative for frequency, dysuria and hematuria GI: Negative for abdominal pain, diarrhea, bleeding, constipation Skin: negative for rash and pruritus Heme: negative for easy  bruising and gum/nose bleeding MS: negative for myalgias, arthralgias, back pain and muscle weakness Neurolo:negative for headaches, dizziness, vertigo, memory problems  Psych: negative for feelings of anxiety, depression  Endocrine: negative for thyroid, diabetes Allergy/Immunology-as above  Objective:  VITALS:  BP (!) 150/85   Pulse (!) 52   Temp (!) 97.1 F (36.2 C) (Temporal)   Wt 169 lb (76.7 kg)   BMI 26.87 kg/m   PHYSICAL EXAM:  General: Alert, cooperative, no distress, appears stated age.  Lungs: Clear to auscultation bilaterally. No Wheezing or Rhonchi. No rales. Heart: Regular rate and rhythm, no murmur, rub or gallop. Abdomen: Sdid not examine Extremities: atraumatic, no cyanosis. No edema. No clubbing Skin: rt breast- surgical wound very small with pin hole track inside      Lymph: Cervical, supraclavicular normal. Neurologic: Grossly non-focal Pertinent Labs none  ? Impression/Recommendation ? MSSA abscess/phlegmon on the right breast below the site of a previous melanoma excision.  Status post incision and drainage and debridement done on 04/25/2022.  Completed diclox for > 2 weeks- no antibiotic needed now Surgical wound healing as expected- but has a key hole track  which I packed with the wick cut across Pt has a follow up appt with Dr.Rodenberg next week Will follow her PRN Discussed the management with the patient and her husband Note:  This document was prepared using Dragon voice recognition software and may include unintentional dictation errors.

## 2022-06-06 NOTE — Patient Instructions (Addendum)
You are here for follow up of the rt breast abscess and surgical wound- it is healing good and now with a small pin hole- track which may be difficult to pack with 1/4 " Follow up with Dr.Rodenberg. follow up with me PRN

## 2022-06-07 ENCOUNTER — Other Ambulatory Visit (INDEPENDENT_AMBULATORY_CARE_PROVIDER_SITE_OTHER): Payer: Medicare Other

## 2022-06-07 DIAGNOSIS — E782 Mixed hyperlipidemia: Secondary | ICD-10-CM | POA: Diagnosis not present

## 2022-06-07 DIAGNOSIS — R944 Abnormal results of kidney function studies: Secondary | ICD-10-CM | POA: Diagnosis not present

## 2022-06-07 LAB — BASIC METABOLIC PANEL
BUN: 21 mg/dL (ref 6–23)
CO2: 29 mEq/L (ref 19–32)
Calcium: 8.7 mg/dL (ref 8.4–10.5)
Chloride: 99 mEq/L (ref 96–112)
Creatinine, Ser: 1.03 mg/dL (ref 0.40–1.20)
GFR: 50.91 mL/min — ABNORMAL LOW (ref >60.00)
Glucose, Bld: 95 mg/dL (ref 70–99)
Potassium: 4.3 mEq/L (ref 3.5–5.1)
Sodium: 133 mEq/L — ABNORMAL LOW (ref 135–145)

## 2022-06-07 LAB — LIPID PANEL
Cholesterol: 203 mg/dL — ABNORMAL HIGH (ref 0–200)
HDL: 59.2 mg/dL (ref >39.00)
LDL Cholesterol: 123 mg/dL — ABNORMAL HIGH (ref 0–99)
NonHDL: 143.9
Total CHOL/HDL Ratio: 3
Triglycerides: 106 mg/dL (ref 0.0–149.0)
VLDL: 21.2 mg/dL (ref 0.0–40.0)

## 2022-06-09 NOTE — Addendum Note (Signed)
Addended by: Crecencio Mc on: 06/09/2022 02:21 PM   Modules accepted: Orders

## 2022-06-11 ENCOUNTER — Telehealth (INDEPENDENT_AMBULATORY_CARE_PROVIDER_SITE_OTHER): Payer: Medicare Other | Admitting: Internal Medicine

## 2022-06-11 ENCOUNTER — Encounter: Payer: Self-pay | Admitting: Surgery

## 2022-06-11 ENCOUNTER — Ambulatory Visit (INDEPENDENT_AMBULATORY_CARE_PROVIDER_SITE_OTHER): Payer: Medicare Other | Admitting: Surgery

## 2022-06-11 ENCOUNTER — Encounter: Payer: Self-pay | Admitting: Internal Medicine

## 2022-06-11 VITALS — BP 140/85 | HR 64 | Temp 97.8°F | Wt 172.4 lb

## 2022-06-11 DIAGNOSIS — B9689 Other specified bacterial agents as the cause of diseases classified elsewhere: Secondary | ICD-10-CM | POA: Diagnosis not present

## 2022-06-11 DIAGNOSIS — L03313 Cellulitis of chest wall: Secondary | ICD-10-CM | POA: Diagnosis not present

## 2022-06-11 DIAGNOSIS — B9562 Methicillin resistant Staphylococcus aureus infection as the cause of diseases classified elsewhere: Secondary | ICD-10-CM | POA: Diagnosis not present

## 2022-06-11 DIAGNOSIS — J0120 Acute ethmoidal sinusitis, unspecified: Secondary | ICD-10-CM | POA: Diagnosis not present

## 2022-06-11 DIAGNOSIS — Z7901 Long term (current) use of anticoagulants: Secondary | ICD-10-CM | POA: Diagnosis not present

## 2022-06-11 DIAGNOSIS — S21001D Unspecified open wound of right breast, subsequent encounter: Secondary | ICD-10-CM | POA: Diagnosis not present

## 2022-06-11 DIAGNOSIS — Z8616 Personal history of COVID-19: Secondary | ICD-10-CM | POA: Diagnosis not present

## 2022-06-11 DIAGNOSIS — E785 Hyperlipidemia, unspecified: Secondary | ICD-10-CM | POA: Diagnosis not present

## 2022-06-11 DIAGNOSIS — I5030 Unspecified diastolic (congestive) heart failure: Secondary | ICD-10-CM | POA: Diagnosis not present

## 2022-06-11 DIAGNOSIS — E871 Hypo-osmolality and hyponatremia: Secondary | ICD-10-CM | POA: Diagnosis not present

## 2022-06-11 DIAGNOSIS — I5022 Chronic systolic (congestive) heart failure: Secondary | ICD-10-CM | POA: Diagnosis not present

## 2022-06-11 DIAGNOSIS — C8300 Small cell B-cell lymphoma, unspecified site: Secondary | ICD-10-CM | POA: Diagnosis not present

## 2022-06-11 DIAGNOSIS — I482 Chronic atrial fibrillation, unspecified: Secondary | ICD-10-CM | POA: Diagnosis not present

## 2022-06-11 DIAGNOSIS — C4352 Malignant melanoma of skin of breast: Secondary | ICD-10-CM | POA: Diagnosis not present

## 2022-06-11 DIAGNOSIS — T8149XA Infection following a procedure, other surgical site, initial encounter: Secondary | ICD-10-CM | POA: Diagnosis not present

## 2022-06-11 DIAGNOSIS — N611 Abscess of the breast and nipple: Secondary | ICD-10-CM | POA: Diagnosis not present

## 2022-06-11 MED ORDER — HYDROGEL AG EX GEL: CUTANEOUS | 0 refills | Status: DC

## 2022-06-11 NOTE — Progress Notes (Signed)
Virtual Visit via [place]   Note    This format is felt to be most appropriate for this patient at this time.  All issues noted in this document were discussed and addressed.  No physical exam was performed (except for noted visual exam findings with Video Visits).   I connected with Ms Biller  on 06/11/22 at  4:30 PM EDT by a video enabled telemedicine application  and verified that I am speaking with the correct person using two identifiers. Location patient: home Location provider: work or home office Persons participating in the virtual visit: patient, provider  I discussed the limitations, risks, security and privacy concerns of performing an evaluation and management service by telephone and the availability of in person appointments. I also discussed with the patient that there may be a patient responsible charge related to this service. The patient expressed understanding and agreed to proceed.  Reason for visit: sinus pain and congestion x 2 weeks  HPI:  81 yr old female recently treated for breast abscess, initially with augmentin in late June /early july,  followed by change to dicloxacillin on  July 6 for culture proven MRSA, (finalized June 27)  finished on July 18  presents with 2 week history of sore throat, tenacious post nasal drip, and sinus  congestion .  She has a history of recurrent sinus infections that have been treated in the past to resolution with augmentin.  .    ROS: See pertinent positives and negatives per HPI.  Past Medical History:  Diagnosis Date   A-fib [facility])    Chronic sinusitis    Fibrocystic breast disease    History of pneumonia 1999   Hyperlipidemia    Irritable bowel syndrome    Lichen planus    lymphoma August 2013   Low grade B cell   Mild tricuspid insufficiency Jan 2012   [facility], [place]   Moderate mitral insufficiency JAN 2012   [facility], [place]    Past Surgical History:  Procedure Laterality Date   ABDOMINAL HYSTERECTOMY      precancerous cervix,     ABLATION OF DYSRHYTHMIC FOCUS  August 2013   [facility], Dr. Marcello Moores   BREAST SURGERY     bilateral, benign biopsies   CARDIOVERSION N/A 11/23/2020   Procedure: CARDIOVERSION;  Surgeon: Corey Skains, MD;  Location: [facility] ORS;  Service: Cardiovascular;  Laterality: N/A;   CARDIOVERSION N/A 01/17/2021   Procedure: CARDIOVERSION;  Surgeon: Corey Skains, MD;  Location: [facility] OR[facility];  Service: Cardiovascular;  Laterality: N/A;   CARDIOVERSION N/A 01/31/2021   Procedure: CARDIOVERSION;  Surgeon: Corey Skains, MD;  Location: [place] [facility];  Service: Cardiovascular;  Laterality: N/A;   CARDIOVERSION N/A 10/09/2021   Procedure: CARDIOVERSION;  Surgeon: Corey Skains, MD;  Location: [place] [facility];  Service: Cardiovascular;  Laterality: N/A;   CARDIOVERSION N/A 01/17/2022   Procedure: CARDIOVERSION;  Surgeon: Corey Skains, MD;  Location: [facility];  Service: Cardiovascular;  Laterality: N/A;   INCISION AND DRAINAGE ABSCESS Right 04/25/2022   Procedure: INCISION AND DRAINAGE ABSCESS;  Surgeon: Ronny Bacon, MD;  Location: [facility];  Service: General;  Laterality: Right;   MAXILLARY SINUS LIFT  1990's   [name]   oophorectomy     squamous cell removal Right    right leg calf area.   TEE WITHOUT CARDIOVERSION N/A 01/31/2021   Procedure: TRANSESOPHAGEAL ECHOCARDIOGRAM (TEE);  Surgeon: Corey Skains, MD;  Location: [facility];  Service: Cardiovascular;  Laterality: N/A;   TEE WITHOUT  CARDIOVERSION N/A 10/09/2021   Procedure: TRANSESOPHAGEAL ECHOCARDIOGRAM (TEE);  Surgeon: Corey Skains, MD;  Location: [facility];  Service: Cardiovascular;  Laterality: N/A;    Family History  Problem Relation Age of Onset   Cancer Mother        Breast   Hyperlipidemia Father    Heart disease Father    Hypertension Father    Kidney disease Father    Cancer Maternal Grandfather        colon CA   Diverticulitis Sister     SOCIAL HX:  reports that she has never smoked. She has never  used smokeless tobacco. She reports current alcohol use of about 4.0 standard drinks of alcohol per week. She reports that she does not use drugs.    Current Outpatient Medications:    acetaminophen (TYLENOL) 500 MG tablet, Take 1,000 mg by mouth every 6 (six) hours as needed for moderate pain or headache., Disp: , Rfl:    amiodarone (PACERONE) 200 MG tablet, Take 200 mg by mouth daily., Disp: , Rfl:    b complex vitamins capsule, Take 1 capsule by mouth daily., Disp: , Rfl:    bisacodyl (DULCOLAX) 5 MG EC tablet, Take 5 mg by mouth daily as needed for moderate constipation., Disp: , Rfl:    Cholecalciferol (VITAMIN D) 50 MCG (2000 UT) tablet, Take 2,000 Units by mouth daily., Disp: , Rfl:    diltiazem (CARDIZEM CD) 180 MG 24 hr capsule, Take 180 mg by mouth daily., Disp: , Rfl:    diphenhydrAMINE (BENADRYL) 25 mg capsule, Take 25 mg by mouth every 6 (six) hours as needed for allergies., Disp: , Rfl:    docusate sodium (COLACE) 100 MG capsule, Take 100 mg by mouth daily as needed for mild constipation., Disp: , Rfl:    furosemide (LASIX) 40 MG tablet, Take 1 tablet (40 mg total) by mouth daily. More if directed by physician, Disp: 90 tablet, Rfl: 1   gabapentin (NEURONTIN) 100 MG capsule, Take 1-2 capsules (100-200 mg total) by mouth See admin instructions. Patient taking 100 mg in the morning and 200 mg at night.  Home med., Disp: 90 capsule, Rfl: 1   Glucosamine-Chondroit-Vit C-Mn (GLUCOSAMINE 1500 COMPLEX PO), Take 1 tablet by mouth 2 (two) times daily., Disp: , Rfl:    Glycerin-Polysorbate 80 (REFRESH DRY EYE THERAPY OP), Place 1 drop into both eyes 2 (two) times daily as needed (dry eyes)., Disp: , Rfl:    MAGNESIUM CITRATE PO, Take 400 mg by mouth 2 (two) times daily., Disp: , Rfl:    Melatonin 5 MG CAPS, Take 5 mg by mouth at bedtime as needed (sleep)., Disp: , Rfl:    metoprolol succinate (TOPROL-XL) 50 MG 24 hr tablet, Take 50-100 mg by mouth See admin instructions. Take 100 mg in the  morning and 50 mg at night, Disp: , Rfl:    Multiple Vitamin (MULTIVITAMIN WITH MINERALS) TABS tablet, Take 0.5 tablets by mouth 2 (two) times daily., Disp: , Rfl:    Omega-3 Fatty Acids (FISH OIL) 1000 MG CAPS, Take 2,000 mg by mouth in the morning, at noon, and at bedtime., Disp: , Rfl:    omeprazole (PRILOSEC) 20 MG capsule, TAKE 1 CAPSULE DAILY, Disp: 90 capsule, Rfl: 3   rosuvastatin (CRESTOR) 5 MG tablet, Take 5 mg by mouth 2 (two) times a week. Mondays and Thursdays, Disp: , Rfl:    Silver (HYDROGEL AG) GEL, Apply daily to open wound. Cleanse with Q-tip., Disp: 15 mL, Rfl: 0  vitamin E 1000 UNIT capsule, Take 1,000 Units by mouth 2 (two) times daily., Disp: , Rfl:    warfarin (COUMADIN) 4 MG tablet, Take 4 mg by mouth See admin instructions. Take 4 mg daily on Monday, Wednesday and Friday, Disp: , Rfl:    warfarin (COUMADIN) 5 MG tablet, Take 5 mg by mouth 2 (two) times a week. Take 5 mg by mouth daily on Sunday, Tuesday, Thursday and Saturday, Disp: , Rfl:   EXAM:  VITALS per patient if applicable:  GENERAL: alert, oriented, appears well and in no acute distress  HEENT: atraumatic, conjunttiva clear, no obvious abnormalities on inspection of external nose and ears  NECK: normal movements of the head and neck  LUNGS: on inspection no signs of respiratory distress, breathing rate appears normal, no obvious gross SOB, gasping or wheezing  CV: no obvious cyanosis  MS: moves all visible extremities without noticeable abnormality  PSYCH/NEURO: pleasant and cooperative, no obvious depression or anxiety, speech and thought processing grossly intact  ASSESSMENT AND PLAN:  Discussed the following assessment and plan:  Acute non-recurrent ethmoidal sinusitis  Acute non-recurrent ethmoidal sinusitis Sinus drainage and congestion with thick  purulent sinus drainage and sore throat for the past 14 days    purulent sputum and headaches for the last 14 days.  Patient  has used multiple  OTD decongestants,  Sinus rinses,  advil and tylenol along with otc cough suppressants without improvement. Advised to resume augmentin bid x 14 days.  She is on lifelong probiotic      I discussed the assessment and treatment plan with the patient. The patient was provided an opportunity to ask questions and all were answered. The patient agreed with the plan and demonstrated an understanding of the instructions.   The patient was advised to call back or seek an in-person evaluation if the symptoms worsen or if the condition fails to improve as anticipated.   I spent 20 minutes dedicated to the care of this patient on the date of this encounter to include pre-visit review of his medical history,  Face-to-face time with the patient , and post visit ordering of testing and therapeutics.    Crecencio Mc, MD

## 2022-06-11 NOTE — Progress Notes (Signed)
Ms. Newland returns today, having some recent concerns raised about some white tissue and tracking and tunneling of her current wound. Her husband asked if there was 1/8 inch wide packing strip as the wound is become so small as it is difficult to pack at all.  There is no remarkable pain, tenderness, malodor or significant drainage. On my examination today it will accommodate a cotton tip applicator head but nothing else.  I downsized to a silver nitrate stick which did not go any deeper or penetrate any further. This leaves me with a fairly shallow sinus tract that appears to be really well granulated. In the lieu of the availability of hydrogel in the office, I utilized a saline corner of a 2 x 2 to redress her wound with.  I advised we's transition to hydrogel to maintain/prevent desiccation of the wound.  Cleanse the wound mechanically with a Q-tip on a daily basis.  We will follow-up in a month. Our face-to-face time was 20 minutes.

## 2022-06-11 NOTE — Assessment & Plan Note (Signed)
Sinus drainage and congestion with thick  purulent sinus drainage and sore throat for the past 14 days    purulent sputum and headaches for the last 14 days.  Patient  has used multiple OTD decongestants,  Sinus rinses,  advil and tylenol along with otc cough suppressants without improvement. Advised to resume augmentin bid x 14 days.  She is on lifelong probiotic

## 2022-06-11 NOTE — Patient Instructions (Addendum)
If you have any concerns or questions, please feel free to call our office. See follow up appointment below.   Incision and Drainage, Care After This sheet gives you information about how to care for yourself after your procedure. Your health care provider may also give you more specific instructions. If you have problems or questions, contact your health care provider. What can I expect after the procedure? After the procedure, it is common to have: Pain or discomfort around the incision site. Blood, fluid, or pus (drainage) from the incision. Redness and firm skin around the incision site. Follow these instructions at home: Medicines Take over-the-counter and prescription medicines only as told by your health care provider. If you were prescribed an antibiotic medicine, use or take it as told by your health care provider. Do not stop using the antibiotic even if you start to feel better. Wound care Follow instructions from your health care provider about how to take care of your wound. Make sure you: Wash your hands with soap and water before and after you change your bandage (dressing). If soap and water are not available, use hand sanitizer. Change your dressing and packing as told by your health care provider. If your dressing is dry or stuck when you try to remove it, moisten or wet the dressing with saline or water so that it can be removed without harming your skin or tissues. If your wound is packed, leave it in place until your health care provider tells you to remove it. To remove the packing, moisten or wet the packing with saline or water so that it can be removed without harming your skin or tissues. Leave stitches (sutures), skin glue, or adhesive strips in place. These skin closures may need to stay in place for 2 weeks or longer. If adhesive strip edges start to loosen and curl up, you may trim the loose edges. Do not remove adhesive strips completely unless your health care  provider tells you to do that. Check your wound every day for signs of infection. Check for: More redness, swelling, or pain. More fluid or blood. Warmth. Pus or a bad smell. If you were sent home with a drain tube in place, follow instructions from your health care provider about: How to empty it. How to care for it at home.  General instructions Rest the affected area. Do not take baths, swim, or use a hot tub until your health care provider approves. Ask your health care provider if you may take showers. You may only be allowed to take sponge baths. Return to your normal activities as told by your health care provider. Ask your health care provider what activities are safe for you. Your health care provider may put you on activity or lifting restrictions. The incision will continue to drain. It is normal to have some clear or slightly bloody drainage. The amount of drainage should lessen each day. Do not apply any creams, ointments, or liquids unless you have been told to by your health care provider. Keep all follow-up visits as told by your health care provider. This is important. Contact a health care provider if: Your cyst or abscess returns. You have more redness, swelling, or pain around your incision. You have more fluid or blood coming from your incision. Your incision feels warm to the touch. You have pus or a bad smell coming from your incision. You have red streaks above or below the incision site. Get help right away if: You have severe  pain or bleeding. You cannot eat or drink without vomiting. You have a fever or chills. You have redness that spreads quickly. You have decreased urine output. You become short of breath. You have chest pain. You cough up blood. The affected area becomes numb or starts to tingle. These symptoms may represent a serious problem that is an emergency. Do not wait to see if the symptoms will go away. Get medical help right away. Call your  local emergency services (911 in the [place] Do not drive yourself to the hospital. Summary After this procedure, it is common to have fluid, blood, or pus coming from the surgery site. Follow all home care instructions. You will be told how to take care of your incision, how to check for infection, and how to take medicines. If you were prescribed an antibiotic medicine, take it as told by your health care provider. Do not stop taking the antibiotic even if you start to feel better. Contact a health care provider if you have increased redness, swelling, or pain around your incision. Get help right away if you have chest pain, you vomit, you cough up blood, or you have shortness of breath. Keep all follow-up visits as told by your health care provider. This is important. This information is not intended to replace advice given to you by your health care provider. Make sure you discuss any questions you have with your health care provider. Document Revised: 01/24/2022 Document Reviewed: 08/02/2021 Elsevier Patient Education  [place].

## 2022-06-12 DIAGNOSIS — C4352 Malignant melanoma of skin of breast: Secondary | ICD-10-CM | POA: Diagnosis not present

## 2022-06-12 DIAGNOSIS — N611 Abscess of the breast and nipple: Secondary | ICD-10-CM | POA: Diagnosis not present

## 2022-06-12 DIAGNOSIS — L03313 Cellulitis of chest wall: Secondary | ICD-10-CM | POA: Diagnosis not present

## 2022-06-12 DIAGNOSIS — B9689 Other specified bacterial agents as the cause of diseases classified elsewhere: Secondary | ICD-10-CM | POA: Diagnosis not present

## 2022-06-12 DIAGNOSIS — T8149XA Infection following a procedure, other surgical site, initial encounter: Secondary | ICD-10-CM | POA: Diagnosis not present

## 2022-06-12 DIAGNOSIS — B9562 Methicillin resistant Staphylococcus aureus infection as the cause of diseases classified elsewhere: Secondary | ICD-10-CM | POA: Diagnosis not present

## 2022-06-14 DIAGNOSIS — B9689 Other specified bacterial agents as the cause of diseases classified elsewhere: Secondary | ICD-10-CM | POA: Diagnosis not present

## 2022-06-14 DIAGNOSIS — C4352 Malignant melanoma of skin of breast: Secondary | ICD-10-CM | POA: Diagnosis not present

## 2022-06-14 DIAGNOSIS — L03313 Cellulitis of chest wall: Secondary | ICD-10-CM | POA: Diagnosis not present

## 2022-06-14 DIAGNOSIS — B9562 Methicillin resistant Staphylococcus aureus infection as the cause of diseases classified elsewhere: Secondary | ICD-10-CM | POA: Diagnosis not present

## 2022-06-14 DIAGNOSIS — N611 Abscess of the breast and nipple: Secondary | ICD-10-CM | POA: Diagnosis not present

## 2022-06-14 DIAGNOSIS — T8149XA Infection following a procedure, other surgical site, initial encounter: Secondary | ICD-10-CM | POA: Diagnosis not present

## 2022-06-17 DIAGNOSIS — Z7901 Long term (current) use of anticoagulants: Secondary | ICD-10-CM | POA: Diagnosis not present

## 2022-06-18 DIAGNOSIS — T8149XA Infection following a procedure, other surgical site, initial encounter: Secondary | ICD-10-CM | POA: Diagnosis not present

## 2022-06-18 DIAGNOSIS — N611 Abscess of the breast and nipple: Secondary | ICD-10-CM | POA: Diagnosis not present

## 2022-06-18 DIAGNOSIS — B9689 Other specified bacterial agents as the cause of diseases classified elsewhere: Secondary | ICD-10-CM | POA: Diagnosis not present

## 2022-06-18 DIAGNOSIS — B9562 Methicillin resistant Staphylococcus aureus infection as the cause of diseases classified elsewhere: Secondary | ICD-10-CM | POA: Diagnosis not present

## 2022-06-18 DIAGNOSIS — C4352 Malignant melanoma of skin of breast: Secondary | ICD-10-CM | POA: Diagnosis not present

## 2022-06-18 DIAGNOSIS — L03313 Cellulitis of chest wall: Secondary | ICD-10-CM | POA: Diagnosis not present

## 2022-06-25 ENCOUNTER — Encounter: Payer: Medicare Other | Admitting: Surgery

## 2022-06-25 DIAGNOSIS — L03313 Cellulitis of chest wall: Secondary | ICD-10-CM | POA: Diagnosis not present

## 2022-06-25 DIAGNOSIS — B9689 Other specified bacterial agents as the cause of diseases classified elsewhere: Secondary | ICD-10-CM | POA: Diagnosis not present

## 2022-06-25 DIAGNOSIS — C4352 Malignant melanoma of skin of breast: Secondary | ICD-10-CM | POA: Diagnosis not present

## 2022-06-25 DIAGNOSIS — N611 Abscess of the breast and nipple: Secondary | ICD-10-CM | POA: Diagnosis not present

## 2022-06-25 DIAGNOSIS — B9562 Methicillin resistant Staphylococcus aureus infection as the cause of diseases classified elsewhere: Secondary | ICD-10-CM | POA: Diagnosis not present

## 2022-06-25 DIAGNOSIS — T8149XA Infection following a procedure, other surgical site, initial encounter: Secondary | ICD-10-CM | POA: Diagnosis not present

## 2022-06-28 DIAGNOSIS — B9689 Other specified bacterial agents as the cause of diseases classified elsewhere: Secondary | ICD-10-CM | POA: Diagnosis not present

## 2022-06-28 DIAGNOSIS — L03313 Cellulitis of chest wall: Secondary | ICD-10-CM | POA: Diagnosis not present

## 2022-06-28 DIAGNOSIS — B9562 Methicillin resistant Staphylococcus aureus infection as the cause of diseases classified elsewhere: Secondary | ICD-10-CM | POA: Diagnosis not present

## 2022-06-28 DIAGNOSIS — N611 Abscess of the breast and nipple: Secondary | ICD-10-CM | POA: Diagnosis not present

## 2022-06-28 DIAGNOSIS — C4352 Malignant melanoma of skin of breast: Secondary | ICD-10-CM | POA: Diagnosis not present

## 2022-06-28 DIAGNOSIS — T8149XA Infection following a procedure, other surgical site, initial encounter: Secondary | ICD-10-CM | POA: Diagnosis not present

## 2022-07-01 DIAGNOSIS — H02403 Unspecified ptosis of bilateral eyelids: Secondary | ICD-10-CM | POA: Diagnosis not present

## 2022-07-02 DIAGNOSIS — N611 Abscess of the breast and nipple: Secondary | ICD-10-CM | POA: Diagnosis not present

## 2022-07-02 DIAGNOSIS — L03313 Cellulitis of chest wall: Secondary | ICD-10-CM | POA: Diagnosis not present

## 2022-07-02 DIAGNOSIS — T8149XA Infection following a procedure, other surgical site, initial encounter: Secondary | ICD-10-CM | POA: Diagnosis not present

## 2022-07-02 DIAGNOSIS — C4352 Malignant melanoma of skin of breast: Secondary | ICD-10-CM | POA: Diagnosis not present

## 2022-07-02 DIAGNOSIS — B9689 Other specified bacterial agents as the cause of diseases classified elsewhere: Secondary | ICD-10-CM | POA: Diagnosis not present

## 2022-07-02 DIAGNOSIS — B9562 Methicillin resistant Staphylococcus aureus infection as the cause of diseases classified elsewhere: Secondary | ICD-10-CM | POA: Diagnosis not present

## 2022-07-05 DIAGNOSIS — L03313 Cellulitis of chest wall: Secondary | ICD-10-CM | POA: Diagnosis not present

## 2022-07-05 DIAGNOSIS — C4352 Malignant melanoma of skin of breast: Secondary | ICD-10-CM | POA: Diagnosis not present

## 2022-07-05 DIAGNOSIS — T8149XA Infection following a procedure, other surgical site, initial encounter: Secondary | ICD-10-CM | POA: Diagnosis not present

## 2022-07-05 DIAGNOSIS — B9562 Methicillin resistant Staphylococcus aureus infection as the cause of diseases classified elsewhere: Secondary | ICD-10-CM | POA: Diagnosis not present

## 2022-07-05 DIAGNOSIS — B9689 Other specified bacterial agents as the cause of diseases classified elsewhere: Secondary | ICD-10-CM | POA: Diagnosis not present

## 2022-07-05 DIAGNOSIS — N611 Abscess of the breast and nipple: Secondary | ICD-10-CM | POA: Diagnosis not present

## 2022-07-09 DIAGNOSIS — L03313 Cellulitis of chest wall: Secondary | ICD-10-CM | POA: Diagnosis not present

## 2022-07-09 DIAGNOSIS — C4352 Malignant melanoma of skin of breast: Secondary | ICD-10-CM | POA: Diagnosis not present

## 2022-07-09 DIAGNOSIS — B9562 Methicillin resistant Staphylococcus aureus infection as the cause of diseases classified elsewhere: Secondary | ICD-10-CM | POA: Diagnosis not present

## 2022-07-09 DIAGNOSIS — T8149XA Infection following a procedure, other surgical site, initial encounter: Secondary | ICD-10-CM | POA: Diagnosis not present

## 2022-07-09 DIAGNOSIS — B9689 Other specified bacterial agents as the cause of diseases classified elsewhere: Secondary | ICD-10-CM | POA: Diagnosis not present

## 2022-07-09 DIAGNOSIS — N611 Abscess of the breast and nipple: Secondary | ICD-10-CM | POA: Diagnosis not present

## 2022-07-11 ENCOUNTER — Ambulatory Visit (INDEPENDENT_AMBULATORY_CARE_PROVIDER_SITE_OTHER): Payer: Medicare Other | Admitting: Surgery

## 2022-07-11 ENCOUNTER — Encounter: Payer: Self-pay | Admitting: Surgery

## 2022-07-11 VITALS — BP 142/78 | HR 62 | Temp 97.8°F | Wt 172.2 lb

## 2022-07-11 DIAGNOSIS — I5022 Chronic systolic (congestive) heart failure: Secondary | ICD-10-CM | POA: Diagnosis not present

## 2022-07-11 DIAGNOSIS — Z8616 Personal history of COVID-19: Secondary | ICD-10-CM | POA: Diagnosis not present

## 2022-07-11 DIAGNOSIS — C8300 Small cell B-cell lymphoma, unspecified site: Secondary | ICD-10-CM | POA: Diagnosis not present

## 2022-07-11 DIAGNOSIS — I482 Chronic atrial fibrillation, unspecified: Secondary | ICD-10-CM | POA: Diagnosis not present

## 2022-07-11 DIAGNOSIS — N611 Abscess of the breast and nipple: Secondary | ICD-10-CM | POA: Diagnosis not present

## 2022-07-11 DIAGNOSIS — I5030 Unspecified diastolic (congestive) heart failure: Secondary | ICD-10-CM | POA: Diagnosis not present

## 2022-07-11 DIAGNOSIS — E785 Hyperlipidemia, unspecified: Secondary | ICD-10-CM | POA: Diagnosis not present

## 2022-07-11 DIAGNOSIS — C4352 Malignant melanoma of skin of breast: Secondary | ICD-10-CM | POA: Diagnosis not present

## 2022-07-11 DIAGNOSIS — E871 Hypo-osmolality and hyponatremia: Secondary | ICD-10-CM | POA: Diagnosis not present

## 2022-07-11 DIAGNOSIS — Z7901 Long term (current) use of anticoagulants: Secondary | ICD-10-CM | POA: Diagnosis not present

## 2022-07-11 DIAGNOSIS — S21001D Unspecified open wound of right breast, subsequent encounter: Secondary | ICD-10-CM

## 2022-07-11 NOTE — Patient Instructions (Signed)
If you have any concerns or questions, please feel free to call our office. Follow up as needed.   Incision and Drainage, Care After This sheet gives you information about how to care for yourself after your procedure. Your health care provider may also give you more specific instructions. If you have problems or questions, contact your health care provider. What can I expect after the procedure? After the procedure, it is common to have: Pain or discomfort around the incision site. Blood, fluid, or pus (drainage) from the incision. Redness and firm skin around the incision site. Follow these instructions at home: Medicines Take over-the-counter and prescription medicines only as told by your health care provider. If you were prescribed an antibiotic medicine, use or take it as told by your health care provider. Do not stop using the antibiotic even if you start to feel better. Wound care Follow instructions from your health care provider about how to take care of your wound. Make sure you: Wash your hands with soap and water before and after you change your bandage (dressing). If soap and water are not available, use hand sanitizer. Change your dressing and packing as told by your health care provider. If your dressing is dry or stuck when you try to remove it, moisten or wet the dressing with saline or water so that it can be removed without harming your skin or tissues. If your wound is packed, leave it in place until your health care provider tells you to remove it. To remove the packing, moisten or wet the packing with saline or water so that it can be removed without harming your skin or tissues. Leave stitches (sutures), skin glue, or adhesive strips in place. These skin closures may need to stay in place for 2 weeks or longer. If adhesive strip edges start to loosen and curl up, you may trim the loose edges. Do not remove adhesive strips completely unless your health care provider tells  you to do that. Check your wound every day for signs of infection. Check for: More redness, swelling, or pain. More fluid or blood. Warmth. Pus or a bad smell. If you were sent home with a drain tube in place, follow instructions from your health care provider about: How to empty it. How to care for it at home.  General instructions Rest the affected area. Do not take baths, swim, or use a hot tub until your health care provider approves. Ask your health care provider if you may take showers. You may only be allowed to take sponge baths. Return to your normal activities as told by your health care provider. Ask your health care provider what activities are safe for you. Your health care provider may put you on activity or lifting restrictions. The incision will continue to drain. It is normal to have some clear or slightly bloody drainage. The amount of drainage should lessen each day. Do not apply any creams, ointments, or liquids unless you have been told to by your health care provider. Keep all follow-up visits as told by your health care provider. This is important. Contact a health care provider if: Your cyst or abscess returns. You have more redness, swelling, or pain around your incision. You have more fluid or blood coming from your incision. Your incision feels warm to the touch. You have pus or a bad smell coming from your incision. You have red streaks above or below the incision site. Get help right away if: You have severe pain   or bleeding. You cannot eat or drink without vomiting. You have a fever or chills. You have redness that spreads quickly. You have decreased urine output. You become short of breath. You have chest pain. You cough up blood. The affected area becomes numb or starts to tingle. These symptoms may represent a serious problem that is an emergency. Do not wait to see if the symptoms will go away. Get medical help right away. Call your local emergency  services (911 in the [place] Do not drive yourself to the hospital. Summary After this procedure, it is common to have fluid, blood, or pus coming from the surgery site. Follow all home care instructions. You will be told how to take care of your incision, how to check for infection, and how to take medicines. If you were prescribed an antibiotic medicine, take it as told by your health care provider. Do not stop taking the antibiotic even if you start to feel better. Contact a health care provider if you have increased redness, swelling, or pain around your incision. Get help right away if you have chest pain, you vomit, you cough up blood, or you have shortness of breath. Keep all follow-up visits as told by your health care provider. This is important. This information is not intended to replace advice given to you by your health care provider. Make sure you discuss any questions you have with your health care provider. Document Revised: 01/24/2022 Document Reviewed: 08/02/2021 Elsevier Patient Education  2023 [employer]  

## 2022-07-11 NOTE — Progress Notes (Signed)
She presents today not having any further drainage or any residual wound.  She continues to apply dressing, but there is no evidence of drainage, no open wound, no residual fluctuance or induration.  She has complete epidermal coverage over the upper right breast area without evidence of any residual lesion. I believe there is no need for any continued dressings, home visiting nursing or need for routine follow-up.  Glad to see this pleasant couple again as needed. Our face-to-face time was 10 minutes.

## 2022-07-12 DIAGNOSIS — C8300 Small cell B-cell lymphoma, unspecified site: Secondary | ICD-10-CM | POA: Diagnosis not present

## 2022-07-12 DIAGNOSIS — I482 Chronic atrial fibrillation, unspecified: Secondary | ICD-10-CM | POA: Diagnosis not present

## 2022-07-12 DIAGNOSIS — I5030 Unspecified diastolic (congestive) heart failure: Secondary | ICD-10-CM | POA: Diagnosis not present

## 2022-07-12 DIAGNOSIS — N611 Abscess of the breast and nipple: Secondary | ICD-10-CM | POA: Diagnosis not present

## 2022-07-12 DIAGNOSIS — I5022 Chronic systolic (congestive) heart failure: Secondary | ICD-10-CM | POA: Diagnosis not present

## 2022-07-12 DIAGNOSIS — C4352 Malignant melanoma of skin of breast: Secondary | ICD-10-CM | POA: Diagnosis not present

## 2022-07-16 DIAGNOSIS — I5030 Unspecified diastolic (congestive) heart failure: Secondary | ICD-10-CM | POA: Diagnosis not present

## 2022-07-16 DIAGNOSIS — I482 Chronic atrial fibrillation, unspecified: Secondary | ICD-10-CM | POA: Diagnosis not present

## 2022-07-16 DIAGNOSIS — C8300 Small cell B-cell lymphoma, unspecified site: Secondary | ICD-10-CM | POA: Diagnosis not present

## 2022-07-16 DIAGNOSIS — I5022 Chronic systolic (congestive) heart failure: Secondary | ICD-10-CM | POA: Diagnosis not present

## 2022-07-16 DIAGNOSIS — C4352 Malignant melanoma of skin of breast: Secondary | ICD-10-CM | POA: Diagnosis not present

## 2022-07-16 DIAGNOSIS — N611 Abscess of the breast and nipple: Secondary | ICD-10-CM | POA: Diagnosis not present

## 2022-07-17 DIAGNOSIS — Z7901 Long term (current) use of anticoagulants: Secondary | ICD-10-CM | POA: Diagnosis not present

## 2022-07-19 DIAGNOSIS — E871 Hypo-osmolality and hyponatremia: Secondary | ICD-10-CM | POA: Diagnosis not present

## 2022-07-19 DIAGNOSIS — E785 Hyperlipidemia, unspecified: Secondary | ICD-10-CM | POA: Diagnosis not present

## 2022-07-19 DIAGNOSIS — C4352 Malignant melanoma of skin of breast: Secondary | ICD-10-CM | POA: Diagnosis not present

## 2022-07-19 DIAGNOSIS — Z8616 Personal history of COVID-19: Secondary | ICD-10-CM | POA: Diagnosis not present

## 2022-07-19 DIAGNOSIS — Z7901 Long term (current) use of anticoagulants: Secondary | ICD-10-CM | POA: Diagnosis not present

## 2022-07-19 DIAGNOSIS — N611 Abscess of the breast and nipple: Secondary | ICD-10-CM | POA: Diagnosis not present

## 2022-07-19 DIAGNOSIS — I5022 Chronic systolic (congestive) heart failure: Secondary | ICD-10-CM | POA: Diagnosis not present

## 2022-07-19 DIAGNOSIS — I482 Chronic atrial fibrillation, unspecified: Secondary | ICD-10-CM | POA: Diagnosis not present

## 2022-07-19 DIAGNOSIS — C8300 Small cell B-cell lymphoma, unspecified site: Secondary | ICD-10-CM | POA: Diagnosis not present

## 2022-07-19 DIAGNOSIS — I5030 Unspecified diastolic (congestive) heart failure: Secondary | ICD-10-CM | POA: Diagnosis not present

## 2022-07-26 DIAGNOSIS — I482 Chronic atrial fibrillation, unspecified: Secondary | ICD-10-CM | POA: Diagnosis not present

## 2022-07-26 DIAGNOSIS — N611 Abscess of the breast and nipple: Secondary | ICD-10-CM | POA: Diagnosis not present

## 2022-07-26 DIAGNOSIS — I5022 Chronic systolic (congestive) heart failure: Secondary | ICD-10-CM | POA: Diagnosis not present

## 2022-07-26 DIAGNOSIS — I5030 Unspecified diastolic (congestive) heart failure: Secondary | ICD-10-CM | POA: Diagnosis not present

## 2022-07-26 DIAGNOSIS — C4352 Malignant melanoma of skin of breast: Secondary | ICD-10-CM | POA: Diagnosis not present

## 2022-07-26 DIAGNOSIS — C8300 Small cell B-cell lymphoma, unspecified site: Secondary | ICD-10-CM | POA: Diagnosis not present

## 2022-07-29 ENCOUNTER — Telehealth: Payer: Self-pay

## 2022-07-29 DIAGNOSIS — L0103 Bullous impetigo: Secondary | ICD-10-CM | POA: Diagnosis not present

## 2022-07-29 NOTE — Telephone Encounter (Signed)
Patient called office today with concerns for arm. States that she had small abscess that ruptured green discharge. Has been keeping arm wrapped. Discharge has stopped, but has noticed redness on arm that is spreading. Patient has not seen anyone for this new concern.  Advised she call her PCP today to be seen urgently. Will also follow up with Dermatology.  Would like message sent to Dr. Delaine Lame to see if she needs appointment with Infectious disease.  Leatrice Jewels, RMA

## 2022-07-29 NOTE — Telephone Encounter (Signed)
Patient called back to schedule. Saw Dermatology today who did culture. Will send in topical antibiotic to use three times a day. Scheduled to see Dr. Delaine Lame tomorrow at 830.  Leatrice Jewels, RMA

## 2022-07-29 NOTE — Telephone Encounter (Signed)
Called patient to schedule appointment with Dr. Delaine Lame. Patient is on the way to Dermatology. Would like to call office back after appointment. Leatrice Jewels, RMA

## 2022-07-30 ENCOUNTER — Ambulatory Visit: Payer: Medicare Other | Attending: Infectious Diseases | Admitting: Infectious Diseases

## 2022-07-30 ENCOUNTER — Encounter: Payer: Self-pay | Admitting: Infectious Diseases

## 2022-07-30 VITALS — BP 128/77 | HR 57 | Temp 97.5°F | Ht 66.5 in | Wt 169.0 lb

## 2022-07-30 DIAGNOSIS — T148XXA Other injury of unspecified body region, initial encounter: Secondary | ICD-10-CM | POA: Diagnosis not present

## 2022-07-30 DIAGNOSIS — I4891 Unspecified atrial fibrillation: Secondary | ICD-10-CM | POA: Insufficient documentation

## 2022-07-30 DIAGNOSIS — Z7901 Long term (current) use of anticoagulants: Secondary | ICD-10-CM | POA: Diagnosis not present

## 2022-07-30 DIAGNOSIS — X58XXXA Exposure to other specified factors, initial encounter: Secondary | ICD-10-CM | POA: Insufficient documentation

## 2022-07-30 DIAGNOSIS — A4901 Methicillin susceptible Staphylococcus aureus infection, unspecified site: Secondary | ICD-10-CM | POA: Insufficient documentation

## 2022-07-30 DIAGNOSIS — S51801A Unspecified open wound of right forearm, initial encounter: Secondary | ICD-10-CM

## 2022-07-30 DIAGNOSIS — C50911 Malignant neoplasm of unspecified site of right female breast: Secondary | ICD-10-CM | POA: Diagnosis not present

## 2022-07-30 DIAGNOSIS — Z9221 Personal history of antineoplastic chemotherapy: Secondary | ICD-10-CM | POA: Diagnosis not present

## 2022-07-30 NOTE — Patient Instructions (Signed)
You are here for a pustule that developed on the rt forearm and that has deroofed and now you have a superficial raw area with surrounding discoloration.. continue to use bactroban given by the dermatologist-dont need oral antibiotic now but if it gets worse can take augmentin. The dermatologist has done culture and will await the result

## 2022-07-30 NOTE — Progress Notes (Signed)
NAME: Carol [name]  DOB: [birth year]  MRN: [id number]  Date/Time: 07/30/2022 8:40 AM  Subjective:  - here with her husband Catricia Scheerer is a 81 y.o. with a history of Right breast carcinoma status postlumpectomy and chemo in the past, lymphoma low-grade B cell, A-fib status post ablation and multiple cardioversions on Coumadin, melanoma excision from the skin on the right breast on 3/41/9622 complicated by MSSA infection which needed debridement and eventually healed after a long course of antibiotics  Today Pt is here to see me for a rt forearm infection- she says it started as a small pustule and ruptured- there was redness around- saw dermatologist yesterday  and he took culture and gave her topical bactroban  Pt recently had MSSA abscess rt breast area and it healed dup completely after 3 months So she was worried when this new pustule showed up and came to see me Denies any fever or chills Denies any trauma    Past Medical History:  Diagnosis Date   A-fib ([place])    Chronic sinusitis    Fibrocystic breast disease    History of pneumonia 1999   Hyperlipidemia    Irritable bowel syndrome    Lichen planus    lymphoma August 2013   Low grade B cell   Mild tricuspid insufficiency Jan 2012   ECHO, [place]   Moderate mitral insufficiency JAN 2012   [facility], [place]    Past Surgical History:  Procedure Laterality Date   ABDOMINAL HYSTERECTOMY     precancerous cervix,     ABLATION OF DYSRHYTHMIC FOCUS  August 2013   [facility], Dr. Marcello Moores   BREAST SURGERY     bilateral, benign biopsies   CARDIOVERSION N/A 11/23/2020   Procedure: CARDIOVERSION;  Surgeon: Corey Skains, MD;  Location: [facility];  Service: Cardiovascular;  Laterality: N/A;   CARDIOVERSION N/A 01/17/2021   Procedure: CARDIOVERSION;  Surgeon: Corey Skains, MD;  Location: [facility];  Service: Cardiovascular;  Laterality: N/A;   CARDIOVERSION N/A 01/31/2021   Procedure: CARDIOVERSION;  Surgeon:  Corey Skains, MD;  Location: [place] [facility];  Service: Cardiovascular;  Laterality: N/A;   CARDIOVERSION N/A 10/09/2021   Procedure: CARDIOVERSION;  Surgeon: Corey Skains, MD;  Location: [place] [facility];  Service: Cardiovascular;  Laterality: N/A;   CARDIOVERSION N/A 01/17/2022   Procedure: CARDIOVERSION;  Surgeon: Corey Skains, MD;  Location: [facility];  Service: Cardiovascular;  Laterality: N/A;   INCISION AND DRAINAGE ABSCESS Right 04/25/2022   Procedure: INCISION AND DRAINAGE ABSCESS;  Surgeon: Ronny Bacon, MD;  Location: [facility];  Service: General;  Laterality: Right;   MAXILLARY SINUS LIFT  1990's   [name]   oophorectomy     squamous cell removal Right    right leg calf area.   TEE WITHOUT CARDIOVERSION N/A 01/31/2021   Procedure: TRANSESOPHAGEAL ECHOCARDIOGRAM (TEE);  Surgeon: Corey Skains, MD;  Location: [facility];  Service: Cardiovascular;  Laterality: N/A;   TEE WITHOUT CARDIOVERSION N/A 10/09/2021   Procedure: TRANSESOPHAGEAL ECHOCARDIOGRAM (TEE);  Surgeon: Corey Skains, MD;  Location: [facility];  Service: Cardiovascular;  Laterality: N/A;    Social History   Socioeconomic History   Marital status: Married    Spouse name: [name]    Number of children: 2   Years of education: Not on file   Highest education level: Not on file  Occupational History   Not on file  Tobacco Use   Smoking status: Never   Smokeless tobacco: Never  Vaping Use  Vaping Use: Not on file  Substance and Sexual Activity   Alcohol use: Yes    Alcohol/week: 4.0 standard drinks of alcohol    Types: 4 Glasses of wine per week    Comment: Occ.   Drug use: No   Sexual activity: Not Currently    Birth control/protection: Post-menopausal  Other Topics Concern   Not on file  Social History Narrative   Lives at home with spouse    Social Determinants of Health   Financial Resource Strain: Low Risk  (09/20/2021)   Overall Financial Resource Strain (CARDIA)    Difficulty of Paying  Living Expenses: Not hard at all  Food Insecurity: No Food Insecurity (09/20/2021)   Hunger Vital Sign    Worried About Running Out of Food in the Last Year: Never true    Ran Out of Food in the Last Year: Never true  Transportation Needs: No Transportation Needs (09/20/2021)   PRAPARE - Hydrologist (Medical): No    Lack of Transportation (Non-Medical): No  Physical Activity: Insufficiently Active (09/20/2021)   Exercise Vital Sign    Days of Exercise per Week: 5 days    Minutes of Exercise per Session: 20 min  Stress: No Stress Concern Present (09/20/2021)   [place]    Feeling of Stress : Not at all  Social Connections: Unknown (09/20/2021)   Social Connection and Isolation Panel [NHANES]    Frequency of Communication with Friends and Family: More than three times a week    Frequency of Social Gatherings with Friends and Family: More than three times a week    Attends Religious Services: Not on file    Active Member of [employer] or Organizations: Not on file    Attends Archivist Meetings: Not on file    Marital Status: Married  Intimate Partner Violence: Not At Risk (09/20/2021)   Humiliation, Afraid, Rape, and Kick questionnaire    Fear of Current or Ex-Partner: No    Emotionally Abused: No    Physically Abused: No    Sexually Abused: No    Family History  Problem Relation Age of Onset   Cancer Mother        Breast   Hyperlipidemia Father    Heart disease Father    Hypertension Father    Kidney disease Father    Cancer Maternal Grandfather        colon CA   Diverticulitis Sister    Allergies  Allergen Reactions   Prednisone Palpitations    A. fib   Pulmicort [Budesonide] Shortness Of Breath, Itching, Swelling and Other (See Comments)    Felt as if things were crawly on her   Clonidine Other (See Comments)    Pt felt like things were crawling on her arms.    Lotemax [Loteprednol Etabonate]     Broke out around the eye   Morphine And Related Itching and Other (See Comments)    "creepy crawly feeling"   Trovan [Alatrofloxacin Mesylate] Other (See Comments)    "effected the whole system"   Zelnorm [Tegaserod Maleate] Itching    Felt as if things were crawly on her    Ancef [Cefazolin] Rash   Erythromycin Rash   I? Current Outpatient Medications  Medication Sig Dispense Refill   acetaminophen (TYLENOL) 500 MG tablet Take 1,000 mg by mouth every 6 (six) hours as needed for moderate pain or headache.     amiodarone (PACERONE)  200 MG tablet Take 200 mg by mouth daily.     b complex vitamins capsule Take 1 capsule by mouth daily.     bisacodyl (DULCOLAX) 5 MG EC tablet Take 5 mg by mouth daily as needed for moderate constipation.     Cholecalciferol (VITAMIN D) 50 MCG (2000 UT) tablet Take 2,000 Units by mouth daily.     diltiazem (CARDIZEM CD) 180 MG 24 hr capsule Take 180 mg by mouth daily.     diphenhydrAMINE (BENADRYL) 25 mg capsule Take 25 mg by mouth every 6 (six) hours as needed for allergies.     docusate sodium (COLACE) 100 MG capsule Take 100 mg by mouth daily as needed for mild constipation.     furosemide (LASIX) 40 MG tablet Take 1 tablet (40 mg total) by mouth daily. More if directed by physician 90 tablet 1   gabapentin (NEURONTIN) 100 MG capsule Take 1-2 capsules (100-200 mg total) by mouth See admin instructions. Patient taking 100 mg in the morning and 200 mg at night.  Home med. 90 capsule 1   Glucosamine-Chondroit-Vit C-Mn (GLUCOSAMINE 1500 COMPLEX PO) Take 1 tablet by mouth 2 (two) times daily.     Glycerin-Polysorbate 80 (REFRESH DRY EYE THERAPY OP) Place 1 drop into both eyes 2 (two) times daily as needed (dry eyes).     MAGNESIUM CITRATE PO Take 400 mg by mouth 2 (two) times daily.     Melatonin 5 MG CAPS Take 5 mg by mouth at bedtime as needed (sleep).     metoprolol succinate (TOPROL-XL) 50 MG 24 hr tablet Take 50-100 mg by  mouth See admin instructions. Take 100 mg in the morning and 50 mg at night     Multiple Vitamin (MULTIVITAMIN WITH MINERALS) TABS tablet Take 0.5 tablets by mouth 2 (two) times daily.     mupirocin ointment (BACTROBAN) 2 % Apply 1 Application topically 3 (three) times daily.     Omega-3 Fatty Acids (FISH OIL) 1000 MG CAPS Take 2,000 mg by mouth in the morning, at noon, and at bedtime.     omeprazole (PRILOSEC) 20 MG capsule TAKE 1 CAPSULE DAILY 90 capsule 3   rosuvastatin (CRESTOR) 5 MG tablet Take 5 mg by mouth 2 (two) times a week. Mondays and Thursdays     Silver (HYDROGEL AG) GEL Apply daily to open wound. Cleanse with Q-tip. 15 mL 0   vitamin E 1000 UNIT capsule Take 1,000 Units by mouth 2 (two) times daily.     warfarin (COUMADIN) 4 MG tablet Take 4 mg by mouth See admin instructions. Take 4 mg daily on Monday, Wednesday and Friday     warfarin (COUMADIN) 5 MG tablet Take 5 mg by mouth 2 (two) times a week. Take 5 mg by mouth daily on Sunday, Tuesday, Thursday and Saturday     No current facility-administered medications for this visit.     Abtx:  Anti-infectives (From admission, onward)    None       REVIEW OF SYSTEMS:  Const: negative fever, negative chills, negative weight loss Eyes: negative diplopia or visual changes, negative eye pain ENT: negative coryza, negative sore throat Resp: negative cough, hemoptysis, dyspnea Cards: negative for chest pain, palpitations, lower extremity edema GU: negative for frequency, dysuria and hematuria GI: Negative for abdominal pain, diarrhea, bleeding, constipation Skin: as above Heme: negative for easy bruising and gum/nose bleeding MS: negative for myalgias, arthralgias, back pain and muscle weakness Neurolo:negative for headaches, dizziness, vertigo, memory problems  Psych:  negative for feelings of anxiety, depression  Endocrine: negative for thyroid, diabetes Allergy/Immunology-as above  Objective:  VITALS:  BP 128/77   Pulse  (!) 57   Temp (!) 97.5 F (36.4 C) (Temporal)   Ht 5' 6.5" (1.689 m)   Wt 169 lb (76.7 kg)   BMI 26.87 kg/m   PHYSICAL EXAM:  General: Alert, cooperative, no distress, appears stated age.  Lungs: Clear to auscultation bilaterally. No Wheezing or Rhonchi. No rales. Heart: Regular rate and rhythm, no murmur, rub or gallop. Abdomen: Sdid not examine Extremities: rt forearm - 1.5 cm superficial wound- does not look grossly infected- some duskiness and erythema around- Skin very thin   Wound rt breast- healed completely    Lymph: Cervical, supraclavicular normal. Neurologic: Grossly non-focal Pertinent Labs none  ? Impression/Recommendation  Rt forearm wound- superficial- does not look grossly infected- continue bactroban Await culture If it gets worse she has augmentin and will take it ? MSSA abscess/phlegmon on the right breast below the site of a previous melanoma excision.  Status post incision and drainage and debridement done on 04/25/2022. Took antibiotics diclox Completely healed  now  Discussed the management with the patient and her husband Follow PRN Note:  This document was prepared using Dragon voice recognition software and may include unintentional dictation errors.

## 2022-08-02 DIAGNOSIS — I5030 Unspecified diastolic (congestive) heart failure: Secondary | ICD-10-CM | POA: Diagnosis not present

## 2022-08-02 DIAGNOSIS — N611 Abscess of the breast and nipple: Secondary | ICD-10-CM | POA: Diagnosis not present

## 2022-08-02 DIAGNOSIS — C8300 Small cell B-cell lymphoma, unspecified site: Secondary | ICD-10-CM | POA: Diagnosis not present

## 2022-08-02 DIAGNOSIS — I482 Chronic atrial fibrillation, unspecified: Secondary | ICD-10-CM | POA: Diagnosis not present

## 2022-08-02 DIAGNOSIS — C4352 Malignant melanoma of skin of breast: Secondary | ICD-10-CM | POA: Diagnosis not present

## 2022-08-02 DIAGNOSIS — I5022 Chronic systolic (congestive) heart failure: Secondary | ICD-10-CM | POA: Diagnosis not present

## 2022-08-10 DIAGNOSIS — Z7901 Long term (current) use of anticoagulants: Secondary | ICD-10-CM | POA: Diagnosis not present

## 2022-08-10 DIAGNOSIS — N611 Abscess of the breast and nipple: Secondary | ICD-10-CM | POA: Diagnosis not present

## 2022-08-10 DIAGNOSIS — Z8616 Personal history of COVID-19: Secondary | ICD-10-CM | POA: Diagnosis not present

## 2022-08-10 DIAGNOSIS — I5030 Unspecified diastolic (congestive) heart failure: Secondary | ICD-10-CM | POA: Diagnosis not present

## 2022-08-10 DIAGNOSIS — C8300 Small cell B-cell lymphoma, unspecified site: Secondary | ICD-10-CM | POA: Diagnosis not present

## 2022-08-10 DIAGNOSIS — I482 Chronic atrial fibrillation, unspecified: Secondary | ICD-10-CM | POA: Diagnosis not present

## 2022-08-10 DIAGNOSIS — I5022 Chronic systolic (congestive) heart failure: Secondary | ICD-10-CM | POA: Diagnosis not present

## 2022-08-10 DIAGNOSIS — E785 Hyperlipidemia, unspecified: Secondary | ICD-10-CM | POA: Diagnosis not present

## 2022-08-10 DIAGNOSIS — E871 Hypo-osmolality and hyponatremia: Secondary | ICD-10-CM | POA: Diagnosis not present

## 2022-08-10 DIAGNOSIS — C4352 Malignant melanoma of skin of breast: Secondary | ICD-10-CM | POA: Diagnosis not present

## 2022-08-14 DIAGNOSIS — Z7901 Long term (current) use of anticoagulants: Secondary | ICD-10-CM | POA: Diagnosis not present

## 2022-08-15 DIAGNOSIS — I480 Paroxysmal atrial fibrillation: Secondary | ICD-10-CM | POA: Diagnosis not present

## 2022-08-23 DIAGNOSIS — C8300 Small cell B-cell lymphoma, unspecified site: Secondary | ICD-10-CM | POA: Diagnosis not present

## 2022-08-23 DIAGNOSIS — N611 Abscess of the breast and nipple: Secondary | ICD-10-CM | POA: Diagnosis not present

## 2022-08-23 DIAGNOSIS — C4352 Malignant melanoma of skin of breast: Secondary | ICD-10-CM | POA: Diagnosis not present

## 2022-08-23 DIAGNOSIS — I5030 Unspecified diastolic (congestive) heart failure: Secondary | ICD-10-CM | POA: Diagnosis not present

## 2022-08-23 DIAGNOSIS — I5022 Chronic systolic (congestive) heart failure: Secondary | ICD-10-CM | POA: Diagnosis not present

## 2022-08-23 DIAGNOSIS — I482 Chronic atrial fibrillation, unspecified: Secondary | ICD-10-CM | POA: Diagnosis not present

## 2022-08-28 DIAGNOSIS — R791 Abnormal coagulation profile: Secondary | ICD-10-CM | POA: Diagnosis not present

## 2022-08-30 ENCOUNTER — Telehealth: Payer: Self-pay

## 2022-08-30 NOTE — Telephone Encounter (Signed)
Collie Siad called from [facility] to state patient has developed a small ulcer on right arm which has pretty much healed.  Collie Siad states she is not sure when patient is planning to come back in town.  Collie Siad states they may discharge patient at next visit which is scheduled for 09/06/2022.

## 2022-09-02 NOTE — Telephone Encounter (Signed)
FYI

## 2022-09-09 DIAGNOSIS — Z85820 Personal history of malignant melanoma of skin: Secondary | ICD-10-CM | POA: Diagnosis not present

## 2022-09-09 DIAGNOSIS — Z85828 Personal history of other malignant neoplasm of skin: Secondary | ICD-10-CM | POA: Diagnosis not present

## 2022-09-09 DIAGNOSIS — D225 Melanocytic nevi of trunk: Secondary | ICD-10-CM | POA: Diagnosis not present

## 2022-09-09 DIAGNOSIS — D2261 Melanocytic nevi of right upper limb, including shoulder: Secondary | ICD-10-CM | POA: Diagnosis not present

## 2022-09-09 DIAGNOSIS — C44519 Basal cell carcinoma of skin of other part of trunk: Secondary | ICD-10-CM | POA: Diagnosis not present

## 2022-09-09 DIAGNOSIS — L821 Other seborrheic keratosis: Secondary | ICD-10-CM | POA: Diagnosis not present

## 2022-09-09 DIAGNOSIS — Z08 Encounter for follow-up examination after completed treatment for malignant neoplasm: Secondary | ICD-10-CM | POA: Diagnosis not present

## 2022-09-09 DIAGNOSIS — D2272 Melanocytic nevi of left lower limb, including hip: Secondary | ICD-10-CM | POA: Diagnosis not present

## 2022-09-09 DIAGNOSIS — D2262 Melanocytic nevi of left upper limb, including shoulder: Secondary | ICD-10-CM | POA: Diagnosis not present

## 2022-09-09 DIAGNOSIS — L0101 Non-bullous impetigo: Secondary | ICD-10-CM | POA: Diagnosis not present

## 2022-09-09 DIAGNOSIS — D2271 Melanocytic nevi of right lower limb, including hip: Secondary | ICD-10-CM | POA: Diagnosis not present

## 2022-09-10 ENCOUNTER — Telehealth: Payer: Self-pay

## 2022-09-10 DIAGNOSIS — Z7901 Long term (current) use of anticoagulants: Secondary | ICD-10-CM | POA: Diagnosis not present

## 2022-09-10 NOTE — Telephone Encounter (Signed)
Patient returned call and states that bubble on her hairline has green discharge. Has not ruptured and is currently using Bactroban 2% three times a day on area.  Dermatologist is holding off on prescribing oral antibiotics.  Patient is okay with coming to [facility] if needed.  Leatrice Jewels, RMA

## 2022-09-10 NOTE — Telephone Encounter (Signed)
I attempted to contact patient to get her scheduled if needed. Patient did not answer and voicemail left for her to call back

## 2022-09-10 NOTE — Telephone Encounter (Signed)
Patient left voicemail stating she is concerned her infection is back, but this time at her hairline. Noticed small bubble and was advised by Dermatology to follow up with Infectious disease.  Attempted to call patient back for more information, but call was forwarded to voicemail.  Will forward message to covering provider to advise on patient concern.  Leatrice Jewels, RMA

## 2022-09-10 NOTE — Telephone Encounter (Signed)
Patient returned phone call and will continue with the topical treatment. She will call back if no improvement. Carol [name]

## 2022-09-11 ENCOUNTER — Other Ambulatory Visit (INDEPENDENT_AMBULATORY_CARE_PROVIDER_SITE_OTHER): Payer: Medicare Other

## 2022-09-11 ENCOUNTER — Encounter: Payer: Self-pay | Admitting: Family

## 2022-09-11 ENCOUNTER — Telehealth: Payer: Self-pay | Admitting: Internal Medicine

## 2022-09-11 ENCOUNTER — Telehealth (INDEPENDENT_AMBULATORY_CARE_PROVIDER_SITE_OTHER): Payer: Medicare Other | Admitting: Family

## 2022-09-11 VITALS — Ht 66.5 in | Wt 172.0 lb

## 2022-09-11 DIAGNOSIS — R053 Chronic cough: Secondary | ICD-10-CM

## 2022-09-11 DIAGNOSIS — R944 Abnormal results of kidney function studies: Secondary | ICD-10-CM

## 2022-09-11 LAB — BASIC METABOLIC PANEL
BUN: 22 mg/dL (ref 6–23)
CO2: 29 mEq/L (ref 19–32)
Calcium: 9.2 mg/dL (ref 8.4–10.5)
Chloride: 101 mEq/L (ref 96–112)
Creatinine, Ser: 1.05 mg/dL (ref 0.40–1.20)
GFR: 49.65 mL/min — ABNORMAL LOW (ref >60.00)
Glucose, Bld: 105 mg/dL — ABNORMAL HIGH (ref 70–99)
Potassium: 4.3 mEq/L (ref 3.5–5.1)
Sodium: 138 mEq/L (ref 135–145)

## 2022-09-11 MED ORDER — GUAIFENESIN-CODEINE 100-10 MG/5ML PO SYRP: 5.0000 mL | ORAL_SOLUTION | Freq: Three times a day (TID) | ORAL | 0 refills | Status: DC | PRN

## 2022-09-11 NOTE — Progress Notes (Signed)
MyChart Video Visit    Virtual Visit via Video Note   This format is felt to be most appropriate for this patient at this time. Physical exam was limited by quality of the video and audio technology used for the visit. CMA was able to get the patient set up on a video visit.  Patient location: Home. Patient and provider in visit Provider location: Office  I discussed the limitations of evaluation and management by telemedicine and the availability of in person appointments. The patient expressed understanding and agreed to proceed.  Visit Date: 09/11/2022  Today's healthcare provider: Jeanie Sewer, NP     Subjective:   Patient ID: Carol [name], female    DOB: [birth year], 81 y.o.   MRN: [id number]  Chief Complaint  Patient presents with   Cough    sx for 1 week   HPI Persistent cough:  Pt c/o Cough for about a week and progressively getting worse. Has tried robitussin which does not help. Mucus is green but nasal drainage clear. Reports using a steroid nasal spray at home. pt states last time she had a bad cough it sent her into A-fib, pt is on coumadin qd, and she is concerned this will happen again. pt is allergic to prednisone. Denies hx of Asthma or COPD, no fever, no pleuritic CP. Advised to call back if sx are not improving after a few more days.  Assessment & Plan:   Problem List Items Addressed This Visit   None Visit Diagnoses     Persistent cough    -  Primary pt allergic to prednisone, reports Tessalon pearles do not work for her, she has been using Robitussin with mild relief. Offered abt, and Cheratussin syrup (had in past) pt wants to just try the cough syrup first, reminded of use & SE,do not drive while taking, drink plenty of fluids. Continue nasal saline and steroid spray to help with postnasal drip that  may be contributing to cough.    Relevant Medications   guaiFENesin-codeine (ROBITUSSIN AC) 100-10 MG/5ML syrup      Past Medical  History:  Diagnosis Date   A-fib ([place])    Chronic sinusitis    Fibrocystic breast disease    History of pneumonia 1999   Hyperlipidemia    Irritable bowel syndrome    Lichen planus    lymphoma August 2013   Low grade B cell   Mild tricuspid insufficiency Jan 2012   E[facility], [place]   Moderate mitral insufficiency JAN 2012   E[facility], [place]    Past Surgical History:  Procedure Laterality Date   ABDOMINAL HYSTERECTOMY     precancerous cervix,     ABLATION OF DYSRHYTHMIC FOCUS  August 2013   [facility], Dr. Marcello Moores   BREAST SURGERY     bilateral, benign biopsies   CARDIOVERSION N/A 11/23/2020   Procedure: CARDIOVERSION;  Surgeon: Corey Skains, MD;  Location: [place] [facility];  Service: Cardiovascular;  Laterality: N/A;   CARDIOVERSION N/A 01/17/2021   Procedure: CARDIOVERSION;  Surgeon: Corey Skains, MD;  Location: [place] [facility];  Service: Cardiovascular;  Laterality: N/A;   CARDIOVERSION N/A 01/31/2021   Procedure: CARDIOVERSION;  Surgeon: Corey Skains, MD;  Location: [place] [facility];  Service: Cardiovascular;  Laterality: N/A;   CARDIOVERSION N/A 10/09/2021   Procedure: CARDIOVERSION;  Surgeon: Corey Skains, MD;  Location: [facility];  Service: Cardiovascular;  Laterality: N/A;   CARDIOVERSION N/A 01/17/2022   Procedure: CARDIOVERSION;  Surgeon: Corey Skains, MD;  Location:  [facility];  Service: Cardiovascular;  Laterality: N/A;   INCISION AND DRAINAGE ABSCESS Right 04/25/2022   Procedure: INCISION AND DRAINAGE ABSCESS;  Surgeon: Ronny Bacon, MD;  Location: [facility];  Service: General;  Laterality: Right;   MAXILLARY SINUS LIFT  1990's   [name]   oophorectomy     squamous cell removal Right    right leg calf area.   TEE WITHOUT CARDIOVERSION N/A 01/31/2021   Procedure: TRANSESOPHAGEAL ECHOCARDIOGRAM (TEE);  Surgeon: Corey Skains, MD;  Location: [facility];  Service: Cardiovascular;  Laterality: N/A;   TEE WITHOUT CARDIOVERSION N/A 10/09/2021   Procedure: TRANSESOPHAGEAL  ECHOCARDIOGRAM (TEE);  Surgeon: Corey Skains, MD;  Location: [facility];  Service: Cardiovascular;  Laterality: N/A;    Outpatient Medications Prior to Visit  Medication Sig Dispense Refill   acetaminophen (TYLENOL) 500 MG tablet Take 1,000 mg by mouth every 6 (six) hours as needed for moderate pain or headache.     amiodarone (PACERONE) 200 MG tablet Take 200 mg by mouth daily.     b complex vitamins capsule Take 1 capsule by mouth daily.     bisacodyl (DULCOLAX) 5 MG EC tablet Take 5 mg by mouth daily as needed for moderate constipation.     Cholecalciferol (VITAMIN D) 50 MCG (2000 UT) tablet Take 2,000 Units by mouth daily.     diltiazem (CARDIZEM CD) 180 MG 24 hr capsule Take 180 mg by mouth daily.     diphenhydrAMINE (BENADRYL) 25 mg capsule Take 25 mg by mouth every 6 (six) hours as needed for allergies.     docusate sodium (COLACE) 100 MG capsule Take 100 mg by mouth daily as needed for mild constipation.     furosemide (LASIX) 40 MG tablet Take 1 tablet (40 mg total) by mouth daily. More if directed by physician 90 tablet 1   gabapentin (NEURONTIN) 100 MG capsule Take 1-2 capsules (100-200 mg total) by mouth See admin instructions. Patient taking 100 mg in the morning and 200 mg at night.  Home med. 90 capsule 1   Glucosamine-Chondroit-Vit C-Mn (GLUCOSAMINE 1500 COMPLEX PO) Take 1 tablet by mouth 2 (two) times daily.     Glycerin-Polysorbate 80 (REFRESH DRY EYE THERAPY OP) Place 1 drop into both eyes 2 (two) times daily as needed (dry eyes).     MAGNESIUM CITRATE PO Take 400 mg by mouth 2 (two) times daily.     Melatonin 5 MG CAPS Take 5 mg by mouth at bedtime as needed (sleep).     metoprolol succinate (TOPROL-XL) 50 MG 24 hr tablet Take 50-100 mg by mouth See admin instructions. Take 100 mg in the morning and 50 mg at night     Multiple Vitamin (MULTIVITAMIN WITH MINERALS) TABS tablet Take 0.5 tablets by mouth 2 (two) times daily.     mupirocin ointment (BACTROBAN) 2 % Apply 1  Application topically 3 (three) times daily.     Omega-3 Fatty Acids (FISH OIL) 1000 MG CAPS Take 2,000 mg by mouth in the morning, at noon, and at bedtime.     omeprazole (PRILOSEC) 20 MG capsule TAKE 1 CAPSULE DAILY 90 capsule 3   rosuvastatin (CRESTOR) 5 MG tablet Take 5 mg by mouth 2 (two) times a week. Mondays and Thursdays     Silver (HYDROGEL AG) GEL Apply daily to open wound. Cleanse with Q-tip. 15 mL 0   vitamin E 1000 UNIT capsule Take 1,000 Units by mouth 2 (two) times daily.     warfarin (COUMADIN)  4 MG tablet Take 4 mg by mouth See admin instructions. Take 4 mg daily on Monday, Wednesday and Friday     warfarin (COUMADIN) 5 MG tablet Take 5 mg by mouth 2 (two) times a week. Take 5 mg by mouth daily on Sunday, Tuesday, Thursday and Saturday     No facility-administered medications prior to visit.    Allergies  Allergen Reactions   Prednisone Palpitations    A. fib   Pulmicort [Budesonide] Shortness Of Breath, Itching, Swelling and Other (See Comments)    Felt as if things were crawly on her   Clonidine Other (See Comments)    Pt felt like things were crawling on her arms.   Lotemax [Loteprednol Etabonate]     Broke out around the eye   Morphine And Related Itching and Other (See Comments)    "creepy crawly feeling"   Trovan [Alatrofloxacin Mesylate] Other (See Comments)    "effected the whole system"   Zelnorm [Tegaserod Maleate] Itching    Felt as if things were crawly on her    Ancef [Cefazolin] Rash   Erythromycin Rash       Objective:   Physical Exam Vitals and nursing note reviewed.  Constitutional:      General: Pt is not in acute distress.    Appearance: Normal appearance.  HENT:     Head: Normocephalic.  Pulmonary:     Effort: No respiratory distress.  Musculoskeletal:     Cervical back: Normal range of motion.  Skin:    General: Skin is dry.     Coloration: Skin is not pale.  Neurological:     Mental Status: Pt is alert and oriented to person,  place, and time.  Psychiatric:        Mood and Affect: Mood normal.   Ht 5' 6.5" (1.689 m)   Wt 172 lb (78 kg)   BMI 27.35 kg/m   Wt Readings from Last 3 Encounters:  09/11/22 172 lb (78 kg)  07/30/22 169 lb (76.7 kg)  07/11/22 172 lb 3.2 oz (78.1 kg)        I discussed the assessment and treatment plan with the patient. The patient was provided an opportunity to ask questions and all were answered. The patient agreed with the plan and demonstrated an understanding of the instructions.   The patient was advised to call back or seek an in-person evaluation if the symptoms worsen or if the condition fails to improve as anticipated.  Jeanie Sewer, NP [place] [phone] (phone) [phone] (fax)  [place]

## 2022-09-11 NOTE — Telephone Encounter (Signed)
Pt is requested a cough syrup. Pt was advised that she would need to schedule a virtual visit to discuss. Pt has been scheduled with Jeanie Sewer, NP at [facility] for a virtual visit.

## 2022-09-11 NOTE — Telephone Encounter (Signed)
Pt would like the provider to send in a cough syrup. Pt states she gets this medication from the provider when she has this issue

## 2022-09-11 NOTE — Patient Instructions (Addendum)
It was very nice to see you today!   I have sent over a refill for the Cheratussin syrup for your cough. Remember this can cause drowsiness, so please do not drive while taking. Continue to use your nasal saline several times per day and your steroid spray can be increased to twice a day for the next few days to help with postnasal drip that  may be contributing to the cough. Continue drinking  plenty of fluids.  Call back if your symptoms are not resolving after a few more days.     PLEASE NOTE:  If you had any lab tests please let us know if you have not heard back within a few days. You may see your results on MyChart before we have a chance to review them but we will give you a call once they are reviewed by [place]. If we ordered any referrals today, please let us know if you have not heard from their office within the next week.

## 2022-09-12 DIAGNOSIS — L820 Inflamed seborrheic keratosis: Secondary | ICD-10-CM | POA: Diagnosis not present

## 2022-09-12 DIAGNOSIS — D485 Neoplasm of uncertain behavior of skin: Secondary | ICD-10-CM | POA: Diagnosis not present

## 2022-09-12 DIAGNOSIS — R208 Other disturbances of skin sensation: Secondary | ICD-10-CM | POA: Diagnosis not present

## 2022-09-12 DIAGNOSIS — L538 Other specified erythematous conditions: Secondary | ICD-10-CM | POA: Diagnosis not present

## 2022-09-13 ENCOUNTER — Telehealth: Payer: Self-pay

## 2022-09-13 NOTE — Telephone Encounter (Signed)
-----   Message from Crecencio Mc, MD sent at 09/13/2022  8:34 AM EST ----- Your sodium has NORMALIZED BUT your kidney function , although stale,  remains lower than normal. (Since July).  Please schedule a visit to discuss (it can be virtual)    Regards,   [name], MD

## 2022-09-13 NOTE — Telephone Encounter (Signed)
LMTCB in regards to lab results.  

## 2022-09-16 NOTE — Telephone Encounter (Signed)
Patient states she is returning a call from Adair Laundry, [place].  I read note from Dr. [name] to patient.  Patient states she would rather come in person for her visit.  I scheduled patient to see Dr. Derrel Nip on 09/20/2022.

## 2022-09-16 NOTE — Telephone Encounter (Signed)
Noted  

## 2022-09-20 ENCOUNTER — Ambulatory Visit (INDEPENDENT_AMBULATORY_CARE_PROVIDER_SITE_OTHER): Payer: Medicare Other | Admitting: Internal Medicine

## 2022-09-20 ENCOUNTER — Encounter: Payer: Self-pay | Admitting: Internal Medicine

## 2022-09-20 VITALS — BP 120/84 | HR 59 | Temp 97.6°F | Ht 66.5 in | Wt 176.0 lb

## 2022-09-20 DIAGNOSIS — I482 Chronic atrial fibrillation, unspecified: Secondary | ICD-10-CM | POA: Diagnosis not present

## 2022-09-20 DIAGNOSIS — I5022 Chronic systolic (congestive) heart failure: Secondary | ICD-10-CM

## 2022-09-20 DIAGNOSIS — R053 Chronic cough: Secondary | ICD-10-CM

## 2022-09-20 DIAGNOSIS — I679 Cerebrovascular disease, unspecified: Secondary | ICD-10-CM | POA: Diagnosis not present

## 2022-09-20 DIAGNOSIS — J40 Bronchitis, not specified as acute or chronic: Secondary | ICD-10-CM | POA: Diagnosis not present

## 2022-09-20 DIAGNOSIS — R944 Abnormal results of kidney function studies: Secondary | ICD-10-CM

## 2022-09-20 DIAGNOSIS — D6869 Other thrombophilia: Secondary | ICD-10-CM

## 2022-09-20 DIAGNOSIS — I420 Dilated cardiomyopathy: Secondary | ICD-10-CM

## 2022-09-20 DIAGNOSIS — N1831 Chronic kidney disease, stage 3a: Secondary | ICD-10-CM

## 2022-09-20 DIAGNOSIS — I10 Essential (primary) hypertension: Secondary | ICD-10-CM

## 2022-09-20 MED ORDER — GUAIFENESIN-CODEINE 100-10 MG/5ML PO SYRP: 10.0000 mL | ORAL_SOLUTION | Freq: Two times a day (BID) | ORAL | 0 refills | Status: DC | PRN

## 2022-09-20 NOTE — Assessment & Plan Note (Signed)
secondary to chronic atrial fibrillatin. Patient has no signs of bleeding and is advised to notify her specialists prior to any procedure that may required suspension of warfarin

## 2022-09-20 NOTE — Patient Instructions (Addendum)
  I agree with reducing your furosemide to 20 mg daily and WEIGHING DAILY   IF YOU EXPERIENCE A WEIGHT GAIN OF 2 LBS OVERNIGHT,  OR 5 LBS OVER A WEEK,  TAKE AN EXTRA DOSE OF 20 MG    FINISH THE FULL COURSE OF DOXYCYCLINE BECAUSE YOU DO SOUND LIKE YOU ARE RECOVERING FROM BRONCHITIS  Daily use of Probiotics for  3 weeks advised to reduce risk of C dificile colitis.    WE WILL REPEAT YOUR KIDNEY FUNCTION TEST WHEN YOU RETURN . IF IT'S STILL LOW,   I WILL ORDER A RENAL ULTRASOUND AND A NEPHROLOGY [facility]  cHERATUSSIN HAS BEEN REFILLED FOR TWICE DAILY AS NEEDED USE

## 2022-09-20 NOTE — Assessment & Plan Note (Signed)
Noted on MRI done in 2018 during workup for TIA.

## 2022-09-20 NOTE — Progress Notes (Addendum)
Subjective:  Patient ID: Carol [name], female    DOB: 1941-09-30  Age: 81 y.o. MRN: [id number]  CC: The primary encounter diagnosis was Ill-defined cerebrovascular disease. Diagnoses of Acquired thrombophilia ([place]), Chronic atrial fibrillation ([place]), Bronchitis, Decreased GFR, Persistent cough, Cardiomyopathy, dilated, nonischemic (HCC), Benign essential HTN, Chronic systolic CHF (congestive heart failure) ([place]), and Stage 3a chronic kidney disease ([place]) were also pertinent to this visit.   HPI Carol [name] presents for  Chief Complaint  Patient presents with   Follow-up    Follow up on lab results.     81 yr old female with aortic atherosclerosis,  paroxysmal  atrial fibrillation managed with amiodarone , metoprolol and cardizem, history of ablation at [facility] in August 2013 , multiple cardioversions  for PAF, last one Dec 2022, chronic anticoagulation with warfarin , presents to review recent labs which indicate a persistently lower than normal GFR since July .  She Takes furosemide regularly for dilated cardiomyopathy ,and her dose was increased to 40 mg daily in May 2023 due to increased leg edema.  She is avoiding salt in her diet and is walking as tolerated for exercise. . She is avoiding NSAIDs and has no history of diabetes/  We reviewed her last ECHO that wAS DONE DURING hospital admission in Dec 2022 for acute on chronic heart failutre secondary to PAF with RVR .  ;her  EF was 40% by Dec 2022.  Her last visit with cardiology was in September . No changes to meds were made Dr Mariah Milling    By  y Outpatient Medications Prior to Visit  Medication Sig Dispense Refill   acetaminophen (TYLENOL) 500 MG tablet Take 1,000 mg by mouth every 6 (six) hours as needed for moderate pain or headache.     amiodarone (PACERONE) 200 MG tablet Take 200 mg by mouth daily.     b complex vitamins capsule Take 1 capsule by mouth daily.     bisacodyl (DULCOLAX) 5 MG EC tablet Take 5 mg  by mouth daily as needed for moderate constipation.     Cholecalciferol (VITAMIN D) 50 MCG (2000 UT) tablet Take 2,000 Units by mouth daily.     diltiazem (CARDIZEM CD) 180 MG 24 hr capsule Take 180 mg by mouth daily.     diphenhydrAMINE (BENADRYL) 25 mg capsule Take 25 mg by mouth every 6 (six) hours as needed for allergies.     docusate sodium (COLACE) 100 MG capsule Take 100 mg by mouth daily as needed for mild constipation.     gabapentin (NEURONTIN) 100 MG capsule Take 1-2 capsules (100-200 mg total) by mouth See admin instructions. Patient taking 100 mg in the morning and 200 mg at night.  Home med. 90 capsule 1   Glucosamine-Chondroit-Vit C-Mn (GLUCOSAMINE 1500 COMPLEX PO) Take 1 tablet by mouth 2 (two) times daily.     Glycerin-Polysorbate 80 (REFRESH DRY EYE THERAPY OP) Place 1 drop into both eyes 2 (two) times daily as needed (dry eyes).     MAGNESIUM CITRATE PO Take 400 mg by mouth 2 (two) times daily.     Melatonin 5 MG CAPS Take 5 mg by mouth at bedtime as needed (sleep).     metoprolol succinate (TOPROL-XL) 50 MG 24 hr tablet Take 50-100 mg by mouth See admin instructions. Take 100 mg in the morning and 50 mg at night     Multiple Vitamin (MULTIVITAMIN WITH MINERALS) TABS tablet Take 0.5 tablets by mouth 2 (two) times daily.  mupirocin ointment (BACTROBAN) 2 % Apply 1 Application topically 3 (three) times daily.     Omega-3 Fatty Acids (FISH OIL) 1000 MG CAPS Take 2,000 mg by mouth in the morning, at noon, and at bedtime.     rosuvastatin (CRESTOR) 5 MG tablet Take 5 mg by mouth 2 (two) times a week. Mondays and Thursdays     Silver (HYDROGEL AG) GEL Apply daily to open wound. Cleanse with Q-tip. 15 mL 0   vitamin E 1000 UNIT capsule Take 1,000 Units by mouth 2 (two) times daily.     warfarin (COUMADIN) 4 MG tablet Take 4 mg by mouth See admin instructions. Take 4 mg daily on Monday, Wednesday and Friday     warfarin (COUMADIN) 5 MG tablet Take 5 mg by mouth 2 (two) times a week.  Take 5 mg by mouth daily on Sunday, Tuesday, Thursday and Saturday     doxycycline (VIBRAMYCIN) 100 MG capsule Take 100 mg by mouth 2 (two) times daily. (Patient not taking: Reported on 11/08/2022)     furosemide (LASIX) 40 MG tablet Take 1 tablet (40 mg total) by mouth daily. More if directed by physician (Patient not taking: Reported on 11/08/2022) 90 tablet 1   guaiFENesin-codeine (ROBITUSSIN AC) 100-10 MG/5ML syrup Take 5 mLs by mouth 3 (three) times daily as needed for cough. 120 mL 0   omeprazole (PRILOSEC) 20 MG capsule TAKE 1 CAPSULE DAILY 90 capsule 3   No facility-administered medications prior to visit.    Review of Systems;  Patient denies headache, fevers, malaise, unintentional weight loss, skin rash, eye pain, sinus congestion and sinus pain, sore throat, dysphagia,  hemoptysis , cough, dyspnea, wheezing, chest pain, palpitations, orthopnea, edema, abdominal pain, nausea, melena, diarrhea, constipation, flank pain, dysuria, hematuria, urinary  Frequency, nocturia, numbness, tingling, seizures,  Focal weakness, Loss of consciousness,  Tremor, insomnia, depression, anxiety, and suicidal ideation.      Objective:  BP 120/84 (BP Location: Left Arm, Patient Position: Sitting, Cuff Size: Normal)   Pulse (!) 59   Temp 97.6 F (36.4 C) (Oral)   Ht 5' 6.5" (1.689 m)   Wt 176 lb (79.8 kg)   SpO2 98%   BMI 27.98 kg/m   BP Readings from Last 3 Encounters:  11/08/22 134/82  09/20/22 120/84  07/30/22 128/77    Wt Readings from Last 3 Encounters:  11/08/22 176 lb 9.6 oz (80.1 kg)  10/03/22 176 lb (79.8 kg)  09/20/22 176 lb (79.8 kg)    General appearance: alert, cooperative and appears stated age Ears: normal TM's and external ear canals both ears Throat: lips, mucosa, and tongue normal; teeth and gums normal Neck: no adenopathy, no carotid bruit, supple, symmetrical, trachea midline and thyroid not enlarged, symmetric, no tenderness/mass/nodules Back: symmetric, no curvature.  ROM normal. No CVA tenderness. Lungs: clear to auscultation bilaterally Heart: regular rate and rhythm, S1, S2 normal, no murmur, click, rub or gallop Abdomen: soft, non-tender; bowel sounds normal; no masses,  no organomegaly Pulses: 2+ and symmetric Skin: Skin color, texture, turgor normal. No rashes or lesions Lymph nodes: Cervical, supraclavicular, and axillary nodes normal. Neuro:  awake and interactive with normal mood and affect. Higher cortical functions are normal. Speech is clear without word-finding difficulty or dysarthria. Extraocular movements are intact. Visual fields of both eyes are grossly intact. Sensation to light touch is grossly intact bilaterally of upper and lower extremities. Motor examination shows 4+/5 symmetric hand grip and upper extremity and 5/5 lower extremity strength. There is no  pronation or drift. Gait is non-ataxic   Lab Results  Component Value Date   HGBA1C 5.7 (H) 10/08/2021   HGBA1C 5.7 (H) 10/07/2021   HGBA1C 5.7 05/27/2016    Lab Results  Component Value Date   CREATININE 1.05 09/11/2022   CREATININE 1.03 06/07/2022   CREATININE 1.06 05/08/2022    Lab Results  Component Value Date   WBC 7.6 04/27/2022   HGB 12.1 04/27/2022   HCT 36.0 04/27/2022   PLT 311 04/27/2022   GLUCOSE 105 (H) 09/11/2022   CHOL 203 (H) 06/07/2022   TRIG 106.0 06/07/2022   HDL 59.20 06/07/2022   LDLDIRECT 156.0 04/05/2020   LDLCALC 123 (H) 06/07/2022   ALT 20 05/08/2022   AST 30 05/08/2022   NA 138 09/11/2022   K 4.3 09/11/2022   CL 101 09/11/2022   CREATININE 1.05 09/11/2022   BUN 22 09/11/2022   CO2 29 09/11/2022   TSH 2.704 10/07/2021   INR 1.6 (H) 04/27/2022   HGBA1C 5.7 (H) 10/08/2021    US BREAST LTD UNI RIGHT INC AXILLA  Result Date: 04/23/2022 CLINICAL DATA:  81 year old presenting with burning pain in the RIGHT breast. In May, 2023 she had a melanoma removed from the RIGHT breast and developed cellulitis and a superficial abscess thereafter for  which she underwent IV antibiotic therapy. She presents now with worsening pain. She was given a prescription for Augmentin 3 days ago. Personal history of malignant RIGHT breast lumpectomy in July, 2015 at [facility]. EXAM: ULTRASOUND OF THE RIGHT BREAST COMPARISON:  RIGHT breast ultrasound 05/18/2014 at the time of breast cancer diagnosis. No interval breast imaging at [facility]. She states that she receives her breast imaging at [facility]. FINDINGS: Targeted ultrasound is performed, showing a heterogeneous mixed solid and fluid collection in the superficial tissues at the 11:30 o'clock position 6 cm from the nipple measuring approximately 2.6 x 1.6 x 2.2 cm, demonstrating posterior acoustic enhancement and mild hyperemia on power Doppler flow. There is skin thickening/edema and there is edema in the surrounding breast tissues. IMPRESSION: Approximate 2.6 cm phlegmon in the superficial tissues of the RIGHT breast at the 11:30 o'clock position 6 cm from the nipple. This has not yet liquified into an abscess and is not a drainable collection. RECOMMENDATION: 1. Antibiotic therapy. 2. If symptoms persist, follow-up RIGHT breast ultrasound in 2 weeks after completion of antibiotic therapy. 3. Annual mammography for which the patient currently goes to [facility]. BI-RADS CATEGORY  2: Benign. Electronically Signed   By: Evangeline Dakin M.D.   On: 04/23/2022 13:28    Assessment & Plan:   Problem List Items Addressed This Visit     Acquired thrombophilia ([place])    secondary to chronic atrial fibrillatin. Patient has no signs of bleeding and is advised to notify her specialists prior to any procedure that may required suspension of warfarin      Benign essential HTN    She is at goal on metoprolol and diltiaem.  No changes today       Bronchitis    Refilling cough suppressant today       Cardiomyopathy, dilated, nonischemic (HCC)   Chronic atrial fibrillation (HCC)   Chronic kidney disease, stage 3,  mod decreased GFR (HCC)    Her GFR has been < 60 ml/min since July 2023n.   I have reduced her furosemide dose to 20 mg daily and advised  her monitor weight daily for changes oof 2 lbs overnight or 5 lbs over 7  days.  Repeat BMET in 2 weeks       RESOLVED: Chronic systolic CHF (congestive heart failure) (HCC)    HER EF was 35 % by Dec 2022 ECHO . She is using furosemide       Ill-defined cerebrovascular disease - Primary    Noted on MRI done in 2018 during workup for TIA.        Other Visit Diagnoses     Decreased GFR       Relevant Orders   Basic metabolic panel   Persistent cough           I spent a total of  30  minutes with this patient in a face to face visit on the date of this encounter reviewing the last office visit with me , her  most recent visit with cardiology ,     patient's diet and exercise habits, most  recent ECHO,  labs and imaging studies ,   and post visit ordering of testing and therapeutics.    Follow-up: No follow-ups on file.   Crecencio Mc, MD

## 2022-09-22 DIAGNOSIS — N183 Chronic kidney disease, stage 3 unspecified: Secondary | ICD-10-CM | POA: Insufficient documentation

## 2022-09-22 NOTE — Assessment & Plan Note (Signed)
She is at goal on metoprolol and diltiaem.  No changes today

## 2022-09-22 NOTE — Assessment & Plan Note (Addendum)
Her GFR has been < 60 ml/min since July 2023n.   I have reduced her furosemide dose to 20 mg daily and advised  her monitor weight daily for changes oof 2 lbs overnight or 5 lbs over 7 days.  Repeat BMET in 2 weeks

## 2022-09-22 NOTE — Assessment & Plan Note (Signed)
Refilling cough suppressant today

## 2022-09-22 NOTE — Assessment & Plan Note (Signed)
HER EF was 35 % by Dec 2022 ECHO . She is using furosemide

## 2022-09-24 DIAGNOSIS — Z7901 Long term (current) use of anticoagulants: Secondary | ICD-10-CM | POA: Diagnosis not present

## 2022-10-03 ENCOUNTER — Ambulatory Visit (INDEPENDENT_AMBULATORY_CARE_PROVIDER_SITE_OTHER): Payer: Medicare Other

## 2022-10-03 VITALS — Ht 66.5 in | Wt 176.0 lb

## 2022-10-03 DIAGNOSIS — Z0000 Encounter for general adult medical examination without abnormal findings: Secondary | ICD-10-CM | POA: Diagnosis not present

## 2022-10-03 NOTE — Progress Notes (Cosign Needed Addendum)
Subjective:   Carol [name] is a 81 y.o. female who presents for Medicare Annual (Subsequent) preventive examination.  Review of Systems    No ROS.  Medicare Wellness Virtual Visit.  Visual/audio telehealth visit, UTA vital signs.   See social history for additional risk factors.   Cardiac Risk Factors include: advanced age (>61mn, >>73women);hypertension     Objective:    Today's Vitals   10/03/22 0824  Weight: 176 lb (79.8 kg)  Height: 5' 6.5" (1.689 m)   Body mass index is 27.98 kg/m.     10/03/2022    8:33 AM 04/25/2022   11:13 AM 04/23/2022   10:43 AM 03/05/2022   10:28 AM 03/04/2022    9:20 PM 10/08/2021    4:00 PM 09/20/2021    2:30 PM  Advanced Directives  Does Patient Have a Medical Advance Directive? Yes Yes No  No Yes Yes  Type of [place]    [place]  Does patient want to make changes to medical advance directive? No - Patient declined No - Patient declined    No - Patient declined No - Patient declined  Copy of [place] Chart?  No - copy requested     No - copy requested  Would patient like information on creating a medical advance directive?  No - Patient declined No - Patient declined No - Patient declined       Current Medications (verified) Outpatient Encounter Medications as of 10/03/2022  Medication Sig   acetaminophen (TYLENOL) 500 MG tablet Take 1,000 mg by mouth every 6 (six) hours as needed for moderate pain or headache.   amiodarone (PACERONE) 200 MG tablet Take 200 mg by mouth daily.   b complex vitamins capsule Take 1 capsule by mouth daily.   bisacodyl (DULCOLAX) 5 MG EC tablet Take 5 mg by mouth daily as needed for moderate constipation.   Cholecalciferol (VITAMIN D) 50 MCG (2000 UT) tablet Take 2,000 Units by mouth daily.   diltiazem (CARDIZEM CD) 180 MG 24 hr capsule Take 180 mg by mouth daily.    diphenhydrAMINE (BENADRYL) 25 mg capsule Take 25 mg by mouth every 6 (six) hours as needed for allergies.   docusate sodium (COLACE) 100 MG capsule Take 100 mg by mouth daily as needed for mild constipation.   doxycycline (VIBRAMYCIN) 100 MG capsule Take 100 mg by mouth 2 (two) times daily.   furosemide (LASIX) 40 MG tablet Take 1 tablet (40 mg total) by mouth daily. More if directed by physician   gabapentin (NEURONTIN) 100 MG capsule Take 1-2 capsules (100-200 mg total) by mouth See admin instructions. Patient taking 100 mg in the morning and 200 mg at night.  Home med.   Glucosamine-Chondroit-Vit C-Mn (GLUCOSAMINE 1500 COMPLEX PO) Take 1 tablet by mouth 2 (two) times daily.   Glycerin-Polysorbate 80 (REFRESH DRY EYE THERAPY OP) Place 1 drop into both eyes 2 (two) times daily as needed (dry eyes).   guaiFENesin-codeine (ROBITUSSIN AC) 100-10 MG/5ML syrup Take 10 mLs by mouth 2 (two) times daily as needed for cough.   MAGNESIUM CITRATE PO Take 400 mg by mouth 2 (two) times daily.   Melatonin 5 MG CAPS Take 5 mg by mouth at bedtime as needed (sleep).   metoprolol succinate (TOPROL-XL) 50 MG 24 hr tablet Take 50-100 mg by mouth See admin instructions. Take 100 mg in the morning and 50 mg at  night   Multiple Vitamin (MULTIVITAMIN WITH MINERALS) TABS tablet Take 0.5 tablets by mouth 2 (two) times daily.   mupirocin ointment (BACTROBAN) 2 % Apply 1 Application topically 3 (three) times daily.   Omega-3 Fatty Acids (FISH OIL) 1000 MG CAPS Take 2,000 mg by mouth in the morning, at noon, and at bedtime.   omeprazole (PRILOSEC) 20 MG capsule TAKE 1 CAPSULE DAILY   rosuvastatin (CRESTOR) 5 MG tablet Take 5 mg by mouth 2 (two) times a week. Mondays and Thursdays   Silver (HYDROGEL AG) GEL Apply daily to open wound. Cleanse with Q-tip.   vitamin E 1000 UNIT capsule Take 1,000 Units by mouth 2 (two) times daily.   warfarin (COUMADIN) 4 MG tablet Take 4 mg by mouth See admin instructions. Take 4 mg daily on  Monday, Wednesday and Friday   warfarin (COUMADIN) 5 MG tablet Take 5 mg by mouth 2 (two) times a week. Take 5 mg by mouth daily on Sunday, Tuesday, Thursday and Saturday   [DISCONTINUED] levalbuterol (XOPENEX HFA) 45 MCG/ACT inhaler Inhale 1-2 puffs into the lungs every 4 (four) hours as needed for wheezing.   No facility-administered encounter medications on file as of 10/03/2022.    Allergies (verified) Prednisone, Pulmicort [budesonide], Clonidine, Lotemax [loteprednol etabonate], Morphine and related, Trovan [alatrofloxacin mesylate], Zelnorm [tegaserod maleate], Ancef [cefazolin], and Erythromycin   History: Past Medical History:  Diagnosis Date   A-fib ([place])    Chronic sinusitis    Fibrocystic breast disease    History of pneumonia 1999   Hyperlipidemia    Irritable bowel syndrome    Lichen planus    lymphoma August 2013   Low grade B cell   Mild tricuspid insufficiency Jan 2012   ECHO, [place]   Moderate mitral insufficiency JAN 2012   E[facility], [place]   Past Surgical History:  Procedure Laterality Date   ABDOMINAL HYSTERECTOMY     precancerous cervix,     ABLATION OF DYSRHYTHMIC FOCUS  August 2013   [facility], Dr. Marcello Moores   BREAST SURGERY     bilateral, benign biopsies   CARDIOVERSION N/A 11/23/2020   Procedure: CARDIOVERSION;  Surgeon: Corey Skains, MD;  Location: [facility];  Service: Cardiovascular;  Laterality: N/A;   CARDIOVERSION N/A 01/17/2021   Procedure: CARDIOVERSION;  Surgeon: Corey Skains, MD;  Location: [facility];  Service: Cardiovascular;  Laterality: N/A;   CARDIOVERSION N/A 01/31/2021   Procedure: CARDIOVERSION;  Surgeon: Corey Skains, MD;  Location: [facility];  Service: Cardiovascular;  Laterality: N/A;   CARDIOVERSION N/A 10/09/2021   Procedure: CARDIOVERSION;  Surgeon: Corey Skains, MD;  Location: [facility];  Service: Cardiovascular;  Laterality: N/A;   CARDIOVERSION N/A 01/17/2022   Procedure: CARDIOVERSION;  Surgeon: Corey Skains, MD;   Location: [facility];  Service: Cardiovascular;  Laterality: N/A;   INCISION AND DRAINAGE ABSCESS Right 04/25/2022   Procedure: INCISION AND DRAINAGE ABSCESS;  Surgeon: Ronny Bacon, MD;  Location: [facility];  Service: General;  Laterality: Right;   MAXILLARY SINUS LIFT  1990's   [name]   oophorectomy     squamous cell removal Right    right leg calf area.   TEE WITHOUT CARDIOVERSION N/A 01/31/2021   Procedure: TRANSESOPHAGEAL ECHOCARDIOGRAM (TEE);  Surgeon: Corey Skains, MD;  Location: [facility];  Service: Cardiovascular;  Laterality: N/A;   TEE WITHOUT CARDIOVERSION N/A 10/09/2021   Procedure: TRANSESOPHAGEAL ECHOCARDIOGRAM (TEE);  Surgeon: Corey Skains, MD;  Location: [facility];  Service: Cardiovascular;  Laterality: N/A;  Family History  Problem Relation Age of Onset   Cancer Mother        Breast   Hyperlipidemia Father    Heart disease Father    Hypertension Father    Kidney disease Father    Cancer Maternal Grandfather        colon CA   Diverticulitis Sister    Social History   Socioeconomic History   Marital status: Married    Spouse name: [name]    Number of children: 2   Years of education: Not on file   Highest education level: Not on file  Occupational History   Not on file  Tobacco Use   Smoking status: Never   Smokeless tobacco: Never  Vaping Use   Vaping Use: Not on file  Substance and Sexual Activity   Alcohol use: Yes    Alcohol/week: 4.0 standard drinks of alcohol    Types: 4 Glasses of wine per week    Comment: Occ.   Drug use: No   Sexual activity: Not Currently    Birth control/protection: Post-menopausal  Other Topics Concern   Not on file  Social History Narrative   Lives at home with spouse    Social Determinants of Health   Financial Resource Strain: Low Risk  (10/03/2022)   Overall Financial Resource Strain (CARDIA)    Difficulty of Paying Living Expenses: Not hard at all  Food Insecurity: No Food Insecurity (10/03/2022)    Hunger Vital Sign    Worried About Running Out of Food in the Last Year: Never true    Ran Out of Food in the Last Year: Never true  Transportation Needs: No Transportation Needs (10/03/2022)   PRAPARE - Hydrologist (Medical): No    Lack of Transportation (Non-Medical): No  Physical Activity: Insufficiently Active (09/20/2021)   Exercise Vital Sign    Days of Exercise per Week: 5 days    Minutes of Exercise per Session: 20 min  Stress: No Stress Concern Present (10/03/2022)   [place]    Feeling of Stress : Not at all  Social Connections: Unknown (10/03/2022)   Social Connection and Isolation Panel [NHANES]    Frequency of Communication with Friends and Family: More than three times a week    Frequency of Social Gatherings with Friends and Family: More than three times a week    Attends Religious Services: Not on Advertising copywriter or Organizations: Not on file    Attends Archivist Meetings: Not on file    Marital Status: Married    Tobacco Counseling Counseling given: Not Answered   Clinical Intake:  Pre-visit preparation completed: Yes        Diabetes: No  How often do you need to have someone help you when you read instructions, pamphlets, or other written materials from your doctor or pharmacy?: 1 - Never    Interpreter Needed?: No      Activities of Daily Living    10/03/2022    8:36 AM 04/23/2022    6:55 PM  In your present state of health, do you have any difficulty performing the following activities:  Hearing? 0   Vision? 0   Difficulty concentrating or making decisions? 0   Walking or climbing stairs? 0   Dressing or bathing? 0   Doing errands, shopping? 0 0  Preparing Food and eating ? N   Using the Toilet?  N   In the past six months, have you accidently leaked urine? N   Do you have problems with loss of bowel control? N    Managing your Medications? N   Managing your Finances? N   Housekeeping or managing your Housekeeping? N     Patient Care Team: Crecencio Mc, MD as PCP - General (Internal Medicine) Corey Skains, MD as Referring Physician (Internal Medicine)  Indicate any recent Medical Services you may have received from other than [facility] providers in the past year (date may be approximate).     Assessment:   This is a routine wellness examination for [place].  I connected with  [name] on 10/03/22 by a audio enabled telemedicine application and verified that I am speaking with the correct person using two identifiers.  Patient Location: Home  Provider Location: Office/Clinic  I discussed the limitations of evaluation and management by telemedicine. The patient expressed understanding and agreed to proceed.   Hearing/Vision screen Hearing Screening - Comments:: Patient is able to hear conversational tones without difficulty. No issues reported. Vision Screening - Comments:: Followed by [facility]  Wear lenses when reading Cataract extraction, bilateral They have regular follow up with the ophthalmologist    Dietary issues and exercise activities discussed: Current Exercise Habits: Home exercise routine, Type of exercise: stretching;walking, Intensity: Mild Healthy diet Good water intake   Goals Addressed               This Visit's Progress     Patient Stated     Weigh daily (pt-stated)         Depression Screen    10/03/2022    8:27 AM 09/20/2022    3:17 PM 05/09/2022    8:46 AM 05/08/2022    3:59 PM 03/04/2022   10:53 AM 10/15/2021    9:53 AM 09/20/2021    2:28 PM  PHQ 2/9 Scores  PHQ - 2 Score 1 1 0 0 0 0 0  PHQ- 9 Score     2      Fall Risk    10/03/2022    8:36 AM 09/20/2022    3:18 PM 06/11/2022    4:44 PM 05/16/2022    8:51 AM 05/09/2022    8:46 AM  Fall Risk   Falls in the past year? 0 0 0 0 0  Number falls in past yr: 0       Injury with Fall? 0      Risk for fall due to : No Fall Risks No Fall Risks No Fall Risks  No Fall Risks  Follow up Falls evaluation completed;Falls prevention discussed Falls evaluation completed Falls evaluation completed  Falls evaluation completed    FALL RISK PREVENTION PERTAINING TO THE HOME: Home free of loose throw rugs in walkways, pet beds, electrical cords, etc? Yes  Adequate lighting in your home to reduce risk of falls? Yes   ASSISTIVE DEVICES UTILIZED TO PREVENT FALLS: Life alert? No  Use of a cane, walker or w/c? No   TIMED UP AND GO: Was the test performed? No .   Cognitive Function:    12/26/2016    9:31 AM 11/27/2015   10:51 AM  MMSE - Mini Mental State Exam  Orientation to time 5 5  Orientation to Place 5 5  Registration 3 3  Attention/ Calculation 4 5  Recall 2 3  Language- name 2 objects 2 2  Language- repeat 1 1  Language- follow 3 step  command 3 3  Language- read & follow direction 1 1  Write a sentence 1 1  Copy design 1 1  Total score 28 30        10/03/2022    8:44 AM 09/20/2021    2:33 PM 08/15/2020   10:53 AM 04/16/2019   10:58 AM 01/27/2018   12:12 PM  6CIT Screen  What Year? 0 points 0 points 0 points 0 points 0 points  What month? 0 points 0 points 0 points 0 points 0 points  What time? 0 points 0 points 0 points 0 points 0 points  Count back from 20 0 points 0 points  0 points 0 points  Months in reverse 0 points 0 points  0 points 0 points  Repeat phrase 0 points 0 points   0 points  Total Score 0 points 0 points   0 points    Immunizations Immunization History  Administered Date(s) Administered   Fluad Quad(high Dose 65+) 10/11/2020   Influenza, High Dose Seasonal PF 09/26/2016   Influenza, Seasonal, Injecte, Preservative Fre 11/24/2012   Influenza,inj,Quad PF,6+ Mos 08/24/2013, 09/15/2014, 08/30/2015, 09/15/2017, 09/09/2018   Influenza-Unspecified 08/04/2013, 08/07/2014, 09/19/2015, 08/10/2019   PFIZER Comirnaty(Gray  Top)Covid-19 Tri-Sucrose Vaccine 11/30/2019, 12/21/2019, 08/23/2020   PFIZER(Purple Top)SARS-COV-2 Vaccination 11/30/2019, 12/21/2019, 08/23/2020   Pneumococcal Conjugate-13 11/27/2015   Pneumococcal Polysaccharide-23 11/05/2013, 04/05/2020   Tdap 11/28/2011   Zoster, Live 05/17/2008    TDAP status: Due, Education has been provided regarding the importance of this vaccine. Advised may receive this vaccine at local pharmacy or Health Dept. Aware to provide a copy of the vaccination record if obtained from local pharmacy or Health Dept. Verbalized acceptance and understanding.  Screening Tests Health Maintenance  Topic Date Due   DTaP/Tdap/Td (2 - Td or Tdap) 11/27/2021   COVID-19 Vaccine (7 - 2023-24 season) 10/06/2022 (Originally 07/05/2022)   Zoster Vaccines- Shingrix (1 of 2) 12/21/2022 (Originally 09/05/1960)   INFLUENZA VACCINE  02/02/2023 (Originally 06/04/2022)   COLONOSCOPY (Pts 45-20yr Insurance coverage will need to be confirmed)  04/05/2023 (Originally 02/12/2012)   [place]  10/04/2023   Pneumonia Vaccine 81 Years old  Completed   DEXA SCAN  Completed   HPV VACCINES  Aged Out   Health Maintenance Health Maintenance Due  Topic Date Due   DTaP/Tdap/Td (2 - Td or Tdap) 11/27/2021   Colonoscopy- deferred per patient preference.   Lung Cancer Screening: (Low Dose CT Chest recommended if Age 82-80years, 30 pack-year currently smoking OR have quit w/in 15years.) does not qualify.   Hepatitis C Screening: does not qualify.  Vision Screening: Recommended annual ophthalmology exams for early detection of glaucoma and other disorders of the eye.  Dental Screening: Recommended annual dental exams for proper oral hygiene.  Community Resource Referral / Chronic Care Management: CRR required this visit?  No   CCM required this visit?  No      Plan:     I have personally reviewed and noted the following in the patient's chart:   Medical and social  history Use of alcohol, tobacco or illicit drugs  Current medications and supplements including opioid prescriptions. Patient is not currently taking opioid prescriptions. Functional ability and status Nutritional status Physical activity Advanced directives List of other physicians Hospitalizations, surgeries, and ER visits in previous 12 months Vitals Screenings to include cognitive, depression, and falls Referrals and appointments  In addition, I have reviewed and discussed with patient certain preventive protocols, quality metrics, and best practice recommendations. A  written personalized care plan for preventive services as well as general preventive health recommendations were provided to patient.     [place], LPN   [id number]    I have reviewed the above information and agree with above.   [name], MD

## 2022-10-03 NOTE — Patient Instructions (Addendum)
Ms. Carol [name] , Thank you for taking time to come for your Medicare Wellness Visit. I appreciate your ongoing commitment to your health goals. Please review the following plan we discussed and let me know if I can assist you in the future.   These are the goals we discussed:  Goals       Patient Stated     Weigh daily (pt-stated)        This is a list of the screening recommended for you and due dates:  Health Maintenance  Topic Date Due   DTaP/Tdap/Td vaccine (2 - Td or Tdap) 11/27/2021   COVID-19 Vaccine (7 - 2023-24 season) 10/06/2022*   Zoster (Shingles) Vaccine (1 of 2) 12/21/2022*   Flu Shot  02/02/2023*   Colon Cancer Screening  04/05/2023*   Medicare Annual Wellness Visit  10/04/2023   Pneumonia Vaccine  Completed   DEXA scan (bone density measurement)  Completed   HPV Vaccine  Aged Out  *Topic was postponed. The date shown is not the original due date.    Advanced directives: End of life planning; Advance aging; Advanced directives discussed.  Copy of current HCPOA/Living Will requested.    Conditions/risks identified: none new.  Next appointment: Follow up in one year for your annual wellness visit.   Preventive Care 31 Years and Older, Female Preventive care refers to lifestyle choices and visits with your health care provider that can promote health and wellness. What does preventive care include? A yearly physical exam. This is also called an annual well check. Dental exams once or twice a year. Routine eye exams. Ask your health care provider how often you should have your eyes checked. Personal lifestyle choices, including: Daily care of your teeth and gums. Regular physical activity. Eating a healthy diet. Avoiding tobacco and drug use. Limiting alcohol use. Practicing safe sex. Taking low-dose aspirin every day. Taking vitamin and mineral supplements as recommended by your health care provider. What happens during an annual well check? The services  and screenings done by your health care provider during your annual well check will depend on your age, overall health, lifestyle risk factors, and family history of disease. Counseling  Your health care provider may ask you questions about your: Alcohol use. Tobacco use. Drug use. Emotional well-being. Home and relationship well-being. Sexual activity. Eating habits. History of falls. Memory and ability to understand (cognition). Work and work Statistician. Reproductive health. Screening  You may have the following tests or measurements: Height, weight, and BMI. Blood pressure. Lipid and cholesterol levels. These may be checked every 5 years, or more frequently if you are over 40 years old. Skin check. Lung cancer screening. You may have this screening every year starting at age 75 if you have a 30-pack-year history of smoking and currently smoke or have quit within the past 15 years. Fecal occult blood test (FOBT) of the stool. You may have this test every year starting at age 22. Flexible sigmoidoscopy or colonoscopy. You may have a sigmoidoscopy every 5 years or a colonoscopy every 10 years starting at age 3. Hepatitis C blood test. Hepatitis B blood test. Sexually transmitted disease (STD) testing. Diabetes screening. This is done by checking your blood sugar (glucose) after you have not eaten for a while (fasting). You may have this done every 1-3 years. Bone density scan. This is done to screen for osteoporosis. You may have this done starting at age 24. Mammogram. This may be done every 1-2 years. Talk to your health  care provider about how often you should have regular mammograms. Talk with your health care provider about your test results, treatment options, and if necessary, the need for more tests. Vaccines  Your health care provider may recommend certain vaccines, such as: Influenza vaccine. This is recommended every year. Tetanus, diphtheria, and acellular pertussis  (Tdap, Td) vaccine. You may need a Td booster every 10 years. Zoster vaccine. You may need this after age 62. Pneumococcal 13-valent conjugate (PCV13) vaccine. One dose is recommended after age 36. Pneumococcal polysaccharide (PPSV23) vaccine. One dose is recommended after age 60. Talk to your health care provider about which screenings and vaccines you need and how often you need them. This information is not intended to replace advice given to you by your health care provider. Make sure you discuss any questions you have with your health care provider. Document Released: 11/17/2015 Document Revised: 07/10/2016 Document Reviewed: 08/22/2015 Elsevier Interactive Patient Education  2017 [place] Prevention in the Home Falls can cause injuries. They can happen to people of all ages. There are many things you can do to make your home safe and to help prevent falls. What can I do on the outside of my home? Regularly fix the edges of walkways and driveways and fix any cracks. Remove anything that might make you trip as you walk through a door, such as a raised step or threshold. Trim any bushes or trees on the path to your home. Use bright outdoor lighting. Clear any walking paths of anything that might make someone trip, such as rocks or tools. Regularly check to see if handrails are loose or broken. Make sure that both sides of any steps have handrails. Any raised decks and porches should have guardrails on the edges. Have any leaves, snow, or ice cleared regularly. Use sand or salt on walking paths during winter. Clean up any spills in your garage right away. This includes oil or grease spills. What can I do in the bathroom? Use night lights. Install grab bars by the toilet and in the tub and shower. Do not use towel bars as grab bars. Use non-skid mats or decals in the tub or shower. If you need to sit down in the shower, use a plastic, non-slip stool. Keep the floor dry. Clean  up any water that spills on the floor as soon as it happens. Remove soap buildup in the tub or shower regularly. Attach bath mats securely with double-sided non-slip rug tape. Do not have throw rugs and other things on the floor that can make you trip. What can I do in the bedroom? Use night lights. Make sure that you have a light by your bed that is easy to reach. Do not use any sheets or blankets that are too big for your bed. They should not hang down onto the floor. Have a firm chair that has side arms. You can use this for support while you get dressed. Do not have throw rugs and other things on the floor that can make you trip. What can I do in the kitchen? Clean up any spills right away. Avoid walking on wet floors. Keep items that you use a lot in easy-to-reach places. If you need to reach something above you, use a strong step stool that has a grab bar. Keep electrical cords out of the way. Do not use floor polish or wax that makes floors slippery. If you must use wax, use non-skid floor wax. Do not have throw  rugs and other things on the floor that can make you trip. What can I do with my stairs? Do not leave any items on the stairs. Make sure that there are handrails on both sides of the stairs and use them. Fix handrails that are broken or loose. Make sure that handrails are as long as the stairways. Check any carpeting to make sure that it is firmly attached to the stairs. Fix any carpet that is loose or worn. Avoid having throw rugs at the top or bottom of the stairs. If you do have throw rugs, attach them to the floor with carpet tape. Make sure that you have a light switch at the top of the stairs and the bottom of the stairs. If you do not have them, ask someone to add them for you. What else can I do to help prevent falls? Wear shoes that: Do not have high heels. Have rubber bottoms. Are comfortable and fit you well. Are closed at the toe. Do not wear sandals. If you  use a stepladder: Make sure that it is fully opened. Do not climb a closed stepladder. Make sure that both sides of the stepladder are locked into place. Ask someone to hold it for you, if possible. Clearly mark and make sure that you can see: Any grab bars or handrails. First and last steps. Where the edge of each step is. Use tools that help you move around (mobility aids) if they are needed. These include: Canes. Walkers. Scooters. Crutches. Turn on the lights when you go into a dark area. Replace any light bulbs as soon as they burn out. Set up your furniture so you have a clear path. Avoid moving your furniture around. If any of your floors are uneven, fix them. If there are any pets around you, be aware of where they are. Review your medicines with your doctor. Some medicines can make you feel dizzy. This can increase your chance of falling. Ask your doctor what other things that you can do to help prevent falls. This information is not intended to replace advice given to you by your health care provider. Make sure you discuss any questions you have with your health care provider. Document Released: 08/17/2009 Document Revised: 03/28/2016 Document Reviewed: 11/25/2014 Elsevier Interactive Patient Education  2017 [employer].

## 2022-10-21 DIAGNOSIS — Z7901 Long term (current) use of anticoagulants: Secondary | ICD-10-CM | POA: Diagnosis not present

## 2022-10-30 ENCOUNTER — Other Ambulatory Visit: Payer: Self-pay

## 2022-10-30 MED ORDER — OMEPRAZOLE 20 MG PO CPDR: 20.0000 mg | DELAYED_RELEASE_CAPSULE | Freq: Every day | ORAL | 3 refills | Status: DC

## 2022-11-06 DIAGNOSIS — Z7901 Long term (current) use of anticoagulants: Secondary | ICD-10-CM | POA: Diagnosis not present

## 2022-11-08 ENCOUNTER — Encounter: Payer: Self-pay | Admitting: Internal Medicine

## 2022-11-08 ENCOUNTER — Ambulatory Visit (INDEPENDENT_AMBULATORY_CARE_PROVIDER_SITE_OTHER): Payer: Medicare HMO | Admitting: Internal Medicine

## 2022-11-08 VITALS — BP 134/82 | HR 69 | Temp 97.7°F | Ht 66.5 in | Wt 176.6 lb

## 2022-11-08 DIAGNOSIS — N1831 Chronic kidney disease, stage 3a: Secondary | ICD-10-CM | POA: Diagnosis not present

## 2022-11-08 DIAGNOSIS — E041 Nontoxic single thyroid nodule: Secondary | ICD-10-CM

## 2022-11-08 DIAGNOSIS — E785 Hyperlipidemia, unspecified: Secondary | ICD-10-CM | POA: Diagnosis not present

## 2022-11-08 DIAGNOSIS — I420 Dilated cardiomyopathy: Secondary | ICD-10-CM

## 2022-11-08 DIAGNOSIS — D6869 Other thrombophilia: Secondary | ICD-10-CM | POA: Diagnosis not present

## 2022-11-08 DIAGNOSIS — R053 Chronic cough: Secondary | ICD-10-CM

## 2022-11-08 DIAGNOSIS — I4891 Unspecified atrial fibrillation: Secondary | ICD-10-CM

## 2022-11-08 DIAGNOSIS — Z79899 Other long term (current) drug therapy: Secondary | ICD-10-CM | POA: Diagnosis not present

## 2022-11-08 DIAGNOSIS — Z23 Encounter for immunization: Secondary | ICD-10-CM | POA: Diagnosis not present

## 2022-11-08 DIAGNOSIS — I10 Essential (primary) hypertension: Secondary | ICD-10-CM | POA: Diagnosis not present

## 2022-11-08 LAB — COMPREHENSIVE METABOLIC PANEL
ALT: 22 U/L (ref 0–35)
AST: 23 U/L (ref 0–37)
Albumin: 4 g/dL (ref 3.5–5.2)
Alkaline Phosphatase: 56 U/L (ref 39–117)
BUN: 20 mg/dL (ref 6–23)
CO2: 29 mEq/L (ref 19–32)
Calcium: 9.1 mg/dL (ref 8.4–10.5)
Chloride: 102 mEq/L (ref 96–112)
Creatinine, Ser: 0.82 mg/dL (ref 0.40–1.20)
GFR: 66.73 mL/min (ref >60.00)
Glucose, Bld: 97 mg/dL (ref 70–99)
Potassium: 3.8 mEq/L (ref 3.5–5.1)
Sodium: 140 mEq/L (ref 135–145)
Total Bilirubin: 0.6 mg/dL (ref 0.2–1.2)
Total Protein: 6.3 g/dL (ref 6.0–8.3)

## 2022-11-08 LAB — CBC WITH DIFFERENTIAL/PLATELET
Basophils Absolute: 0 10*3/uL (ref 0.0–0.1)
Basophils Relative: 0.4 % (ref 0.0–3.0)
Eosinophils Absolute: 0.1 10*3/uL (ref 0.0–0.7)
Eosinophils Relative: 1 % (ref 0.0–5.0)
HCT: 38.2 % (ref 36.0–46.0)
Hemoglobin: 12.9 g/dL (ref 12.0–15.0)
Lymphocytes Relative: 24.6 % (ref 12.0–46.0)
Lymphs Abs: 2.1 10*3/uL (ref 0.7–4.0)
MCHC: 33.8 g/dL (ref 30.0–36.0)
MCV: 97.2 fl (ref 78.0–100.0)
Monocytes Absolute: 0.7 10*3/uL (ref 0.1–1.0)
Monocytes Relative: 8.7 % (ref 3.0–12.0)
Neutro Abs: 5.6 10*3/uL (ref 1.4–7.7)
Neutrophils Relative %: 65.3 % (ref 43.0–77.0)
Platelets: 310 10*3/uL (ref 150.0–400.0)
RBC: 3.93 Mil/uL (ref 3.87–5.11)
RDW: 14.8 % (ref 11.5–15.5)
WBC: 8.6 10*3/uL (ref 4.0–10.5)

## 2022-11-08 LAB — LIPID PANEL
Cholesterol: 205 mg/dL — ABNORMAL HIGH (ref 0–200)
HDL: 59.1 mg/dL (ref >39.00)
LDL Cholesterol: 126 mg/dL — ABNORMAL HIGH (ref 0–99)
NonHDL: 145.84
Total CHOL/HDL Ratio: 3
Triglycerides: 100 mg/dL (ref 0.0–149.0)
VLDL: 20 mg/dL (ref 0.0–40.0)

## 2022-11-08 LAB — TSH: TSH: 2.35 u[IU]/mL (ref 0.35–5.50)

## 2022-11-08 LAB — LDL CHOLESTEROL, DIRECT: Direct LDL: 124 mg/dL

## 2022-11-08 MED ORDER — GABAPENTIN 100 MG PO CAPS: 200.0000 mg | ORAL_CAPSULE | Freq: Two times a day (BID) | ORAL | 1 refills | Status: AC

## 2022-11-08 MED ORDER — GUAIFENESIN-CODEINE 100-10 MG/5ML PO SYRP: 10.0000 mL | ORAL_SOLUTION | Freq: Two times a day (BID) | ORAL | 0 refills | Status: DC | PRN

## 2022-11-08 NOTE — Assessment & Plan Note (Signed)
Her previously noted decline in GFR was apparently aggravated by chronic diuretic use but has resolved.   (still using 40 mg lasix daily in divided doses).  Renal ultrasound cancelled,  ,; nephrology referral deferred   She is avoiding NSAIDS due to chronic anticoagulation

## 2022-11-08 NOTE — Patient Instructions (Signed)
I am referring you for a renal ultrasound and to [facility]  for evaluation of your kidney disease

## 2022-11-08 NOTE — Progress Notes (Unsigned)
Subjective:  Patient ID: Carol [name], female    DOB: 1941/02/04  Age: 82 y.o. MRN: [id number]  CC: The primary encounter diagnosis was Benign essential HTN. Diagnoses of Thyroid nodule, Hyperlipidemia, unspecified hyperlipidemia type, and Long-term use of high-risk medication were also pertinent to this visit.   HPI Carol [name] presents for  Chief Complaint  Patient presents with   Medical Management of Chronic Issues    6 month follow up    1) sinus congestion and drainage , chronic .  Drainage is purulent , using nasal irrigation,  no sinus pain or fevers.  No body aches . Some laryngitis which is new.  Wants  flu vaccine  2) CKD new diagnosis .  GFR has been < 60 ml/min since July 2023:  has  increased her  furosemide  to 20 mg twice daily due to fluid retention.   3) Atrial fibrillation:  s/p ablation Dec 2022  Taking diltiazem CD 180 mg daily to manage atrial fib . Has not had to use any short acting.  Follow up has been managed by    [facility] Albertine Patricia. Taking amiodarone 200 g daily,  and metroprolol XL 100 mg qam ,  50 mg qpm ly (  4) acquired thrombophilia ; taking warfarin, managed by coumadin clinic at [facility].     5) cardiomyopathy  Has ef 35 to 40% by last ECHO  done DEC 2022 during ablation.     Outpatient Medications Prior to Visit  Medication Sig Dispense Refill   acetaminophen (TYLENOL) 500 MG tablet Take 1,000 mg by mouth every 6 (six) hours as needed for moderate pain or headache.     amiodarone (PACERONE) 200 MG tablet Take 200 mg by mouth daily.     b complex vitamins capsule Take 1 capsule by mouth daily.     bisacodyl (DULCOLAX) 5 MG EC tablet Take 5 mg by mouth daily as needed for moderate constipation.     Cholecalciferol (VITAMIN D) 50 MCG (2000 UT) tablet Take 2,000 Units by mouth daily.     diltiazem (CARDIZEM CD) 180 MG 24 hr capsule Take 180 mg by mouth daily.     diphenhydrAMINE (BENADRYL) 25 mg capsule Take 25 mg by mouth  every 6 (six) hours as needed for allergies.     docusate sodium (COLACE) 100 MG capsule Take 100 mg by mouth daily as needed for mild constipation.     furosemide (LASIX) 20 MG tablet Take 20 mg by mouth 2 (two) times daily.     gabapentin (NEURONTIN) 100 MG capsule Take 1-2 capsules (100-200 mg total) by mouth See admin instructions. Patient taking 100 mg in the morning and 200 mg at night.  Home med. 90 capsule 1   Glucosamine-Chondroit-Vit C-Mn (GLUCOSAMINE 1500 COMPLEX PO) Take 1 tablet by mouth 2 (two) times daily.     Glycerin-Polysorbate 80 (REFRESH DRY EYE THERAPY OP) Place 1 drop into both eyes 2 (two) times daily as needed (dry eyes).     MAGNESIUM CITRATE PO Take 400 mg by mouth 2 (two) times daily.     Melatonin 5 MG CAPS Take 5 mg by mouth at bedtime as needed (sleep).     metoprolol succinate (TOPROL-XL) 50 MG 24 hr tablet Take 50-100 mg by mouth See admin instructions. Take 100 mg in the morning and 50 mg at night     Multiple Vitamin (MULTIVITAMIN WITH MINERALS) TABS tablet Take 0.5 tablets by mouth 2 (two) times daily.  mupirocin ointment (BACTROBAN) 2 % Apply 1 Application topically 3 (three) times daily.     Omega-3 Fatty Acids (FISH OIL) 1000 MG CAPS Take 2,000 mg by mouth in the morning, at noon, and at bedtime.     omeprazole (PRILOSEC) 20 MG capsule Take 1 capsule (20 mg total) by mouth daily. 90 capsule 3   rosuvastatin (CRESTOR) 5 MG tablet Take 5 mg by mouth 2 (two) times a week. Mondays and Thursdays     Silver (HYDROGEL AG) GEL Apply daily to open wound. Cleanse with Q-tip. 15 mL 0   vitamin E 1000 UNIT capsule Take 1,000 Units by mouth 2 (two) times daily.     warfarin (COUMADIN) 4 MG tablet Take 4 mg by mouth See admin instructions. Take 4 mg daily on Monday, Wednesday and Friday     warfarin (COUMADIN) 5 MG tablet Take 5 mg by mouth 2 (two) times a week. Take 5 mg by mouth daily on Sunday, Tuesday, Thursday and Saturday     doxycycline (VIBRAMYCIN) 100 MG capsule  Take 100 mg by mouth 2 (two) times daily. (Patient not taking: Reported on 11/08/2022)     furosemide (LASIX) 40 MG tablet Take 1 tablet (40 mg total) by mouth daily. More if directed by physician (Patient not taking: Reported on 11/08/2022) 90 tablet 1   guaiFENesin-codeine (ROBITUSSIN AC) 100-10 MG/5ML syrup Take 10 mLs by mouth 2 (two) times daily as needed for cough. (Patient not taking: Reported on 11/08/2022) 237 mL 0   No facility-administered medications prior to visit.    Review of Systems;  Patient denies headache, fevers, malaise, unintentional weight loss, skin rash, eye pain, sinus congestion and sinus pain, sore throat, dysphagia,  hemoptysis , cough, dyspnea, wheezing, chest pain, palpitations, orthopnea, edema, abdominal pain, nausea, melena, diarrhea, constipation, flank pain, dysuria, hematuria, urinary  Frequency, nocturia, numbness, tingling, seizures,  Focal weakness, Loss of consciousness,  Tremor, insomnia, depression, anxiety, and suicidal ideation.      Objective:  BP 134/82   Pulse 69   Temp 97.7 F (36.5 C) (Oral)   Ht 5' 6.5" (1.689 m)   Wt 176 lb 9.6 oz (80.1 kg)   SpO2 95%   BMI 28.08 kg/m   BP Readings from Last 3 Encounters:  11/08/22 134/82  09/20/22 120/84  07/30/22 128/77    Wt Readings from Last 3 Encounters:  11/08/22 176 lb 9.6 oz (80.1 kg)  10/03/22 176 lb (79.8 kg)  09/20/22 176 lb (79.8 kg)    Physical Exam  Lab Results  Component Value Date   HGBA1C 5.7 (H) 10/08/2021   HGBA1C 5.7 (H) 10/07/2021   HGBA1C 5.7 05/27/2016    Lab Results  Component Value Date   CREATININE 1.05 09/11/2022   CREATININE 1.03 06/07/2022   CREATININE 1.06 05/08/2022    Lab Results  Component Value Date   WBC 7.6 04/27/2022   HGB 12.1 04/27/2022   HCT 36.0 04/27/2022   PLT 311 04/27/2022   GLUCOSE 105 (H) 09/11/2022   CHOL 203 (H) 06/07/2022   TRIG 106.0 06/07/2022   HDL 59.20 06/07/2022   LDLDIRECT 156.0 04/05/2020   LDLCALC 123 (H) 06/07/2022    ALT 20 05/08/2022   AST 30 05/08/2022   NA 138 09/11/2022   K 4.3 09/11/2022   CL 101 09/11/2022   CREATININE 1.05 09/11/2022   BUN 22 09/11/2022   CO2 29 09/11/2022   TSH 2.704 10/07/2021   INR 1.6 (H) 04/27/2022   HGBA1C 5.7 (H) 10/08/2021  US BREAST LTD UNI RIGHT INC AXILLA  Result Date: 04/23/2022 CLINICAL DATA:  82 year old presenting with burning pain in the RIGHT breast. 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 She presents now with worsening pain. She was given a prescription for Augmentin 3 days ago. Personal history of malignant RIGHT breast lumpectomy in July, 2015 at [facility]. EXAM: ULTRASOUND OF THE RIGHT BREAST COMPARISON:  RIGHT breast ultrasound 05/18/2014 at the time of breast cancer diagnosis. No interval breast imaging at [facility]. She states that she receives her breast imaging at [facility]. FINDINGS: Targeted ultrasound is performed, showing a heterogeneous mixed solid and fluid collection in the superficial tissues at the 11:30 o'clock position 6 cm from the nipple measuring approximately 2.6 x 1.6 x 2.2 cm, demonstrating posterior acoustic enhancement and mild hyperemia on power Doppler flow. There is skin thickening/edema and there is edema in the surrounding breast tissues. IMPRESSION: Approximate 2.6 cm phlegmon in the superficial tissues of the RIGHT breast at the 11:30 o'clock position 6 cm from the nipple. This has not yet liquified into an abscess and is not a drainable collection. RECOMMENDATION: 1. Antibiotic therapy. 2. If symptoms persist, follow-up RIGHT breast ultrasound in 2 weeks after completion of antibiotic therapy. 3. Annual mammography for which the patient currently goes to [facility]. BI-RADS CATEGORY  2: Benign. Electronically Signed   By: Evangeline Dakin M.D.   On: 04/23/2022 13:28    Assessment & Plan:  .Benign  essential HTN  Thyroid nodule  Hyperlipidemia, unspecified hyperlipidemia type  Long-term use of high-risk medication     I provided 30 minutes of face-to-face time during this encounter reviewing patient's last visit with me, patient's  most recent visit with cardiology,  nephrology,  and neurology,  recent surgical and non surgical procedures, previous  labs and imaging studies, counseling on currently addressed issues,  and post visit ordering to diagnostics and therapeutics .   Follow-up: No follow-ups on file.   Crecencio Mc, MD

## 2022-11-10 ENCOUNTER — Encounter: Payer: Self-pay | Admitting: Internal Medicine

## 2022-11-10 LAB — PTH, INTACT AND CALCIUM
Calcium: 8.7 mg/dL (ref 8.6–10.4)
PTH: 24 pg/mL (ref 16–77)

## 2022-11-10 NOTE — Assessment & Plan Note (Signed)
secondary to chronic atrial fibrillatin. Patient has no signs of bleeding and is advised to notify her specialists prior to any procedure that may required suspension of warfarin

## 2022-11-10 NOTE — Assessment & Plan Note (Signed)
She requires daily use of furosemide to manage edema

## 2022-11-10 NOTE — Assessment & Plan Note (Signed)
S/p cardioversion and ablations.  Currently maintained in SR with amiodarone and diltiazem and metoprolol

## 2022-11-10 NOTE — Addendum Note (Signed)
Addended by: Crecencio Mc on: 11/10/2022 01:10 PM   Modules accepted: Orders

## 2022-11-20 DIAGNOSIS — R791 Abnormal coagulation profile: Secondary | ICD-10-CM | POA: Diagnosis not present

## 2022-11-29 ENCOUNTER — Ambulatory Visit: Payer: Medicare HMO

## 2022-12-04 DIAGNOSIS — R791 Abnormal coagulation profile: Secondary | ICD-10-CM | POA: Diagnosis not present

## 2022-12-11 DIAGNOSIS — I480 Paroxysmal atrial fibrillation: Secondary | ICD-10-CM | POA: Diagnosis not present

## 2022-12-16 ENCOUNTER — Ambulatory Visit (INDEPENDENT_AMBULATORY_CARE_PROVIDER_SITE_OTHER): Payer: Medicare HMO | Admitting: Family

## 2022-12-16 ENCOUNTER — Encounter: Payer: Self-pay | Admitting: Family

## 2022-12-16 ENCOUNTER — Telehealth: Payer: Self-pay | Admitting: Family

## 2022-12-16 VITALS — BP 118/80 | HR 85 | Temp 97.4°F | Ht 66.0 in | Wt 178.9 lb

## 2022-12-16 DIAGNOSIS — R051 Acute cough: Secondary | ICD-10-CM

## 2022-12-16 DIAGNOSIS — J40 Bronchitis, not specified as acute or chronic: Secondary | ICD-10-CM

## 2022-12-16 LAB — POCT INFLUENZA A/B
Influenza A, POC: NEGATIVE
Influenza B, POC: NEGATIVE

## 2022-12-16 LAB — POC COVID19 BINAXNOW: SARS Coronavirus 2 Ag: NEGATIVE

## 2022-12-16 MED ORDER — DOXYCYCLINE HYCLATE 100 MG PO TABS: 100.0000 mg | ORAL_TABLET | Freq: Two times a day (BID) | ORAL | 0 refills | Status: AC

## 2022-12-16 NOTE — Assessment & Plan Note (Addendum)
No acute respiratory distress.  No adventitious lung sounds or lower extremity edema.  Weight is up 2 pounds.  Patient has self increased Lasix 20 mg from twice daily to 3 times daily, she will resume prior dosing and continue daily weights. Based on duration of 4 days, suspect viral URI.  Negative flu and COVID today.  Patient will try Mucinex first and if not effective,I  have sent in doxycycline for her to start.  She is aware that she will need PT/INR checked midway through doxycycline course and will make [place] aware.

## 2022-12-16 NOTE — Progress Notes (Signed)
Assessment & Plan:  Acute cough -     POC COVID-19 BinaxNow -     POCT Influenza A/B -     Doxycycline Hyclate; Take 1 tablet (100 mg total) by mouth 2 (two) times daily for 7 days.  Dispense: 14 tablet; Refill: 0  Bronchitis Assessment & Plan: No acute respiratory distress.  No adventitious lung sounds or lower extremity edema.  Weight is up 2 pounds.  Patient has self increased Lasix 20 mg from twice daily to 3 times daily, she will resume prior dosing and continue daily weights. Based on duration of 4 days, suspect viral URI.  Negative flu and COVID today.  Patient will try Mucinex first and if not effective,I  have sent in doxycycline for her to start.  She is aware that she will need PT/INR checked midway through doxycycline course and will make [place] aware.      Return precautions given.   Risks, benefits, and alternatives of the medications and treatment plan prescribed today were discussed, and patient expressed understanding.   Education regarding symptom management and diagnosis given to patient on AVS either electronically or printed.  No follow-ups on file.  Mable Paris, FNP  Subjective:    Patient ID: Carol [name], female    DOB: 1941-10-29, 82 y.o.   MRN: [id number]  CC: Carol [name] is a 82 y.o. female who presents today for an acute visit.    HPI: Complains of productive thick yellow cough x 4 days, worsening  Occassional wheeze which resolves after cough  She uses prn codeine cough syrup which was prescribed 09/2023  Never smoker  She is taking lasix 22m BID and past couple of days, she took one extra pill daily. She has felt SOB at time this week while coughing.   Leg swelling is at baseline. She is wearing support stockings.   No CP, orthopnea.  She has INR check in 2 days         History of recurrent sinusitis, atrial fibrillation, cardiomyopathy, GERD, bronchitis, reactive airway disease, lymphoma,  CKD   Allergies: Prednisone, Pulmicort [budesonide], Clonidine, Lotemax [loteprednol etabonate], Morphine and related, Trovan [alatrofloxacin mesylate], Zelnorm [tegaserod maleate], Ancef [cefazolin], and Erythromycin Current Outpatient Medications on File Prior to Visit  Medication Sig Dispense Refill   acetaminophen (TYLENOL) 500 MG tablet Take 1,000 mg by mouth every 6 (six) hours as needed for moderate pain or headache.     amiodarone (PACERONE) 200 MG tablet Take 200 mg by mouth daily.     b complex vitamins capsule Take 1 capsule by mouth daily.     bisacodyl (DULCOLAX) 5 MG EC tablet Take 5 mg by mouth daily as needed for moderate constipation.     Cholecalciferol (VITAMIN D) 50 MCG (2000 UT) tablet Take 2,000 Units by mouth daily.     diltiazem (CARDIZEM CD) 180 MG 24 hr capsule Take 180 mg by mouth daily.     diphenhydrAMINE (BENADRYL) 25 mg capsule Take 25 mg by mouth every 6 (six) hours as needed for allergies.     docusate sodium (COLACE) 100 MG capsule Take 100 mg by mouth daily as needed for mild constipation.     furosemide (LASIX) 20 MG tablet Take 20 mg by mouth 2 (two) times daily.     gabapentin (NEURONTIN) 100 MG capsule Take 2 capsules (200 mg total) by mouth 2 (two) times daily. Patient taking 100 mg in the morning and 200 mg at night.  Home med. 360 capsule  1   Glucosamine-Chondroit-Vit C-Mn (GLUCOSAMINE 1500 COMPLEX PO) Take 1 tablet by mouth 2 (two) times daily.     Glycerin-Polysorbate 80 (REFRESH DRY EYE THERAPY OP) Place 1 drop into both eyes 2 (two) times daily as needed (dry eyes).     guaiFENesin-codeine (ROBITUSSIN AC) 100-10 MG/5ML syrup Take 10 mLs by mouth 2 (two) times daily as needed for cough. 237 mL 0   MAGNESIUM CITRATE PO Take 400 mg by mouth 2 (two) times daily.     Melatonin 5 MG CAPS Take 5 mg by mouth at bedtime as needed (sleep).     metoprolol succinate (TOPROL-XL) 50 MG 24 hr tablet Take 50-100 mg by mouth See admin instructions. Take 100 mg in the  morning and 50 mg at night     Multiple Vitamin (MULTIVITAMIN WITH MINERALS) TABS tablet Take 0.5 tablets by mouth 2 (two) times daily.     mupirocin ointment (BACTROBAN) 2 % Apply 1 Application topically 3 (three) times daily.     Omega-3 Fatty Acids (FISH OIL) 1000 MG CAPS Take 2,000 mg by mouth in the morning, at noon, and at bedtime.     omeprazole (PRILOSEC) 20 MG capsule Take 1 capsule (20 mg total) by mouth daily. 90 capsule 3   rosuvastatin (CRESTOR) 5 MG tablet Take 5 mg by mouth 2 (two) times a week. Mondays and Thursdays     Silver (HYDROGEL AG) GEL Apply daily to open wound. Cleanse with Q-tip. 15 mL 0   vitamin E 1000 UNIT capsule Take 1,000 Units by mouth 2 (two) times daily.     warfarin (COUMADIN) 4 MG tablet Take 4 mg by mouth See admin instructions. Take 4 mg daily on Monday, Wednesday and Friday     warfarin (COUMADIN) 5 MG tablet Take 5 mg by mouth 2 (two) times a week. Take 5 mg by mouth daily on Sunday, Tuesday, Thursday and Saturday     [DISCONTINUED] levalbuterol (XOPENEX HFA) 45 MCG/ACT inhaler Inhale 1-2 puffs into the lungs every 4 (four) hours as needed for wheezing. 1 Inhaler 3   No current facility-administered medications on file prior to visit.    Review of Systems  Constitutional:  Negative for chills and fever.  HENT:  Positive for congestion.   Respiratory:  Positive for cough and shortness of breath.   Cardiovascular:  Positive for leg swelling. Negative for chest pain and palpitations.  Gastrointestinal:  Negative for nausea and vomiting.      Objective:    BP 118/80   Pulse 85   Temp (!) 97.4 F (36.3 C) (Oral)   Ht 5' 6"$  (1.676 m)   Wt 178 lb 14.4 oz (81.1 kg)   SpO2 96%   BMI 28.88 kg/m   BP Readings from Last 3 Encounters:  12/16/22 118/80  11/08/22 134/82  09/20/22 120/84   Wt Readings from Last 3 Encounters:  12/16/22 178 lb 14.4 oz (81.1 kg)  11/08/22 176 lb 9.6 oz (80.1 kg)  10/03/22 176 lb (79.8 kg)    Physical Exam Vitals  reviewed.  Constitutional:      Appearance: She is well-developed.  HENT:     Head: Normocephalic and atraumatic.     Right Ear: Hearing, tympanic membrane, ear canal and external ear normal. No decreased hearing noted. No drainage, swelling or tenderness. No middle ear effusion. No foreign body. Tympanic membrane is not erythematous or bulging.     Left Ear: Hearing, tympanic membrane, ear canal and external ear normal. No decreased  hearing noted. No drainage, swelling or tenderness.  No middle ear effusion. No foreign body. Tympanic membrane is not erythematous or bulging.     Nose: Nose normal. No rhinorrhea.     Right Sinus: No maxillary sinus tenderness or frontal sinus tenderness.     Left Sinus: No maxillary sinus tenderness or frontal sinus tenderness.     Mouth/Throat:     Pharynx: Uvula midline. No oropharyngeal exudate or posterior oropharyngeal erythema.     Tonsils: No tonsillar abscesses.  Eyes:     Conjunctiva/sclera: Conjunctivae normal.  Cardiovascular:     Rate and Rhythm: Regular rhythm.     Pulses: Normal pulses.     Heart sounds: Normal heart sounds.     Comments: Wearing compression stockings Pulmonary:     Effort: Pulmonary effort is normal.     Breath sounds: Normal breath sounds. No wheezing, rhonchi or rales.  Musculoskeletal:     Right lower leg: No edema.     Left lower leg: No edema.  Lymphadenopathy:     Head:     Right side of head: No submental, submandibular, tonsillar, preauricular, posterior auricular or occipital adenopathy.     Left side of head: No submental, submandibular, tonsillar, preauricular, posterior auricular or occipital adenopathy.     Cervical: No cervical adenopathy.  Skin:    General: Skin is warm and dry.  Neurological:     Mental Status: She is alert.  Psychiatric:        Speech: Speech normal.        Behavior: Behavior normal.        Thought Content: Thought content normal.

## 2022-12-16 NOTE — Patient Instructions (Addendum)
Start Plain mucinex- DM ( guaifenesin) which is a cough suppressant and expectorant.  Please drink plenty water at this.  As discussed I suspect your symptoms are more viral or allergic in etiology.  We discussed waiting couple days as you start Mucinex prior to starting antibiotic.  If symptoms persist or get worse and the certainly please start doxycycline  .Ensure to take probiotics while on antibiotics and also for 2 weeks after completion. This can either be by eating yogurt daily or taking a probiotic supplement over the counter such as Culturelle.It is important to re-colonize the gut with good bacteria and also to prevent any diarrheal infections associated with antibiotic use.   Please weigh yourself daily and stay vigilant as it relates to the any increase in shortness of breath or weight gain.   Please let me know how you are doing any concern  Acute Bronchitis, Adult  Acute bronchitis is sudden inflammation of the main airways (bronchi) that come off the windpipe (trachea) in the lungs. The swelling causes the airways to get smaller and make more mucus than normal. This can make it hard to breathe and can cause coughing or noisy breathing (wheezing). Acute bronchitis may last several weeks. The cough may last longer. Allergies, asthma, and exposure to smoke may make the condition worse. What are the causes? This condition can be caused by germs and by substances that irritate the lungs, including: Cold and flu viruses. The most common cause of this condition is the virus that causes the common cold. Bacteria. This is less common. Breathing in substances that irritate the lungs, including: Smoke from cigarettes and other forms of tobacco. Dust and pollen. Fumes from household cleaning products, gases, or burned fuel. Indoor or outdoor air pollution. What increases the risk? The following factors may make you more likely to develop this condition: A weak body's defense system, also  called the immune system. A condition that affects your lungs and breathing, such as asthma. What are the signs or symptoms? Common symptoms of this condition include: Coughing. This may bring up clear, yellow, or green mucus from your lungs (sputum). Wheezing. Runny or stuffy nose. Having too much mucus in your lungs (chest congestion). Shortness of breath. Aches and pains, including sore throat or chest. How is this diagnosed? This condition is usually diagnosed based on: Your symptoms and medical history. A physical exam. You may also have other tests, including tests to rule out other conditions, such as pneumonia. These tests include: A test of lung function. Test of a mucus sample to look for the presence of bacteria. Tests to check the oxygen level in your blood. Blood tests. Chest X-ray. How is this treated? Most cases of acute bronchitis clear up over time without treatment. Your health care provider may recommend: Drinking more fluids to help thin your mucus so it is easier to cough up. Taking inhaled medicine (inhaler) to improve air flow in and out of your lungs. Using a vaporizer or a humidifier. These are machines that add water to the air to help you breathe better. Taking a medicine that thins mucus and clears congestion (expectorant). Taking a medicine that prevents or stops coughing (cough suppressant). It is not common to take an antibiotic medicine for this condition. Follow these instructions at home:  Take over-the-counter and prescription medicines only as told by your health care provider. Use an inhaler, vaporizer, or humidifier as told by your health care provider. Take two teaspoons (10 mL) of honey at  bedtime to lessen coughing at night. Drink enough fluid to keep your urine pale yellow. Do not use any products that contain nicotine or tobacco. These products include cigarettes, chewing tobacco, and vaping devices, such as e-cigarettes. If you need help  quitting, ask your health care provider. Get plenty of rest. Return to your normal activities as told by your health care provider. Ask your health care provider what activities are safe for you. Keep all follow-up visits. This is important. How is this prevented? To lower your risk of getting this condition again: Wash your hands often with soap and water for at least 20 seconds. If soap and water are not available, use hand sanitizer. Avoid contact with people who have cold symptoms. Try not to touch your mouth, nose, or eyes with your hands. Avoid breathing in smoke or chemical fumes. Breathing smoke or chemical fumes will make your condition worse. Get the flu shot every year. Contact a health care provider if: Your symptoms do not improve after 2 weeks. You have trouble coughing up the mucus. Your cough keeps you awake at night. You have a fever. Get help right away if you: Cough up blood. Feel pain in your chest. Have severe shortness of breath. Faint or keep feeling like you are going to faint. Have a severe headache. Have a fever or chills that get worse. These symptoms may represent a serious problem that is an emergency. Do not wait to see if the symptoms will go away. Get medical help right away. Call your local emergency services (911 in the [place] Do not drive yourself to the hospital. Summary Acute bronchitis is inflammation of the main airways (bronchi) that come off the windpipe (trachea) in the lungs. The swelling causes the airways to get smaller and make more mucus than normal. Drinking more fluids can help thin your mucus so it is easier to cough up. Take over-the-counter and prescription medicines only as told by your health care provider. Do not use any products that contain nicotine or tobacco. These products include cigarettes, chewing tobacco, and vaping devices, such as e-cigarettes. If you need help quitting, ask your health care provider. Contact a health care  provider if your symptoms do not improve after 2 weeks. This information is not intended to replace advice given to you by your health care provider. Make sure you discuss any questions you have with your health care provider. Document Revised: 01/31/2022 Document Reviewed: 02/21/2021 Elsevier Patient Education  Person.

## 2022-12-16 NOTE — Telephone Encounter (Signed)
Call pt on   12/18/22 or 12/19/22  I want to check on her cough and IF she ended up starting doxycycline, antibiotic, for cough  If she did ,please order PT /INR lab and schedule half way through 7 day doxycycline course

## 2022-12-18 ENCOUNTER — Other Ambulatory Visit: Payer: Self-pay

## 2022-12-18 DIAGNOSIS — I4891 Unspecified atrial fibrillation: Secondary | ICD-10-CM

## 2022-12-18 DIAGNOSIS — I480 Paroxysmal atrial fibrillation: Secondary | ICD-10-CM | POA: Diagnosis not present

## 2022-12-18 NOTE — Telephone Encounter (Signed)
We dont need INR lab UNLESS she is taking doxycycline  Has she started and what day?

## 2022-12-18 NOTE — Telephone Encounter (Signed)
Spoke to pt and she stated that she feels better but still not 100%  I did schedule lab appt for 12/19/22 for INR

## 2022-12-18 NOTE — Telephone Encounter (Signed)
Spoke to pt and Pt started the Doxycycline the day that you prescribed it she stated that she will be finished on Mon 12/23/22

## 2022-12-18 NOTE — Telephone Encounter (Signed)
Noted INR scheduled for tomorrow

## 2022-12-19 ENCOUNTER — Other Ambulatory Visit (INDEPENDENT_AMBULATORY_CARE_PROVIDER_SITE_OTHER): Payer: Medicare HMO

## 2022-12-19 DIAGNOSIS — I493 Ventricular premature depolarization: Secondary | ICD-10-CM | POA: Diagnosis not present

## 2022-12-19 DIAGNOSIS — I4891 Unspecified atrial fibrillation: Secondary | ICD-10-CM

## 2022-12-19 DIAGNOSIS — Z7901 Long term (current) use of anticoagulants: Secondary | ICD-10-CM | POA: Diagnosis not present

## 2022-12-19 DIAGNOSIS — I5032 Chronic diastolic (congestive) heart failure: Secondary | ICD-10-CM | POA: Diagnosis not present

## 2022-12-19 DIAGNOSIS — I10 Essential (primary) hypertension: Secondary | ICD-10-CM | POA: Diagnosis not present

## 2022-12-19 DIAGNOSIS — E782 Mixed hyperlipidemia: Secondary | ICD-10-CM | POA: Diagnosis not present

## 2022-12-19 DIAGNOSIS — I480 Paroxysmal atrial fibrillation: Secondary | ICD-10-CM | POA: Diagnosis not present

## 2022-12-19 LAB — PROTIME-INR
INR: 3.7 ratio — ABNORMAL HIGH (ref 0.8–1.0)
Prothrombin Time: 37.3 s — ABNORMAL HIGH (ref 9.6–13.1)

## 2022-12-20 ENCOUNTER — Encounter: Payer: Self-pay | Admitting: Family

## 2022-12-20 ENCOUNTER — Telehealth: Payer: Self-pay | Admitting: Family

## 2022-12-20 NOTE — Telephone Encounter (Signed)
Pt called back and I read the message to her and she verbalized understanding 

## 2022-12-20 NOTE — Telephone Encounter (Signed)
Call pt   Prior INR 12/04/22 1.6 12/11/22 3.2 12/18/22 5.2   INR is very high , 3.7  Please confirm exactly how she is taking coumadin  Per [facility] telephone 12/18/22 ,INR 5.2, goal 2-3. Hold for 2 nights then resume 5 mg daily, recheck on mon 2/19    Per chart she is taking coumadin 6m Monday, Wednesday and Friday Coumadin 570mTuesday, Thursday and saturdayay  I would like to speak with dr tuDerrel Nipo we can advise   Please do not take coumadin today until she has heard from [place]

## 2022-12-20 NOTE — Telephone Encounter (Signed)
Spoke to pt and she specified that she is only taking the 5 mg every night. Per Cardilogist but on tues 12/17/22 they told her to stop taking it for 2 days and resume on 2/16.

## 2022-12-20 NOTE — Telephone Encounter (Signed)
LVM to inform pt call pt that she can resume taking the 5 mg Coumadin today 12/20/22 and to keep appt with Cardiology on 12/23/22

## 2022-12-20 NOTE — Telephone Encounter (Signed)
Call [facility] cardiology [phone]  I need to speak with PA, Leroy Libman. I also sent her a secure chat in Epic  Here is my message if you want to give to nurse prior to her calling me. My cell is [phone]     I wanted your advice on managing pt's INR. I started on doxycycline 2/12- 12/23/22.   INR yesterday 12/19/22 3.7. INR on 12/18/22 5.2   She has suspended coumadin 30m QD for 2/14 and 2/15.   Would you advise that she hold coumadin today and take a 2.560mcoumadin over the weekend? She has INR check for 12/23/22 in your office.

## 2022-12-20 NOTE — Telephone Encounter (Signed)
Spoke to pt and she stated that she had seen her Cardiologist and they took her off the Coumadin for 2 days. She was suppose to start back taking it today but I advised her not to per your note until she hears back from [place]. Pt verbalized understanding

## 2022-12-20 NOTE — Telephone Encounter (Signed)
Spoke to [employer]  @ [facility] and she stated that pt is ok to resume her Coumadin on 2/16 even though she is taking the Doxycycline and they would recheck it again on 2/19!

## 2022-12-23 DIAGNOSIS — I341 Nonrheumatic mitral (valve) prolapse: Secondary | ICD-10-CM | POA: Diagnosis not present

## 2022-12-23 DIAGNOSIS — E782 Mixed hyperlipidemia: Secondary | ICD-10-CM | POA: Diagnosis not present

## 2022-12-23 DIAGNOSIS — R791 Abnormal coagulation profile: Secondary | ICD-10-CM | POA: Diagnosis not present

## 2022-12-23 DIAGNOSIS — I493 Ventricular premature depolarization: Secondary | ICD-10-CM | POA: Diagnosis not present

## 2022-12-23 DIAGNOSIS — I480 Paroxysmal atrial fibrillation: Secondary | ICD-10-CM | POA: Diagnosis not present

## 2022-12-23 DIAGNOSIS — I10 Essential (primary) hypertension: Secondary | ICD-10-CM | POA: Diagnosis not present

## 2022-12-25 NOTE — Telephone Encounter (Signed)
INR 2.5 on 12/23/22

## 2022-12-26 DIAGNOSIS — I10 Essential (primary) hypertension: Secondary | ICD-10-CM | POA: Diagnosis not present

## 2022-12-26 DIAGNOSIS — I341 Nonrheumatic mitral (valve) prolapse: Secondary | ICD-10-CM | POA: Diagnosis not present

## 2022-12-26 DIAGNOSIS — E782 Mixed hyperlipidemia: Secondary | ICD-10-CM | POA: Diagnosis not present

## 2022-12-26 DIAGNOSIS — I480 Paroxysmal atrial fibrillation: Secondary | ICD-10-CM | POA: Diagnosis not present

## 2022-12-26 DIAGNOSIS — I5032 Chronic diastolic (congestive) heart failure: Secondary | ICD-10-CM | POA: Diagnosis not present

## 2022-12-26 DIAGNOSIS — I493 Ventricular premature depolarization: Secondary | ICD-10-CM | POA: Diagnosis not present

## 2022-12-31 ENCOUNTER — Encounter: Admission: RE | Payer: Self-pay | Source: Ambulatory Visit

## 2022-12-31 ENCOUNTER — Ambulatory Visit: Admission: RE | Admit: 2022-12-31 | Payer: Medicare HMO | Source: Ambulatory Visit | Admitting: Cardiology

## 2022-12-31 SURGERY — CARDIOVERSION
Anesthesia: General

## 2023-01-06 DIAGNOSIS — R791 Abnormal coagulation profile: Secondary | ICD-10-CM | POA: Diagnosis not present

## 2023-01-16 ENCOUNTER — Other Ambulatory Visit: Payer: Self-pay | Admitting: Internal Medicine

## 2023-01-16 DIAGNOSIS — R053 Chronic cough: Secondary | ICD-10-CM

## 2023-01-22 DIAGNOSIS — I493 Ventricular premature depolarization: Secondary | ICD-10-CM | POA: Diagnosis not present

## 2023-01-23 DIAGNOSIS — I5032 Chronic diastolic (congestive) heart failure: Secondary | ICD-10-CM | POA: Diagnosis not present

## 2023-01-23 DIAGNOSIS — I493 Ventricular premature depolarization: Secondary | ICD-10-CM | POA: Diagnosis not present

## 2023-01-23 DIAGNOSIS — I341 Nonrheumatic mitral (valve) prolapse: Secondary | ICD-10-CM | POA: Diagnosis not present

## 2023-01-23 DIAGNOSIS — E782 Mixed hyperlipidemia: Secondary | ICD-10-CM | POA: Diagnosis not present

## 2023-01-23 DIAGNOSIS — I10 Essential (primary) hypertension: Secondary | ICD-10-CM | POA: Diagnosis not present

## 2023-01-23 DIAGNOSIS — I480 Paroxysmal atrial fibrillation: Secondary | ICD-10-CM | POA: Diagnosis not present

## 2023-01-30 DIAGNOSIS — H1132 Conjunctival hemorrhage, left eye: Secondary | ICD-10-CM | POA: Diagnosis not present

## 2023-02-10 DIAGNOSIS — I480 Paroxysmal atrial fibrillation: Secondary | ICD-10-CM | POA: Diagnosis not present

## 2023-03-10 ENCOUNTER — Telehealth: Payer: Self-pay | Admitting: Internal Medicine

## 2023-03-10 DIAGNOSIS — I480 Paroxysmal atrial fibrillation: Secondary | ICD-10-CM | POA: Diagnosis not present

## 2023-03-10 MED ORDER — OMEPRAZOLE 20 MG PO CPDR: 20.0000 mg | DELAYED_RELEASE_CAPSULE | Freq: Every day | ORAL | 3 refills | Status: DC

## 2023-03-10 NOTE — Telephone Encounter (Signed)
Pt need a refill on omeprazole sent to [facility]

## 2023-03-10 NOTE — Telephone Encounter (Signed)
refilled 

## 2023-03-20 DIAGNOSIS — M79674 Pain in right toe(s): Secondary | ICD-10-CM | POA: Diagnosis not present

## 2023-03-20 DIAGNOSIS — B351 Tinea unguium: Secondary | ICD-10-CM | POA: Diagnosis not present

## 2023-03-20 DIAGNOSIS — L600 Ingrowing nail: Secondary | ICD-10-CM | POA: Diagnosis not present

## 2023-03-20 DIAGNOSIS — L03031 Cellulitis of right toe: Secondary | ICD-10-CM | POA: Diagnosis not present

## 2023-03-27 DIAGNOSIS — D225 Melanocytic nevi of trunk: Secondary | ICD-10-CM | POA: Diagnosis not present

## 2023-03-27 DIAGNOSIS — D2261 Melanocytic nevi of right upper limb, including shoulder: Secondary | ICD-10-CM | POA: Diagnosis not present

## 2023-03-27 DIAGNOSIS — D2272 Melanocytic nevi of left lower limb, including hip: Secondary | ICD-10-CM | POA: Diagnosis not present

## 2023-03-27 DIAGNOSIS — Z85828 Personal history of other malignant neoplasm of skin: Secondary | ICD-10-CM | POA: Diagnosis not present

## 2023-03-27 DIAGNOSIS — D2271 Melanocytic nevi of right lower limb, including hip: Secondary | ICD-10-CM | POA: Diagnosis not present

## 2023-03-27 DIAGNOSIS — L821 Other seborrheic keratosis: Secondary | ICD-10-CM | POA: Diagnosis not present

## 2023-03-27 DIAGNOSIS — Z08 Encounter for follow-up examination after completed treatment for malignant neoplasm: Secondary | ICD-10-CM | POA: Diagnosis not present

## 2023-03-27 DIAGNOSIS — Z85820 Personal history of malignant melanoma of skin: Secondary | ICD-10-CM | POA: Diagnosis not present

## 2023-03-27 DIAGNOSIS — D2262 Melanocytic nevi of left upper limb, including shoulder: Secondary | ICD-10-CM | POA: Diagnosis not present

## 2023-04-10 DIAGNOSIS — I480 Paroxysmal atrial fibrillation: Secondary | ICD-10-CM | POA: Diagnosis not present

## 2023-04-17 DIAGNOSIS — R791 Abnormal coagulation profile: Secondary | ICD-10-CM | POA: Diagnosis not present

## 2023-04-24 DIAGNOSIS — R791 Abnormal coagulation profile: Secondary | ICD-10-CM | POA: Diagnosis not present

## 2023-04-28 DIAGNOSIS — R791 Abnormal coagulation profile: Secondary | ICD-10-CM | POA: Diagnosis not present

## 2023-05-05 DIAGNOSIS — R791 Abnormal coagulation profile: Secondary | ICD-10-CM | POA: Diagnosis not present

## 2023-05-19 DIAGNOSIS — R791 Abnormal coagulation profile: Secondary | ICD-10-CM | POA: Diagnosis not present

## 2023-05-26 DIAGNOSIS — R791 Abnormal coagulation profile: Secondary | ICD-10-CM | POA: Diagnosis not present

## 2023-05-29 DIAGNOSIS — Z452 Encounter for adjustment and management of vascular access device: Secondary | ICD-10-CM | POA: Diagnosis not present

## 2023-05-29 DIAGNOSIS — Z5181 Encounter for therapeutic drug level monitoring: Secondary | ICD-10-CM | POA: Diagnosis not present

## 2023-05-29 DIAGNOSIS — Z79899 Other long term (current) drug therapy: Secondary | ICD-10-CM | POA: Diagnosis not present

## 2023-05-29 DIAGNOSIS — I4891 Unspecified atrial fibrillation: Secondary | ICD-10-CM | POA: Diagnosis not present

## 2023-05-29 DIAGNOSIS — I480 Paroxysmal atrial fibrillation: Secondary | ICD-10-CM | POA: Diagnosis not present

## 2023-05-29 DIAGNOSIS — I341 Nonrheumatic mitral (valve) prolapse: Secondary | ICD-10-CM | POA: Diagnosis not present

## 2023-05-29 DIAGNOSIS — I10 Essential (primary) hypertension: Secondary | ICD-10-CM | POA: Diagnosis not present

## 2023-05-29 DIAGNOSIS — I5032 Chronic diastolic (congestive) heart failure: Secondary | ICD-10-CM | POA: Diagnosis not present

## 2023-05-29 DIAGNOSIS — E782 Mixed hyperlipidemia: Secondary | ICD-10-CM | POA: Diagnosis not present

## 2023-05-29 DIAGNOSIS — I493 Ventricular premature depolarization: Secondary | ICD-10-CM | POA: Diagnosis not present

## 2023-06-11 DIAGNOSIS — R791 Abnormal coagulation profile: Secondary | ICD-10-CM | POA: Diagnosis not present

## 2023-06-18 ENCOUNTER — Encounter: Payer: Self-pay | Admitting: Pharmacist

## 2023-06-18 NOTE — Progress Notes (Signed)
Pharmacy Quality Measure Review  This patient is appearing on a report for being at risk of failing the adherence measure for cholesterol (statin) medications this calendar year.   Medication: rosuvastatin 5 mg  Last fill date: 7/13 for 70 day supply  Insurance report was not up to date. No action needed at this time.   Catie Eppie Gibson, PharmD, BCACP, CPP Clinical Pharmacist [facility] [phone]

## 2023-07-03 ENCOUNTER — Telehealth: Payer: Self-pay

## 2023-07-03 NOTE — Patient Outreach (Signed)
  Care Coordination   Initial Visit Note   07/03/2023 Name: Iliah Moffitt MRN: [id number] DOB: Apr 03, 1941  Nakesha Samora is a 82 y.o. year old female who sees Darrick Huntsman, Mar Daring, MD for primary care. I spoke with  Walker Kehr by phone today.  What matters to the patients health and wellness today?  CM educated patient on [facility] services.  Patient declines services and feels that she is able to manage her medical needs.  Patient agreed to contact her provider in the future if she needs [facility] services.    Goals Addressed   None     SDOH assessments and interventions completed:  No     Care Coordination Interventions:  Yes, provided   Follow up plan: No further intervention required.   Encounter Outcome:  Pt. Visit Completed

## 2023-07-09 DIAGNOSIS — I480 Paroxysmal atrial fibrillation: Secondary | ICD-10-CM | POA: Diagnosis not present

## 2023-07-17 DIAGNOSIS — H02403 Unspecified ptosis of bilateral eyelids: Secondary | ICD-10-CM | POA: Diagnosis not present

## 2023-07-17 DIAGNOSIS — G4452 New daily persistent headache (NDPH): Secondary | ICD-10-CM | POA: Diagnosis not present

## 2023-07-17 DIAGNOSIS — H35379 Puckering of macula, unspecified eye: Secondary | ICD-10-CM | POA: Diagnosis not present

## 2023-07-17 DIAGNOSIS — H26492 Other secondary cataract, left eye: Secondary | ICD-10-CM | POA: Diagnosis not present

## 2023-07-30 DIAGNOSIS — I480 Paroxysmal atrial fibrillation: Secondary | ICD-10-CM | POA: Diagnosis not present

## 2023-08-04 ENCOUNTER — Encounter: Payer: Self-pay | Admitting: Internal Medicine

## 2023-08-04 ENCOUNTER — Ambulatory Visit (INDEPENDENT_AMBULATORY_CARE_PROVIDER_SITE_OTHER): Payer: Medicare HMO | Admitting: Internal Medicine

## 2023-08-04 ENCOUNTER — Other Ambulatory Visit: Payer: Self-pay | Admitting: Internal Medicine

## 2023-08-04 VITALS — BP 128/74 | HR 60 | Temp 98.4°F | Ht 66.0 in | Wt 166.4 lb

## 2023-08-04 DIAGNOSIS — E041 Nontoxic single thyroid nodule: Secondary | ICD-10-CM

## 2023-08-04 DIAGNOSIS — Z79899 Other long term (current) drug therapy: Secondary | ICD-10-CM | POA: Diagnosis not present

## 2023-08-04 DIAGNOSIS — C8599 Non-Hodgkin lymphoma, unspecified, extranodal and solid organ sites: Secondary | ICD-10-CM | POA: Diagnosis not present

## 2023-08-04 DIAGNOSIS — I420 Dilated cardiomyopathy: Secondary | ICD-10-CM | POA: Diagnosis not present

## 2023-08-04 DIAGNOSIS — R7301 Impaired fasting glucose: Secondary | ICD-10-CM | POA: Diagnosis not present

## 2023-08-04 DIAGNOSIS — J40 Bronchitis, not specified as acute or chronic: Secondary | ICD-10-CM | POA: Diagnosis not present

## 2023-08-04 DIAGNOSIS — I4891 Unspecified atrial fibrillation: Secondary | ICD-10-CM | POA: Diagnosis not present

## 2023-08-04 DIAGNOSIS — E785 Hyperlipidemia, unspecified: Secondary | ICD-10-CM | POA: Diagnosis not present

## 2023-08-04 DIAGNOSIS — Z853 Personal history of malignant neoplasm of breast: Secondary | ICD-10-CM

## 2023-08-04 DIAGNOSIS — I10 Essential (primary) hypertension: Secondary | ICD-10-CM | POA: Diagnosis not present

## 2023-08-04 DIAGNOSIS — N1831 Chronic kidney disease, stage 3a: Secondary | ICD-10-CM

## 2023-08-04 DIAGNOSIS — R053 Chronic cough: Secondary | ICD-10-CM

## 2023-08-04 LAB — COMPREHENSIVE METABOLIC PANEL
ALT: 22 U/L (ref 0–35)
AST: 23 U/L (ref 0–37)
Albumin: 3.8 g/dL (ref 3.5–5.2)
Alkaline Phosphatase: 58 U/L (ref 39–117)
BUN: 11 mg/dL (ref 6–23)
CO2: 28 meq/L (ref 19–32)
Calcium: 8.8 mg/dL (ref 8.4–10.5)
Chloride: 102 meq/L (ref 96–112)
Creatinine, Ser: 0.85 mg/dL (ref 0.40–1.20)
GFR: 63.58 mL/min (ref >60.00)
Glucose, Bld: 108 mg/dL — ABNORMAL HIGH (ref 70–99)
Potassium: 4 meq/L (ref 3.5–5.1)
Sodium: 139 meq/L (ref 135–145)
Total Bilirubin: 0.8 mg/dL (ref 0.2–1.2)
Total Protein: 5.9 g/dL — ABNORMAL LOW (ref 6.0–8.3)

## 2023-08-04 LAB — CBC WITH DIFFERENTIAL/PLATELET
Basophils Absolute: 0 10*3/uL (ref 0.0–0.1)
Basophils Relative: 0.3 % (ref 0.0–3.0)
Eosinophils Absolute: 0.1 10*3/uL (ref 0.0–0.7)
Eosinophils Relative: 1.4 % (ref 0.0–5.0)
HCT: 39.7 % (ref 36.0–46.0)
Hemoglobin: 13.2 g/dL (ref 12.0–15.0)
Lymphocytes Relative: 34 % (ref 12.0–46.0)
Lymphs Abs: 2.4 10*3/uL (ref 0.7–4.0)
MCHC: 33.3 g/dL (ref 30.0–36.0)
MCV: 95.8 fL (ref 78.0–100.0)
Monocytes Absolute: 0.6 10*3/uL (ref 0.1–1.0)
Monocytes Relative: 8.8 % (ref 3.0–12.0)
Neutro Abs: 4 10*3/uL (ref 1.4–7.7)
Neutrophils Relative %: 55.5 % (ref 43.0–77.0)
Platelets: 312 10*3/uL (ref 150.0–400.0)
RBC: 4.15 Mil/uL (ref 3.87–5.11)
RDW: 14.5 % (ref 11.5–15.5)
WBC: 7.1 10*3/uL (ref 4.0–10.5)

## 2023-08-04 LAB — HEMOGLOBIN A1C: Hgb A1c MFr Bld: 5.7 % (ref 4.6–6.5)

## 2023-08-04 LAB — LDL CHOLESTEROL, DIRECT: Direct LDL: 132 mg/dL

## 2023-08-04 LAB — LIPID PANEL
Cholesterol: 218 mg/dL — ABNORMAL HIGH (ref 0–200)
HDL: 53.5 mg/dL (ref >39.00)
LDL Cholesterol: 128 mg/dL — ABNORMAL HIGH (ref 0–99)
NonHDL: 164.23
Total CHOL/HDL Ratio: 4
Triglycerides: 181 mg/dL — ABNORMAL HIGH (ref 0.0–149.0)
VLDL: 36.2 mg/dL (ref 0.0–40.0)

## 2023-08-04 LAB — TSH: TSH: 2.35 u[IU]/mL (ref 0.35–5.50)

## 2023-08-04 LAB — MICROALBUMIN / CREATININE URINE RATIO
Creatinine,U: 16.3 mg/dL
Microalb Creat Ratio: 4.3 mg/g (ref 0.0–30.0)
Microalb, Ur: <0.7 mg/dL (ref 0.0–1.9)

## 2023-08-04 MED ORDER — AMOXICILLIN-POT CLAVULANATE 875-125 MG PO TABS: 1.0000 | ORAL_TABLET | Freq: Two times a day (BID) | ORAL | 0 refills | Status: DC

## 2023-08-04 NOTE — Assessment & Plan Note (Signed)
She is at goal on metoprolol and diltiazem.  No changes today

## 2023-08-04 NOTE — Assessment & Plan Note (Addendum)
Reviewed last cardiology office note.   EKG twas done showing  sinus brady with PVCs rate 54 bpm.   She continues to enjoy sinus rhythm  without recurrent palpitations, dizziness on current regimen of metoprolol 150 mg in the morning and 50 mg in the evening, diltiazem 240 mg daily for rate control.  Additional diltiazem 30 mg as needed for episodes of atrial fibrillation, and  amiodarone 200 mg daily for rhythm control.   Thyroid  LFTs to  be checked today

## 2023-08-04 NOTE — Assessment & Plan Note (Signed)
In remission since 2021.  Reviewed last oncology note from 2021:-  -Patient appears to be doing well without clinical evidence of recurrent marginal zone lymphoma --She is managing her chronic sinusitis with saline, topical decongestants, antihistamines. --

## 2023-08-04 NOTE — Assessment & Plan Note (Signed)
Diagnosed in 2015,  Managed with lumpectomy, XRT and TAM.  She completed  tamoxifen for a total of 5 years .last oncology follow up was in 2021 .  follow-up care for her breast cancer  was transferred to her PCP,

## 2023-08-04 NOTE — Assessment & Plan Note (Signed)
SHE HAS RONCHI ON EXAM AND NOTES A CHANGE IN COLOF OF SPUTU.  AUGMENTIN PRESCRIBED

## 2023-08-04 NOTE — Assessment & Plan Note (Signed)
Her previously noted decline in GFR was apparently aggravated by chronic diuretic use but has resolved.   (still using 40 mg lasix daily in divided doses).  Renal ultrasound cancelled,  ,; nephrology referral deferred   She is avoiding NSAIDS due to chronic anticoagulation

## 2023-08-04 NOTE — Progress Notes (Signed)
Subjective:  Patient ID: Carol [name], female    DOB: [birth year]  Age: 82 y.o. MRN: [id number]  CC: The primary encounter diagnosis was Benign essential HTN. Diagnoses of Thyroid nodule, Hyperlipidemia, unspecified hyperlipidemia type, Long-term use of high-risk medication, Impaired fasting glucose, Lymphoma of paranasal sinus (HCC), History of breast cancer, Atrial fibrillation, unspecified type (HCC), Bronchitis, Cardiomyopathy, dilated, nonischemic (HCC), and Stage 3a chronic kidney disease (HCC) were also pertinent to this visit.   HPI Carol [name] presents for  Chief Complaint  Patient presents with   Medical Management of Chronic Issues   1) aortic atherosclerosis: Reviewed findings of prior CT scan today..  Patient is tolerating high potency statin therapy  rosuvastatin 5 mg daiLy  2 ) chronic sinusitis/bronchitis:   last augmentin use was 9 months ago.  CURRENT ENDORSES  Sinus congestion without pain or fevers.   Cough is moist sounding and phlegm is yellow  but at times drak green   takes a probiotic     3) PAF;  HAS BEEN WITHOUT PALPITATIONS  since regimen was changed to include metoprolol,  diltiazem ad amiodarone.       Outpatient Medications Prior to Visit  Medication Sig Dispense Refill   acetaminophen (TYLENOL) 500 MG tablet Take 1,000 mg by mouth every 6 (six) hours as needed for moderate pain or headache.     amiodarone (PACERONE) 200 MG tablet Take 200 mg by mouth daily.     b complex vitamins capsule Take 1 capsule by mouth daily.     bisacodyl (DULCOLAX) 5 MG EC tablet Take 5 mg by mouth daily as needed for moderate constipation.     Cholecalciferol (VITAMIN D) 50 MCG (2000 UT) tablet Take 2,000 Units by mouth daily.     diltiazem (CARDIZEM CD) 180 MG 24 hr capsule Take 180 mg by mouth daily.     diphenhydrAMINE (BENADRYL) 25 mg capsule Take 25 mg by mouth every 6 (six) hours as needed for allergies.     docusate sodium (COLACE) 100 MG  capsule Take 100 mg by mouth daily as needed for mild constipation.     furosemide (LASIX) 20 MG tablet Take 20 mg by mouth 2 (two) times daily.     gabapentin (NEURONTIN) 100 MG capsule Take 2 capsules (200 mg total) by mouth 2 (two) times daily. Patient taking 100 mg in the morning and 200 mg at night.  Home med. 360 capsule 1   Glucosamine-Chondroit-Vit C-Mn (GLUCOSAMINE 1500 COMPLEX PO) Take 1 tablet by mouth 2 (two) times daily.     Glycerin-Polysorbate 80 (REFRESH DRY EYE THERAPY OP) Place 1 drop into both eyes 2 (two) times daily as needed (dry eyes).     guaiFENesin-codeine 100-10 MG/5ML syrup TAKE 10 ML BY MOUTH  TWICE DAILY AS NEEDED FOR COUGH 237 mL 0   MAGNESIUM CITRATE PO Take 400 mg by mouth 2 (two) times daily.     Melatonin 5 MG CAPS Take 5 mg by mouth at bedtime as needed (sleep).     metoprolol succinate (TOPROL-XL) 50 MG 24 hr tablet Take 50-100 mg by mouth See admin instructions. Take 100 mg in the morning and 50 mg at night     Multiple Vitamin (MULTIVITAMIN WITH MINERALS) TABS tablet Take 0.5 tablets by mouth 2 (two) times daily.     mupirocin ointment (BACTROBAN) 2 % Apply 1 Application topically 3 (three) times daily.     Omega-3 Fatty Acids (FISH OIL) 1000 MG CAPS Take 2,000 mg by  mouth in the morning, at noon, and at bedtime.     omeprazole (PRILOSEC) 20 MG capsule Take 1 capsule (20 mg total) by mouth daily. 90 capsule 3   rosuvastatin (CRESTOR) 5 MG tablet Take 5 mg by mouth 2 (two) times a week. Mondays and Thursdays     Silver (HYDROGEL AG) GEL Apply daily to open wound. Cleanse with Q-tip. 15 mL 0   vitamin E 1000 UNIT capsule Take 1,000 Units by mouth 2 (two) times daily.     warfarin (COUMADIN) 4 MG tablet Take 4 mg by mouth See admin instructions. Take 4 mg daily on Monday, Wednesday and Friday     warfarin (COUMADIN) 5 MG tablet Take 5 mg by mouth 2 (two) times a week. Take 5 mg by mouth daily on Sunday, Tuesday, Thursday and Saturday     No facility-administered  medications prior to visit.    Review of Systems;  Patient denies headache, fevers, malaise, unintentional weight loss, skin rash, eye pain, sinus congestion and sinus pain, sore throat, dysphagia,  hemoptysis , cough, dyspnea, wheezing, chest pain, palpitations, orthopnea, edema, abdominal pain, nausea, melena, diarrhea, constipation, flank pain, dysuria, hematuria, urinary  Frequency, nocturia, numbness, tingling, seizures,  Focal weakness, Loss of consciousness,  Tremor, insomnia, depression, anxiety, and suicidal ideation.      Objective:  BP 128/74   Pulse 60   Temp 98.4 F (36.9 C) (Oral)   Ht 5\' 6"  (1.676 m)   Wt 166 lb 6.4 oz (75.5 kg)   SpO2 96%   BMI 26.86 kg/m   BP Readings from Last 3 Encounters:  08/04/23 128/74  12/16/22 118/80  11/08/22 134/82    Wt Readings from Last 3 Encounters:  08/04/23 166 lb 6.4 oz (75.5 kg)  12/16/22 178 lb 14.4 oz (81.1 kg)  11/08/22 176 lb 9.6 oz (80.1 kg)    Physical Exam Vitals reviewed.  Constitutional:      General: She is not in acute distress.    Appearance: Normal appearance. She is normal weight. She is not ill-appearing, toxic-appearing or diaphoretic.  HENT:     Head: Normocephalic.  Eyes:     General: No scleral icterus.       Right eye: No discharge.        Left eye: No discharge.     Conjunctiva/sclera: Conjunctivae normal.  Cardiovascular:     Rate and Rhythm: Normal rate and regular rhythm.     Heart sounds: Normal heart sounds.  Pulmonary:     Effort: Pulmonary effort is normal. No respiratory distress.     Breath sounds: Examination of the right-upper field reveals rhonchi. Examination of the left-upper field reveals rhonchi. Examination of the right-middle field reveals rhonchi. Examination of the left-middle field reveals decreased breath sounds and rhonchi. Decreased breath sounds and rhonchi present.  Musculoskeletal:        General: Normal range of motion.  Skin:    General: Skin is warm and dry.   Neurological:     General: No focal deficit present.     Mental Status: She is alert and oriented to person, place, and time. Mental status is at baseline.  Psychiatric:        Mood and Affect: Mood normal.        Behavior: Behavior normal.        Thought Content: Thought content normal.        Judgment: Judgment normal.    Lab Results  Component Value Date   HGBA1C 5.7 (  H) 10/08/2021   HGBA1C 5.7 (H) 10/07/2021   HGBA1C 5.7 05/27/2016    Lab Results  Component Value Date   CREATININE 0.82 11/08/2022   CREATININE 1.05 09/11/2022   CREATININE 1.03 06/07/2022    Lab Results  Component Value Date   WBC 8.6 11/08/2022   HGB 12.9 11/08/2022   HCT 38.2 11/08/2022   PLT 310.0 11/08/2022   GLUCOSE 97 11/08/2022   CHOL 205 (H) 11/08/2022   TRIG 100.0 11/08/2022   HDL 59.10 11/08/2022   LDLDIRECT 124.0 11/08/2022   LDLCALC 126 (H) 11/08/2022   ALT 22 11/08/2022   AST 23 11/08/2022   NA 140 11/08/2022   K 3.8 11/08/2022   CL 102 11/08/2022   CREATININE 0.82 11/08/2022   BUN 20 11/08/2022   CO2 29 11/08/2022   TSH 2.35 11/08/2022   INR 3.7 (H) 12/19/2022   HGBA1C 5.7 (H) 10/08/2021    US BREAST LTD UNI RIGHT INC AXILLA  Result Date: 04/23/2022 CLINICAL DATA:  82 year old presenting with burning pain in the RIGHT breast. 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 She presents now with worsening pain. She was given a prescription for Augmentin 3 days ago. Personal history of malignant RIGHT breast lumpectomy in July, 2015 at [facility]. EXAM: ULTRASOUND OF THE RIGHT BREAST COMPARISON:  RIGHT breast ultrasound 05/18/2014 at the time of breast cancer diagnosis. No interval breast imaging at [facility]. She states that she receives her breast imaging at [facility]. FINDINGS: Targeted ultrasound is performed, showing a heterogeneous mixed solid and  fluid collection in the superficial tissues at the 11:30 o'clock position 6 cm from the nipple measuring approximately 2.6 x 1.6 x 2.2 cm, demonstrating posterior acoustic enhancement and mild hyperemia on power Doppler flow. There is skin thickening/edema and there is edema in the surrounding breast tissues. IMPRESSION: Approximate 2.6 cm phlegmon in the superficial tissues of the RIGHT breast at the 11:30 o'clock position 6 cm from the nipple. This has not yet liquified into an abscess and is not a drainable collection. RECOMMENDATION: 1. Antibiotic therapy. 2. If symptoms persist, follow-up RIGHT breast ultrasound in 2 weeks after completion of antibiotic therapy. 3. Annual mammography for which the patient currently goes to [facility] BI-RADS CATEGORY  2: Benign. Electronically Signed   By: Hulan Saas M.D.   On: 04/23/2022 13:28    Assessment & Plan:  .Benign essential HTN Assessment & Plan: She is at goal on metoprolol and diltiazem.  No changes today   Orders: -     Comprehensive metabolic panel -     Microalbumin / creatinine urine ratio  Thyroid nodule -     TSH  Hyperlipidemia, unspecified hyperlipidemia type -     Lipid panel -     LDL cholesterol, direct  Long-term use of high-risk medication -     CBC with Differential/Platelet  Impaired fasting glucose -     Hemoglobin A1c  Lymphoma of paranasal sinus (HCC) Assessment & Plan: In remission since 2021.  Reviewed last oncology note from 2021:-  -Patient appears to be doing well without clinical evidence of recurrent marginal zone lymphoma --She is managing her chronic sinusitis with saline, topical decongestants, antihistamines. --   History of breast cancer Assessment & Plan: Diagnosed in 2015,  Managed with lumpectomy, XRT and TAM.  She completed  tamoxifen for a total of 5 years .last oncology  follow up was in 2021 .  follow-up care for her breast cancer  was transferred to her PCP,    Atrial fibrillation,  unspecified type [facility]) Assessment & Plan: Reviewed last cardiology office note.   EKG twas done showing  sinus brady with PVCs rate 54 bpm.   She continues to enjoy sinus rhythm  without recurrent palpitations, dizziness on current regimen of metoprolol 150 mg in the morning and 50 mg in the evening, diltiazem 240 mg daily for rate control.  Additional diltiazem 30 mg as needed for episodes of atrial fibrillation, and  amiodarone 200 mg daily for rhythm control.   Thyroid  LFTs to  be checked today     Bronchitis Assessment & Plan: SHE HAS RONCHI ON EXAM AND NOTES A CHANGE IN COLOF OF SPUTU.  AUGMENTIN PRESCRIBED    Cardiomyopathy, dilated, nonischemic (HCC) Assessment & Plan: She requires daily use of furosemide to manage edema .  Weight has been stable    Stage 3a chronic kidney disease (HCC) Assessment & Plan: Her previously noted decline in GFR was apparently aggravated by chronic diuretic use but has resolved.   (still using 40 mg lasix daily in divided doses).  Renal ultrasound cancelled,  ,; nephrology referral deferred   She is avoiding NSAIDS due to chronic anticoagulation    Other orders -     Amoxicillin-Pot Clavulanate; Take 1 tablet by mouth 2 (two) times daily.  Dispense: 14 tablet; Refill: 0     I provided 30 minutes of face-to-face time during this encounter reviewing patient's last visit with me, patient's  most recent visit with cardiology,  HEMATOLOGY/ONCOLOGY,   recent surgical and non surgical procedures, previous  labs and imaging studies, counseling on currently addressed issues,  and post visit ordering to diagnostics and therapeutics .   Follow-up: No follow-ups on file.   Sherlene Shams, MD

## 2023-08-04 NOTE — Assessment & Plan Note (Signed)
She requires daily use of furosemide to manage edema .  Weight has been stable

## 2023-08-04 NOTE — Patient Instructions (Signed)
I am prescribing augmentin for you to take twice daily for one week  CONTINUE PROBIOTICS  POSTPONE FLU VACCINE UNTIL MID OCTOBER

## 2023-08-06 ENCOUNTER — Other Ambulatory Visit: Payer: Self-pay | Admitting: Internal Medicine

## 2023-08-06 DIAGNOSIS — R053 Chronic cough: Secondary | ICD-10-CM

## 2023-08-06 NOTE — Telephone Encounter (Signed)
Prescription Request  08/06/2023  LOV: 08/04/2023  What is the name of the medication or equipment? guaiFENesin-codeine   Have you contacted your pharmacy to request a refill? No   Which pharmacy would you like this sent to? [facility]   Patient notified that their request is being sent to the clinical staff for review and that they should receive a response within 2 business days.   Please advise at Mobile [phone] (mobile)

## 2023-08-06 NOTE — Telephone Encounter (Signed)
Refilled: 01/16/2023 Last OV: 08/04/2023 Next OV: 02/02/2024

## 2023-08-21 DIAGNOSIS — I5032 Chronic diastolic (congestive) heart failure: Secondary | ICD-10-CM | POA: Diagnosis not present

## 2023-08-21 DIAGNOSIS — I10 Essential (primary) hypertension: Secondary | ICD-10-CM | POA: Diagnosis not present

## 2023-08-21 DIAGNOSIS — I480 Paroxysmal atrial fibrillation: Secondary | ICD-10-CM | POA: Diagnosis not present

## 2023-08-21 DIAGNOSIS — I493 Ventricular premature depolarization: Secondary | ICD-10-CM | POA: Diagnosis not present

## 2023-08-21 DIAGNOSIS — E782 Mixed hyperlipidemia: Secondary | ICD-10-CM | POA: Diagnosis not present

## 2023-08-21 DIAGNOSIS — I341 Nonrheumatic mitral (valve) prolapse: Secondary | ICD-10-CM | POA: Diagnosis not present

## 2023-08-21 DIAGNOSIS — Z7901 Long term (current) use of anticoagulants: Secondary | ICD-10-CM | POA: Diagnosis not present

## 2023-08-27 DIAGNOSIS — I480 Paroxysmal atrial fibrillation: Secondary | ICD-10-CM | POA: Diagnosis not present

## 2023-09-04 DIAGNOSIS — I5032 Chronic diastolic (congestive) heart failure: Secondary | ICD-10-CM | POA: Diagnosis not present

## 2023-09-04 DIAGNOSIS — I480 Paroxysmal atrial fibrillation: Secondary | ICD-10-CM | POA: Diagnosis not present

## 2023-09-04 DIAGNOSIS — I341 Nonrheumatic mitral (valve) prolapse: Secondary | ICD-10-CM | POA: Diagnosis not present

## 2023-09-23 DIAGNOSIS — I480 Paroxysmal atrial fibrillation: Secondary | ICD-10-CM | POA: Diagnosis not present

## 2023-09-29 DIAGNOSIS — R791 Abnormal coagulation profile: Secondary | ICD-10-CM | POA: Diagnosis not present

## 2023-10-10 ENCOUNTER — Ambulatory Visit (INDEPENDENT_AMBULATORY_CARE_PROVIDER_SITE_OTHER): Payer: PPO | Admitting: *Deleted

## 2023-10-10 VITALS — Ht 66.0 in | Wt 161.0 lb

## 2023-10-10 DIAGNOSIS — Z0000 Encounter for general adult medical examination without abnormal findings: Secondary | ICD-10-CM

## 2023-10-10 NOTE — Patient Instructions (Signed)
Carol [name] , Thank you for taking time to come for your Medicare Wellness Visit. I appreciate your ongoing commitment to your health goals. Please review the following plan we discussed and let me know if I can assist you in the future.   Referrals/Orders/Follow-Ups/Clinician Recommendations: Remember to update your vaccines  This is a list of the screening recommended for you and due dates:  Health Maintenance  Topic Date Due   Zoster (Shingles) Vaccine (1 of 2) 09/05/1960   DTaP/Tdap/Td vaccine (2 - Td or Tdap) 11/27/2021   COVID-19 Vaccine (7 - 2023-24 season) 07/06/2023   Flu Shot  02/02/2024*   Medicare Annual Wellness Visit  10/09/2024   Pneumonia Vaccine  Completed   DEXA scan (bone density measurement)  Completed   HPV Vaccine  Aged Out   Colon Cancer Screening  Discontinued   Hepatitis C Screening  Discontinued  *Topic was postponed. The date shown is not the original due date.    Advanced directives: (Copy Requested) Please bring a copy of your health care power of attorney and living will to the office to be added to your chart at your convenience.  Next Medicare Annual Wellness Visit scheduled for next year: Yes 10/11/24 @ 8:50

## 2023-10-10 NOTE — Progress Notes (Cosign Needed Addendum)
Subjective:   Carol [name] is a 82 y.o. female who presents for Medicare Annual (Subsequent) preventive examination.  Visit Complete: Virtual I connected with  Walker Kehr on 10/10/23 by a audio enabled telemedicine application and verified that I am speaking with the correct person using two identifiers.  Patient Location: Home  Provider location Home Office  I discussed the limitations of evaluation and management by telemedicine. The patient expressed understanding and agreed to proceed.  Vital Signs: Because this visit was a virtual/telehealth visit, some criteria may be missing or patient reported. Any vitals not documented were not able to be obtained and vitals that have been documented are patient reported.  ormation answered by patient is correct and no changes since this date.  Cardiac Risk Factors include: advanced age (>69men, >74 women);dyslipidemia;hypertension;Other (see comment), Risk factor comments: AFib     Objective:    Today's Vitals   10/10/23 0815  Weight: 161 lb (73 kg)  Height: 5\' 6"  (1.676 m)   Body mass index is 25.99 kg/m.     10/10/2023    8:41 AM 10/03/2022    8:33 AM 04/25/2022   11:13 AM 04/23/2022   10:43 AM 03/05/2022   10:28 AM 03/04/2022    9:20 PM 10/08/2021    4:00 PM  Advanced Directives  Does Patient Have a Medical Advance Directive? Yes Yes Yes No  No Yes  Type of Estate agent of [place];Living will Living will Healthcare Power of [place];Living will    Living will  Does patient want to make changes to medical advance directive?  No - Patient declined No - Patient declined    No - Patient declined  Copy of Healthcare Power of Attorney in Chart? No - copy requested  No - copy requested      Would patient like information on creating a medical advance directive?   No - Patient declined No - Patient declined No - Patient declined      Current Medications (verified) Outpatient Encounter  Medications as of 10/10/2023  Medication Sig   acetaminophen (TYLENOL) 500 MG tablet Take 1,000 mg by mouth every 6 (six) hours as needed for moderate pain or headache.   amiodarone (PACERONE) 200 MG tablet Take 200 mg by mouth daily.   b complex vitamins capsule Take 1 capsule by mouth daily.   bisacodyl (DULCOLAX) 5 MG EC tablet Take 5 mg by mouth daily as needed for moderate constipation.   Cholecalciferol (VITAMIN D) 50 MCG (2000 UT) tablet Take 2,000 Units by mouth daily.   diltiazem (CARDIZEM LA) 240 MG 24 hr tablet Take 240 mg by mouth daily.   diltiazem (CARDIZEM) 60 MG tablet Take 60 mg by mouth 4 (four) times daily. Takes 1/ 2 daily prn   diphenhydrAMINE (BENADRYL) 25 mg capsule Take 25 mg by mouth every 6 (six) hours as needed for allergies.   docusate sodium (COLACE) 100 MG capsule Take 100 mg by mouth daily as needed for mild constipation.   furosemide (LASIX) 20 MG tablet Take 20 mg by mouth 2 (two) times daily.   gabapentin (NEURONTIN) 100 MG capsule Take 2 capsules (200 mg total) by mouth 2 (two) times daily. Patient taking 100 mg in the morning and 200 mg at night.  Home med.   Glucosamine-Chondroit-Vit C-Mn (GLUCOSAMINE 1500 COMPLEX PO) Take 1 tablet by mouth 2 (two) times daily.   Glycerin-Polysorbate 80 (REFRESH DRY EYE THERAPY OP) Place 1 drop into both eyes 2 (two) times daily  as needed (dry eyes).   guaiFENesin-codeine 100-10 MG/5ML syrup TAKE 10 ML BY MOUTH  TWICE DAILY AS NEEDED FOR COUGH   MAGNESIUM CITRATE PO Take 400 mg by mouth 2 (two) times daily.   Melatonin 5 MG CAPS Take 5 mg by mouth at bedtime as needed (sleep).   metoprolol succinate (TOPROL-XL) 50 MG 24 hr tablet Take 50-100 mg by mouth See admin instructions. Take 100 mg in the morning and 50 mg at night   Multiple Vitamin (MULTIVITAMIN WITH MINERALS) TABS tablet Take 0.5 tablets by mouth 2 (two) times daily.   Omega-3 Fatty Acids (FISH OIL) 1000 MG CAPS Take 2,000 mg by mouth. Takes two by mouth twice a day    omeprazole (PRILOSEC) 20 MG capsule Take 1 capsule (20 mg total) by mouth daily.   rosuvastatin (CRESTOR) 5 MG tablet Take 5 mg by mouth 2 (two) times a week. Mondays and Thursdays   vitamin E 1000 UNIT capsule Take 1,000 Units by mouth 2 (two) times daily.   warfarin (COUMADIN) 4 MG tablet Take 4 mg by mouth See admin instructions. Take 4 mg daily on Monday, Wednesday and Friday   warfarin (COUMADIN) 5 MG tablet Take 5 mg by mouth 2 (two) times a week. Take 5 mg by mouth daily on Sunday, Tuesday, Thursday and Saturday   mupirocin ointment (BACTROBAN) 2 % Apply 1 Application topically 3 (three) times daily. (Patient not taking: Reported on 10/10/2023)   [DISCONTINUED] amoxicillin-clavulanate (AUGMENTIN) 875-125 MG tablet Take 1 tablet by mouth 2 (two) times daily. (Patient not taking: Reported on 10/10/2023)   [DISCONTINUED] diltiazem (CARDIZEM CD) 180 MG 24 hr capsule Take 180 mg by mouth daily. (Patient not taking: Reported on 10/10/2023)   [DISCONTINUED] levalbuterol (XOPENEX HFA) 45 MCG/ACT inhaler Inhale 1-2 puffs into the lungs every 4 (four) hours as needed for wheezing.   [DISCONTINUED] Silver (HYDROGEL AG) GEL Apply daily to open wound. Cleanse with Q-tip. (Patient not taking: Reported on 10/10/2023)   No facility-administered encounter medications on file as of 10/10/2023.    Allergies (verified) Prednisone, Pulmicort [budesonide], Clonidine, Lotemax [loteprednol etabonate], Morphine and codeine, Trovan [alatrofloxacin mesylate], Zelnorm [tegaserod maleate], Ancef [cefazolin], and Erythromycin   History: Past Medical History:  Diagnosis Date   A-fib (HCC)    Chronic sinusitis    COVID-19 virus infection 03/04/2022   Fibrocystic breast disease    History of pneumonia 1999   Hyperlipidemia    Irritable bowel syndrome    Lichen planus    lymphoma 06/2012   Low grade B cell   Mild tricuspid insufficiency 11/2010   ECHO, [place]   Moderate mitral insufficiency 11/2010   ECHO,  [place]   Past Surgical History:  Procedure Laterality Date   ABDOMINAL HYSTERECTOMY     precancerous cervix,     ABLATION OF DYSRHYTHMIC FOCUS  August 2013   [facility], Dr. Maisie Fus   BREAST SURGERY     bilateral, benign biopsies   CARDIOVERSION N/A 11/23/2020   Procedure: CARDIOVERSION;  Surgeon: Lamar Blinks, MD;  Location: [facility];  Service: Cardiovascular;  Laterality: N/A;   CARDIOVERSION N/A 01/17/2021   Procedure: CARDIOVERSION;  Surgeon: Lamar Blinks, MD;  Location: [facility];  Service: Cardiovascular;  Laterality: N/A;   CARDIOVERSION N/A 01/31/2021   Procedure: CARDIOVERSION;  Surgeon: Lamar Blinks, MD;  Location: [facility];  Service: Cardiovascular;  Laterality: N/A;   CARDIOVERSION N/A 10/09/2021   Procedure: CARDIOVERSION;  Surgeon: Lamar Blinks, MD;  Location: [facility];  Service: Cardiovascular;  Laterality: N/A;   CARDIOVERSION N/A 01/17/2022   Procedure: CARDIOVERSION;  Surgeon: Lamar Blinks, MD;  Location: [facility];  Service: Cardiovascular;  Laterality: N/A;   INCISION AND DRAINAGE ABSCESS Right 04/25/2022   Procedure: INCISION AND DRAINAGE ABSCESS;  Surgeon: Campbell Lerner, MD;  Location: [facility];  Service: General;  Laterality: Right;   MAXILLARY SINUS LIFT  1990's   [name]   oophorectomy     squamous cell removal Right    right leg calf area.   TEE WITHOUT CARDIOVERSION N/A 01/31/2021   Procedure: TRANSESOPHAGEAL ECHOCARDIOGRAM (TEE);  Surgeon: Lamar Blinks, MD;  Location: [facility];  Service: Cardiovascular;  Laterality: N/A;   TEE WITHOUT CARDIOVERSION N/A 10/09/2021   Procedure: TRANSESOPHAGEAL ECHOCARDIOGRAM (TEE);  Surgeon: Lamar Blinks, MD;  Location: [facility];  Service: Cardiovascular;  Laterality: N/A;   Family History  Problem Relation Age of Onset   Cancer Mother        Breast   Hyperlipidemia Father    Heart disease Father    Hypertension Father    Kidney disease Father    Cancer Maternal Grandfather        colon CA    Diverticulitis Sister    Social History   Socioeconomic History   Marital status: Married    Spouse name: Wallace Cullens    Number of children: 2   Years of education: Not on file   Highest education level: Not on file  Occupational History   Not on file  Tobacco Use   Smoking status: Never   Smokeless tobacco: Never  Vaping Use   Vaping status: Not on file  Substance and Sexual Activity   Alcohol use: Yes    Alcohol/week: 4.0 standard drinks of alcohol    Types: 4 Glasses of wine per week    Comment: Occ.   Drug use: No   Sexual activity: Not Currently    Birth control/protection: Post-menopausal  Other Topics Concern   Not on file  Social History Narrative   Lives at home with spouse    Social Determinants of Health   Financial Resource Strain: Low Risk  (10/10/2023)   Overall Financial Resource Strain (CARDIA)    Difficulty of Paying Living Expenses: Not hard at all  Food Insecurity: No Food Insecurity (10/10/2023)   Hunger Vital Sign    Worried About Running Out of Food in the Last Year: Never true    Ran Out of Food in the Last Year: Never true  Transportation Needs: No Transportation Needs (10/10/2023)   PRAPARE - Administrator, Civil Service (Medical): No    Lack of Transportation (Non-Medical): No  Physical Activity: Inactive (10/10/2023)   Exercise Vital Sign    Days of Exercise per Week: 0 days    Minutes of Exercise per Session: 0 min  Stress: No Stress Concern Present (10/10/2023)   [employer] of Occupational Health - Occupational Stress Questionnaire    Feeling of Stress : Not at all  Social Connections: Socially Integrated (10/10/2023)   Social Connection and Isolation Panel [NHANES]    Frequency of Communication with Friends and Family: More than three times a week    Frequency of Social Gatherings with Friends and Family: Three times a week    Attends Religious Services: More than 4 times per year    Active Member of Clubs or Organizations:  Yes    Attends Banker Meetings: More than 4 times per year    Marital Status:  Married    Tobacco Counseling Counseling given: Not Answered   Clinical Intake:  Pre-visit preparation completed: Yes  Pain : No/denies pain     BMI - recorded: 25.99 Nutritional Status: BMI 25 -29 Overweight Nutritional Risks: None Diabetes: No  How often do you need to have someone help you when you read instructions, pamphlets, or other written materials from your doctor or pharmacy?: 1 - Never  Interpreter Needed?: No  Information entered by :: R. LawsLPN   Activities of Daily Living    10/10/2023    8:17 AM  In your present state of health, do you have any difficulty performing the following activities:  Hearing? 0  Vision? 0  Comment readers  Difficulty concentrating or making decisions? 0  Walking or climbing stairs? 0  Dressing or bathing? 0  Doing errands, shopping? 0  Preparing Food and eating ? N  Using the Toilet? N  In the past six months, have you accidently leaked urine? N  Do you have problems with loss of bowel control? N  Managing your Medications? N  Managing your Finances? N  Housekeeping or managing your Housekeeping? N    Patient Care Team: Sherlene Shams, MD as PCP - General (Internal Medicine) Lamar Blinks, MD as Referring Physician (Internal Medicine)  Indicate any recent Medical Services you may have received from other than [facility] providers in the past year (date may be approximate).     Assessment:   This is a routine wellness examination for Dorenda.  Hearing/Vision screen Hearing Screening - Comments:: No issues Vision Screening - Comments:: readers   Goals Addressed             This Visit's Progress    Patient Stated       Wants to continue to be active and continue to visit family Wants to do for others       Depression Screen    10/10/2023    8:32 AM 08/04/2023    9:51 AM 12/16/2022   12:04 PM 11/08/2022    9:42 AM  10/03/2022    8:27 AM 09/20/2022    3:17 PM 05/09/2022    8:46 AM  PHQ 2/9 Scores  PHQ - 2 Score 0 0 0 0 1 1 0  PHQ- 9 Score 0          Fall Risk    10/10/2023    8:20 AM 08/04/2023    9:51 AM 12/16/2022   12:04 PM 11/08/2022    9:41 AM 10/03/2022    8:36 AM  Fall Risk   Falls in the past year? 1 0 0 0 0  Number falls in past yr: 0 0 0  0  Injury with Fall? 0 0 0  0  Risk for fall due to : History of fall(s);Impaired balance/gait No Fall Risks No Fall Risks No Fall Risks No Fall Risks  Follow up Falls evaluation completed;Falls prevention discussed Falls evaluation completed Falls evaluation completed Falls evaluation completed Falls evaluation completed;Falls prevention discussed    MEDICARE RISK AT HOME: Medicare Risk at Home Any stairs in or around the home?: Yes If so, are there any without handrails?: No Home free of loose throw rugs in walkways, pet beds, electrical cords, etc?: Yes Adequate lighting in your home to reduce risk of falls?: Yes Life alert?: No Use of a cane, walker or w/c?: No Grab bars in the bathroom?: No Shower chair or bench in shower?: No Elevated toilet seat or a handicapped toilet?:  Yes   Cognitive Function:    12/26/2016    9:31 AM 11/27/2015   10:51 AM  MMSE - Mini Mental State Exam  Orientation to time 5 5  Orientation to Place 5 5  Registration 3 3  Attention/ Calculation 4 5  Recall 2 3  Language- name 2 objects 2 2  Language- repeat 1 1  Language- follow 3 step command 3 3  Language- read & follow direction 1 1  Write a sentence 1 1  Copy design 1 1  Total score 28 30        10/10/2023    8:41 AM 10/03/2022    8:44 AM 09/20/2021    2:33 PM 08/15/2020   10:53 AM 04/16/2019   10:58 AM  6CIT Screen  What Year? 0 points 0 points 0 points 0 points 0 points  What month? 0 points 0 points 0 points 0 points 0 points  What time? 0 points 0 points 0 points 0 points 0 points  Count back from 20 0 points 0 points 0 points  0 points   Months in reverse 0 points 0 points 0 points  0 points  Repeat phrase 0 points 0 points 0 points    Total Score 0 points 0 points 0 points      Immunizations Immunization History  Administered Date(s) Administered   Fluad Quad(high Dose 65+) 10/11/2020, 11/08/2022   Influenza, High Dose Seasonal PF 09/26/2016   Influenza, Seasonal, Injecte, Preservative Fre 11/24/2012   Influenza,inj,Quad PF,6+ Mos 08/24/2013, 09/15/2014, 08/30/2015, 09/15/2017, 09/09/2018   Influenza-Unspecified 08/04/2013, 08/07/2014, 09/19/2015, 08/10/2019   PFIZER Comirnaty(Gray Top)Covid-19 Tri-Sucrose Vaccine 11/30/2019, 12/21/2019, 08/23/2020   PFIZER(Purple Top)SARS-COV-2 Vaccination 11/30/2019, 12/21/2019, 08/23/2020   Pneumococcal Conjugate-13 11/27/2015   Pneumococcal Polysaccharide-23 11/05/2013, 04/05/2020   Tdap 11/28/2011   Zoster, Live 05/17/2008    TDAP status: Due, Education has been provided regarding the importance of this vaccine. Advised may receive this vaccine at local pharmacy or Health Dept. Aware to provide a copy of the vaccination record if obtained from local pharmacy or Health Dept. Verbalized acceptance and understanding.  Flu Vaccine status: Due, Education has been provided regarding the importance of this vaccine. Advised may receive this vaccine at local pharmacy or Health Dept. Aware to provide a copy of the vaccination record if obtained from local pharmacy or Health Dept. Verbalized acceptance and understanding.  Pneumococcal vaccine status Up to date  Covid-19 vaccine status: Declined, Education has been provided regarding the importance of this vaccine but patient still declined. Advised may receive this vaccine at local pharmacy or Health Dept.or vaccine clinic. Aware to provide a copy of the vaccination record if obtained from local pharmacy or Health Dept. Verbalized acceptance and understanding.   Qualifies for Shingles Vaccine? Yes   Zostavax completed Yes   Shingrix  Completed?: No.    Education has been provided regarding the importance of this vaccine. Patient has been advised to call insurance company to determine out of pocket expense if they have not yet received this vaccine. Advised may also receive vaccine at local pharmacy or Health Dept. Verbalized acceptance and understanding.  Screening Tests Health Maintenance  Topic Date Due   Zoster Vaccines- Shingrix (1 of 2) 09/05/1960   DTaP/Tdap/Td (2 - Td or Tdap) 11/27/2021   COVID-19 Vaccine (7 - 2023-24 season) 07/06/2023   Medicare Annual Wellness (AWV)  10/04/2023   INFLUENZA VACCINE  02/02/2024 (Originally 06/05/2023)   Pneumonia Vaccine 49+ Years old  Completed   DEXA SCAN  Completed   HPV VACCINES  Aged Out   Colonoscopy  Discontinued   Hepatitis C Screening  Discontinued    Health Maintenance  Health Maintenance Due  Topic Date Due   Zoster Vaccines- Shingrix (1 of 2) 09/05/1960   DTaP/Tdap/Td (2 - Td or Tdap) 11/27/2021   COVID-19 Vaccine (7 - 2023-24 season) 07/06/2023   Medicare Annual Wellness (AWV)  10/04/2023    Colorectal cancer screening: No longer required.   Mammogram status: No longer required due to age.  Bone Density status: Completed 07/2017. Results reflect: Bone density results: NORMAL. Repeat every 2 years.  Lung Cancer Screening: (Low Dose CT Chest recommended if Age 34-80 years, 20 pack-year currently smoking OR have quit w/in 15years.) does not qualify.     Additional Screening:  Hepatitis C Screening: does qualify; Completed 11/2015  Vision Screening: Recommended annual ophthalmology exams for early detection of glaucoma and other disorders of the eye. Is the patient up to date with their annual eye exam?  Yes  Who is the provider or what is the name of the office in which the patient attends annual eye exams?  Eye If pt is not established with a provider, would they like to be referred to a provider to establish care? No .   Dental Screening:  Recommended annual dental exams for proper oral hygiene    Community Resource Referral / Chronic Care Management: CRR required this visit?  No   CCM required this visit?  No     Plan:     I have personally reviewed and noted the following in the patient's chart:   Medical and social history Use of alcohol, tobacco or illicit drugs  Current medications and supplements including opioid prescriptions. Patient is not currently taking opioid prescriptions. Functional ability and status Nutritional status Physical activity Advanced directives List of other physicians Hospitalizations, surgeries, and ER visits in previous 12 months Vitals Screenings to include cognitive, depression, and falls Referrals and appointments  In addition, I have reviewed and discussed with patient certain preventive protocols, quality metrics, and best practice recommendations. A written personalized care plan for preventive services as well as general preventive health recommendations were provided to patient.     Sydell Axon, LPN   01/06/7424   After Visit Summary: (MyChart) Due to this being a telephonic visit, the after visit summary with patients personalized plan was offered to patient via MyChart   Nurse Notes: None

## 2023-11-06 DIAGNOSIS — H02403 Unspecified ptosis of bilateral eyelids: Secondary | ICD-10-CM | POA: Diagnosis not present

## 2023-11-13 DIAGNOSIS — H02403 Unspecified ptosis of bilateral eyelids: Secondary | ICD-10-CM | POA: Diagnosis not present

## 2023-11-13 DIAGNOSIS — H02831 Dermatochalasis of right upper eyelid: Secondary | ICD-10-CM | POA: Diagnosis not present

## 2023-11-14 DIAGNOSIS — Z7901 Long term (current) use of anticoagulants: Secondary | ICD-10-CM | POA: Diagnosis not present

## 2023-11-14 DIAGNOSIS — I480 Paroxysmal atrial fibrillation: Secondary | ICD-10-CM | POA: Diagnosis not present

## 2023-12-12 DIAGNOSIS — I480 Paroxysmal atrial fibrillation: Secondary | ICD-10-CM | POA: Diagnosis not present

## 2023-12-12 DIAGNOSIS — Z7901 Long term (current) use of anticoagulants: Secondary | ICD-10-CM | POA: Diagnosis not present

## 2024-01-09 DIAGNOSIS — I480 Paroxysmal atrial fibrillation: Secondary | ICD-10-CM | POA: Diagnosis not present

## 2024-01-09 DIAGNOSIS — Z7901 Long term (current) use of anticoagulants: Secondary | ICD-10-CM | POA: Diagnosis not present

## 2024-01-15 DIAGNOSIS — H02411 Mechanical ptosis of right eyelid: Secondary | ICD-10-CM | POA: Diagnosis not present

## 2024-01-22 DIAGNOSIS — Z85828 Personal history of other malignant neoplasm of skin: Secondary | ICD-10-CM | POA: Diagnosis not present

## 2024-01-22 DIAGNOSIS — D2261 Melanocytic nevi of right upper limb, including shoulder: Secondary | ICD-10-CM | POA: Diagnosis not present

## 2024-01-22 DIAGNOSIS — D225 Melanocytic nevi of trunk: Secondary | ICD-10-CM | POA: Diagnosis not present

## 2024-01-22 DIAGNOSIS — D2272 Melanocytic nevi of left lower limb, including hip: Secondary | ICD-10-CM | POA: Diagnosis not present

## 2024-01-22 DIAGNOSIS — Z85820 Personal history of malignant melanoma of skin: Secondary | ICD-10-CM | POA: Diagnosis not present

## 2024-01-22 DIAGNOSIS — L570 Actinic keratosis: Secondary | ICD-10-CM | POA: Diagnosis not present

## 2024-01-22 DIAGNOSIS — D2262 Melanocytic nevi of left upper limb, including shoulder: Secondary | ICD-10-CM | POA: Diagnosis not present

## 2024-01-22 DIAGNOSIS — Z86006 Personal history of melanoma in-situ: Secondary | ICD-10-CM | POA: Diagnosis not present

## 2024-01-23 DIAGNOSIS — Z7901 Long term (current) use of anticoagulants: Secondary | ICD-10-CM | POA: Diagnosis not present

## 2024-01-23 DIAGNOSIS — I480 Paroxysmal atrial fibrillation: Secondary | ICD-10-CM | POA: Diagnosis not present

## 2024-01-28 DIAGNOSIS — Z7901 Long term (current) use of anticoagulants: Secondary | ICD-10-CM | POA: Diagnosis not present

## 2024-01-28 DIAGNOSIS — I480 Paroxysmal atrial fibrillation: Secondary | ICD-10-CM | POA: Diagnosis not present

## 2024-02-02 ENCOUNTER — Ambulatory Visit: Payer: Medicare HMO | Admitting: Internal Medicine

## 2024-02-03 ENCOUNTER — Ambulatory Visit (INDEPENDENT_AMBULATORY_CARE_PROVIDER_SITE_OTHER): Payer: Medicare HMO | Admitting: Internal Medicine

## 2024-02-03 ENCOUNTER — Encounter: Payer: Self-pay | Admitting: Internal Medicine

## 2024-02-03 VITALS — BP 138/70 | HR 53 | Ht 66.0 in | Wt 167.2 lb

## 2024-02-03 DIAGNOSIS — I480 Paroxysmal atrial fibrillation: Secondary | ICD-10-CM

## 2024-02-03 DIAGNOSIS — I482 Chronic atrial fibrillation, unspecified: Secondary | ICD-10-CM

## 2024-02-03 DIAGNOSIS — I420 Dilated cardiomyopathy: Secondary | ICD-10-CM

## 2024-02-03 DIAGNOSIS — N183 Chronic kidney disease, stage 3 unspecified: Secondary | ICD-10-CM | POA: Diagnosis not present

## 2024-02-03 DIAGNOSIS — E785 Hyperlipidemia, unspecified: Secondary | ICD-10-CM

## 2024-02-03 DIAGNOSIS — Z0000 Encounter for general adult medical examination without abnormal findings: Secondary | ICD-10-CM | POA: Diagnosis not present

## 2024-02-03 DIAGNOSIS — H6993 Unspecified Eustachian tube disorder, bilateral: Secondary | ICD-10-CM | POA: Diagnosis not present

## 2024-02-03 DIAGNOSIS — Z7901 Long term (current) use of anticoagulants: Secondary | ICD-10-CM

## 2024-02-03 DIAGNOSIS — H6990 Unspecified Eustachian tube disorder, unspecified ear: Secondary | ICD-10-CM | POA: Insufficient documentation

## 2024-02-03 DIAGNOSIS — I10 Essential (primary) hypertension: Secondary | ICD-10-CM | POA: Diagnosis not present

## 2024-02-03 LAB — CBC WITH DIFFERENTIAL/PLATELET
Basophils Absolute: 0 10*3/uL (ref 0.0–0.1)
Basophils Relative: 0.7 % (ref 0.0–3.0)
Eosinophils Absolute: 0.1 10*3/uL (ref 0.0–0.7)
Eosinophils Relative: 1.3 % (ref 0.0–5.0)
HCT: 40 % (ref 36.0–46.0)
Hemoglobin: 13.5 g/dL (ref 12.0–15.0)
Lymphocytes Relative: 31.3 % (ref 12.0–46.0)
Lymphs Abs: 2.2 10*3/uL (ref 0.7–4.0)
MCHC: 33.7 g/dL (ref 30.0–36.0)
MCV: 98.1 fl (ref 78.0–100.0)
Monocytes Absolute: 0.6 10*3/uL (ref 0.1–1.0)
Monocytes Relative: 9.3 % (ref 3.0–12.0)
Neutro Abs: 4 10*3/uL (ref 1.4–7.7)
Neutrophils Relative %: 57.4 % (ref 43.0–77.0)
Platelets: 354 10*3/uL (ref 150.0–400.0)
RBC: 4.08 Mil/uL (ref 3.87–5.11)
RDW: 14.1 % (ref 11.5–15.5)
WBC: 6.9 10*3/uL (ref 4.0–10.5)

## 2024-02-03 LAB — LDL CHOLESTEROL, DIRECT: Direct LDL: 111 mg/dL

## 2024-02-03 LAB — COMPREHENSIVE METABOLIC PANEL WITH GFR
ALT: 20 U/L (ref 0–35)
AST: 22 U/L (ref 0–37)
Albumin: 4.2 g/dL (ref 3.5–5.2)
Alkaline Phosphatase: 51 U/L (ref 39–117)
BUN: 16 mg/dL (ref 6–23)
CO2: 27 meq/L (ref 19–32)
Calcium: 9.3 mg/dL (ref 8.4–10.5)
Chloride: 97 meq/L (ref 96–112)
Creatinine, Ser: 0.94 mg/dL (ref 0.40–1.20)
GFR: 56.15 mL/min — ABNORMAL LOW (ref >60.00)
Glucose, Bld: 96 mg/dL (ref 70–99)
Potassium: 4.4 meq/L (ref 3.5–5.1)
Sodium: 132 meq/L — ABNORMAL LOW (ref 135–145)
Total Bilirubin: 0.7 mg/dL (ref 0.2–1.2)
Total Protein: 7 g/dL (ref 6.0–8.3)

## 2024-02-03 LAB — LIPID PANEL
Cholesterol: 186 mg/dL (ref 0–200)
HDL: 53.8 mg/dL (ref >39.00)
LDL Cholesterol: 94 mg/dL (ref 0–99)
NonHDL: 132.18
Total CHOL/HDL Ratio: 3
Triglycerides: 189 mg/dL — ABNORMAL HIGH (ref 0.0–149.0)
VLDL: 37.8 mg/dL (ref 0.0–40.0)

## 2024-02-03 LAB — MICROALBUMIN / CREATININE URINE RATIO
Creatinine,U: 35.3 mg/dL
Microalb Creat Ratio: UNDETERMINED mg/g (ref 0.0–30.0)
Microalb, Ur: -0.1 mg/dL — ABNORMAL LOW (ref 0.0–1.9)

## 2024-02-03 NOTE — Assessment & Plan Note (Signed)
 Secondary to chronic sinus congestion.  Steroid and anthistamine nasal sprays advised

## 2024-02-03 NOTE — Assessment & Plan Note (Signed)
 EF imporved to > 55% by Oct 2024 ECHO ([facility])

## 2024-02-03 NOTE — Progress Notes (Signed)
 Patient ID: Carol [name], female    DOB: 03/08/1941  Age: 83 y.o. MRN: [id number]  The patient is here for annual preventive examination and management of other chronic and acute problems.   The risk factors are reflected in the social history.   The roster of all physicians providing medical care to patient - is listed in the Snapshot section of the chart.   Activities of daily living:  The patient is 100% independent in all ADLs: dressing, toileting, feeding as well as independent mobility   Home safety : The patient has smoke detectors in the home. They wear seatbelts.  There are no unsecured firearms at home. There is no violence in the home.    There is no risks for hepatitis, STDs or HIV. There is no   history of blood transfusion. They have no travel history to infectious disease endemic areas of the world.   The patient has seen their dentist in the last six month. They have seen their eye doctor in the last year. The patinet  denies slight hearing difficulty with regard to whispered voices and some television programs.  They have deferred audiologic testing in the last year.  They do not  have excessive sun exposure. Discussed the need for sun protection: hats, long sleeves and use of sunscreen if there is significant sun exposure.    Diet: the importance of a healthy diet is discussed. They do have a healthy diet.   The benefits of regular aerobic exercise were discussed. The patient  exercises  3 to 5 days per week  for  60 minutes.    Depression screen: there are no signs or vegative symptoms of depression- irritability, change in appetite, anhedonia, sadness/tearfullness.   The following portions of the patient's history were reviewed and updated as appropriate: allergies, current medications, past family history, past medical history,  past surgical history, past social history  and problem list.   Visual acuity was not assessed per patient preference since the patient  has regular follow up with an  ophthalmologist. Hearing and body mass index were assessed and reviewed.    During the course of the visit the patient was educated and counseled about appropriate screening and preventive services including : fall prevention , diabetes screening, nutrition counseling, colorectal cancer screening, and recommended immunizations.    Chief Complaint:  )  Ptosis: Recently Had correction of ptosis right eyelid recently by ophthalmology . Did not take the prescribed abx due to allergies to mycins but used the topical steroid. . Painful in -office procedure.  All bruising resolved.  No complications   2) HTN:  Patient is taking her medications as prescribed (for PAF) and notes no adverse effects.    3)  PAF:  Takes 150 mg metoprolol qam and 50 mg at bedtime,  diltiazem XR   240 mg daily amiodarone 200 mg oncw daily for PAF.  Home BP readings have been done about once per month and are  generally < 130/80 .  She is avoiding added salt in her diet and walking regularly about 3 times per week for exercise  . Coumadin managed by monthly INR clinic ; she has been offered a DOAC but declines bc of  conversations with her son who is a pharmacist   Cc:  feeling dizzy and light headed   without palpitations or elevated HR .  Feels the symptoms are allergy related bc her sinuses are congested.  Took dramamine for the dizziness. Now taking benadryl  Review of Symptoms  Patient denies headache, fevers, malaise, unintentional weight loss, skin rash, eye pain, sinus congestion and sinus pain, sore throat, dysphagia,  hemoptysis , cough, dyspnea, wheezing, chest pain, palpitations, orthopnea, edema, abdominal pain, nausea, melena, diarrhea, constipation, flank pain, dysuria, hematuria, urinary  Frequency, nocturia, numbness, tingling, seizures,  Focal weakness, Loss of consciousness,  Tremor, insomnia, depression, anxiety, and suicidal ideation.    Physical Exam:  BP 138/70   Pulse (!)  53   Ht 5\' 6"  (1.676 m)   Wt 167 lb 3.2 oz (75.8 kg)   SpO2 98%   BMI 26.99 kg/m    Physical Exam Vitals reviewed.  Constitutional:      General: She is not in acute distress.    Appearance: Normal appearance. She is normal weight. She is not ill-appearing, toxic-appearing or diaphoretic.  HENT:     Head: Normocephalic.  Eyes:     General: No scleral icterus.       Right eye: No discharge.        Left eye: No discharge.     Conjunctiva/sclera: Conjunctivae normal.  Cardiovascular:     Rate and Rhythm: Normal rate and regular rhythm.     Heart sounds: Normal heart sounds.  Pulmonary:     Effort: Pulmonary effort is normal. No respiratory distress.     Breath sounds: Normal breath sounds.  Musculoskeletal:        General: Normal range of motion.  Skin:    General: Skin is warm and dry.  Neurological:     General: No focal deficit present.     Mental Status: She is alert and oriented to person, place, and time. Mental status is at baseline.  Psychiatric:        Mood and Affect: Mood normal.        Behavior: Behavior normal.        Thought Content: Thought content normal.        Judgment: Judgment normal.     Assessment and Plan: Benign essential HTN -     Microalbumin / creatinine urine ratio -     Comprehensive metabolic panel with GFR  Hyperlipidemia, unspecified hyperlipidemia type -     Lipid panel -     LDL cholesterol, direct  Long term current use of anticoagulant -     CBC with Differential/Platelet  Paroxysmal atrial fibrillation (HCC)  Cardiomyopathy, dilated, nonischemic (HCC) Assessment & Plan: EF imporved to > 55% by Oct 2024 ECHO ([facility])   Dysfunction of both eustachian tubes Assessment & Plan: Secondary to chronic sinus congestion.  Steroid and anthistamine nasal sprays advised     Stage 3 chronic kidney disease, unspecified whether stage 3a or 3b CKD (HCC) Assessment & Plan: Her previously noted decline in GFR was transiently  aggravated by chronic diuretic use and has resolved.   (still using 40 mg lasix daily in divided doses).    She is avoiding NSAIDS due to chronic anticoagulation   Lab Results  Component Value Date   CREATININE 0.94 02/03/2024   Lab Results  Component Value Date   NA 132 (L) 02/03/2024   K 4.4 02/03/2024   CL 97 02/03/2024   CO2 27 02/03/2024      Encounter for preventive health examination Assessment & Plan: age appropriate education and counseling updated, referrals for preventative services and immunizations addressed, dietary and smoking counseling addressed, most recent labs reviewed.  I have personally reviewed and have noted:   1) the patient's medical and social  history 2) The pt's use of alcohol, tobacco, and illicit drugs 3) The patient's current medications and supplements 4) Functional ability including ADL's, fall risk, home safety risk, hearing and visual impairment 5) Diet and physical activities 6) Evidence for depression or mood disorder 7) The patient's height, weight, and BMI have been recorded in the chart   I have made referrals, and provided counseling and education based on review of the above    Chronic atrial fibrillation (HCC) Assessment & Plan: Managed with  150 mg metoprolol qam and 50 mg at bedtime,  diltiazem XR   240 mg daily amiodarone 200 mg oncw daily for PAF. Coumadin managed by monthly INR clinic ; she has been offered a DOAC but declines bc of  conversations with her son who is a pharmacist a      Return in about 6 months (around 08/04/2024).  Sherlene Shams, MD

## 2024-02-03 NOTE — Assessment & Plan Note (Signed)

## 2024-02-03 NOTE — Assessment & Plan Note (Signed)
 Managed with  150 mg metoprolol qam and 50 mg at bedtime,  diltiazem XR   240 mg daily amiodarone 200 mg oncw daily for PAF. Coumadin managed by monthly INR clinic ; she has been offered a DOAC but declines bc of  conversations with her son who is a Teacher, early years/pre a

## 2024-02-03 NOTE — Progress Notes (Deleted)
 Subjective:  Patient ID: Carol [name], female    DOB: [birth year]  Age: 83 y.o. MRN: [id number]  CC: The primary encounter diagnosis was Benign essential HTN. Diagnoses of Hyperlipidemia, unspecified hyperlipidemia type, Long term current use of anticoagulant, Paroxysmal atrial fibrillation (HCC), and Cardiomyopathy, dilated, nonischemic (HCC) were also pertinent to this visit.   HPI Carol [name] presents for  Chief Complaint  Patient presents with  . Medical Management of Chronic Issues    6 month follow up   1)  Ptosis: Recently Had correction of ptosis right eyelid recently by ophthalmology . Did not take the prescribed abx due to allergies to mycins but used the topical steroid. . Painful in -office procedure.  All bruising resolved.  No complications   2) HTN:  Patient is taking her medications as prescribed (for PAF) and notes no adverse effects.    3)  PAF:  Takes 150 mg metoprolol qam and 50 mg at bedtime,  diltiazem XR   240 mg daily amiodarone 200 mg oncw daily for PAF.  Home BP readings have been done about once per month and are  generally < 130/80 .  She is avoiding added salt in her diet and walking regularly about 3 times per week for exercise  . Coumadin managed by monthly INR clinic ; she has been offered a DOAC but declines bc of  conversations with her son who is a pharmacist a   Cc:  feeling dizzy and light headed   without palpitations or elevated HR .  Feels the symptoms are allergy related bc her sinuses are congested.  Took dramamine for the dizziness. Now taking benadryl     Outpatient Medications Prior to Visit  Medication Sig Dispense Refill  . acetaminophen (TYLENOL) 500 MG tablet Take 1,000 mg by mouth every 6 (six) hours as needed for moderate pain or headache.    Marland Kitchen amiodarone (PACERONE) 200 MG tablet Take 200 mg by mouth daily.    Marland Kitchen b complex vitamins capsule Take 1 capsule by mouth daily.    . bisacodyl (DULCOLAX) 5 MG EC tablet  Take 5 mg by mouth daily as needed for moderate constipation.    . Cholecalciferol (VITAMIN D) 50 MCG (2000 UT) tablet Take 2,000 Units by mouth daily.    Marland Kitchen diltiazem (CARDIZEM LA) 240 MG 24 hr tablet Take 240 mg by mouth daily.    Marland Kitchen diltiazem (CARDIZEM) 60 MG tablet Take 60 mg by mouth 4 (four) times daily. Takes 1/ 2 daily prn    . diphenhydrAMINE (BENADRYL) 25 mg capsule Take 25 mg by mouth every 6 (six) hours as needed for allergies.    Marland Kitchen docusate sodium (COLACE) 100 MG capsule Take 100 mg by mouth daily as needed for mild constipation.    . furosemide (LASIX) 20 MG tablet Take 20 mg by mouth 2 (two) times daily.    Marland Kitchen gabapentin (NEURONTIN) 100 MG capsule Take 2 capsules (200 mg total) by mouth 2 (two) times daily. Patient taking 100 mg in the morning and 200 mg at night.  Home med. 360 capsule 1  . Glucosamine-Chondroit-Vit C-Mn (GLUCOSAMINE 1500 COMPLEX PO) Take 1 tablet by mouth 2 (two) times daily.    . Glycerin-Polysorbate 80 (REFRESH DRY EYE THERAPY OP) Place 1 drop into both eyes 2 (two) times daily as needed (dry eyes).    Marland Kitchen guaiFENesin-codeine 100-10 MG/5ML syrup TAKE 10 ML BY MOUTH  TWICE DAILY AS NEEDED FOR COUGH 140 mL 0  . MAGNESIUM  CITRATE PO Take 400 mg by mouth 2 (two) times daily.    . Melatonin 5 MG CAPS Take 5 mg by mouth at bedtime as needed (sleep).    . metoprolol succinate (TOPROL-XL) 50 MG 24 hr tablet Take 50-100 mg by mouth See admin instructions. Take 100 mg in the morning and 50 mg at night    . Multiple Vitamin (MULTIVITAMIN WITH MINERALS) TABS tablet Take 0.5 tablets by mouth 2 (two) times daily.    . mupirocin ointment (BACTROBAN) 2 % Apply 1 Application topically 3 (three) times daily.    . Omega-3 Fatty Acids (FISH OIL) 1000 MG CAPS Take 2,000 mg by mouth. Takes two by mouth twice a day    . omeprazole (PRILOSEC) 20 MG capsule Take 1 capsule (20 mg total) by mouth daily. 90 capsule 3  . rosuvastatin (CRESTOR) 5 MG tablet Take 5 mg by mouth 2 (two) times a week.  Mondays and Thursdays    . vitamin E 1000 UNIT capsule Take 1,000 Units by mouth 2 (two) times daily.    Marland Kitchen warfarin (COUMADIN) 4 MG tablet Take 4 mg by mouth See admin instructions. Take 4 mg daily on Monday, Wednesday and Friday    . warfarin (COUMADIN) 5 MG tablet Take 5 mg by mouth 2 (two) times a week. Take 5 mg by mouth daily on Sunday, Tuesday, Thursday and Saturday     No facility-administered medications prior to visit.    Review of Systems;  Patient denies headache, fevers, malaise, unintentional weight loss, skin rash, eye pain, sinus congestion and sinus pain, sore throat, dysphagia,  hemoptysis , cough, dyspnea, wheezing, chest pain, palpitations, orthopnea, edema, abdominal pain, nausea, melena, diarrhea, constipation, flank pain, dysuria, hematuria, urinary  Frequency, nocturia, numbness, tingling, seizures,  Focal weakness, Loss of consciousness,  Tremor, insomnia, depression, anxiety, and suicidal ideation.      Objective:  BP 138/70   Pulse (!) 53   Ht 5\' 6"  (1.676 m)   Wt 167 lb 3.2 oz (75.8 kg)   SpO2 98%   BMI 26.99 kg/m   BP Readings from Last 3 Encounters:  02/03/24 138/70  08/04/23 128/74  12/16/22 118/80    Wt Readings from Last 3 Encounters:  02/03/24 167 lb 3.2 oz (75.8 kg)  10/10/23 161 lb (73 kg)  08/04/23 166 lb 6.4 oz (75.5 kg)    Physical Exam Vitals reviewed.  Constitutional:      General: She is not in acute distress.    Appearance: Normal appearance. She is normal weight. She is not ill-appearing, toxic-appearing or diaphoretic.  HENT:     Head: Normocephalic.  Eyes:     General: No scleral icterus.       Right eye: No discharge.        Left eye: No discharge.     Conjunctiva/sclera: Conjunctivae normal.  Cardiovascular:     Rate and Rhythm: Normal rate and regular rhythm.     Heart sounds: Normal heart sounds.  Pulmonary:     Effort: Pulmonary effort is normal. No respiratory distress.     Breath sounds: Normal breath sounds.   Musculoskeletal:        General: Normal range of motion.  Skin:    General: Skin is warm and dry.  Neurological:     General: No focal deficit present.     Mental Status: She is alert and oriented to person, place, and time. Mental status is at baseline.  Psychiatric:  Mood and Affect: Mood normal.        Behavior: Behavior normal.        Thought Content: Thought content normal.        Judgment: Judgment normal.   Lab Results  Component Value Date   HGBA1C 5.7 08/04/2023   HGBA1C 5.7 (H) 10/08/2021   HGBA1C 5.7 (H) 10/07/2021    Lab Results  Component Value Date   CREATININE 0.85 08/04/2023   CREATININE 0.82 11/08/2022   CREATININE 1.05 09/11/2022    Lab Results  Component Value Date   WBC 7.1 08/04/2023   HGB 13.2 08/04/2023   HCT 39.7 08/04/2023   PLT 312.0 08/04/2023   GLUCOSE 108 (H) 08/04/2023   CHOL 218 (H) 08/04/2023   TRIG 181.0 (H) 08/04/2023   HDL 53.50 08/04/2023   LDLDIRECT 132.0 08/04/2023   LDLCALC 128 (H) 08/04/2023   ALT 22 08/04/2023   AST 23 08/04/2023   NA 139 08/04/2023   K 4.0 08/04/2023   CL 102 08/04/2023   CREATININE 0.85 08/04/2023   BUN 11 08/04/2023   CO2 28 08/04/2023   TSH 2.35 08/04/2023   INR 3.7 (H) 12/19/2022   HGBA1C 5.7 08/04/2023   MICROALBUR <0.7 08/04/2023    US BREAST LTD UNI RIGHT INC AXILLA Result Date: 04/23/2022 CLINICAL DATA:  83 year old presenting with burning pain in the RIGHT breast. 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 She presents now with worsening pain. She was given a prescription for Augmentin 3 days ago. Personal history of malignant RIGHT breast lumpectomy in July, 2015 at [facility] EXAM: ULTRASOUND OF THE RIGHT BREAST COMPARISON:  RIGHT breast ultrasound 05/18/2014 at the time of breast cancer diagnosis. No interval breast imaging at [facility] ([place]). She  states that she receives her breast imaging at [facility]. FINDINGS: Targeted ultrasound is performed, showing a heterogeneous mixed solid and fluid collection in the superficial tissues at the 11:30 o'clock position 6 cm from the nipple measuring approximately 2.6 x 1.6 x 2.2 cm, demonstrating posterior acoustic enhancement and mild hyperemia on power Doppler flow. There is skin thickening/edema and there is edema in the surrounding breast tissues. IMPRESSION: Approximate 2.6 cm phlegmon in the superficial tissues of the RIGHT breast at the 11:30 o'clock position 6 cm from the nipple. This has not yet liquified into an abscess and is not a drainable collection. RECOMMENDATION: 1. Antibiotic therapy. 2. If symptoms persist, follow-up RIGHT breast ultrasound in 2 weeks after completion of antibiotic therapy. 3. Annual mammography for which the patient currently goes to [facility]. BI-RADS CATEGORY  2: Benign. Electronically Signed   By: Hulan Saas M.D.   On: 04/23/2022 13:28    Assessment & Plan:  .Benign essential HTN -     Microalbumin / creatinine urine ratio -     Comprehensive metabolic panel with GFR  Hyperlipidemia, unspecified hyperlipidemia type -     Lipid panel -     LDL cholesterol, direct  Long term current use of anticoagulant -     CBC with Differential/Platelet  Paroxysmal atrial fibrillation (HCC)  Cardiomyopathy, dilated, nonischemic (HCC) Assessment & Plan: EF imporved to > 55% by Oct 2024 ECHO ([facility])      I spent 34 minutes on the day of this face to face encounter reviewing patient's  most recent visit with cardiology,  nephrology,  and neurology,  prior relevant surgical and non  surgical procedures, recent  labs and imaging studies, counseling on weight management,  reviewing the assessment and plan with patient, and post visit ordering and reviewing of  diagnostics and therapeutics with patient  .   Follow-up: No follow-ups on file.   Sherlene Shams, MD

## 2024-02-03 NOTE — Assessment & Plan Note (Signed)
 Her previously noted decline in GFR was transiently aggravated by chronic diuretic use and has resolved.   (still using 40 mg lasix daily in divided doses).    She is avoiding NSAIDS due to chronic anticoagulation   Lab Results  Component Value Date   CREATININE 0.94 02/03/2024   Lab Results  Component Value Date   NA 132 (L) 02/03/2024   K 4.4 02/03/2024   CL 97 02/03/2024   CO2 27 02/03/2024

## 2024-02-03 NOTE — Patient Instructions (Signed)
 For your stuffiness and dizzines:  Alternate between Flonase  (fluticasone  is the generic name, steroid nasal spray ) and Astepro (azelastine is the generic name and it's an antihistamine nasal spray ) daily as your nasal sprays for your  allergies   AND:  You can use Benadryl but you should also consider substituting one of these newer second generation antihistamines that are longer acting, non sedating and  available OTC:  Generic  Zyrtec, which is cetirizine.    generic Allegra , available generically as fexofenadine ; comes in 60 mg and 180 mg once daily strengths.    Generic Claritin :  also available as loratidine .

## 2024-02-05 ENCOUNTER — Encounter: Payer: Self-pay | Admitting: Internal Medicine

## 2024-02-27 DIAGNOSIS — E782 Mixed hyperlipidemia: Secondary | ICD-10-CM | POA: Diagnosis not present

## 2024-02-27 DIAGNOSIS — I493 Ventricular premature depolarization: Secondary | ICD-10-CM | POA: Diagnosis not present

## 2024-02-27 DIAGNOSIS — I341 Nonrheumatic mitral (valve) prolapse: Secondary | ICD-10-CM | POA: Diagnosis not present

## 2024-02-27 DIAGNOSIS — I5032 Chronic diastolic (congestive) heart failure: Secondary | ICD-10-CM | POA: Diagnosis not present

## 2024-02-27 DIAGNOSIS — I10 Essential (primary) hypertension: Secondary | ICD-10-CM | POA: Diagnosis not present

## 2024-02-27 DIAGNOSIS — Z79899 Other long term (current) drug therapy: Secondary | ICD-10-CM | POA: Diagnosis not present

## 2024-02-27 DIAGNOSIS — I480 Paroxysmal atrial fibrillation: Secondary | ICD-10-CM | POA: Diagnosis not present

## 2024-03-15 ENCOUNTER — Other Ambulatory Visit: Payer: Self-pay | Admitting: Internal Medicine

## 2024-03-26 DIAGNOSIS — I480 Paroxysmal atrial fibrillation: Secondary | ICD-10-CM | POA: Diagnosis not present

## 2024-03-26 DIAGNOSIS — Z7901 Long term (current) use of anticoagulants: Secondary | ICD-10-CM | POA: Diagnosis not present

## 2024-03-31 DIAGNOSIS — I480 Paroxysmal atrial fibrillation: Secondary | ICD-10-CM | POA: Diagnosis not present

## 2024-03-31 DIAGNOSIS — Z7901 Long term (current) use of anticoagulants: Secondary | ICD-10-CM | POA: Diagnosis not present

## 2024-04-13 DIAGNOSIS — Z7901 Long term (current) use of anticoagulants: Secondary | ICD-10-CM | POA: Diagnosis not present

## 2024-04-13 DIAGNOSIS — I480 Paroxysmal atrial fibrillation: Secondary | ICD-10-CM | POA: Diagnosis not present

## 2024-04-14 DIAGNOSIS — M19071 Primary osteoarthritis, right ankle and foot: Secondary | ICD-10-CM | POA: Diagnosis not present

## 2024-04-14 DIAGNOSIS — M79671 Pain in right foot: Secondary | ICD-10-CM | POA: Diagnosis not present

## 2024-04-14 DIAGNOSIS — M65979 Unspecified synovitis and tenosynovitis, unspecified ankle and foot: Secondary | ICD-10-CM | POA: Diagnosis not present

## 2024-04-20 DIAGNOSIS — I480 Paroxysmal atrial fibrillation: Secondary | ICD-10-CM | POA: Diagnosis not present

## 2024-05-11 DIAGNOSIS — I480 Paroxysmal atrial fibrillation: Secondary | ICD-10-CM | POA: Diagnosis not present

## 2024-05-11 DIAGNOSIS — Z7901 Long term (current) use of anticoagulants: Secondary | ICD-10-CM | POA: Diagnosis not present

## 2024-06-11 DIAGNOSIS — I480 Paroxysmal atrial fibrillation: Secondary | ICD-10-CM | POA: Diagnosis not present

## 2024-06-11 DIAGNOSIS — Z7901 Long term (current) use of anticoagulants: Secondary | ICD-10-CM | POA: Diagnosis not present

## 2024-06-24 DIAGNOSIS — I480 Paroxysmal atrial fibrillation: Secondary | ICD-10-CM | POA: Diagnosis not present

## 2024-06-24 DIAGNOSIS — Z7901 Long term (current) use of anticoagulants: Secondary | ICD-10-CM | POA: Diagnosis not present

## 2024-07-21 DIAGNOSIS — M19071 Primary osteoarthritis, right ankle and foot: Secondary | ICD-10-CM | POA: Diagnosis not present

## 2024-07-21 DIAGNOSIS — M898X9 Other specified disorders of bone, unspecified site: Secondary | ICD-10-CM | POA: Diagnosis not present

## 2024-07-21 DIAGNOSIS — M65979 Unspecified synovitis and tenosynovitis, unspecified ankle and foot: Secondary | ICD-10-CM | POA: Diagnosis not present

## 2024-07-22 DIAGNOSIS — H43813 Vitreous degeneration, bilateral: Secondary | ICD-10-CM | POA: Diagnosis not present

## 2024-07-22 DIAGNOSIS — Z7901 Long term (current) use of anticoagulants: Secondary | ICD-10-CM | POA: Diagnosis not present

## 2024-07-22 DIAGNOSIS — I480 Paroxysmal atrial fibrillation: Secondary | ICD-10-CM | POA: Diagnosis not present

## 2024-07-22 DIAGNOSIS — M3501 Sicca syndrome with keratoconjunctivitis: Secondary | ICD-10-CM | POA: Diagnosis not present

## 2024-07-22 DIAGNOSIS — H179 Unspecified corneal scar and opacity: Secondary | ICD-10-CM | POA: Diagnosis not present

## 2024-07-22 DIAGNOSIS — H35379 Puckering of macula, unspecified eye: Secondary | ICD-10-CM | POA: Diagnosis not present

## 2024-08-04 ENCOUNTER — Ambulatory Visit (INDEPENDENT_AMBULATORY_CARE_PROVIDER_SITE_OTHER): Admitting: Internal Medicine

## 2024-08-04 ENCOUNTER — Encounter: Payer: Self-pay | Admitting: Internal Medicine

## 2024-08-04 VITALS — BP 120/80 | HR 71 | Temp 98.4°F | Resp 12 | Wt 156.6 lb

## 2024-08-04 DIAGNOSIS — E785 Hyperlipidemia, unspecified: Secondary | ICD-10-CM | POA: Diagnosis not present

## 2024-08-04 DIAGNOSIS — I482 Chronic atrial fibrillation, unspecified: Secondary | ICD-10-CM | POA: Diagnosis not present

## 2024-08-04 DIAGNOSIS — Z1231 Encounter for screening mammogram for malignant neoplasm of breast: Secondary | ICD-10-CM

## 2024-08-04 DIAGNOSIS — I10 Essential (primary) hypertension: Secondary | ICD-10-CM

## 2024-08-04 DIAGNOSIS — C8599 Non-Hodgkin lymphoma, unspecified, extranodal and solid organ sites: Secondary | ICD-10-CM | POA: Diagnosis not present

## 2024-08-04 DIAGNOSIS — I420 Dilated cardiomyopathy: Secondary | ICD-10-CM | POA: Diagnosis not present

## 2024-08-04 DIAGNOSIS — R7301 Impaired fasting glucose: Secondary | ICD-10-CM | POA: Diagnosis not present

## 2024-08-04 DIAGNOSIS — Z79899 Other long term (current) drug therapy: Secondary | ICD-10-CM

## 2024-08-04 LAB — TSH: TSH: 2.33 u[IU]/mL (ref 0.35–5.50)

## 2024-08-04 LAB — HEMOGLOBIN A1C: Hgb A1c MFr Bld: 5.9 % (ref 4.6–6.5)

## 2024-08-04 NOTE — Progress Notes (Unsigned)
 Subjective:  Patient ID: Carol [name], female    DOB: 1941/02/23  Age: 83 y.o. MRN: [id number]  CC: The primary encounter diagnosis was Benign essential HTN. Diagnoses of Hyperlipidemia, unspecified hyperlipidemia type, Impaired fasting glucose, and Long-term use of high-risk medication were also pertinent to this visit.   HPI Carol [name] presents for  Chief Complaint  Patient presents with   Medical Management of Chronic Issues    6 month follow up    1) PAF; taking 150 mg metoprolol  in am and 50 mg at night.  Using 15 mg cardizem  at night (1/2 of 30 mg tablet)   2) right foot arthritis pain : seeing Neill.  Had repeat plantar strapping  and new insoles , .  Staying active physically.  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     amiodarone  (PACERONE ) 200 MG tablet Take 200 mg by mouth daily.     b complex vitamins capsule Take 1 capsule by mouth daily.     bisacodyl  (DULCOLAX) 5 MG EC tablet Take 5 mg by mouth daily as needed for moderate constipation.     Cholecalciferol  (VITAMIN D ) 50 MCG (2000 UT) tablet Take 2,000 Units by mouth daily.     diltiazem  (CARDIZEM  LA) 240 MG 24 hr tablet Take 240 mg by mouth daily.     diltiazem  (CARDIZEM ) 60 MG tablet Take 60 mg by mouth 4 (four) times daily. Takes 1/ 2 daily prn     diphenhydrAMINE  (BENADRYL ) 25 mg capsule Take 25 mg by mouth every 6 (six) hours as needed for allergies.     docusate sodium  (COLACE) 100 MG  capsule Take 100 mg by mouth daily as needed for mild constipation.     furosemide  (LASIX ) 20 MG tablet Take 20 mg by mouth 2 (two) times daily.     Glucosamine-Chondroit-Vit C-Mn (GLUCOSAMINE 1500 COMPLEX PO) Take 1 tablet by mouth 2 (two) times daily.     Glycerin -Polysorbate 80 (REFRESH DRY EYE THERAPY OP) Place 1 drop into both eyes 2 (two) times daily as needed (dry eyes).     guaiFENesin -codeine  100-10 MG/5ML syrup TAKE 10 ML BY MOUTH  TWICE DAILY AS NEEDED FOR COUGH 140 mL 0   MAGNESIUM  CITRATE PO Take 400 mg by mouth 2 (two) times daily.     Melatonin 5 MG CAPS Take 5 mg by mouth at bedtime as needed (sleep).     metoprolol  succinate (TOPROL -XL) 50 MG 24 hr tablet Take 50-100 mg by mouth See admin instructions. Take 100 mg in the morning and 50 mg at night     Multiple Vitamin (MULTIVITAMIN WITH MINERALS) TABS tablet Take 0.5 tablets by mouth 2 (two) times daily.     mupirocin ointment (BACTROBAN) 2 % Apply 1 Application topically 3 (three) times daily.     Omega-3 Fatty Acids (FISH OIL ) 1000 MG CAPS Take 2,000 mg by mouth. Takes two by mouth twice a day  omeprazole  (PRILOSEC) 20 MG capsule Take 1 capsule by mouth once daily 90 capsule 3   rosuvastatin  (CRESTOR ) 5 MG tablet Take 5 mg by mouth 2 (two) times a week. Mondays and Thursdays     vitamin E  1000 UNIT capsule Take 1,000 Units by mouth 2 (two) times daily.     warfarin (COUMADIN ) 4 MG tablet Take 4 mg by mouth See admin instructions. Take 4 mg daily on Monday, Wednesday and Friday     warfarin (COUMADIN ) 5 MG tablet Take 5 mg by mouth 2 (two) times a week. Take 5 mg by mouth daily on Sunday, Tuesday, Thursday and Saturday     XIIDRA 5 % SOLN Apply 1 drop to eye 2 (two) times daily.     gabapentin  (NEURONTIN ) 100 MG capsule Take 2 capsules (200 mg total) by mouth 2 (two) times daily. Patient taking 100 mg in the morning and 200 mg at night.  Home med. (Patient not taking: Reported on 08/04/2024) 360 capsule 1   No  facility-administered medications prior to visit.    Review of Systems;  Patient denies headache, fevers, malaise, unintentional weight loss, skin rash, eye pain, sinus congestion and sinus pain, sore throat, dysphagia,  hemoptysis , cough, dyspnea, wheezing, chest pain, palpitations, orthopnea, edema, abdominal pain, nausea, melena, diarrhea, constipation, flank pain, dysuria, hematuria, urinary  Frequency, nocturia, numbness, tingling, seizures,  Focal weakness, Loss of consciousness,  Tremor, insomnia, depression, anxiety, and suicidal ideation.      Objective:  There were no vitals taken for this visit.  BP Readings from Last 3 Encounters:  02/03/24 138/70  08/04/23 128/74  12/16/22 118/80    Wt Readings from Last 3 Encounters:  02/03/24 167 lb 3.2 oz (75.8 kg)  10/10/23 161 lb (73 kg)  08/04/23 166 lb 6.4 oz (75.5 kg)    Physical Exam  Lab Results  Component Value Date   HGBA1C 5.7 08/04/2023   HGBA1C 5.7 (H) 10/08/2021   HGBA1C 5.7 (H) 10/07/2021    Lab Results  Component Value Date   CREATININE 0.94 02/03/2024   CREATININE 0.85 08/04/2023   CREATININE 0.82 11/08/2022    Lab Results  Component Value Date   WBC 6.9 02/03/2024   HGB 13.5 02/03/2024   HCT 40.0 02/03/2024   PLT 354.0 02/03/2024   GLUCOSE 96 02/03/2024   CHOL 186 02/03/2024   TRIG 189.0 (H) 02/03/2024   HDL 53.80 02/03/2024   LDLDIRECT 111.0 02/03/2024   LDLCALC 94 02/03/2024   ALT 20 02/03/2024   AST 22 02/03/2024   NA 132 (L) 02/03/2024   K 4.4 02/03/2024   CL 97 02/03/2024   CREATININE 0.94 02/03/2024   BUN 16 02/03/2024   CO2 27 02/03/2024   TSH 2.35 08/04/2023   INR 3.7 (H) 12/19/2022   HGBA1C 5.7 08/04/2023   MICROALBUR -0.1 (L) 02/03/2024    US  BREAST LTD UNI RIGHT INC AXILLA Result Date: 04/23/2022 CLINICAL DATA:  83 year old presenting with burning pain in the RIGHT breast. In May, 2023 she had a melanoma removed from the RIGHT breast and developed cellulitis and a  superficial abscess thereafter for which she underwent IV antibiotic therapy. She presents now with worsening pain. She was given a prescription for Augmentin  3 days ago. Personal history of malignant RIGHT breast lumpectomy in July, 2015 at [facility]. EXAM: ULTRASOUND OF THE RIGHT BREAST COMPARISON:  RIGHT breast ultrasound 05/18/2014 at the time of breast cancer diagnosis. No interval breast imaging at [facility]. She states that  she receives her breast imaging at [facility]. FINDINGS: Targeted ultrasound is performed, showing a heterogeneous mixed solid and fluid collection in the superficial tissues at the 11:30 o'clock position 6 cm from the nipple measuring approximately 2.6 x 1.6 x 2.2 cm, demonstrating posterior acoustic enhancement and mild hyperemia on power Doppler flow. There is skin thickening/edema and there is edema in the surrounding breast tissues. IMPRESSION: Approximate 2.6 cm phlegmon in the superficial tissues of the RIGHT breast at the 11:30 o'clock position 6 cm from the nipple. This has not yet liquified into an abscess and is not a drainable collection. RECOMMENDATION: 1. Antibiotic therapy. 2. If symptoms persist, follow-up RIGHT breast ultrasound in 2 weeks after completion of antibiotic therapy. 3. Annual mammography for which the patient currently goes to [facility]. BI-RADS CATEGORY  2: Benign. Electronically Signed   By: Debby Satterfield M.D.   On: 04/23/2022 13:28    Assessment & Plan:  .Benign essential HTN  Hyperlipidemia, unspecified hyperlipidemia type  Impaired fasting glucose  Long-term use of high-risk medication     I spent 34 minutes on the day of this face to face encounter reviewing patient's  most recent visit with cardiology,  nephrology,  and neurology,  prior relevant surgical and non surgical procedures, recent  labs and imaging studies, counseling on weight management,  reviewing the assessment and plan with patient, and post visit ordering and  reviewing of  diagnostics and therapeutics with patient  .   Follow-up: No follow-ups on file.   Verneita LITTIE Kettering, MD

## 2024-08-04 NOTE — Patient Instructions (Addendum)
  You can take 3000 mg of acetominophen (tylenol ) every day safely  In divided doses ( 1000 mg every 8 hours.)   I have ordered your mammogram to be done at [facility]  If you are not contacted in 2 weeks  let me know.   YOUR BLOOD PRESSURE was mildy elevated  TODAY  It should be  130/80 or less.  Please check your blood pressure a few times at home and send me  5 readings so I can determine if you need to increase your MEDICATION.

## 2024-08-05 LAB — COMPREHENSIVE METABOLIC PANEL WITH GFR
ALT: 19 U/L (ref 0–35)
AST: 21 U/L (ref 0–37)
Albumin: 4.1 g/dL (ref 3.5–5.2)
Alkaline Phosphatase: 55 U/L (ref 39–117)
BUN: 12 mg/dL (ref 6–23)
CO2: 29 meq/L (ref 19–32)
Calcium: 9.1 mg/dL (ref 8.4–10.5)
Chloride: 97 meq/L (ref 96–112)
Creatinine, Ser: 0.88 mg/dL (ref 0.40–1.20)
GFR: 60.56 mL/min (ref >60.00)
Glucose, Bld: 106 mg/dL — ABNORMAL HIGH (ref 70–99)
Potassium: 4.5 meq/L (ref 3.5–5.1)
Sodium: 132 meq/L — ABNORMAL LOW (ref 135–145)
Total Bilirubin: 0.9 mg/dL (ref 0.2–1.2)
Total Protein: 6.5 g/dL (ref 6.0–8.3)

## 2024-08-05 LAB — MICROALBUMIN / CREATININE URINE RATIO
Creatinine,U: 36.5 mg/dL
Microalb Creat Ratio: UNDETERMINED mg/g (ref 0.0–30.0)
Microalb, Ur: <0.7 mg/dL

## 2024-08-05 LAB — LIPID PANEL
Cholesterol: 182 mg/dL (ref 0–200)
HDL: 56.8 mg/dL (ref >39.00)
LDL Cholesterol: 95 mg/dL (ref 0–99)
NonHDL: 125.19
Total CHOL/HDL Ratio: 3
Triglycerides: 149 mg/dL (ref 0.0–149.0)
VLDL: 29.8 mg/dL (ref 0.0–40.0)

## 2024-08-05 LAB — LDL CHOLESTEROL, DIRECT: Direct LDL: 93 mg/dL

## 2024-08-05 NOTE — Assessment & Plan Note (Signed)
 EF imporved to > 55% by Oct 2024 ECHO ([place]).  She is asymptomatic with ADLs and light activity

## 2024-08-05 NOTE — Assessment & Plan Note (Signed)
 Managed with  150 mg metoprolol  qam and 50 mg at bedtime,  diltiazem  XR   240 mg daily amiodarone  200 mg oncw daily for PAF. Coumadin  managed by monthly INR clinic ; she has been offered a DOAC but declines bc of  conversations with her son who is a Teacher, early years/pre

## 2024-08-05 NOTE — Assessment & Plan Note (Signed)
 She has follow up with Dr Herminio to investigate persistent sinus congestion

## 2024-08-07 ENCOUNTER — Ambulatory Visit: Payer: Self-pay | Admitting: Internal Medicine

## 2024-08-11 DIAGNOSIS — J019 Acute sinusitis, unspecified: Secondary | ICD-10-CM | POA: Diagnosis not present

## 2024-08-26 DIAGNOSIS — Z7901 Long term (current) use of anticoagulants: Secondary | ICD-10-CM | POA: Diagnosis not present

## 2024-08-26 DIAGNOSIS — I480 Paroxysmal atrial fibrillation: Secondary | ICD-10-CM | POA: Diagnosis not present

## 2024-08-31 DIAGNOSIS — J019 Acute sinusitis, unspecified: Secondary | ICD-10-CM | POA: Diagnosis not present

## 2024-09-02 DIAGNOSIS — I480 Paroxysmal atrial fibrillation: Secondary | ICD-10-CM | POA: Diagnosis not present

## 2024-09-02 DIAGNOSIS — E782 Mixed hyperlipidemia: Secondary | ICD-10-CM | POA: Diagnosis not present

## 2024-09-02 DIAGNOSIS — Z79899 Other long term (current) drug therapy: Secondary | ICD-10-CM | POA: Diagnosis not present

## 2024-09-02 DIAGNOSIS — I5032 Chronic diastolic (congestive) heart failure: Secondary | ICD-10-CM | POA: Diagnosis not present

## 2024-09-02 DIAGNOSIS — I10 Essential (primary) hypertension: Secondary | ICD-10-CM | POA: Diagnosis not present

## 2024-09-09 DIAGNOSIS — I4891 Unspecified atrial fibrillation: Secondary | ICD-10-CM | POA: Diagnosis not present

## 2024-09-24 DIAGNOSIS — I480 Paroxysmal atrial fibrillation: Secondary | ICD-10-CM | POA: Diagnosis not present

## 2024-09-24 DIAGNOSIS — Z7901 Long term (current) use of anticoagulants: Secondary | ICD-10-CM | POA: Diagnosis not present

## 2024-11-19 ENCOUNTER — Ambulatory Visit (INDEPENDENT_AMBULATORY_CARE_PROVIDER_SITE_OTHER): Admitting: *Deleted

## 2024-11-19 ENCOUNTER — Telehealth: Payer: Self-pay | Admitting: *Deleted

## 2024-11-19 VITALS — Ht 66.0 in | Wt 155.0 lb

## 2024-11-19 DIAGNOSIS — Z0000 Encounter for general adult medical examination without abnormal findings: Secondary | ICD-10-CM

## 2024-11-19 DIAGNOSIS — Z1231 Encounter for screening mammogram for malignant neoplasm of breast: Secondary | ICD-10-CM

## 2024-11-19 NOTE — Progress Notes (Signed)
 "  Chief Complaint  Patient presents with   Medicare Wellness     Subjective:   Carol [name] is a 84 y.o. female who presents for a Medicare Annual Wellness Visit.  Visit info / Clinical Intake: Medicare Wellness Visit Type:: Subsequent Annual Wellness Visit Persons participating in visit and providing information:: patient Medicare Wellness Visit Mode:: Telephone If telephone:: video declined Since this visit was completed virtually, some vitals may be partially provided or unavailable. Missing vitals are due to the limitations of the virtual format.: Unable to obtain vitals - no equipment Patient Location:: Home Provider Location:: Office/Home Interpreter Needed?: No Pre-visit prep was completed: yes AWV questionnaire completed by patient prior to visit?: no Living arrangements:: lives with spouse/significant other Patient's Overall Health Status Rating: good Typical amount of pain: none Does pain affect daily life?: no Are you currently prescribed opioids?: no  Dietary Habits and Nutritional Risks How many meals a day?: 2 Eats fruit and vegetables daily?: yes Most meals are obtained by: preparing own meals In the last 2 weeks, have you had any of the following?: none Diabetic:: no  Functional Status Activities of Daily Living (to include ambulation/medication): Independent Ambulation: Independent Medication Administration: Independent Home Management (perform basic housework or laundry): Independent Manage your own finances?: yes Primary transportation is: driving Concerns about vision?: no *vision screening is required for WTM* Concerns about hearing?: no  Fall Screening Falls in the past year?: 0 Number of falls in past year: 0 Was there an injury with Fall?: 0 Fall Risk Category Calculator: 0 Patient Fall Risk Level: Low Fall Risk  Fall Risk Patient at Risk for Falls Due to: No Fall Risks Fall risk Follow up: Falls evaluation completed; Falls  prevention discussed  Home and Transportation Safety: All rugs have non-skid backing?: N/A, no rugs All stairs or steps have railings?: yes Grab bars in the bathtub or shower?: (!) no Have non-skid surface in bathtub or shower?: yes Good home lighting?: yes Regular seat belt use?: yes Hospital stays in the last year:: no  Cognitive Assessment Difficulty concentrating, remembering, or making decisions? : no Will 6CIT or Mini Cog be Completed: yes What year is it?: 0 points What month is it?: 0 points Give patient an address phrase to remember (5 components): [address] [place] About what time is it?: 0 points Count backwards from 20 to 1: 0 points Say the months of the year in reverse: 0 points Repeat the address phrase from earlier: 0 points 6 CIT Score: 0 points  Advance Directives (For Healthcare) Does Patient Have a Medical Advance Directive?: Yes Does patient want to make changes to medical advance directive?: No - Patient declined Type of Advance Directive: Healthcare Power of [place]; Living will Copy of Healthcare Power of Attorney in Chart?: No - copy requested Copy of Living Will in Chart?: No - copy requested  Reviewed/Updated  Reviewed/Updated: Reviewed All (Medical, Surgical, Family, Medications, Allergies, Care Teams, Patient Goals)    Allergies (verified) Prednisone , Pulmicort  [budesonide ], Clonidine, Lotemax [loteprednol etabonate], Morphine  and codeine , Trovan [alatrofloxacin mesylate], Zelnorm [tegaserod maleate], Ancef  [cefazolin ], and Erythromycin   Current Medications (verified) Outpatient Encounter Medications as of 11/19/2024  Medication Sig   acetaminophen  (TYLENOL ) 500 MG tablet Take 1,000 mg by mouth every 6 (six) hours as needed for moderate pain or headache.   amiodarone  (PACERONE ) 200 MG tablet Take 200 mg by mouth daily.   b complex vitamins capsule Take 1 capsule by mouth daily.   bisacodyl  (DULCOLAX) 5 MG  EC tablet Take 5 mg by mouth  daily as needed for moderate constipation.   Cholecalciferol  (VITAMIN D ) 50 MCG (2000 UT) tablet Take 2,000 Units by mouth daily.   diltiazem  (CARDIZEM  LA) 240 MG 24 hr tablet Take 240 mg by mouth daily.   diltiazem  (CARDIZEM ) 60 MG tablet Take 60 mg by mouth 4 (four) times daily. Takes 1/ 2 daily prn   diphenhydrAMINE  (BENADRYL ) 25 mg capsule Take 25 mg by mouth every 6 (six) hours as needed for allergies.   docusate sodium  (COLACE) 100 MG capsule Take 100 mg by mouth daily as needed for mild constipation.   furosemide  (LASIX ) 20 MG tablet Take 20 mg by mouth 2 (two) times daily.   Glucosamine-Chondroit-Vit C-Mn (GLUCOSAMINE 1500 COMPLEX PO) Take 1 tablet by mouth 2 (two) times daily.   Glycerin -Polysorbate 80 (REFRESH DRY EYE THERAPY OP) Place 1 drop into both eyes 2 (two) times daily as needed (dry eyes).   guaiFENesin -codeine  100-10 MG/5ML syrup TAKE 10 ML BY MOUTH  TWICE DAILY AS NEEDED FOR COUGH   MAGNESIUM  CITRATE PO Take 400 mg by mouth 2 (two) times daily.   Melatonin 5 MG CAPS Take 5 mg by mouth at bedtime as needed (sleep).   metoprolol  succinate (TOPROL -XL) 50 MG 24 hr tablet Take 50-100 mg by mouth See admin instructions. Take 100 mg in the morning and 50 mg at night   Multiple Vitamin (MULTIVITAMIN WITH MINERALS) TABS tablet Take 0.5 tablets by mouth 2 (two) times daily.   mupirocin ointment (BACTROBAN) 2 % Apply 1 Application topically 3 (three) times daily. (Patient taking differently: Apply 1 Application topically daily as needed.)   Omega-3 Fatty Acids (FISH OIL ) 1000 MG CAPS Take 2,000 mg by mouth. Takes two by mouth twice a day (Patient taking differently: Take 4,000 mg by mouth. Takes two by mouth twice a day)   omeprazole  (PRILOSEC) 20 MG capsule Take 1 capsule by mouth once daily   rosuvastatin  (CRESTOR ) 5 MG tablet Take 5 mg by mouth 2 (two) times a week. Mondays and Thursdays   vitamin E  1000 UNIT capsule Take 1,000 Units by mouth 2 (two) times daily.   warfarin (COUMADIN )  4 MG tablet Take 4 mg by mouth See admin instructions. Take 4 mg daily on Monday, Wednesday and Friday   warfarin (COUMADIN ) 5 MG tablet Take 5 mg by mouth 2 (two) times a week. Take 5 mg by mouth daily on Sunday, Tuesday, Thursday and Saturday   XIIDRA 5 % SOLN Apply 1 drop to eye 2 (two) times daily.   gabapentin  (NEURONTIN ) 100 MG capsule Take 2 capsules (200 mg total) by mouth 2 (two) times daily. Patient taking 100 mg in the morning and 200 mg at night.  Home med. (Patient not taking: Reported on 11/19/2024)   [DISCONTINUED] levalbuterol  (XOPENEX  HFA) 45 MCG/ACT inhaler Inhale 1-2 puffs into the lungs every 4 (four) hours as needed for wheezing.   No facility-administered encounter medications on file as of 11/19/2024.    History: Past Medical History:  Diagnosis Date   A-fib (HCC)    Chronic sinusitis    COVID-19 virus infection 03/04/2022   Fibrocystic breast disease    History of pneumonia 1999   Hyperlipidemia    Irritable bowel syndrome    Lichen planus    lymphoma 06/2012   Low grade B cell   Mild tricuspid insufficiency 11/2010   E[facility], [place]   Moderate mitral insufficiency 11/2010   ECHO, [place]   Past Surgical History:  Procedure Laterality Date   ABDOMINAL HYSTERECTOMY     precancerous cervix,     ABLATION OF DYSRHYTHMIC FOCUS  August 2013   [facility], Dr. Debby   BREAST SURGERY     bilateral, benign biopsies   CARDIOVERSION N/A 11/23/2020   Procedure: CARDIOVERSION;  Surgeon: Hester Wolm PARAS, MD;  Location: [facility] ORS;  Service: Cardiovascular;  Laterality: N/A;   CARDIOVERSION N/A 01/17/2021   Procedure: CARDIOVERSION;  Surgeon: Hester Wolm PARAS, MD;  Location: [facility] ORS;  Service: Cardiovascular;  Laterality: N/A;   CARDIOVERSION N/A 01/31/2021   Procedure: CARDIOVERSION;  Surgeon: Hester Wolm PARAS, MD;  Location: [facility] ORS;  Service: Cardiovascular;  Laterality: N/A;   CARDIOVERSION N/A 10/09/2021   Procedure: CARDIOVERSION;  Surgeon: Hester Wolm PARAS, MD;   Location: [facility] ORS;  Service: Cardiovascular;  Laterality: N/A;   CARDIOVERSION N/A 01/17/2022   Procedure: CARDIOVERSION;  Surgeon: Hester Wolm PARAS, MD;  Location: [facility] ORS;  Service: Cardiovascular;  Laterality: N/A;   INCISION AND DRAINAGE ABSCESS Right 04/25/2022   Procedure: INCISION AND DRAINAGE ABSCESS;  Surgeon: Lane Shope, MD;  Location: [facility] ORS;  Service: General;  Laterality: Right;   MAXILLARY SINUS LIFT  1990's   [facility]   oophorectomy     squamous cell removal Right    right leg calf area.   TEE WITHOUT CARDIOVERSION N/A 01/31/2021   Procedure: TRANSESOPHAGEAL ECHOCARDIOGRAM (TEE);  Surgeon: Hester Wolm PARAS, MD;  Location: [facility] OR[facility];  Service: Cardiovascular;  Laterality: N/A;   TEE WITHOUT CARDIOVERSION N/A 10/09/2021   Procedure: TRANSESOPHAGEAL ECHOCARDIOGRAM (TEE);  Surgeon: Hester Wolm PARAS, MD;  Location: [facility] ORS;  Service: Cardiovascular;  Laterality: N/A;   Family History  Problem Relation Age of Onset   Cancer Mother        Breast   Hyperlipidemia Father    Heart disease Father    Hypertension Father    Kidney disease Father    Cancer Maternal Grandfather        colon CA   Diverticulitis Sister    Social History   Occupational History   Not on file  Tobacco Use   Smoking status: Never   Smokeless tobacco: Never  Vaping Use   Vaping status: Not on file  Substance and Sexual Activity   Alcohol use: Yes    Alcohol/week: 4.0 standard drinks of alcohol    Types: 4 Glasses of wine per week    Comment: Occ.   Drug use: No   Sexual activity: Not Currently    Birth control/protection: Post-menopausal   Tobacco Counseling Counseling given: Not Answered  SDOH Screenings   Food Insecurity: No Food Insecurity (11/19/2024)  Housing: Low Risk (11/19/2024)  Transportation Needs: No Transportation Needs (11/19/2024)  Utilities: Not At Risk (11/19/2024)  Alcohol Screen: Low Risk (11/19/2024)  Depression (PHQ2-9): Low Risk (11/19/2024)  Financial  Resource Strain: Low Risk (11/19/2024)  Physical Activity: Insufficiently Active (11/19/2024)  Social Connections: Socially Integrated (11/19/2024)  Stress: No Stress Concern Present (11/19/2024)  Tobacco Use: Low Risk (11/19/2024)  Health Literacy: Adequate Health Literacy (11/19/2024)   See flowsheets for full screening details  Depression Screen PHQ 2 & 9 Depression Scale- Over the past 2 weeks, how often have you been bothered by any of the following problems? Little interest or pleasure in doing things: 0 Feeling down, depressed, or hopeless (PHQ Adolescent also includes...irritable): 0 PHQ-2 Total Score: 0 Trouble falling or staying asleep, or sleeping too much: 0 Feeling tired or having little energy: 0 Poor appetite or overeating (PHQ  Adolescent also includes...weight loss): 0 Feeling bad about yourself - or that you are a failure or have let yourself or your family down: 0 Trouble concentrating on things, such as reading the newspaper or watching television (PHQ Adolescent also includes...like school work): 0 Moving or speaking so slowly that other people could have noticed. Or the opposite - being so fidgety or restless that you have been moving around a lot more than usual: 0 Thoughts that you would be better off dead, or of hurting yourself in some way: 0 PHQ-9 Total Score: 0 If you checked off any problems, how difficult have these problems made it for you to do your work, take care of things at home, or get along with other people?: Not difficult at all     Goals Addressed             This Visit's Progress    Patient Stated       Enjoy family and trips             Objective:    Today's Vitals   11/19/24 1015  Weight: 155 lb (70.3 kg)  Height: 5' 6 (1.676 m)   Body mass index is 25.02 kg/m.  Hearing/Vision screen Hearing Screening - Comments:: No  issues Vision Screening - Comments:: Readers, [facility], up to date Immunizations and Health  Maintenance Health Maintenance  Topic Date Due   Zoster Vaccines- Shingrix (1 of 2) 09/05/1960   Mammogram  05/19/2015   DTaP/Tdap/Td (2 - Td or Tdap) 11/27/2021   COVID-19 Vaccine (7 - 2025-26 season) 07/05/2024   Influenza Vaccine  02/01/2025 (Originally 06/04/2024)   Medicare Annual Wellness (AWV)  11/19/2025   Pneumococcal Vaccine: 50+ Years  Completed   Bone Density Scan  Completed   Meningococcal B Vaccine  Aged Out   Colonoscopy  Discontinued   Hepatitis C Screening  Discontinued        Assessment/Plan:  This is a routine wellness examination for Cecille.  Patient Care Team: Marylynn Verneita CROME, MD as PCP - General (Internal Medicine) Hester Wolm PARAS, MD as Referring Physician (Internal Medicine) Wilburn Keller BROCKS, MD as Consulting Physician (Cardiology) Neill Boas, DPM (Podiatry)  I have personally reviewed and noted the following in the patients chart:   Medical and social history Use of alcohol, tobacco or illicit drugs  Current medications and supplements including opioid prescriptions. Functional ability and status Nutritional status Physical activity Advanced directives List of other physicians Hospitalizations, surgeries, and ER visits in previous 12 months Vitals Screenings to include cognitive, depression, and falls Referrals and appointments  No orders of the defined types were placed in this encounter.  In addition, I have reviewed and discussed with patient certain preventive protocols, quality metrics, and best practice recommendations. A written personalized care plan for preventive services as well as general preventive health recommendations were provided to patient.   Angeline Fredericks, LPN   8/83/7973   Return in 1 year (on 11/19/2025).  After Visit Summary: (MyChart) Due to this being a telephonic visit, the after visit summary with patients personalized plan was offered to patient via MyChart   Nurse Notes: Discussed the need to update tetanus  vaccine. Patient declines covid and flu vaccines. Phone note sent to PCP regarding mammogram "

## 2024-11-19 NOTE — Telephone Encounter (Signed)
 Patient is scheduled for Mammogram on February 11th at 1:40 pm.

## 2024-11-19 NOTE — Telephone Encounter (Signed)
 Performed AWV While reviewing health maintenance patient stated that she was not aware that a mammogram order had been placed at [facility].  Patient stated that she had one done there years ago and would prefer to do it local if you are okay with that.  Patient stated that she thinks many years ago she had one done at [facility].

## 2024-11-19 NOTE — Patient Instructions (Addendum)
 Carol [name],  Thank you for taking the time for your Medicare Wellness Visit. I appreciate your continued commitment to your health goals. Please review the care plan we discussed, and feel free to reach out if I can assist you further.  Please note that Annual Wellness Visits do not include a physical exam. Some assessments may be limited, especially if the visit was conducted virtually. If needed, we may recommend an in-person follow-up with your provider.  Ongoing Care Seeing your primary care provider every 3 to 6 months helps us  monitor your health and provide consistent, personalized care.  Remember to update your tetanus vaccine.  Referrals If a referral was made during today's visit and you haven't received any updates within two weeks, please contact the referred provider directly to check on the status.  Recommended Screenings:  Health Maintenance  Topic Date Due   Zoster (Shingles) Vaccine (1 of 2) 09/05/1960   Breast Cancer Screening  05/19/2015   DTaP/Tdap/Td vaccine (2 - Td or Tdap) 11/27/2021   COVID-19 Vaccine (7 - 2025-26 season) 07/05/2024   Flu Shot  02/01/2025*   Medicare Annual Wellness Visit  11/19/2025   Pneumococcal Vaccine for age over 84  Completed   Osteoporosis screening with Bone Density Scan  Completed   Meningitis B Vaccine  Aged Out   Colon Cancer Screening  Discontinued   Hepatitis C Screening  Discontinued  *Topic was postponed. The date shown is not the original due date.       11/19/2024   10:23 AM  Advanced Directives  Does Patient Have a Medical Advance Directive? Yes  Type of Estate Agent of [place];Living will  Does patient want to make changes to medical advance directive? No - Patient declined  Copy of Healthcare Power of Attorney in Chart? No - copy requested    Vision: Annual vision screenings are recommended for early detection of glaucoma, cataracts, and diabetic retinopathy. These exams can also reveal  signs of chronic conditions such as diabetes and high blood pressure.  Dental: Annual dental screenings help detect early signs of oral cancer, gum disease, and other conditions linked to overall health, including heart disease and diabetes.  Please see the attached documents for additional preventive care recommendations.

## 2024-12-15 ENCOUNTER — Encounter

## 2025-02-02 ENCOUNTER — Ambulatory Visit: Admitting: Internal Medicine

## 2025-11-21 ENCOUNTER — Ambulatory Visit
